# Patient Record
Sex: Male | Born: 1960 | Race: White | Hispanic: No | Smoking: Current every day smoker
Health system: Southern US, Community
[De-identification: ages and names within clinical notes are randomized; demographics above are authoritative.]

## PROBLEM LIST (undated history)

## (undated) DIAGNOSIS — E119 Type 2 diabetes mellitus without complications: Secondary | ICD-10-CM

## (undated) DIAGNOSIS — Z8709 Personal history of other diseases of the respiratory system: Secondary | ICD-10-CM

## (undated) DIAGNOSIS — I10 Essential (primary) hypertension: Secondary | ICD-10-CM

## (undated) DIAGNOSIS — F1910 Other psychoactive substance abuse, uncomplicated: Secondary | ICD-10-CM

## (undated) DIAGNOSIS — F1010 Alcohol abuse, uncomplicated: Secondary | ICD-10-CM

## (undated) DIAGNOSIS — L409 Psoriasis, unspecified: Secondary | ICD-10-CM

## (undated) DIAGNOSIS — F32A Depression, unspecified: Secondary | ICD-10-CM

## (undated) HISTORY — PX: OTHER SURGICAL HISTORY: SHX169

## (undated) HISTORY — DX: Other psychoactive substance abuse, uncomplicated: F19.10

## (undated) HISTORY — DX: Depression, unspecified: F32.A

## (undated) HISTORY — DX: Type 2 diabetes mellitus without complications: E11.9

## (undated) HISTORY — DX: Personal history of other diseases of the respiratory system: Z87.09

---

## 2005-04-17 ENCOUNTER — Emergency Department: Payer: Self-pay | Admitting: Emergency Medicine

## 2006-04-13 ENCOUNTER — Other Ambulatory Visit: Payer: Self-pay

## 2006-04-13 ENCOUNTER — Emergency Department: Payer: Self-pay | Admitting: Emergency Medicine

## 2007-02-18 ENCOUNTER — Inpatient Hospital Stay (HOSPITAL_COMMUNITY): Admission: EM | Admit: 2007-02-18 | Discharge: 2007-02-20 | Payer: Self-pay | Admitting: Emergency Medicine

## 2008-03-31 IMAGING — CR DG CHEST 1V PORT
1 series · 1 of 1 positions shown · non-contrast
Comparison: none

REASON FOR EXAM: Syncope  rm # [id number]
COMMENTS:

PROCEDURE:     DXR - DXR PORTABLE CHEST SINGLE VIEW  - April 13, 2006  [DATE]
RESULT:      AP view of the chest shows the lung fields to be clear.   The
heart, mediastinal and osseous structures are normal in appearance.

[view not recorded]
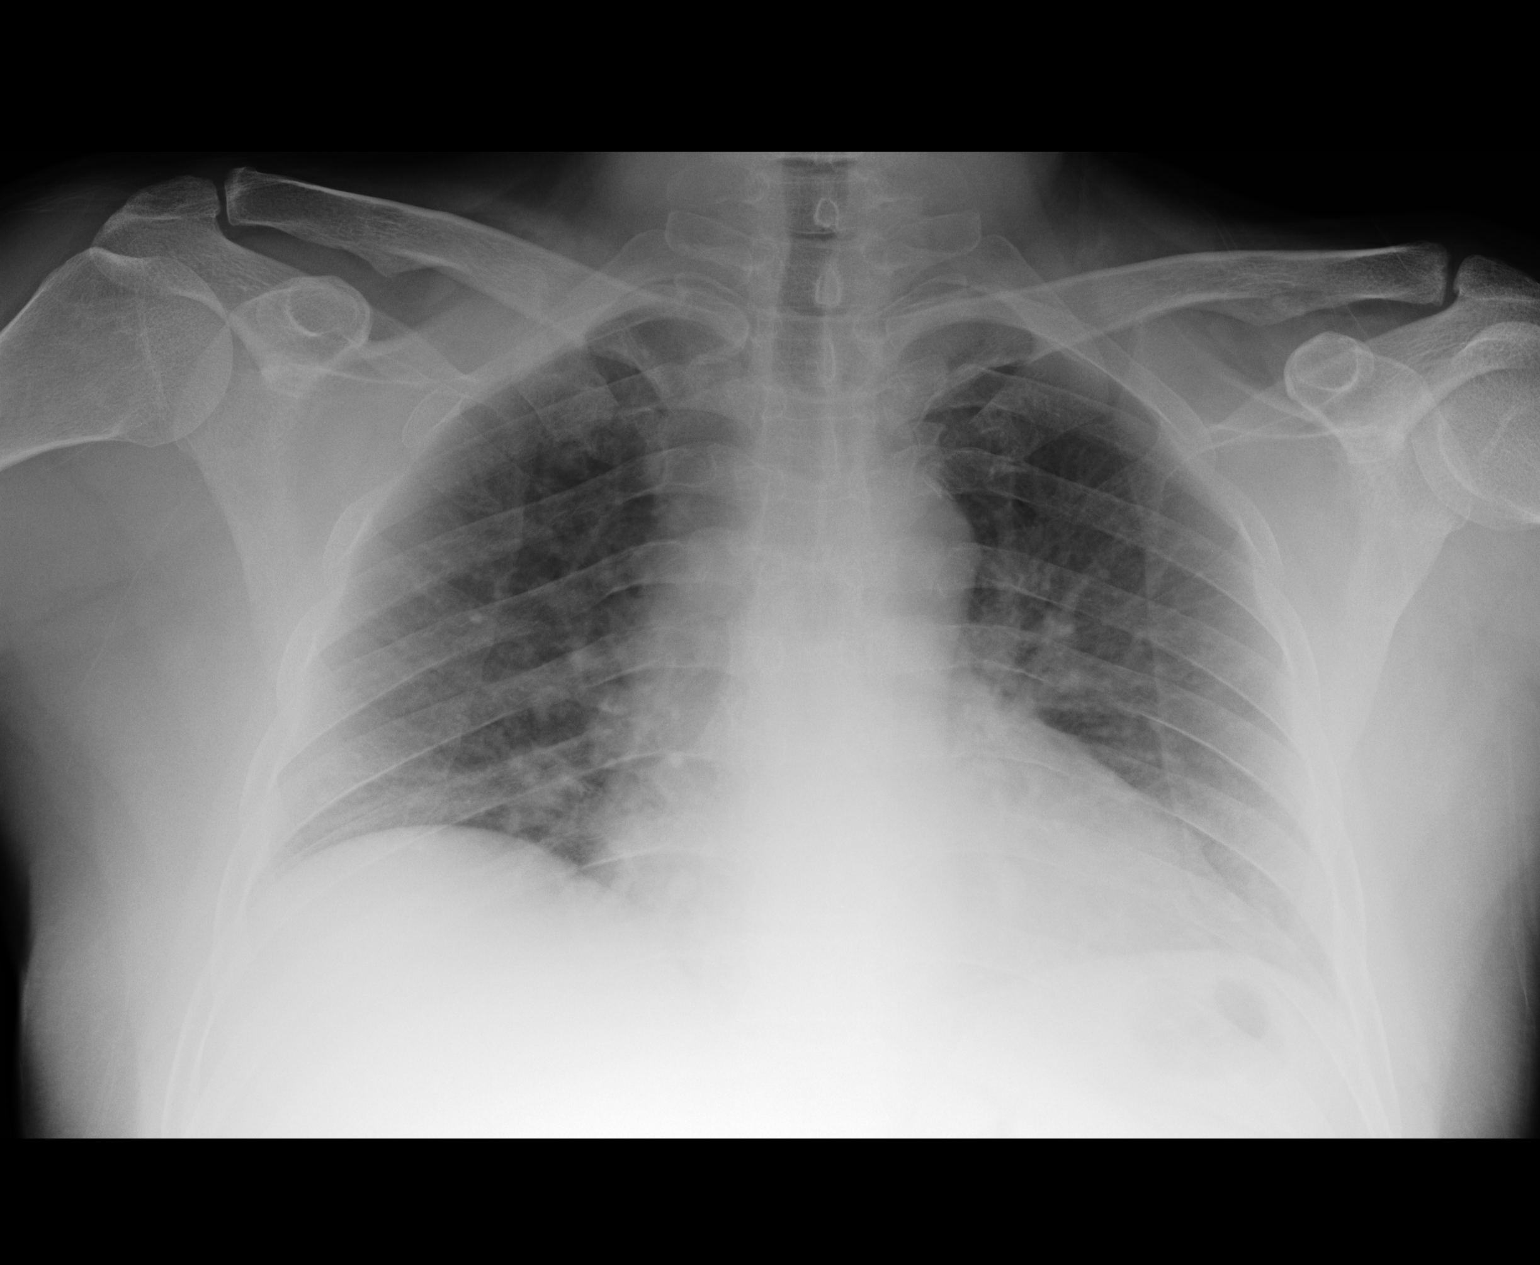

[1 of 1 positions shown; findings below may reference images not displayed]

IMPRESSION: No acute changes are identified.

## 2008-03-31 IMAGING — CT CT HEAD WITHOUT CONTRAST
2 series · 16 of 30 positions shown, 20 images · non-contrast
Comparison: none

REASON FOR EXAM: Syncope
COMMENTS:

[Series 2: without · axial · non-contrast · 0.39mm/px · z∈[-106,+20]mm · 13 of 31 slices shown, 17 images]
[im 3/31  brain]
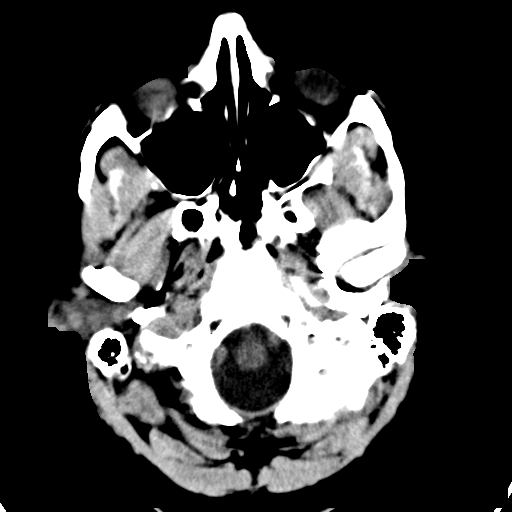
[im 3/31  bone]
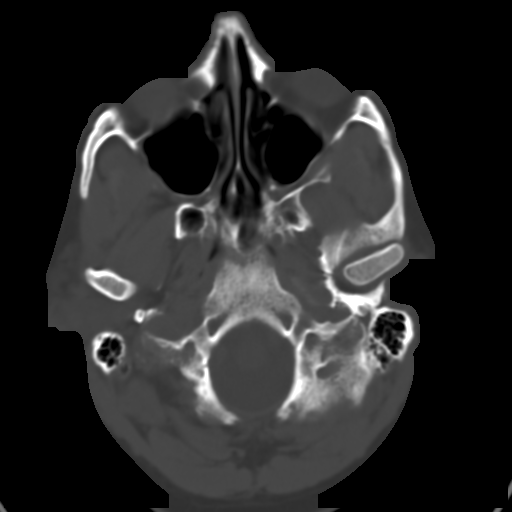
[im 5/31  brain]
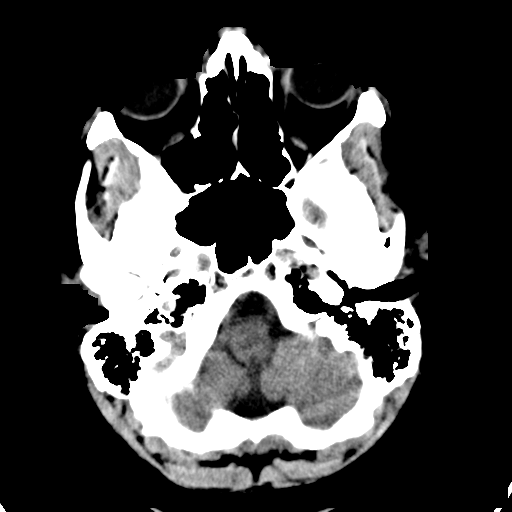
[im 7/31  brain]
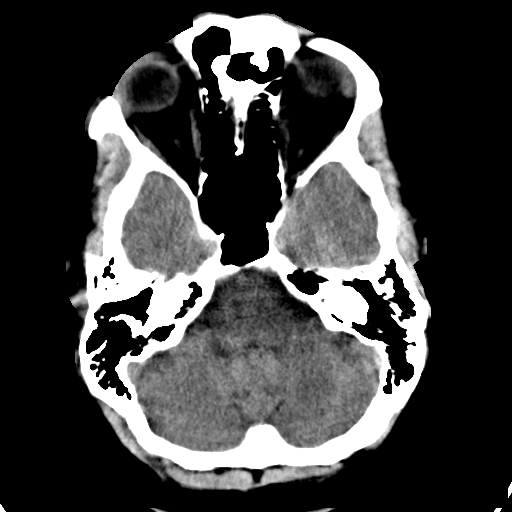
[im 9/31  brain]
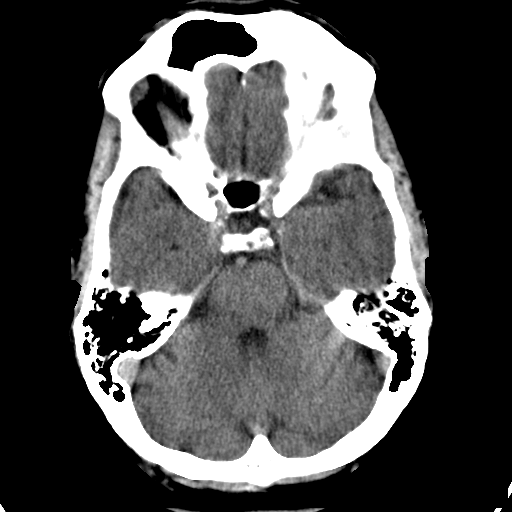
[im 11/31  brain]
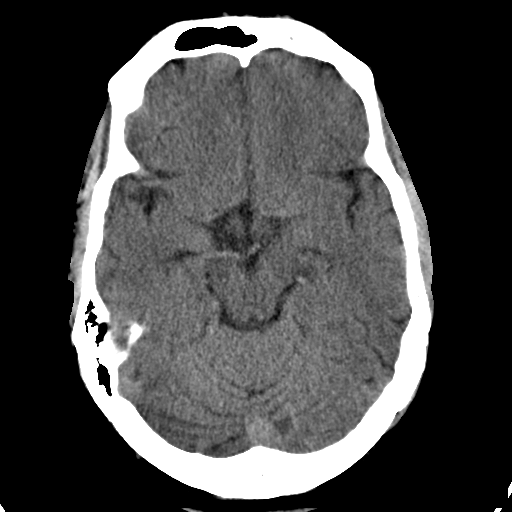
[im 11/31  bone]
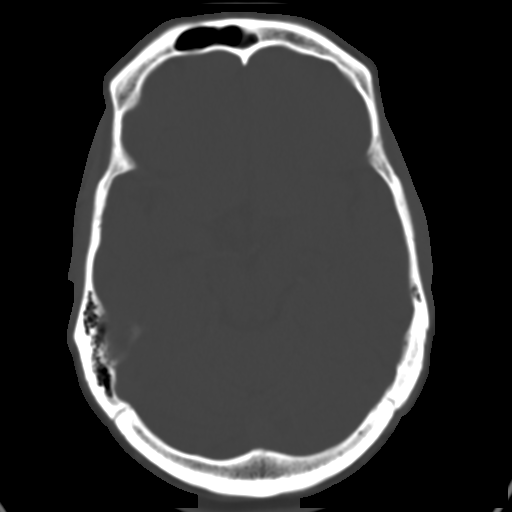
[im 13/31  brain]
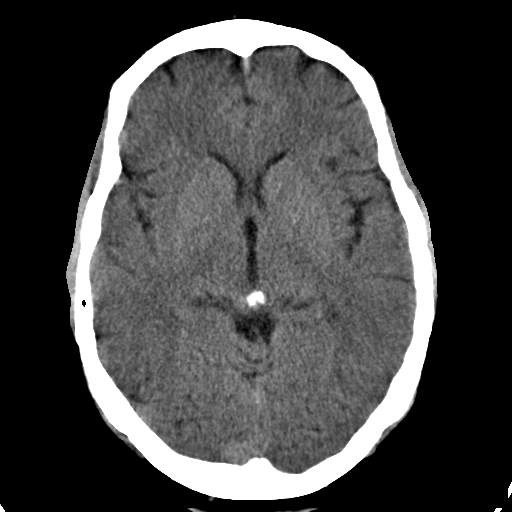
[im 16/31  brain]
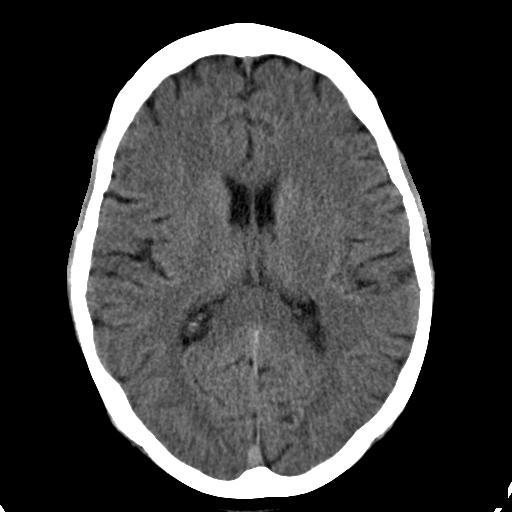
[im 18/31  brain]
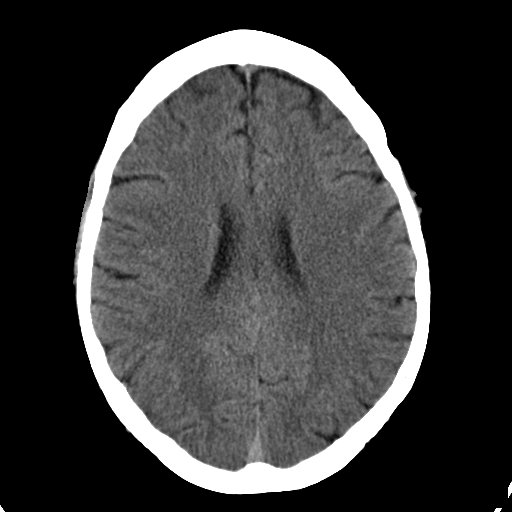
[im 20/31  brain]
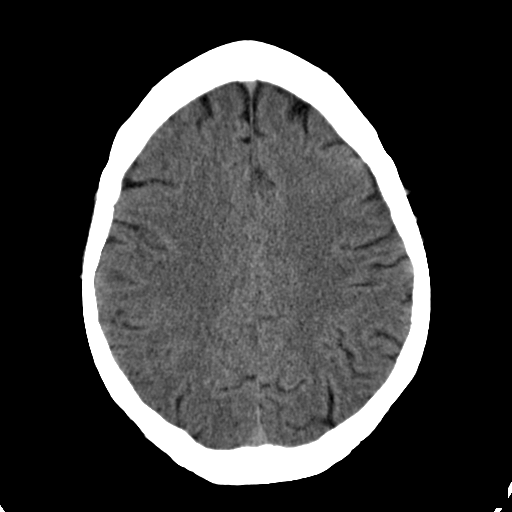
[im 20/31  bone]
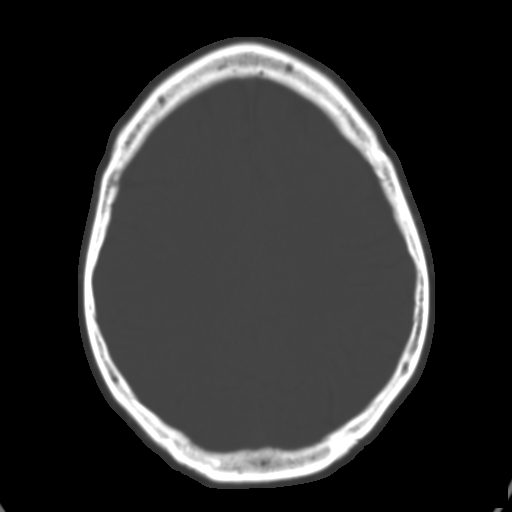
[im 22/31  brain]
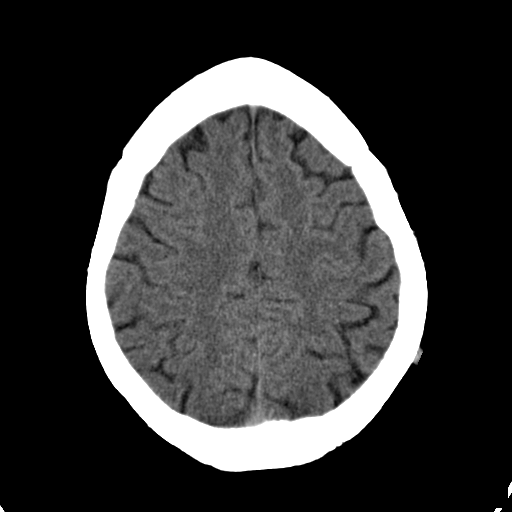
[im 24/31  brain]
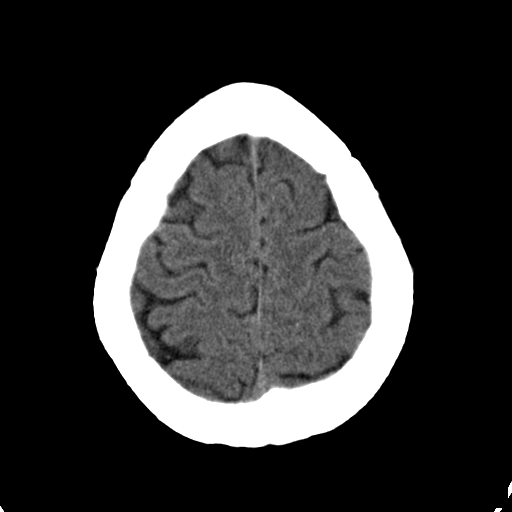
[im 26/31  brain]
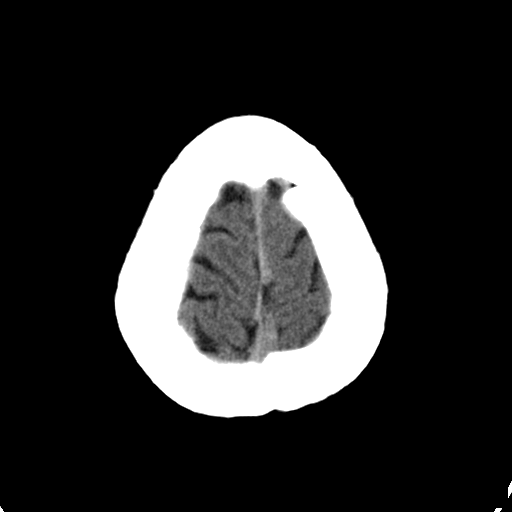
[im 28/31  brain]
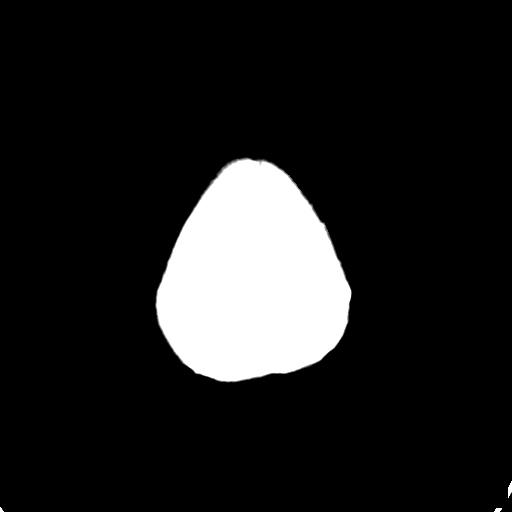
[im 28/31  bone]
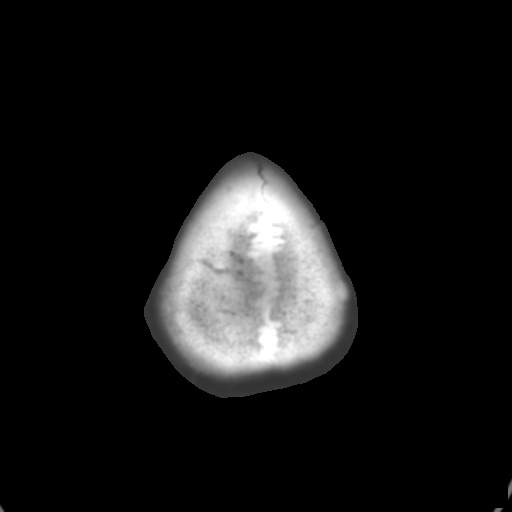

[Series 3: bone · axial · 0.39mm/px · z∈[-106,-66]mm · 3 of 31 slices shown]
[im 3/31  bone]
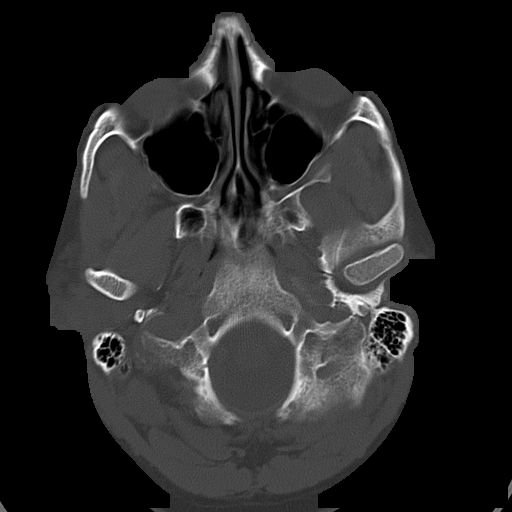
[im 7/31  bone]
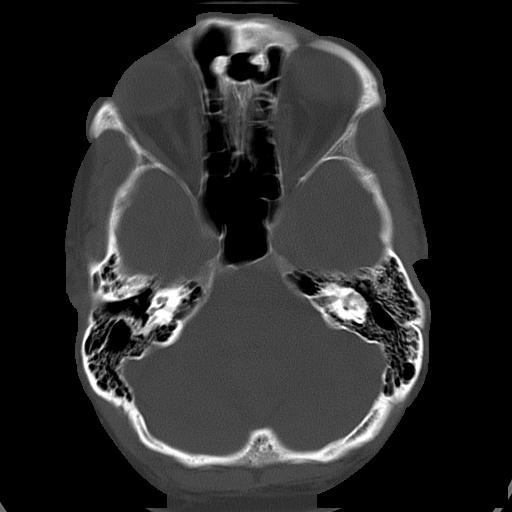
[im 11/31  bone]
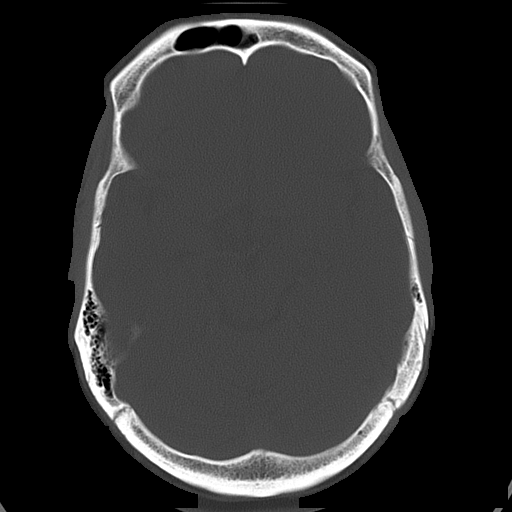

[16 of 30 positions shown; findings below may reference images not displayed]

PROCEDURE:     CT  - CT HEAD WITHOUT CONTRAST  - April 13, 2006  [DATE]

RESULT:     The patient is experiencing mental status change.

The ventricles are normal in size and position. There is no intracranial
hemorrhage, mass or mass effect. At bone window settings, I do not see
evidence of an acute skull fracture.
IMPRESSION: I see no acute intracranial abnormality.

A preliminary report was sent to the Emergency Room at the conclusion of the
study.

## 2008-12-02 ENCOUNTER — Emergency Department (HOSPITAL_COMMUNITY): Admission: EM | Admit: 2008-12-02 | Discharge: 2008-12-03 | Payer: Self-pay | Admitting: Emergency Medicine

## 2008-12-12 ENCOUNTER — Inpatient Hospital Stay (HOSPITAL_COMMUNITY): Admission: EM | Admit: 2008-12-12 | Discharge: 2008-12-14 | Payer: Self-pay | Admitting: Emergency Medicine

## 2008-12-14 ENCOUNTER — Inpatient Hospital Stay (HOSPITAL_COMMUNITY): Admission: RE | Admit: 2008-12-14 | Discharge: 2008-12-18 | Payer: Self-pay | Admitting: *Deleted

## 2008-12-14 ENCOUNTER — Ambulatory Visit: Payer: Self-pay | Admitting: *Deleted

## 2011-03-03 LAB — BASIC METABOLIC PANEL
BUN: 3 mg/dL — ABNORMAL LOW (ref 6–23)
BUN: 5 mg/dL — ABNORMAL LOW (ref 6–23)
CO2: 26 mEq/L (ref 19–32)
CO2: 26 mEq/L (ref 19–32)
CO2: 26 mEq/L (ref 19–32)
Calcium: 8 mg/dL — ABNORMAL LOW (ref 8.4–10.5)
Calcium: 8.3 mg/dL — ABNORMAL LOW (ref 8.4–10.5)
Calcium: 8.8 mg/dL (ref 8.4–10.5)
Chloride: 103 mEq/L (ref 96–112)
Chloride: 105 mEq/L (ref 96–112)
Chloride: 108 mEq/L (ref 96–112)
Chloride: 108 mEq/L (ref 96–112)
Creatinine, Ser: 0.65 mg/dL (ref 0.4–1.5)
Creatinine, Ser: 0.75 mg/dL (ref 0.4–1.5)
Creatinine, Ser: 0.76 mg/dL (ref 0.4–1.5)
Creatinine, Ser: 0.77 mg/dL (ref 0.4–1.5)
GFR calc Af Amer: >60 mL/min (ref >60)
GFR calc Af Amer: >60 mL/min (ref >60)
GFR calc Af Amer: >60 mL/min (ref >60)
GFR calc non Af Amer: >60 mL/min (ref >60)
Glucose, Bld: 75 mg/dL (ref 70–99)
Potassium: 3.3 mEq/L — ABNORMAL LOW (ref 3.5–5.1)
Potassium: 3.7 mEq/L (ref 3.5–5.1)
Sodium: 138 mEq/L (ref 135–145)
Sodium: 139 mEq/L (ref 135–145)
Sodium: 141 mEq/L (ref 135–145)
Sodium: 142 mEq/L (ref 135–145)

## 2011-03-03 LAB — GLUCOSE, CAPILLARY
Glucose-Capillary: 111 mg/dL — ABNORMAL HIGH (ref 70–99)
Glucose-Capillary: 112 mg/dL — ABNORMAL HIGH (ref 70–99)
Glucose-Capillary: 116 mg/dL — ABNORMAL HIGH (ref 70–99)
Glucose-Capillary: 120 mg/dL — ABNORMAL HIGH (ref 70–99)
Glucose-Capillary: 127 mg/dL — ABNORMAL HIGH (ref 70–99)
Glucose-Capillary: 133 mg/dL — ABNORMAL HIGH (ref 70–99)
Glucose-Capillary: 151 mg/dL — ABNORMAL HIGH (ref 70–99)
Glucose-Capillary: 174 mg/dL — ABNORMAL HIGH (ref 70–99)
Glucose-Capillary: 183 mg/dL — ABNORMAL HIGH (ref 70–99)
Glucose-Capillary: 58 mg/dL — ABNORMAL LOW (ref 70–99)
Glucose-Capillary: 62 mg/dL — ABNORMAL LOW (ref 70–99)
Glucose-Capillary: 63 mg/dL — ABNORMAL LOW (ref 70–99)
Glucose-Capillary: 63 mg/dL — ABNORMAL LOW (ref 70–99)
Glucose-Capillary: 64 mg/dL — ABNORMAL LOW (ref 70–99)
Glucose-Capillary: 65 mg/dL — ABNORMAL LOW (ref 70–99)
Glucose-Capillary: 70 mg/dL (ref 70–99)
Glucose-Capillary: 71 mg/dL (ref 70–99)
Glucose-Capillary: 72 mg/dL (ref 70–99)
Glucose-Capillary: 73 mg/dL (ref 70–99)
Glucose-Capillary: 77 mg/dL (ref 70–99)
Glucose-Capillary: 77 mg/dL (ref 70–99)
Glucose-Capillary: 79 mg/dL (ref 70–99)
Glucose-Capillary: 80 mg/dL (ref 70–99)
Glucose-Capillary: 80 mg/dL (ref 70–99)
Glucose-Capillary: 82 mg/dL (ref 70–99)
Glucose-Capillary: 85 mg/dL (ref 70–99)
Glucose-Capillary: 90 mg/dL (ref 70–99)
Glucose-Capillary: 93 mg/dL (ref 70–99)
Glucose-Capillary: 94 mg/dL (ref 70–99)
Glucose-Capillary: 95 mg/dL (ref 70–99)
Glucose-Capillary: 96 mg/dL (ref 70–99)
Glucose-Capillary: 96 mg/dL (ref 70–99)
Glucose-Capillary: 98 mg/dL (ref 70–99)

## 2011-03-03 LAB — SALICYLATE LEVEL: Salicylate Lvl: <4 mg/dL (ref 2.8–20.0)

## 2011-03-03 LAB — DIFFERENTIAL
Basophils Absolute: 0 10*3/uL (ref 0.0–0.1)
Eosinophils Absolute: 0.1 10*3/uL (ref 0.0–0.7)
Eosinophils Relative: 1 % (ref 0–5)
Lymphs Abs: 2.1 10*3/uL (ref 0.7–4.0)

## 2011-03-03 LAB — CBC
Hemoglobin: 11.7 g/dL — ABNORMAL LOW (ref 13.0–17.0)
RBC: 3.68 MIL/uL — ABNORMAL LOW (ref 4.22–5.81)
RBC: 4.23 MIL/uL (ref 4.22–5.81)
WBC: 12.5 10*3/uL — ABNORMAL HIGH (ref 4.0–10.5)
WBC: 9.8 10*3/uL (ref 4.0–10.5)

## 2011-03-03 LAB — COMPREHENSIVE METABOLIC PANEL
ALT: 35 U/L (ref 0–53)
Alkaline Phosphatase: 57 U/L (ref 39–117)
CO2: 25 mEq/L (ref 19–32)
Chloride: 110 mEq/L (ref 96–112)
GFR calc non Af Amer: >60 mL/min (ref >60)
Glucose, Bld: 78 mg/dL (ref 70–99)
Potassium: 3.2 mEq/L — ABNORMAL LOW (ref 3.5–5.1)
Sodium: 145 mEq/L (ref 135–145)

## 2011-03-03 LAB — HEPATIC FUNCTION PANEL
ALT: 31 U/L (ref 0–53)
AST: 18 U/L (ref 0–37)
Albumin: 3.7 g/dL (ref 3.5–5.2)
Bilirubin, Direct: 0.1 mg/dL (ref 0.0–0.3)
Total Bilirubin: 0.7 mg/dL (ref 0.3–1.2)

## 2011-03-03 LAB — RAPID URINE DRUG SCREEN, HOSP PERFORMED
Cocaine: NOT DETECTED
Tetrahydrocannabinol: NOT DETECTED

## 2011-03-03 LAB — ACETAMINOPHEN LEVEL

## 2011-04-01 NOTE — H&P (Signed)
NAME:  SALEM, LEMBKE NO.:  [id number]   MEDICAL RECORD NO.:  [id number]          PATIENT TYPE:  IPS   LOCATION:  0302                          [facility]ACILITY[facility]  [facility]   PHYSICIAN:  Jasmine Pang, M.D. DATE OF BIRTH:  1961-02-07   DATE OF ADMISSION:  12/14/2008  DATE OF DISCHARGE:                       PSYCHIATRIC ADMISSION ASSESSMENT   This is a 50 year old divorced male voluntarily admitted on December 14, 2008.   HISTORY OF PRESENT ILLNESS:  The patient is transferred from [facility] after the patient had an intentional overdose, injected himself  with insulin and alcohol use.  The patient was hospitalized for  approximately 2-3 days medically stable and again is here for assessment  of his overdose.  He denies that it was a suicide attempt.  He was  hoping to use his mother's insulin to help with withdrawal symptoms  after stopping alcohol.  He reports that he is a binge drinker, goes  long periods of time without drinking then he will drink up to two to  three fifths at a time, drinking approximately three fifths on Sunday  prior to this admission.  He has significant stressors.  He is facing  possible jail time after his third DUI.  He denies any suicidal  ideation, denies any hallucinations.  Wants to get things in order if he  does have to go to jail for his elderly parents.   PAST PSYCHIATRIC HISTORY:  First admission to [facility]  Has been in treatment centers before.  Was on Paxil back in 1995 but had  problems with night sweating.   SOCIAL HISTORY:  He is a 50 year old divorced male.  He lives in  [place].  He is unemployed for the past 2 years and lives with his  parents.   FAMILY HISTORY:  Is none.   ALCOHOL AND DRUG HISTORY:  The patient smokes cigarettes.  Again, he  binge drinks up to two to three fifths at a time, can drink for hours on  end.  History of withdrawal symptoms.  Denies any seizure activity and  denies  any other recreational drug use.   PRIMARY CARE Jmichael Gille:  None.   MEDICAL PROBLEMS:  History of psoriasis.   MEDICATIONS:  None.   DRUG ALLERGIES:  No known allergies.   PHYSICAL EXAMINATION:  GENERAL:  This appears to be a well-nourished,  middle-aged male.  No noted tremors.  He does have bruising to his  antecubital area which he states is from IVs and blood draws.  Also some  bruising noted to his wrist when the patient was placed in handcuffs.  MENTAL STATUS EXAM:  He is fully alert.  He is cooperative.  He has fair  eye contact.  His speech is clear, normal pace and tone.  The patient's  mood is neutral.  He does appear somewhat sad and depressed.  Thought  processes are coherent.  No evidence of any delusional thinking.  He is  goal directed.  Cognitive function intact.  His memory appears to be  good.  His judgment is fair.  Insight is minimal.  AXIS I:  Mood disorder.  Alcohol dependence.  AXIS II:  Deferred.  AXIS III:  Psoriasis.  AXIS IV:  Problems related to legal system and other psychosocial  problems.  AXIS V:  Current is 35-40.   PLANS:  Contract for safety.  Will have Librium protocol available for  withdrawal symptoms.  Will continue to identify his co-morbidities.  We  will also obtain a liver function.  The patient at this time is  reluctant to begin any other medications and feels that they are not the  answer.  We will also contact his support group for concerns and  support.  Will have trazodone available for sleep.  The patient will be  in the red group.  His tentative length of stay at this time is 3-5  days.      Landry Corporal, N.P.      Jasmine Pang, M.D.  Electronically Signed    JO/MEDQ  D:  12/15/2008  T:  12/15/2008  Job:  [id number]

## 2011-04-01 NOTE — H&P (Signed)
NAME:  Jeffrey Black, Jeffrey Black NO.:  [id number]   MEDICAL RECORD NO.:  [id number]          PATIENT TYPE:  INP   LOCATION:  1233                         FACILITY:  [facility]   PHYSICIAN:  Della Goo, M.D. DATE OF BIRTH:  27-Apr-1961   DATE OF ADMISSION:  12/12/2008  DATE OF DISCHARGE:                              HISTORY & PHYSICAL   CHIEF COMPLAINT:  Overdose.   HISTORY OF PRESENT ILLNESS:  This is a 50 year old male who was brought  emergently to the hospital after being found unresponsive by his mother  with several insulin files which were empty along side of him.[facility] was  called and they administered IV dextrose and brought the patient to the  hospital for admission and treatment.  The patient was alert and  oriented in the emergency department and on his intake interview, he  stated that he was trying to kill himself, he wanted to die, and God is  trying to punish him because God will not let him die.  The patient also  reports drinking heavily as well.  The type of insulin which had been  recovered from the scene was Lantus insulin.  The patient's blood sugars  were found to be 62, then 68 after administration of IV dextrose and  then 189.  The patient was started on D10 IV fluids and blood glucose  levels have been checked serially.   PAST MEDICAL HISTORY:  1. History of alcohol abuse.  2. History of psoriasis.  3. Tobacco abuse.   MEDICATIONS:  At this time, none.   ALLERGIES:  NO KNOWN DRUG ALLERGIES.   SOCIAL HISTORY:  The patient is divorced.  He lives with his parents.  He is also unemployed.  He is a heavy drinker and heavy smoker and he  denies any illicit drug usage.   FAMILY HISTORY:  Unable to obtain.   REVIEW OF SYSTEMS:  Negative except for symptoms of severe depression.   PHYSICAL EXAMINATION FINDINGS:  GENERAL:  This is a severely agitated  and belligerent 50 year old male who is currently in no acute distress.  VITAL SIGNS:   Temperature 97.0, blood pressure 144/96, heart rate 108,  respirations 20.  HEENT:  Normocephalic, atraumatic.  There is no scleral icterus or  conjunctival pallor or injection.  Pupils are equally round reactive to  light.  Extraocular movements are intact.  Funduscopic benign.  Nares  are patent bilaterally.  Oropharynx is clear.  NECK:  Supple full range of motion.  No thyromegaly, adenopathy or  jugular venous distention.  CARDIOVASCULAR:  Tachycardiac rate and rhythm.  No murmurs, gallops or  rubs.  LUNGS:  Clear to auscultation bilaterally.  ABDOMEN:  Positive bowel sounds, soft, nontender, nondistended.  No  hepatosplenomegaly.  EXTREMITIES:  Without cyanosis, clubbing or edema.  NEUROLOGIC:  The patient is alert and oriented x3.  There are no  deficits in his sensorium.  His cranial nerves are intact.  Motor and  sensory function are also intact.  He is able to move all fours  extremities.   LABORATORY STUDIES:  White blood cell count 12.5, hemoglobin  13.5,  hematocrit 40.4, platelets 370, neutrophils 74% lymphocytes 17%.  Sodium  145, potassium 2.2, chloride 110, bicarb 25, BUN 8, creatinine 0.76 and  glucose 78, albumin 4.1, alcohol level 379, glucose level as mentioned  above.   ASSESSMENT:  A 50 year old male being admitted with:  1. Insulin overdose.  2. Suicide attempt.  3. Mild hypokalemia.  4. Alcoholism.  5. Severe depression.   PLAN:  The patient will be admitted to the intensive care unit.  He will  continue on D10 IV and potassium repletion has been ordered as well.  The patient will be placed on suicide precautions and a psychiatric  evaluation will be requested.  The patient's alcohol level will be  rechecked and when the alcohol level is less than 5 or if he has  symptoms of agitation, the IV Ativan alcohol withdrawal protocol will be  started.  The patient will continue with glucose checks every hour for  the next 24 hours secondary to the duration of  Lantus therapy, and the  patient will also have serial chemistries performed to evaluate his  potassium levels due to his low potassium and also due to the mechanism  of insulin therapy.  Further workup will ensue pending results of the  patient's clinical course.      Della Goo, M.D.  Electronically Signed     HJ/MEDQ  D:  12/14/2008  T:  12/14/2008  Job:  [id number]

## 2011-04-01 NOTE — Consult Note (Signed)
NAME:  Jeffrey Black, Jeffrey Black NO.:  [id number]   MEDICAL RECORD NO.:  [id number]          PATIENT TYPE:  INP   LOCATION:  1233                         FACILITY:  [facility]   PHYSICIAN:  Antonietta Breach, M.D.  DATE OF BIRTH:  [birth year]   DATE OF CONSULTATION:  12/13/2008  DATE OF DISCHARGE:                                 CONSULTATION   REQUESTING PHYSICIAN:  [facility].   REASON FOR CONSULTATION:  Suicide attempt.   HISTORY OF PRESENT ILLNESS:  Mr. Jeffrey Black is a 50 year old male who has  developed over 4 weeks of progressive depressed mood, anhedonia, poor  concentration, insomnia, poor energy, and thoughts of suicide.  He has  lost his job.  He has had a breakup in a relationship.  He has been  staying with his mother and took her insulin and injected himself in  three places in order to kill himself.  He does continue with the same  depression symptoms.  He does have a history of severe binging with  alcohol.   PAST PSYCHIATRIC HISTORY:  He does have a history of prior depressive  episodes and has been treated with Paxil successfully in the past.  However, he had intolerable sweating with Paxil which exacerbated his  psoriasis.   FAMILY PSYCHIATRIC HISTORY:  None known.   SOCIAL HISTORY:  Please see the above.  [name] does have a history  of periods of alcohol withdrawal and did require treatment for  physiologic alcohol dependence in April 2008 at the [facility].  He has one biologic daughter.  He is divorced.  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  However, with the  economy, his company had to cut back and he was let go.   He regained his license but recently lost it again with a DWI.   PAST MEDICAL HISTORY:  Status post insulin intentional overdose.   MEDICATIONS:  He is not on any current psychotropics other than an  Ativan withdrawal protocol and he did present with an alcohol blood  level of 379 after a  liquor binge.  Drug screen negative.  Aspirin  negative.  Tylenol negative.   LABORATORY DATA:  SGOT 27, SGPT 35.  Head CT on January 16 showed no  acute intracranial abnormality.   REVIEW OF SYSTEMS:  Noncontributory.   MENTAL STATUS EXAM:  Jeffrey Black is alert.  His eye contact is good.  His affect is very constricted.  Mood is depressed.  He is oriented to  all spheres.  His memory is intact except for the blackout.  His speech  is normal.  Thought process is logical, coherent, goal-directed.  No  looseness of associations.  Thought content:  He acknowledges suicidal  intent.  He has thoughts of hopelessness and helplessness.  His insight  is intact.  His judgment is poor.   PHYSICAL EXAMINATION:  There are no sweats or tremors.   ASSESSMENT:  AXIS I:  293.83 mood disorder not otherwise specified, rule  out major depressive disorder, recurrent, severe.  Alcohol dependence  AXIS II:  Deferred.  AXIS III:  See past medical history.  AXIS IV:  Economic, occupational, primary support group.  AXIS V:  30.   Jeffrey Black is still at risk for suicide.   RECOMMENDATIONS:  Would proceed with the alcohol withdrawal protocol  using an Ativan taper.  Would continue thiamine 100 mg daily  indefinitely.  The undersigned will defer other psychotropic medication.  Would admit to an inpatient psychiatric hospital as soon as possible for  a dual diagnosis track.      Antonietta Breach, M.D.  Electronically Signed     JW/MEDQ  D:  12/13/2008  T:  12/14/2008  Job:  [id number]

## 2011-04-01 NOTE — Discharge Summary (Signed)
NAME:  Jeffrey Black, Jeffrey Black NO.:  [id number]   MEDICAL RECORD NO.:  [id number]          PATIENT TYPE:  IPS   LOCATION:  0302                          [facility]ACILITY:  [facility]   PHYSICIAN:  Jasmine Pang, M.D. DATE OF BIRTH:  1961/08/06   DATE OF ADMISSION:  12/14/2008  DATE OF DISCHARGE:  12/18/2008                               DISCHARGE SUMMARY   IDENTIFICATION:  This is a 50 year old divorced white male who was  admitted on a voluntary basis on December 14, 2008.   HISTORY OF PRESENT ILLNESS:  The patient was transferred from [facility] after he had an intentional overdose.  He injected himself  with insulin and alcohol use.  The patient was hospitalized for  approximately 2-3 days to medically stabilize.  He was then referred to  our unit for assessment of his mood and overdose.  He denies that it was  a suicide attempt.  He is a binge drinker.  He goes long periods of time  without drinking and will drink up to 2 to 3 fifth at a time.  He was  drinking approximately three-fifths on Sunday prior to his admission.  He has significant stressors.  He is facing possible jail time after his  third DUI.  He denies any suicidal ideation.  He denies any  hallucinations.  He wants to get things in order if he does have to go  to jail for his elderly parents.   PAST PSYCHIATRIC HISTORY:  This is the first admission to [facility].  The  patient has been in treatment centers before he was on Paxil back in  1995, but had problems with the life sweating.   FAMILY HISTORY:  None.   ALCOHOL AND DRUG HISTORY:  The patient smokes cigarettes.  Again, he  binge drinks up to 2-3 fifths at a time.  He states he can drink for  hours on end.  He has a history of withdrawal symptoms.  He denies any  seizure activity.  He denies any other recreational drug use.   MEDICAL PROBLEMS:  History of psoriasis.   MEDICATIONS:  None.   DRUG ALLERGIES:  No known drug allergies.   PHYSICAL  FINDINGS:  There were no acute physical or medical problems  noted.   HOSPITAL COURSE:  Upon admission, the patient was started on trazodone  50 mg p.o. q.h.s. p.r.n. insomnia and the Librium detox protocol.  In  individual sessions with me, the patient was friendly and cooperative.  He also participated appropriately in unit therapeutic groups and  activities.  He stated he took his mother's insulin to be knocked out  so he could get off alcohol.  He did not think it would kill him.  He  had been having thoughts crossing his mind that he might be better off  dead.  He has been on antidepressants in the past, Paxil and Effexor,  but did not like side effects.  He has been worried about his parents  who were getting frail.  He also was struggling with psoriasis.  He was  started on Neurontin 300  mg p.o. t.i.d. for anxiety and Seroquel 50 mg  p.o. q.4 h. p.r.n. anxiety.  He did not want to start any antidepressant  as he did not like the side effects even though I explained to him and  antidepressant would be helpful for his anxiety.  On December 16, 2008,  the patient's mood was good.  He denied any suicidal ideation.  He  continued to talk about his binge drinking.  He states he knows he is in  a rational when he has had too much to drink.  He says anxiety triggers  the binging.  On December 17, 2008, his suicidal ideation continued to be  resolved.  His sleep was good.  He was looking forward to possibly going  home the next day.  He met with the social worker on December 18, 2008 to  finalize his discharge plan.  The patient gave social worker a  permission to call his mother.  She had concerns about relapsing on  alcohol, but agreed to allow the patient to return home with her and she  was going to pick him up.  It was recommended that the patient  participate in 90/90 AA recovery plan and the patient was given a  current meeting schedule.  He was also advised to go to family services   for an appointment with a substance abuse counselor.  He was going for  follow up to the [facility] for his medication management.  On  December 18, 2008, mental status had improved markedly from admission  status.  Sleep was good.  Affect was good.  Mood was less depressed,  less anxious.  Affect was consistent with mood.  There is no suicidal or  homicidal ideation.  No thoughts of self-injurious behavior.  No  auditory or visual hallucinations.  No paranoia or delusions.  Thoughts  were logical and goal-directed.  Thought content, no predominant theme.  Cognitive was grossly intact.  Insight fair.  Judgment fair.  Impulse  control good.  He wants to go home today.  He is safe for discharge.   DISCHARGE DIAGNOSES:  Axis I:  Anxiety disorder, not otherwise  specified; also alcohol dependence.  Axis II:  None.  Axis III:  Psoriasis.  Axis IV:  Severe (problems related to legal system and other  psychosocial problems.  The patient has third DWI and may have to go to  prison.  Because of this, he also will lose his license).  Axis V:  Global assessment of functioning was 50 upon discharge.  GAF  was 35-40 upon admission.  GAF highest past year was 60.   DISCHARGE MEDICATIONS:  There was no specific activity level or dietary  restrictions.   POSTHOSPITAL CARE PLANS:  The patient will go to the [facility] on  February 9th at 9:30 a.m. for follow-up medication management.  He will  also go to [facility] for substance abuse counseling.  As indicated  above, he will go to [employer]  It was recommended he go to 90  meetings in 90 days and he was given a list of meetings in his area.   DISCHARGE MEDICATIONS:  1. Neurontin 300 mg three times a day.  2. Seroquel 50 mg every 40 hours as needed for anxiety.      Jasmine Pang, M.D.  Electronically Signed     BHS/MEDQ  D:  12/18/2008  T:  12/19/2008  Job:  [id number]

## 2011-04-01 NOTE — Discharge Summary (Signed)
NAME:  JULIO, ZAPPIA NO.:  [id number]   MEDICAL RECORD NO.:  [id number]          PATIENT TYPE:  INP   LOCATION:  1503                         FACILITY[facility]  [facility]   PHYSICIAN:  Charlestine Massed, MDDATE OF BIRTH:  [birth year]   DATE OF ADMISSION:  12/12/2008  DATE OF DISCHARGE:                               DISCHARGE SUMMARY   PRIMARY CARE PHYSICIAN:  Unassigned.   REASON FOR ADMISSION:  Overdose with insulin.   HOSPITAL COURSE:  A 50 year old gentleman, Mr. Harvey Matlack, who has  prior history of alcohol abuse, psoriasis and tobacco was found by his  mother unresponsive with several insulin vials empty alongside him.  EMS  was called and they gave IV Dextrose.  The patient was seen in the  emergency department.  He was stating that he was trying to kill  himself.  He wanted to die and God was trying to punish him.  He has a  history of drinking heavily.  The insulin that was recovered from the  site was Lantus insulin which has a half life of 24 to 36 hours.  Blood  sugar was found to be 62 and 68 after IV Dextrose, so the patient was  started on D10 and placed in the ICU with D10 IV Dextrose and several  blood sugar checks and was also put on suicidal precautions.  The  patient was observed in the ICU originally.  His blood sugar values have  been consistently in the normal range with IV Dextrose.  He is not a  known diabetic.  His last set of blood sugars were 139, 129, 151 and 186  and before that, the CBG testing was 139, 127, 151, 183, 193, 186, 197,  174, 112 and 96.  The above mentioned readings were from 11 o'clock  yesterday morning.  The patient was admitted in the early morning of  December 12, 2008 and since then his blood sugars were initially on the  lower side in the 60s to 80s and then started stabilizing in the lower  100s at 9 o'clock on December 13, 2008 and have stayed high until now.  The patient has not had any episodes of any hypoglycemic  attacks after  the blood sugar stabilized and his frequent blood sugar checks have been  continued so far.  The patient was seen by Psychiatry, Dr. Jeanie Sewer,  who agrees with the suicidal attempt and AXIS I diagnosis of mood  disorder, not other specified and to rule out major depressive disorder,  recurrent and severe, with alcohol dependence and he has been deemed  still at risk for suicide and needs inpatient psychiatric hospital  transfer as soon as possible.   The patient was evaluated today and he was calmly answering questions.  He did not have any symptoms of low blood sugar over the last 24 hours.  His blood sugars, as mentioned earlier, have been staying steadily high  so the patient is cleared medically to be discharged to an inpatient  psych facility to continue on Ativan protocol for alcohol detox.   DISCHARGE DIAGNOSES:  1. Suicidal attempt with  acute insulin overdose.  2. Mood disorder, not otherwise specified.  Rule out major depressive      disorder with suicidal attempt.  3. Alcohol dependence.  4. History of tobacco dependence.  5. History of psoriasis.   DISCHARGE MEDICATIONS:  1. Lorazepam/Ativan protocol for alcohol withdrawal.  2. Thiamine 100 mg p.o. daily.  3. Folic acid 1 mg p.o. daily.  4. Nicotine patch 21 mg q. 24 hourly transdermal.  5. Multivitamins 1 tablet p.o. daily.  6. Senna 2 tablets p.o. q.h.s.   DISCHARGE DISPOSITION:  The patient is discharged to an acute inpatient  psychiatric facility for further inpatient psychiatric management.   FOLLOWUP:  Patient to followup with Psychiatry as will be required at  the end of Psychiatric admission.  At the time of discharge, the patient  needs to be set up with a regular primary care physician for general  medical follow up.   Total time required is 45 minutes.      Charlestine Massed, MD  Electronically Signed     UT/MEDQ  D:  12/14/2008  T:  12/14/2008  Job:  [id number]

## 2011-04-04 NOTE — H&P (Signed)
NAME:  Jeffrey Black, Jeffrey Black NO.:  [id number]   MEDICAL RECORD NO.:  [id number]          PATIENT TYPE:  EMS   LOCATION:  ED                           FACILITY:  [facility]   PHYSICIAN:  Elliot Cousin, M.D.    DATE OF BIRTH:  1961-05-18   DATE OF ADMISSION:  02/18/2007  DATE OF DISCHARGE:                              HISTORY & PHYSICAL   PRIMARY CARE PHYSICIAN:  The patient is unassigned.   CHIEF COMPLAINT:  I need detox.   HISTORY OF PRESENT ILLNESS:  The patient is a 50 year old man with a  past medical history significant for alcohol addiction, psoriasis, and  bronchitis, who presents to the emergency department with a desire to  stop drinking.  The patient desires alcohol detoxification.  He states  that he has been binge-drinking for approximately 1-2 weeks nonstop.  He  drinks approximately 1-2 fifths of vodka per day.  He says that the  increase in drinking is a result of a relationship with his girlfriend  that has turned [place].  He has a history of alcohol detoxification many  times he says.  He has been a resident of [facility] in the past for inpatient substance abuse assistance and  detoxification.  The patient denies any illicit drug use; however, he  does smoke 1 pack of cigarettes per day.  He also has a history of  alcohol withdrawal symptoms.  He denies any known history of alcoholic  withdrawal seizures.  He says that he has a high tolerance for  medications usually used for detoxification.  He is aware of all of the  names of the alcohol withdrawal protocols including Ativan, Librium and  Valium.  He has been on Librium, Valium and Ativan at different times in  the past.   During the evaluation in the emergency department, the patient is noted  to be hypertensive with a blood pressure initially of 160/116.  He is  tachycardic with a heart rate of 129 beats per minute.  His alcohol  level is 377.  His urine drug screen is  negative.  The patient will be  admitted for further evaluation and management.   PAST MEDICAL HISTORY:  1. Alcohol abuse/alcohol addiction.  2. History of alcohol detoxification as an inpatient multiple times in      the past.  History of alcohol withdrawal symptoms.  In review of      the E-chart system, the patient has not had any admissions to the      [facility] system.  3. Psoriasis.  4. Bronchitis.  5. Tobacco abuse.   MEDICATIONS:  None.   ALLERGIES:  No known drug allergies.   SOCIAL HISTORY:  The patient is divorced.  He has 1 stepson and 1  biological daughter.  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  He plans on moving in  with his parents.  He has recently broken up with his girlfriend.  He  drinks 1-2 fifths of vodka daily.  He smokes 1 pack of cigarettes per  day.  He denies any illicit drug use.   FAMILY HISTORY:  His mother is 53 years of age and has normal-pressure  hydrocephalus.  His father is 75 years of age and had a stroke recently.   PHYSICAL EXAMINATION:  VITAL SIGNS:  Temperature 98.5, blood pressure  148/104, pulse 129, respiratory rate 20, oxygen saturation 98% on room  air.  GENERAL:  The patient is an alert 50 year old intoxicated Caucasian man  who is currently sitting up in bed in no acute distress.  HEENT:  Head is normocephalic and nontraumatic.  Pupils are equal, round  and reactive to light.  Extraocular movements are intact.  Conjunctivae  are mildly injected.  Sclerae are white.  Nasal mucosa is dry.  Oropharynx reveals mildly dry mucous membranes.  No posterior exudates  or erythema.  His breath smells strongly of alcohol.  NECK:  Supple.  No adenopathy, no thyromegaly, no bruit, no JVD.  LUNGS:  Clear to auscultation bilaterally.  HEART:  S1 and S2 with tachycardia.  ABDOMEN:  Positive bowel sounds.  Soft, nontender and non-distended.  No  hepatosplenomegaly.  No masses palpated.   EXTREMITIES:  Pedal pulses are 2+ bilaterally.  He has multiple scaly  plaques, mildly erythematous, on the pretibial areas of his legs.  No  pedal edema.  NEUROLOGIC:  The patient is alert and oriented x3, although he is  dysarthric and obviously intoxicated.  His gait is mildly unstable.  Cerebellar with finger-to-nose reveals an overshoot bilaterally.  Cranial nerves II-XII otherwise intact.  The patient follows directions  and his strength appears to be 5/5 and his sensation is grossly intact.   ADMISSION LABORATORY DATA:  EKG pending.   WBC 13.1, hemoglobin 14.7, MCV 92.2, platelets 385,000.  Sodium 140,  potassium 3.5, chloride 101, CO2 28, glucose 109, BUN 8, creatinine  0.76, calcium 8.8.  Alcohol level 377.  Urinalysis negative for  leukocyte esterase.  Urine drug screen negative.   ASSESSMENT:  1. Alcohol addiction with active intoxication:  The patient desires      detoxification.  The ACT team attempted to assess the patient;      however, the patient was unreliable and not cooperative.  His      alcohol level is 377.  2. Elevated blood pressure:  The patient's blood pressure may be      elevated secondary to alcohol abuse and/or impending withdrawal.      He has no known history of hypertension.  3. Leukocytosis:  The patient's WBC is 13.1.  He has no signs or      symptoms consistent with infection.  4. Tobacco abuse:  The patient smokes 1 pack of cigarettes per day.   PLAN:  1. The patient will be admitted for further evaluation and management.  2. We will start the IV withdrawal protocol with Ativan.  We will add      p.r.n. Valium as well.  3. Volume repletion with normal saline and potassium chloride added.  4. We will add oral potassium to avoid further decrease.  5. We will start vitamin therapy.  6. We will start Lopressor and/or Clonidine to treat his elevated      blood pressures. 7. We will assess his liver transaminases as well as a TSH and       magnesium level.  8. Psychiatry consultation.  9. Tobacco and substance abuse counseling.      Elliot Cousin, M.D.  Electronically Signed     DF/MEDQ  D:  02/18/2007  T:  02/18/2007  Job:  [id number]

## 2011-04-04 NOTE — Discharge Summary (Signed)
NAME:  Jeffrey Black, Jeffrey Black              ACCOUNT NO.:  [id number]   MEDICAL RECORD NO.:  [id number]          PATIENT TYPE:  INP   LOCATION:  1434                         FACILITY:  [facility]   PHYSICIAN:  Beckey Rutter, MD  DATE OF BIRTH:  08/03/1961   DATE OF ADMISSION:  02/17/2007  DATE OF DISCHARGE:  02/20/2007                               DISCHARGE SUMMARY   CHIEF COMPLAINT UPON ADMISSION:  He came to the ER requesting detox.   HISTORY OF PRESENT ILLNESS:  This is a 50 year old with a past medical  history significant for alcohol addiction, psoriasis and bronchitis.  He  came in with high alcohol intoxification, requesting detox.   HOSPITAL COURSE:  During the hospital course the patient was admitted to  the telemetry floor, but in less than 24 hours the patient started to  become a little shaky with abnormal vital signs -- suggestive of  autonomic hyperactivity.  The patient was then transferred to the  stepdown unit; he stayed there for only 1 day, when he became stable in  terms of autonomic activity and then transferred back to the telemetry  floor.   For the last 24 hours his vital signs remain stable.  He remained calm.  During the hospitalization the patient was seen by psychiatry, Antonietta Breach, M.D.  The patient wanted to resume alcohol detoxification  with a program as outside.  He had a history of admission to [facility] for detox purposes, but now he does not want to do that because of  financial purposes.   DISCHARGE DIAGNOSES:  1. Alcohol intoxification.  2. Alcohol withdrawal symptoms.  3. Alcohol abuse/addiction.  4. Psoriasis.  5. Bronchitis.  6. Tobacco abuse.   DISCHARGE MEDICATIONS:  1. Ativan 2 mg p.o. q.8 h p.r.n. for agitation and shakiness.  2. Atenolol 5 mg p.o. daily.   DISCHARGE CONSULTATION:  The patient was seen by psychiatry, Dr. Antonietta Breach, as discussed above.   DISCHARGE PLANNING:  The patient was encouraged to follow up with  the  rehab as an outpatient, and he has agreeable to that.  He was also  encouraged to stop smoking, and smoking cessation was discussed and  brochure was  provided to him before discharge.   The patient is stable for discharge today, and the patient is aware and  agreeable with the discharge plan.      Beckey Rutter, MD  Electronically Signed     EME/MEDQ  D:  02/20/2007  T:  02/20/2007  Job:  [id number]

## 2011-04-04 NOTE — Consult Note (Signed)
NAME:  [name], [name] NO.:  [id number]   MEDICAL RECORD NO.:  [id number]          PATIENT TYPE:  INP   LOCATION:  1434                         FACILITY:  [facility]   PHYSICIAN:  Antonietta Breach, M.D.  DATE OF BIRTH:  Nov 03, 1961   DATE OF CONSULTATION:  02/19/2007  DATE OF DISCHARGE:  02/20/2007                                 CONSULTATION   REASON FOR CONSULTATION:  Alcoholism, with alcohol physiologic  dependence and withdrawal.   HISTORY OF PRESENT ILLNESS:  Mr. Jeffrey Black is a 50 year old male  admitted to the [facility] on February 17, 2007 with alcohol  withdrawal and severe psoriasis as well as bronchitis.   He did begin to have shakes, tremors, and sweats after stopping his  alcohol.   He had been consuming for a number of weeks approximately 1-2 fifths of  vodka per day. He had been under the stress of having a breakup with a  girlfriend.   He states that this woman had a pattern of being unreliable and that the  breakup was necessary. He came to the hospital specifically for alcohol  detoxification in anticipation of getting back into his recovery  program.  He was also very disturbed by his increase in psoriasis on his  shins, a condition that exacerbate when he drinks alcohol.   Mr. Jeffrey Black does have a normal level of interest as well as hope for the  future.  He has no suicidal or homicidal ideation.  He has no delusions  or hallucinations.  His mood is within normal limits.   PAST PSYCHIATRIC HISTORY:  Mr. Jeffrey Black has no history of major  depression, mania, hallucinations, or delusions.  He has no history of  suicidal ideation or suicide attempts.  He has not been on psychotropic  medication other than that required for alcohol detox.   He does have a history of multiple alcohol detoxifications and alcohol  residential rehabilitation.  He has participated in the 12-step  community for a number of years at a time.   FAMILY PSYCHIATRIC  HISTORY:  None known.   SOCIAL HISTORY:  Mr. Jeffrey Black is divorced.  He has a stepson and a  biological daughter.   He is educated as an Art gallery manager, with a 4-year degree from [employer].  He has worked in Designer, fashion/clothing.   The patient did lose his driver's license with a DWI recently.  He plans  on moving in with his parents until he can get his license back and then  find a new job.  He does not use illegal drugs.   GENERAL MEDICAL PROBLEMS:  As above, the patient has psoriasis and acute  bronchitis.   MEDICATIONS:  The MAR is reviewed.  The patient is on the Ativan detox  protocol, as well as vitamins, including thiamine.   HE HAS NO KNOWN DRUG ALLERGIES.   WBC is 13.1, hemoglobin 14.7, platelets 385.  BUN 7, creatinine 0.78,  SGOT 29, SGPT 39, calcium 8.8, magnesium 2.4.  TSH normal at 0.806.  Urine drug screen is negative.  The alcohol level was 377  while in the  emergency room just prior to admission.   REVIEW OF SYSTEMS:  Noncontributory. The patient currently is afebrile,  with vital signs stable.  He has no and tremor, eyelid tremor, or  sweats.   MENTAL STATUS EXAM:  Mr. Jeffrey Black is an alert middle-aged male appearing  his chronologic age, in no apparent distress.  He is oriented to all  spheres.  His memory is intact to immediate, recent, and remote.  His  fund of knowledge and intelligence are above average.  His speech  involves normal rate and prosody.  His thought process is logical,  coherent, and goal-directed.  No looseness of associations.  Thought  content:  No thoughts of harming himself.  No thoughts of harming  others. No delusions, no hallucinations.  Insight is good.  Judgment is  intact   ASSESSMENT:   AXIS I:  Alcohol dependence, with physiologic dependence.   AXIS II:  Deferred.   AXIS III:  See general medical problems.   AXIS IV:  Primary support group.  Occupational.  Legal (DWI and loss of license).   AXIS V:  55.   Mr.  Jeffrey Black is not at risk to harm himself or others.  He agrees to call  emergency services for any thoughts of harming himself, thoughts of  harming others, or distress.   RECOMMENDATIONS:  1. The undersigned did recommend a residential chemical dependency      rehabilitation program.  However, the patient declined, and he is      not committable. He wants to finish his inpatient have an taper      voluntarily. He wants to follow up with Alcoholics Anonymous      meetings. He plans on living with his parents until he can get a      renewal of his driver's license and get back to work.  He states      that his parents could benefit from his presence in the house for      support and performing errands around the house.  2. Would continue his vitamins indefinitely as an outpatient.   The undersigned provided ego-supportive psychotherapy and 12-step  reinforcement.      Antonietta Breach, M.D.  Electronically Signed     JW/MEDQ  D:  02/25/2007  T:  02/26/2007  Job:  [id number]

## 2012-11-08 ENCOUNTER — Emergency Department (HOSPITAL_COMMUNITY)
Admission: EM | Admit: 2012-11-08 | Discharge: 2012-11-08 | Disposition: A | Discharge status: Home / Self Care | Payer: PRIVATE HEALTH INSURANCE | Attending: Emergency Medicine | Admitting: Emergency Medicine

## 2012-11-08 ENCOUNTER — Emergency Department (HOSPITAL_COMMUNITY): Payer: PRIVATE HEALTH INSURANCE

## 2012-11-08 ENCOUNTER — Encounter (HOSPITAL_COMMUNITY): Payer: Self-pay | Admitting: Emergency Medicine

## 2012-11-08 DIAGNOSIS — R079 Chest pain, unspecified: Secondary | ICD-10-CM

## 2012-11-08 DIAGNOSIS — Z23 Encounter for immunization: Secondary | ICD-10-CM | POA: Insufficient documentation

## 2012-11-08 DIAGNOSIS — F419 Anxiety disorder, unspecified: Secondary | ICD-10-CM

## 2012-11-08 DIAGNOSIS — R0789 Other chest pain: Secondary | ICD-10-CM | POA: Insufficient documentation

## 2012-11-08 DIAGNOSIS — R110 Nausea: Secondary | ICD-10-CM | POA: Insufficient documentation

## 2012-11-08 DIAGNOSIS — R0602 Shortness of breath: Secondary | ICD-10-CM | POA: Insufficient documentation

## 2012-11-08 DIAGNOSIS — R748 Abnormal levels of other serum enzymes: Secondary | ICD-10-CM | POA: Insufficient documentation

## 2012-11-08 DIAGNOSIS — Z872 Personal history of diseases of the skin and subcutaneous tissue: Secondary | ICD-10-CM | POA: Insufficient documentation

## 2012-11-08 DIAGNOSIS — F411 Generalized anxiety disorder: Secondary | ICD-10-CM | POA: Insufficient documentation

## 2012-11-08 DIAGNOSIS — F1010 Alcohol abuse, uncomplicated: Secondary | ICD-10-CM | POA: Insufficient documentation

## 2012-11-08 DIAGNOSIS — F17200 Nicotine dependence, unspecified, uncomplicated: Secondary | ICD-10-CM | POA: Insufficient documentation

## 2012-11-08 DIAGNOSIS — F10929 Alcohol use, unspecified with intoxication, unspecified: Secondary | ICD-10-CM

## 2012-11-08 DIAGNOSIS — K921 Melena: Secondary | ICD-10-CM | POA: Insufficient documentation

## 2012-11-08 HISTORY — DX: Alcohol abuse, uncomplicated: F10.10

## 2012-11-08 HISTORY — DX: Psoriasis, unspecified: L40.9

## 2012-11-08 LAB — COMPREHENSIVE METABOLIC PANEL
ALT: 132 U/L — ABNORMAL HIGH (ref 0–53)
AST: 94 U/L — ABNORMAL HIGH (ref 0–37)
Albumin: 3.8 g/dL (ref 3.5–5.2)
Calcium: 8.8 mg/dL (ref 8.4–10.5)
Sodium: 145 mEq/L (ref 135–145)
Total Protein: 6.9 g/dL (ref 6.0–8.3)

## 2012-11-08 LAB — CBC WITH DIFFERENTIAL/PLATELET
Basophils Absolute: 0 10*3/uL (ref 0.0–0.1)
Eosinophils Absolute: 0.1 10*3/uL (ref 0.0–0.7)
Eosinophils Relative: 2 % (ref 0–5)
Lymphocytes Relative: 13 % (ref 12–46)
MCH: 33.9 pg (ref 26.0–34.0)
MCV: 98.9 fL (ref 78.0–100.0)
Platelets: 127 10*3/uL — ABNORMAL LOW (ref 150–400)
RDW: 12.7 % (ref 11.5–15.5)
WBC: 6.8 10*3/uL (ref 4.0–10.5)

## 2012-11-08 LAB — PRO B NATRIURETIC PEPTIDE: Pro B Natriuretic peptide (BNP): 34.6 pg/mL (ref 0–125)

## 2012-11-08 LAB — OCCULT BLOOD, POC DEVICE: Fecal Occult Bld: NEGATIVE

## 2012-11-08 MED ORDER — LORAZEPAM 1 MG PO TABS
1.0000 mg | ORAL_TABLET | Freq: Four times a day (QID) | ORAL | Status: DC | PRN
  Filled 2012-11-08: qty 1

## 2012-11-08 MED ORDER — LORAZEPAM 1 MG PO TABS
1.0000 mg | ORAL_TABLET | Freq: Once | ORAL | Status: AC
  Administered 2012-11-08: 1 mg via ORAL

## 2012-11-08 MED ORDER — THIAMINE HCL 100 MG/ML IJ SOLN: 100.0000 mg | Freq: Every day | INTRAMUSCULAR | Status: DC

## 2012-11-08 MED ORDER — LORAZEPAM 1 MG PO TABS: 0.0000 mg | ORAL_TABLET | Freq: Two times a day (BID) | ORAL | Status: DC

## 2012-11-08 MED ORDER — TETANUS-DIPHTH-ACELL PERTUSSIS 5-2.5-18.5 LF-MCG/0.5 IM SUSP
0.5000 mL | Freq: Once | INTRAMUSCULAR | Status: AC
  Administered 2012-11-08: 0.5 mL via INTRAMUSCULAR
  Filled 2012-11-08: qty 0.5

## 2012-11-08 MED ORDER — VITAMIN B-1 100 MG PO TABS
100.0000 mg | ORAL_TABLET | Freq: Every day | ORAL | Status: DC
  Administered 2012-11-08: 100 mg via ORAL
  Filled 2012-11-08: qty 1

## 2012-11-08 MED ORDER — GI COCKTAIL ~~LOC~~
30.0000 mL | Freq: Once | ORAL | Status: AC
  Administered 2012-11-08: 30 mL via ORAL
  Filled 2012-11-08: qty 30

## 2012-11-08 MED ORDER — ADULT MULTIVITAMIN W/MINERALS CH
1.0000 | ORAL_TABLET | Freq: Every day | ORAL | Status: DC
  Administered 2012-11-08: 1 via ORAL
  Filled 2012-11-08: qty 1

## 2012-11-08 MED ORDER — LORAZEPAM 2 MG/ML IJ SOLN
1.0000 mg | Freq: Once | INTRAMUSCULAR | Status: AC
  Administered 2012-11-08: 1 mg via INTRAVENOUS
  Filled 2012-11-08: qty 1

## 2012-11-08 MED ORDER — LORAZEPAM 1 MG PO TABS
0.0000 mg | ORAL_TABLET | Freq: Four times a day (QID) | ORAL | Status: DC
  Administered 2012-11-08: 2 mg via ORAL
  Filled 2012-11-08: qty 2

## 2012-11-08 MED ORDER — LORAZEPAM 1 MG PO TABS: 1.0000 mg | ORAL_TABLET | Freq: Three times a day (TID) | ORAL | Status: DC | PRN

## 2012-11-08 MED ORDER — LORAZEPAM 2 MG/ML IJ SOLN: 1.0000 mg | Freq: Four times a day (QID) | INTRAMUSCULAR | Status: DC | PRN

## 2012-11-08 MED ORDER — FOLIC ACID 1 MG PO TABS
1.0000 mg | ORAL_TABLET | Freq: Every day | ORAL | Status: DC
  Administered 2012-11-08: 1 mg via ORAL
  Filled 2012-11-08: qty 1

## 2012-11-08 NOTE — ED Notes (Signed)
[id number]<JY> Expected date:<BR> Expected time:<BR> Means of arrival:<BR> Comments:<BR> Rm 17, Medic 261, 52 M, Chest Pain

## 2012-11-08 NOTE — ED Notes (Signed)
Pt was sitting at home and began having midline dull chest pain. No other associated cardiac s/s. No cardiac hx. Hx of ETOH. Rehab multiple times. Last drink 5h ago and w/d s/s similar to chest pain. NSR, no ectopy.

## 2012-11-08 NOTE — ED Notes (Signed)
Pt states he was sitting at home when he began having a dull constant chest pain. Pain started 6h ago. Pt states he has never felt this kind of pain before. Pt smells of alcohol. Denies n/v, diaphoresis.

## 2012-11-08 NOTE — ED Notes (Signed)
Nitro given enroute. No aspirin d/t hx of GI bleed. Last bleed 2d ago.

## 2012-11-08 NOTE — ED Provider Notes (Signed)
Pt hand off from [employer], PA-C.  Pt intoxicated on arrival and complaining of chest discomfort. Pt seen by Dr. Norlene Campbell as well.  Pt pain sounds atypical and may be due to anxiety, ulcer or musculoskeletal.  Results for orders placed during the hospital encounter of 11/08/12  CBC WITH DIFFERENTIAL      Component Value Range   WBC 6.8  4.0 - 10.5 K/uL   RBC 3.72 (*) 4.22 - 5.81 MIL/uL   Hemoglobin 12.6 (*) 13.0 - 17.0 g/dL   HCT 91.4 (*) 78.2 - 95.6 %   MCV 98.9  78.0 - 100.0 fL   MCH 33.9  26.0 - 34.0 pg   MCHC 34.2  30.0 - 36.0 g/dL   RDW 21.3  08.6 - 57.8 %   Platelets 127 (*) 150 - 400 K/uL   Neutrophils Relative 77  43 - 77 %   Neutro Abs 5.2  1.7 - 7.7 K/uL   Lymphocytes Relative 13  12 - 46 %   Lymphs Abs 0.9  0.7 - 4.0 K/uL   Monocytes Relative 8  3 - 12 %   Monocytes Absolute 0.6  0.1 - 1.0 K/uL   Eosinophils Relative 2  0 - 5 %   Eosinophils Absolute 0.1  0.0 - 0.7 K/uL   Basophils Relative 1  0 - 1 %   Basophils Absolute 0.0  0.0 - 0.1 K/uL  COMPREHENSIVE METABOLIC PANEL      Component Value Range   Sodium 145  135 - 145 mEq/L   Potassium 3.8  3.5 - 5.1 mEq/L   Chloride 107  96 - 112 mEq/L   CO2 25  19 - 32 mEq/L   Glucose, Bld 125 (*) 70 - 99 mg/dL   BUN 11  6 - 23 mg/dL   Creatinine, Ser 4.69  0.50 - 1.35 mg/dL   Calcium 8.8  8.4 - 62.9 mg/dL   Total Protein 6.9  6.0 - 8.3 g/dL   Albumin 3.8  3.5 - 5.2 g/dL   AST 94 (*) 0 - 37 U/L   ALT 132 (*) 0 - 53 U/L   Alkaline Phosphatase 61  39 - 117 U/L   Total Bilirubin 0.2 (*) 0.3 - 1.2 mg/dL   GFR calc non Af Amer >90  >90 mL/min   GFR calc Af Amer >90  >90 mL/min  LIPASE, BLOOD      Component Value Range   Lipase 49  11 - 59 U/L  TROPONIN I      Component Value Range   Troponin I <0.30  <0.30 ng/mL  PRO B NATRIURETIC PEPTIDE      Component Value Range   Pro B Natriuretic peptide (BNP) 34.6  0 - 125 pg/mL  OCCULT BLOOD, POC DEVICE      Component Value Range   Fecal Occult Bld NEGATIVE  NEGATIVE   TROPONIN I      Component Value Range   Troponin I <0.30  <0.30 ng/mL   Dg Chest 2 View  11/08/2012  *RADIOLOGY REPORT*  Clinical Data: Heart palpitations and chest pain for 24 hours.  CHEST - 2 VIEW  Comparison: Chest radiograph performed 02/18/2007  Findings: The lungs are well-aerated and clear.  There is no evidence of focal opacification, pleural effusion or pneumothorax.  The heart is normal in size; the mediastinal contour is within normal limits.  No acute osseous abnormalities are seen.  There is mild apparently chronic wedging involving a lower thoracic vertebral body.  IMPRESSION: No acute cardiopulmonary process seen.   Original Report Authenticated By: Tonia Ghent, M.D.     He has had two negative Troponins and bnp which are WNL. I spoke to him and his father who is present about quitting alcohol and how its dangerous to do cold [place]. He tells me that he is out of all of his alcohol and that his dad wont buy him any. I will give a Rx for Ativan to prevent withdrawal complications.  He was informed that his liver enzymes are elevated. He denies wanting referral information saying "ive got it all and know all the places, I just got to do it". His dad is here to take him home.   Pt has been advised of the symptoms that warrant their return to the ED. Patient has voiced understanding and has agreed to follow-up with the PCP or specialist.   Dorthula Matas, PA 11/08/12 [phone]

## 2012-11-08 NOTE — ED Provider Notes (Signed)
History     CSN: [id number]  Arrival date & time 11/08/12  0403   First MD Initiated Contact with Patient 11/08/12 [phone]      Chief Complaint  Patient presents with  . Chest Pain   HPI  History provided by the patient. Patient is a 51 year old male with history of alcohol dependence and psoriasis who presents with complaints of chest pains. Patient reports developing chest pains this evening and early this morning. He states that he feels his heart is tight with pressure and pain. Symptoms are constant with no specific aggravating or alleviating factors. Patient does also report having increased alcohol use over the past 5 days. He is a daily drinker. Denies any other drug use. Last drink was 10-12 hours ago. Patient also reports feeling some anxiety and tremors. Symptoms have been associated with mild shortness of breath and nausea. He denies any episodes of vomiting or diaphoresis.    Past Medical History  Diagnosis Date  . ETOH abuse   . Psoriasis     History reviewed. No pertinent past surgical history.  Family History  Problem Relation Age of Onset  . Stroke Father     History  Substance Use Topics  . Smoking status: Current Every Day Smoker -- 1.0 packs/day for 20 years    Types: Cigarettes  . Smokeless tobacco: Never Used  . Alcohol Use: Yes     Comment: drinks 1 pt per day      Review of Systems  Constitutional: Negative for fever, chills and diaphoresis.  Respiratory: Positive for shortness of breath.   Cardiovascular: Positive for chest pain. Negative for palpitations and leg swelling.  Gastrointestinal: Positive for nausea and blood in stool. Negative for vomiting, abdominal pain, diarrhea and constipation.  All other systems reviewed and are negative.    Allergies  Review of patient's allergies indicates no known allergies.  Home Medications  No current outpatient prescriptions on file.  BP 145/96  Pulse 121  Temp 99 F (37.2 C) (Oral)  Resp 21   SpO2 100%  Physical Exam  Nursing note and vitals reviewed. Constitutional: He appears well-developed and well-nourished. No distress.  HENT:  Head: Normocephalic.       Small superficial abrasion to the chin without active bleeding.  Cardiovascular: Regular rhythm.  Tachycardia present.   No murmur heard. Pulmonary/Chest: Effort normal and breath sounds normal. No respiratory distress. He has no wheezes. He has no rales.  Abdominal: Soft. There is no tenderness. There is no rebound and no guarding.  Genitourinary: Rectum normal. Guaiac negative stool.  Neurological: He is alert.       Mild tremors in bilateral hands  Skin: Skin is warm.       Psoriatic patches diffusely over the skin of extremities  Psychiatric: He has a normal mood and affect. His behavior is normal.    ED Course  Procedures   Results for orders placed during the hospital encounter of 11/08/12  CBC WITH DIFFERENTIAL      Component Value Range   WBC 6.8  4.0 - 10.5 K/uL   RBC 3.72 (*) 4.22 - 5.81 MIL/uL   Hemoglobin 12.6 (*) 13.0 - 17.0 g/dL   HCT 11.9 (*) 14.7 - 82.9 %   MCV 98.9  78.0 - 100.0 fL   MCH 33.9  26.0 - 34.0 pg   MCHC 34.2  30.0 - 36.0 g/dL   RDW 56.2  13.0 - 86.5 %   Platelets 127 (*) 150 - 400 K/uL  Neutrophils Relative 77  43 - 77 %   Neutro Abs 5.2  1.7 - 7.7 K/uL   Lymphocytes Relative 13  12 - 46 %   Lymphs Abs 0.9  0.7 - 4.0 K/uL   Monocytes Relative 8  3 - 12 %   Monocytes Absolute 0.6  0.1 - 1.0 K/uL   Eosinophils Relative 2  0 - 5 %   Eosinophils Absolute 0.1  0.0 - 0.7 K/uL   Basophils Relative 1  0 - 1 %   Basophils Absolute 0.0  0.0 - 0.1 K/uL  OCCULT BLOOD, POC DEVICE      Component Value Range   Fecal Occult Bld NEGATIVE  NEGATIVE       Dg Chest 2 View  11/08/2012  *RADIOLOGY REPORT*  Clinical Data: Heart palpitations and chest pain for 24 hours.  CHEST - 2 VIEW  Comparison: Chest radiograph performed 02/18/2007  Findings: The lungs are well-aerated and clear.   There is no evidence of focal opacification, pleural effusion or pneumothorax.  The heart is normal in size; the mediastinal contour is within normal limits.  No acute osseous abnormalities are seen.  There is mild apparently chronic wedging involving a lower thoracic vertebral body.  IMPRESSION: No acute cardiopulmonary process seen.   Original Report Authenticated By: Tonia Ghent, M.D.      No diagnosis found.    MDM  4:30AM patient seen and evaluated. Patient does not appear in any acute distress but does appear uncomfortable. He has mild tremors in hands bilaterally. Appears anxious.   Patient also reports having some occasional episodes of bright red blood per rectum. He states he has not had any recently but this tends to come towards end of the week of drinking.  Patient discussed with attending physician. Current workup in place. She agrees with current plans and treatment.  Patient discussed in sign out with Ellin Saba PAC. She will continue to follow patient's lab tests and reevaluate symptoms.     Date: 11/08/2012  Rate: 119  Rhythm: sinus tachycardia  QRS Axis: normal  Intervals: normal  ST/T Wave abnormalities: normal  Conduction Disutrbances:none  Narrative Interpretation:   Old EKG Reviewed: unchanged from 02/18/2007    Angus Seller, PA 11/08/12 [phone]

## 2012-11-08 NOTE — ED Provider Notes (Signed)
Medical screening examination/treatment/procedure(s) were performed by non-physician practitioner and as supervising physician I was immediately available for consultation/collaboration.  Olivia Mackie, MD 11/08/12 [phone]

## 2012-11-15 NOTE — ED Provider Notes (Signed)
Medical screening examination/treatment/procedure(s) were conducted as a shared visit with non-physician practitioner(s) and myself.  I personally evaluated the patient during the encounter  Olivia Mackie, MD 11/15/12 [id number]

## 2013-02-21 ENCOUNTER — Inpatient Hospital Stay (HOSPITAL_COMMUNITY)
Admission: EM | Admit: 2013-02-21 | Discharge: 2013-02-28 | DRG: 897 | Disposition: A | Discharge status: Other Acute Inpatient Hospital | Payer: PRIVATE HEALTH INSURANCE | Attending: Internal Medicine | Admitting: Internal Medicine

## 2013-02-21 ENCOUNTER — Encounter (HOSPITAL_COMMUNITY): Payer: Self-pay | Admitting: *Deleted

## 2013-02-21 DIAGNOSIS — E876 Hypokalemia: Secondary | ICD-10-CM | POA: Diagnosis present

## 2013-02-21 DIAGNOSIS — R748 Abnormal levels of other serum enzymes: Secondary | ICD-10-CM | POA: Diagnosis present

## 2013-02-21 DIAGNOSIS — R7402 Elevation of levels of lactic acid dehydrogenase (LDH): Secondary | ICD-10-CM | POA: Diagnosis present

## 2013-02-21 DIAGNOSIS — IMO0001 Reserved for inherently not codable concepts without codable children: Secondary | ICD-10-CM

## 2013-02-21 DIAGNOSIS — F10239 Alcohol dependence with withdrawal, unspecified: Secondary | ICD-10-CM | POA: Diagnosis present

## 2013-02-21 DIAGNOSIS — I498 Other specified cardiac arrhythmias: Secondary | ICD-10-CM | POA: Diagnosis present

## 2013-02-21 DIAGNOSIS — F3289 Other specified depressive episodes: Secondary | ICD-10-CM | POA: Diagnosis present

## 2013-02-21 DIAGNOSIS — F329 Major depressive disorder, single episode, unspecified: Secondary | ICD-10-CM

## 2013-02-21 DIAGNOSIS — L408 Other psoriasis: Secondary | ICD-10-CM | POA: Diagnosis present

## 2013-02-21 DIAGNOSIS — D649 Anemia, unspecified: Secondary | ICD-10-CM | POA: Diagnosis present

## 2013-02-21 DIAGNOSIS — R7401 Elevation of levels of liver transaminase levels: Secondary | ICD-10-CM | POA: Diagnosis present

## 2013-02-21 DIAGNOSIS — F10931 Alcohol use, unspecified with withdrawal delirium: Principal | ICD-10-CM | POA: Diagnosis present

## 2013-02-21 DIAGNOSIS — F1020 Alcohol dependence, uncomplicated: Secondary | ICD-10-CM | POA: Diagnosis present

## 2013-02-21 DIAGNOSIS — F10231 Alcohol dependence with withdrawal delirium: Principal | ICD-10-CM | POA: Diagnosis present

## 2013-02-21 DIAGNOSIS — Z79899 Other long term (current) drug therapy: Secondary | ICD-10-CM

## 2013-02-21 DIAGNOSIS — F10939 Alcohol use, unspecified with withdrawal, unspecified: Secondary | ICD-10-CM

## 2013-02-21 DIAGNOSIS — F17200 Nicotine dependence, unspecified, uncomplicated: Secondary | ICD-10-CM | POA: Diagnosis present

## 2013-02-21 DIAGNOSIS — D696 Thrombocytopenia, unspecified: Secondary | ICD-10-CM | POA: Diagnosis present

## 2013-02-21 DIAGNOSIS — F411 Generalized anxiety disorder: Secondary | ICD-10-CM | POA: Diagnosis present

## 2013-02-21 DIAGNOSIS — E871 Hypo-osmolality and hyponatremia: Secondary | ICD-10-CM | POA: Diagnosis present

## 2013-02-21 DIAGNOSIS — R030 Elevated blood-pressure reading, without diagnosis of hypertension: Secondary | ICD-10-CM | POA: Diagnosis present

## 2013-02-21 LAB — COMPREHENSIVE METABOLIC PANEL
AST: 86 U/L — ABNORMAL HIGH (ref 0–37)
Albumin: 3.8 g/dL (ref 3.5–5.2)
Chloride: 99 mEq/L (ref 96–112)
Creatinine, Ser: 0.63 mg/dL (ref 0.50–1.35)
Potassium: 3.1 mEq/L — ABNORMAL LOW (ref 3.5–5.1)
Total Bilirubin: 0.5 mg/dL (ref 0.3–1.2)
Total Protein: 7.2 g/dL (ref 6.0–8.3)

## 2013-02-21 LAB — SALICYLATE LEVEL: Salicylate Lvl: <2 mg/dL — ABNORMAL LOW (ref 2.8–20.0)

## 2013-02-21 LAB — RAPID URINE DRUG SCREEN, HOSP PERFORMED
Amphetamines: NOT DETECTED
Benzodiazepines: NOT DETECTED
Cocaine: NOT DETECTED

## 2013-02-21 LAB — CBC
Platelets: 79 10*3/uL — ABNORMAL LOW (ref 150–400)
RDW: 13 % (ref 11.5–15.5)
WBC: 10.4 10*3/uL (ref 4.0–10.5)

## 2013-02-21 LAB — ACETAMINOPHEN LEVEL: Acetaminophen (Tylenol), Serum: <15 ug/mL (ref 10–30)

## 2013-02-21 LAB — TROPONIN I: Troponin I: <0.3 ng/mL (ref <0.30)

## 2013-02-21 LAB — ETHANOL: Alcohol, Ethyl (B): 356 mg/dL — ABNORMAL HIGH (ref 0–11)

## 2013-02-21 MED ORDER — LORAZEPAM 2 MG/ML IJ SOLN
2.0000 mg | Freq: Once | INTRAMUSCULAR | Status: AC
  Administered 2013-02-21: 2 mg via INTRAVENOUS

## 2013-02-21 MED ORDER — KETOROLAC TROMETHAMINE 30 MG/ML IJ SOLN
30.0000 mg | Freq: Once | INTRAMUSCULAR | Status: AC
  Administered 2013-02-21: 30 mg via INTRAVENOUS
  Filled 2013-02-21: qty 1

## 2013-02-21 MED ORDER — LORAZEPAM 2 MG/ML IJ SOLN
0.0000 mg | Freq: Four times a day (QID) | INTRAMUSCULAR | Status: DC
  Administered 2013-02-21 – 2013-02-22 (×3): 2 mg via INTRAVENOUS
  Filled 2013-02-21: qty 1

## 2013-02-21 MED ORDER — LORAZEPAM 2 MG/ML IJ SOLN
2.0000 mg | Freq: Once | INTRAMUSCULAR | Status: AC
  Administered 2013-02-21: 2 mg via INTRAVENOUS
  Filled 2013-02-21: qty 1

## 2013-02-21 MED ORDER — THIAMINE HCL 100 MG/ML IJ SOLN
100.0000 mg | Freq: Every day | INTRAMUSCULAR | Status: DC
  Administered 2013-02-21: 100 mg via INTRAVENOUS
  Filled 2013-02-21: qty 2

## 2013-02-21 MED ORDER — ADULT MULTIVITAMIN W/MINERALS CH
1.0000 | ORAL_TABLET | Freq: Every day | ORAL | Status: DC
  Administered 2013-02-21: 1 via ORAL
  Filled 2013-02-21 (×2): qty 1

## 2013-02-21 MED ORDER — LORAZEPAM 2 MG/ML IJ SOLN: 0.0000 mg | Freq: Two times a day (BID) | INTRAMUSCULAR | Status: DC

## 2013-02-21 MED ORDER — SODIUM CHLORIDE 0.9 % IV BOLUS (SEPSIS)
500.0000 mL | Freq: Once | INTRAVENOUS | Status: AC
  Administered 2013-02-21: 500 mL via INTRAVENOUS

## 2013-02-21 MED ORDER — FOLIC ACID 1 MG PO TABS
1.0000 mg | ORAL_TABLET | Freq: Every day | ORAL | Status: DC
  Administered 2013-02-21: 1 mg via ORAL
  Filled 2013-02-21 (×2): qty 1

## 2013-02-21 MED ORDER — HALOPERIDOL LACTATE 5 MG/ML IJ SOLN
1.0000 mg | Freq: Once | INTRAMUSCULAR | Status: AC
  Administered 2013-02-21: 1 mg via INTRAVENOUS
  Filled 2013-02-21: qty 1

## 2013-02-21 MED ORDER — LORAZEPAM 2 MG/ML IJ SOLN
1.0000 mg | Freq: Four times a day (QID) | INTRAMUSCULAR | Status: DC | PRN
  Filled 2013-02-21 (×5): qty 1

## 2013-02-21 MED ORDER — VITAMIN B-1 100 MG PO TABS
100.0000 mg | ORAL_TABLET | Freq: Every day | ORAL | Status: DC
  Filled 2013-02-21: qty 1

## 2013-02-21 MED ORDER — LORAZEPAM 1 MG PO TABS
1.0000 mg | ORAL_TABLET | Freq: Four times a day (QID) | ORAL | Status: DC | PRN
  Administered 2013-02-21: 1 mg via ORAL
  Filled 2013-02-21 (×2): qty 1

## 2013-02-21 NOTE — ED Notes (Signed)
Pt here with family requesting detox; pt found on floor by family; pt with approx 1 gallon liquor a day consumption; pt noted to have matted eyes and appears impaired at present

## 2013-02-21 NOTE — ED Notes (Signed)
Pt soiled clothes given to his sister. Pt gave verbal consent for this. Sister will bring clean clothes tomorrow to keep here for discharge.

## 2013-02-21 NOTE — ED Notes (Signed)
Lawyer, PA notified re: ETOH level

## 2013-02-21 NOTE — ED Notes (Signed)
Pt asleep, will continue to monitor, pts O2 sats are in the low to mid 80s, pt placed on 2L [place] to maintain O 2 sat < 90%

## 2013-02-21 NOTE — ED Notes (Signed)
Pt sitting in bed eating supper. Family at bedside.pt appears to be less anxious.

## 2013-02-21 NOTE — ED Provider Notes (Signed)
Pt with severe shaking, not able to walk or ambulate in the ED.  Pt is complaining of pain in side due to falling several times, requesting IV morphine.  Pt reports has had hallucinations in the past before. Last ETOH still at 356 at 4:55 and severe symptoms now of withdrawal . Will consult medicine for admission for severe withdrawal and risk of DT development.  AST and ALT are mildly elevated.  Pt's mentation is tangential, has difficulty focusing, mildly disoriented in the ED.  Has no PCP.  Impression: Alcoholism Alcohol withdrawal Depression   Gavin Pound. Davidjames Blansett, MD 02/21/13 2245

## 2013-02-21 NOTE — ED Provider Notes (Signed)
History     CSN: [id number]  Arrival date & time 02/21/13  1008   First MD Initiated Contact with Patient 02/21/13 1031      Chief Complaint  Patient presents with  . Medical Clearance  . Alcohol Intoxication    (Consider location/radiation/quality/duration/timing/severity/associated sxs/prior treatment) HPI Patient presents emergency department for alcohol detox.  Family is accompanying the patient.  They are providing history as well.  Patient drinks a gallon of liquor per day.  This has increased over the last couple of weeks from a half-gallon.  Patient, states he's had constant chest pain for the last several days. Patient denies vomiting, nausea, hallucinations, shortness of breath, back pain, dysuria, abdominal pain, or syncope. Past Medical History  Diagnosis Date  . ETOH abuse   . Psoriasis     History reviewed. No pertinent past surgical history.  Family History  Problem Relation Age of Onset  . Stroke Father     History  Substance Use Topics  . Smoking status: Current Every Day Smoker -- 1.00 packs/day for 20 years    Types: Cigarettes  . Smokeless tobacco: Never Used  . Alcohol Use: Yes     Comment: drinks 1 pt per day      Review of Systems All other systems negative except as documented in the HPI. All pertinent positives and negatives as reviewed in the HPI. Allergies  Review of patient's allergies indicates no known allergies.  Home Medications   Current Outpatient Rx  Name  Route  Sig  Dispense  Refill  . omeprazole (PRILOSEC) 20 MG capsule   Oral   Take 20 mg by mouth daily as needed (for acid relux).           BP 136/87  Pulse 105  Temp(Src) 97.4 F (36.3 C) (Oral)  Resp 23  SpO2 86%  Physical Exam  Nursing note and vitals reviewed. Constitutional: He appears well-developed and well-nourished. No distress.  HENT:  Head: Normocephalic and atraumatic.  Eyes: Pupils are equal, round, and reactive to light.  Neck: Normal range of  motion. Neck supple.  Cardiovascular: Normal rate and regular rhythm.   Pulmonary/Chest: Effort normal and breath sounds normal. No respiratory distress.  Abdominal: Soft. Bowel sounds are normal. He exhibits no distension. There is no tenderness.  Neurological: He is alert.  Skin: Skin is warm and dry.    ED Course  Procedures (including critical care time)  Labs Reviewed  CBC - Abnormal; Notable for the following:    RBC 4.15 (*)    HCT 38.7 (*)    Platelets 79 (*)    All other components within normal limits  COMPREHENSIVE METABOLIC PANEL - Abnormal; Notable for the following:    Potassium 3.1 (*)    Calcium 8.3 (*)    AST 86 (*)    ALT 81 (*)    All other components within normal limits  ETHANOL - Abnormal; Notable for the following:    Alcohol, Ethyl (B) 520 (*)    All other components within normal limits  SALICYLATE LEVEL - Abnormal; Notable for the following:    Salicylate Lvl <2.0 (*)    All other components within normal limits  ACETAMINOPHEN LEVEL  TROPONIN I  URINE RAPID DRUG SCREEN (HOSP PERFORMED)   Patient will be evaluated by the ACT Team.  Patient has been otherwise stable other than his high alcohol level   MDM  Coding         Carlyle Dolly, PA-C 02/23/13  0910 

## 2013-02-22 ENCOUNTER — Inpatient Hospital Stay (HOSPITAL_COMMUNITY): Payer: PRIVATE HEALTH INSURANCE

## 2013-02-22 ENCOUNTER — Encounter (HOSPITAL_COMMUNITY): Payer: Self-pay | Admitting: *Deleted

## 2013-02-22 DIAGNOSIS — F329 Major depressive disorder, single episode, unspecified: Secondary | ICD-10-CM

## 2013-02-22 DIAGNOSIS — F10239 Alcohol dependence with withdrawal, unspecified: Secondary | ICD-10-CM | POA: Diagnosis present

## 2013-02-22 DIAGNOSIS — D696 Thrombocytopenia, unspecified: Secondary | ICD-10-CM | POA: Diagnosis present

## 2013-02-22 DIAGNOSIS — E876 Hypokalemia: Secondary | ICD-10-CM | POA: Diagnosis present

## 2013-02-22 DIAGNOSIS — R748 Abnormal levels of other serum enzymes: Secondary | ICD-10-CM | POA: Diagnosis present

## 2013-02-22 DIAGNOSIS — F10939 Alcohol use, unspecified with withdrawal, unspecified: Secondary | ICD-10-CM | POA: Diagnosis present

## 2013-02-22 DIAGNOSIS — F1020 Alcohol dependence, uncomplicated: Secondary | ICD-10-CM

## 2013-02-22 DIAGNOSIS — IMO0001 Reserved for inherently not codable concepts without codable children: Secondary | ICD-10-CM | POA: Diagnosis present

## 2013-02-22 LAB — CBC
HCT: 28.7 % — ABNORMAL LOW (ref 39.0–52.0)
Hemoglobin: 10 g/dL — ABNORMAL LOW (ref 13.0–17.0)
MCV: 93.2 fL (ref 78.0–100.0)
RBC: 3.08 MIL/uL — ABNORMAL LOW (ref 4.22–5.81)
WBC: 6.9 10*3/uL (ref 4.0–10.5)

## 2013-02-22 LAB — COMPREHENSIVE METABOLIC PANEL
Albumin: 2.9 g/dL — ABNORMAL LOW (ref 3.5–5.2)
BUN: 7 mg/dL (ref 6–23)
Calcium: 8 mg/dL — ABNORMAL LOW (ref 8.4–10.5)
Creatinine, Ser: 0.61 mg/dL (ref 0.50–1.35)
Potassium: 3.1 mEq/L — ABNORMAL LOW (ref 3.5–5.1)
Total Protein: 5.5 g/dL — ABNORMAL LOW (ref 6.0–8.3)

## 2013-02-22 LAB — TSH: TSH: 1.056 u[IU]/mL (ref 0.350–4.500)

## 2013-02-22 LAB — URINALYSIS, ROUTINE W REFLEX MICROSCOPIC
Leukocytes, UA: NEGATIVE
Nitrite: NEGATIVE
Specific Gravity, Urine: 1.009 (ref 1.005–1.030)
Urobilinogen, UA: 0.2 mg/dL (ref 0.0–1.0)
pH: 6.5 (ref 5.0–8.0)

## 2013-02-22 LAB — GLUCOSE, CAPILLARY: Glucose-Capillary: 121 mg/dL — ABNORMAL HIGH (ref 70–99)

## 2013-02-22 MED ORDER — MUPIROCIN 2 % EX OINT
1.0000 "application " | TOPICAL_OINTMENT | Freq: Two times a day (BID) | CUTANEOUS | Status: AC
  Administered 2013-02-22 – 2013-02-26 (×10): 1 via NASAL
  Filled 2013-02-22 (×2): qty 22

## 2013-02-22 MED ORDER — FAMOTIDINE 20 MG PO TABS
20.0000 mg | ORAL_TABLET | Freq: Two times a day (BID) | ORAL | Status: DC
  Administered 2013-02-22 – 2013-02-26 (×8): 20 mg via ORAL
  Filled 2013-02-22 (×12): qty 1

## 2013-02-22 MED ORDER — LORAZEPAM 2 MG/ML IJ SOLN
0.0000 mg | Freq: Two times a day (BID) | INTRAMUSCULAR | Status: DC
  Administered 2013-02-22: 2 mg via INTRAVENOUS

## 2013-02-22 MED ORDER — VITAMIN B-1 100 MG PO TABS
100.0000 mg | ORAL_TABLET | Freq: Every day | ORAL | Status: DC
  Administered 2013-02-22: 100 mg via ORAL
  Filled 2013-02-22 (×2): qty 1

## 2013-02-22 MED ORDER — SODIUM CHLORIDE 0.9 % IV SOLN
INTRAVENOUS | Status: AC
  Administered 2013-02-22: 03:00:00 via INTRAVENOUS

## 2013-02-22 MED ORDER — THIAMINE HCL 100 MG/ML IJ SOLN
Freq: Once | INTRAVENOUS | Status: AC
  Administered 2013-02-22: 04:00:00 via INTRAVENOUS
  Filled 2013-02-22: qty 1000

## 2013-02-22 MED ORDER — LORAZEPAM 2 MG/ML IJ SOLN
0.0000 mg | Freq: Four times a day (QID) | INTRAMUSCULAR | Status: DC
  Administered 2013-02-22 (×3): 2 mg via INTRAVENOUS
  Administered 2013-02-23: 4 mg via INTRAVENOUS
  Filled 2013-02-22: qty 2
  Filled 2013-02-22 (×2): qty 1

## 2013-02-22 MED ORDER — THIAMINE HCL 100 MG/ML IJ SOLN
100.0000 mg | Freq: Every day | INTRAMUSCULAR | Status: DC
  Filled 2013-02-22: qty 1

## 2013-02-22 MED ORDER — ACETAMINOPHEN 325 MG PO TABS: 650.0000 mg | ORAL_TABLET | Freq: Four times a day (QID) | ORAL | Status: DC | PRN

## 2013-02-22 MED ORDER — PANTOPRAZOLE SODIUM 40 MG PO TBEC: 40.0000 mg | DELAYED_RELEASE_TABLET | Freq: Every day | ORAL | Status: DC

## 2013-02-22 MED ORDER — ONDANSETRON HCL 4 MG/2ML IJ SOLN: 4.0000 mg | Freq: Four times a day (QID) | INTRAMUSCULAR | Status: DC | PRN

## 2013-02-22 MED ORDER — MAGNESIUM SULFATE 40 MG/ML IJ SOLN: 2.0000 g | Freq: Once | INTRAMUSCULAR | Status: AC

## 2013-02-22 MED ORDER — ACETAMINOPHEN 650 MG RE SUPP
650.0000 mg | Freq: Four times a day (QID) | RECTAL | Status: DC | PRN
  Filled 2013-02-22: qty 1

## 2013-02-22 MED ORDER — PANTOPRAZOLE SODIUM 40 MG IV SOLR
40.0000 mg | INTRAVENOUS | Status: DC
  Administered 2013-02-22: 40 mg via INTRAVENOUS
  Filled 2013-02-22 (×3): qty 40

## 2013-02-22 MED ORDER — ONDANSETRON HCL 4 MG PO TABS: 4.0000 mg | ORAL_TABLET | Freq: Four times a day (QID) | ORAL | Status: DC | PRN

## 2013-02-22 MED ORDER — ADULT MULTIVITAMIN W/MINERALS CH
1.0000 | ORAL_TABLET | Freq: Every day | ORAL | Status: DC
  Administered 2013-02-22: 1 via ORAL
  Filled 2013-02-22 (×2): qty 1

## 2013-02-22 MED ORDER — LORAZEPAM 1 MG PO TABS
1.0000 mg | ORAL_TABLET | Freq: Four times a day (QID) | ORAL | Status: DC | PRN
  Administered 2013-02-22: 1 mg via ORAL
  Filled 2013-02-22: qty 1

## 2013-02-22 MED ORDER — SODIUM CHLORIDE 0.9 % IV SOLN
INTRAVENOUS | Status: DC
  Administered 2013-02-22: 10 mL/h via INTRAVENOUS

## 2013-02-22 MED ORDER — CHLORHEXIDINE GLUCONATE CLOTH 2 % EX PADS
6.0000 | MEDICATED_PAD | Freq: Every day | CUTANEOUS | Status: AC
  Administered 2013-02-23 – 2013-02-27 (×5): 6 via TOPICAL

## 2013-02-22 MED ORDER — LORAZEPAM 2 MG/ML IJ SOLN
1.0000 mg | Freq: Four times a day (QID) | INTRAMUSCULAR | Status: DC | PRN
  Administered 2013-02-22 (×2): 1 mg via INTRAVENOUS
  Filled 2013-02-22 (×4): qty 1

## 2013-02-22 MED ORDER — MAGNESIUM SULFATE 40 MG/ML IJ SOLN
2.0000 g | Freq: Once | INTRAMUSCULAR | Status: AC
  Administered 2013-02-22: 2 g via INTRAVENOUS
  Filled 2013-02-22: qty 50

## 2013-02-22 MED ORDER — POTASSIUM CHLORIDE 10 MEQ/100ML IV SOLN
10.0000 meq | INTRAVENOUS | Status: AC
  Administered 2013-02-22 (×4): 10 meq via INTRAVENOUS
  Filled 2013-02-22: qty 400

## 2013-02-22 MED ORDER — FOLIC ACID 1 MG PO TABS
1.0000 mg | ORAL_TABLET | Freq: Every day | ORAL | Status: DC
  Administered 2013-02-22: 1 mg via ORAL
  Filled 2013-02-22 (×2): qty 1

## 2013-02-22 MED ORDER — MORPHINE SULFATE 2 MG/ML IJ SOLN
1.0000 mg | INTRAMUSCULAR | Status: DC | PRN
  Administered 2013-02-22 – 2013-02-25 (×11): 1 mg via INTRAVENOUS
  Filled 2013-02-22 (×11): qty 1

## 2013-02-22 MED ORDER — NICOTINE 21 MG/24HR TD PT24
21.0000 mg | MEDICATED_PATCH | Freq: Every day | TRANSDERMAL | Status: DC
  Administered 2013-02-22 – 2013-02-28 (×7): 21 mg via TRANSDERMAL
  Filled 2013-02-22 (×7): qty 1

## 2013-02-22 NOTE — Progress Notes (Signed)
[facility] Progress Note [place] TEAM 1 - Stepdown/ICU TEAM   MAKSIM PEREGOY NWG:[id number] DOB: 01-Jul-1961 DOA: 02/21/2013 PCP: No primary provider on file.  Brief narrative/obtained from HPI: Jeffrey Black is a 52 y.o. male with history of alcoholism, who is brought to the ED by his sister for alcohol intoxication. He was evaluated in the ED and was found to have an ETOH level of 520. Patient reports the was in his usual state of health when he began developing tremors and vomiting last night. His symptoms have progressively worsened since then. He also admits to drinking heavily, drinking approximately 3 gallons of vodka over the weekend. Records indicate that he lost his job a few months ago and has become increasingly depressed. He denies any suicidal ideations. He does wish to quit drinking and get help regarding his addiction. He was noted to be significantly tremulous in ED and confused, requiring admission for further management.   Assessment/Plan: Principal Problem:   Alcohol withdrawal syndrome / alcohol abuse -Continues CIWA protocol  Active Problems:   Hypokalemia/hypomagnesemia Continue to replace and recheck in the a.m.    Thrombocytopenia, unspecified -Likely secondary to alcohol abuse    Elevated liver enzymes -Possibly alcoholic hepatitis -Noted to be improved    Depression -Will request a psychiatry eval    Code Status: Full code Family Communication: None Disposition Plan: Continue to follow in step down  Consultants: None  Procedures: None  Antibiotics: None  DVT prophylaxis: SCDs  HPI/Subjective: Patient complains of severe tremors and feeling a little anxious. No hallucinations. Last time when he went through alcohol withdrawal was in his 32s and was quite severe but he did not come to the hospital to be admitted. Right elbow is noted to be swollen but patient does not recall injuring it. He has mild pain in  it.   Objective: Blood pressure 136/88, pulse 107, temperature 99.1 F (37.3 C), temperature source Oral, resp. rate 25, height 5\' 5"  (1.651 m), weight 95.8 kg (211 lb 3.2 oz), SpO2 96.00%.  Intake/Output Summary (Last 24 hours) at 02/22/13 1428 Last data filed at 02/22/13 1400  Gross per 24 hour  Intake   2542 ml  Output   3600 ml  Net  -1058 ml     Exam: General: No acute respiratory distress Lungs: Clear to auscultation bilaterally without wheezes or crackles Cardiovascular: Regular rate and rhythm without murmur gallop or rub normal S1 and S2 Abdomen: Nontender, nondistended, soft, bowel sounds positive, no rebound, no ascites, no appreciable mass Extremities: No significant cyanosis, clubbing, or edema bilateral lower extremities-coarse tremors of hands noted -Swelling with pitting of the right elbow noted-no significant bruising-tenderness is present below the head of the olecranon   Data Reviewed: Basic Metabolic Panel:  Recent Labs Lab 02/21/13 1030 02/22/13 0255  NA 141 136  K 3.1* 3.1*  CL 99 99  CO2 22 23  GLUCOSE 88 84  BUN 12 7  CREATININE 0.63 0.61  CALCIUM 8.3* 8.0*  MG  --  1.2*   Liver Function Tests:  Recent Labs Lab 02/21/13 1030 02/22/13 0255  AST 86* 54*  ALT 81* 55*  ALKPHOS 105 76  BILITOT 0.5 0.8  PROT 7.2 5.5*  ALBUMIN 3.8 2.9*   No results found for this basename: LIPASE, AMYLASE,  in the last 168 hours No results found for this basename: AMMONIA,  in the last 168 hours CBC:  Recent Labs Lab 02/21/13 1030 02/22/13 0255  WBC 10.4 6.9  HGB  13.7 10.0*  HCT 38.7* 28.7*  MCV 93.3 93.2  PLT 79* 52*   Cardiac Enzymes:  Recent Labs Lab 02/21/13 1249  TROPONINI <0.30   BNP (last 3 results)  Recent Labs  11/08/12 0540  PROBNP 34.6   CBG:  Recent Labs Lab 02/22/13 0120 02/22/13 1241  GLUCAP 85 121*    Recent Results (from the past 240 hour(s))  MRSA PCR SCREENING     Status: Abnormal   Collection Time     02/22/13  1:22 AM      Result Value Range Status   MRSA by PCR POSITIVE (*) NEGATIVE Final   Comment:            The GeneXpert MRSA Assay (FDA     approved for NASAL specimens     only), is one component of a     comprehensive MRSA colonization     surveillance program. It is not     intended to diagnose MRSA     infection nor to guide or     monitor treatment for     MRSA infections.     RESULT CALLED TO, READ BACK BY AND VERIFIED WITH:     STOPHEL,A RN [phone] 02/22/13 MITCHELL,L     Studies:  Recent x-ray studies have been reviewed in detail by the Attending Physician  Scheduled Meds:  Scheduled Meds: . famotidine  20 mg Oral BID  . folic acid  1 mg Oral Daily  . LORazepam  0-4 mg Intravenous Q6H   Followed by  . [START ON 02/24/2013] LORazepam  0-4 mg Intravenous Q12H  . multivitamin with minerals  1 tablet Oral Daily  . nicotine  21 mg Transdermal Daily  . thiamine  100 mg Oral Daily   Or  . thiamine  100 mg Intravenous Daily   Continuous Infusions: . sodium chloride 10 mL/hr (02/22/13 1200)    Time spent on care of this patient: 35 min   [facility]  [facility] Office  [phone] Pager - Text Page per Loretha Stapler as per below:  On-Call/Text Page:      [email or web address]      password [email or web address]  If 7PM-7AM, please contact night-coverage [email or web address] Password [email or web address] 02/22/2013, 2:28 PM   LOS: 1 day

## 2013-02-22 NOTE — H&P (Signed)
[facility] History and Physical  DONDRE CATALFAMO ZOX:[id number] DOB: [birth year] DOA: 02/21/2013  Referring physician: Dr. Oletta Lamas, EDP PCP: No primary provider on file.  Specialists:   Chief Complaint: alcohol intoxication  HPI: Jeffrey Black is a 52 y.o. male with history of alcoholism, who is brought to the ED by his sister for alcohol intoxication.  He was evaluated in the ED and was found to have an ETOH level of 520.  Patient reports the was in his usual state of health when he began developing tremors and vomiting last night.  His symptoms have progressively worsened since then.  He also admits to drinking heavily, drinking approximately 3 gallons of vodka over the weekend.  Records indicate that he lost his job a few months ago and has become increasingly depressed.  He denies any suicidal ideations.  He does wish to quit drinking and get help regarding his addiction.  He was noted to be significantly tremulous in ED and confused, requiring admission for further management.  Review of Systems: limited due to patient's mental status.  Pertinent positives as per HPI  Past Medical History  Diagnosis Date  . ETOH abuse   . Psoriasis    History reviewed. No pertinent past surgical history. Social History:  reports that he has been smoking Cigarettes.  He has a 20 pack-year smoking history. He has never used smokeless tobacco. He reports that  drinks alcohol. He reports that he does not use illicit drugs.   No Known Allergies  Family History  Problem Relation Age of Onset  . Stroke Father     Prior to Admission medications   Medication Sig Start Date End Date Taking? Authorizing Provider  omeprazole (PRILOSEC) 20 MG capsule Take 20 mg by mouth daily as needed (for acid relux).   Yes Historical Provider, MD   Physical Exam: Filed Vitals:   02/21/13 2030 02/22/13 0014 02/22/13 0107 02/22/13 0126  BP: 143/87 118/86  132/77  Pulse: 104 95 113 122  Temp:  97.9 F (36.6 C)   99.2 F (37.3 C)  TempSrc:  Oral  Oral  Resp:  18    SpO2:  100%       General:  NAD  Eyes: PERRLA  ENT: mucous membranes are dry  Neck: supple  Cardiovascular: s1, s2, tachycardic  Respiratory:  CTA B  Abdomen: soft, NT, BS+  Skin: deferred  Musculoskeletal: deferred  Psychiatric: alert to place, time, cooperative  Neurologic: grossly intact, non focal, very tremulous  Labs on Admission:  Basic Metabolic Panel:  Recent Labs Lab 02/21/13 1030  NA 141  K 3.1*  CL 99  CO2 22  GLUCOSE 88  BUN 12  CREATININE 0.63  CALCIUM 8.3*   Liver Function Tests:  Recent Labs Lab 02/21/13 1030  AST 86*  ALT 81*  ALKPHOS 105  BILITOT 0.5  PROT 7.2  ALBUMIN 3.8   No results found for this basename: LIPASE, AMYLASE,  in the last 168 hours No results found for this basename: AMMONIA,  in the last 168 hours CBC:  Recent Labs Lab 02/21/13 1030  WBC 10.4  HGB 13.7  HCT 38.7*  MCV 93.3  PLT 79*   Cardiac Enzymes:  Recent Labs Lab 02/21/13 1249  TROPONINI <0.30    BNP (last 3 results)  Recent Labs  11/08/12 0540  PROBNP 34.6   CBG:  Recent Labs Lab 02/22/13 0120  GLUCAP 85    Radiological Exams on Admission: No results found.   Assessment/Plan  Principal Problem:   Alcohol withdrawal syndrome Active Problems:   Alcoholism /alcohol abuse   Hypokalemia   Thrombocytopenia, unspecified   Elevated liver enzymes   Depression   1. Alcohol withdrawal syndrome.  Although patient presented to ED with alcohol intoxication and elevated ETOH level, he is exhibiting signs of alcohol withdrawal.  He is very tremulous, tachycardic, anxious.  CIWA scores have been > 20.  He has been started on alcohol withdrawal protocol with ativan.  He will be monitored in SDU for further deterioration, incase he requires ativan infusion.  Continue IV fluids and banana bag for now 2. Thrombocytopenia.  Likely related to alcohol.  Check chest xray and urinalysis to  rule out infection.  Check blood cultures. 3. Elevated liver enzymes.  Likely due to alcohol.  Continue to follow 4. Hypokalemia, replace 5. Depression.  Would likely benefit from psychiatry/ACT team consultation to assist with further disposition regarding his depression and alcohol abuse.  This can be done once he is medically stabilized.   Code Status: full code Family Communication: no family available at bedside Disposition Plan: to be determined  Time spent:  Leonardo Makris [facility] Pager [phone]  If 7PM-7AM, please contact night-coverage [email or web address] Password [facility] 02/22/2013, 2:39 AM

## 2013-02-23 DIAGNOSIS — F1020 Alcohol dependence, uncomplicated: Secondary | ICD-10-CM

## 2013-02-23 DIAGNOSIS — D696 Thrombocytopenia, unspecified: Secondary | ICD-10-CM

## 2013-02-23 DIAGNOSIS — F10239 Alcohol dependence with withdrawal, unspecified: Secondary | ICD-10-CM

## 2013-02-23 LAB — CBC
MCHC: 35.5 g/dL (ref 30.0–36.0)
Platelets: 40 10*3/uL — ABNORMAL LOW (ref 150–400)
RDW: 12.9 % (ref 11.5–15.5)
WBC: 8.9 10*3/uL (ref 4.0–10.5)

## 2013-02-23 LAB — COMPREHENSIVE METABOLIC PANEL
ALT: 47 U/L (ref 0–53)
Alkaline Phosphatase: 89 U/L (ref 39–117)
BUN: 6 mg/dL (ref 6–23)
CO2: 24 mEq/L (ref 19–32)
GFR calc Af Amer: >90 mL/min (ref >90)
GFR calc non Af Amer: >90 mL/min (ref >90)
Glucose, Bld: 135 mg/dL — ABNORMAL HIGH (ref 70–99)
Potassium: 3.1 mEq/L — ABNORMAL LOW (ref 3.5–5.1)
Sodium: 135 mEq/L (ref 135–145)

## 2013-02-23 MED ORDER — VITAMIN B-1 100 MG PO TABS
100.0000 mg | ORAL_TABLET | Freq: Every day | ORAL | Status: DC
  Filled 2013-02-23: qty 1

## 2013-02-23 MED ORDER — LORAZEPAM 2 MG/ML IJ SOLN
2.0000 mg | Freq: Four times a day (QID) | INTRAMUSCULAR | Status: DC
  Administered 2013-02-23: 2 mg via INTRAVENOUS
  Filled 2013-02-23: qty 1

## 2013-02-23 MED ORDER — POTASSIUM CHLORIDE CRYS ER 20 MEQ PO TBCR
40.0000 meq | EXTENDED_RELEASE_TABLET | Freq: Two times a day (BID) | ORAL | Status: DC
  Administered 2013-02-23: 40 meq via ORAL
  Filled 2013-02-23 (×3): qty 2

## 2013-02-23 MED ORDER — MAGNESIUM SULFATE 40 MG/ML IJ SOLN: 2.0000 g | Freq: Once | INTRAMUSCULAR | Status: DC

## 2013-02-23 MED ORDER — FOLIC ACID 1 MG PO TABS
1.0000 mg | ORAL_TABLET | Freq: Every day | ORAL | Status: DC
  Administered 2013-02-23: 1 mg via ORAL
  Filled 2013-02-23 (×2): qty 1

## 2013-02-23 MED ORDER — FOLIC ACID 5 MG/ML IJ SOLN
1.0000 mg | Freq: Every day | INTRAMUSCULAR | Status: DC
  Filled 2013-02-23: qty 0.2

## 2013-02-23 MED ORDER — HYDRALAZINE HCL 20 MG/ML IJ SOLN
5.0000 mg | Freq: Four times a day (QID) | INTRAMUSCULAR | Status: DC | PRN
  Administered 2013-02-24: via INTRAVENOUS
  Administered 2013-02-25 (×2): 5 mg via INTRAVENOUS
  Filled 2013-02-23 (×3): qty 1

## 2013-02-23 MED ORDER — THIAMINE HCL 100 MG/ML IJ SOLN
100.0000 mg | Freq: Every day | INTRAMUSCULAR | Status: DC
  Administered 2013-02-23: 100 mg via INTRAVENOUS
  Filled 2013-02-23: qty 1

## 2013-02-23 MED ORDER — LORAZEPAM 2 MG/ML IJ SOLN
1.0000 mg | INTRAMUSCULAR | Status: DC | PRN
  Administered 2013-02-23 (×2): 3 mg via INTRAVENOUS
  Filled 2013-02-23 (×2): qty 2

## 2013-02-23 MED ORDER — LORAZEPAM 2 MG/ML IJ SOLN: 1.0000 mg | INTRAMUSCULAR | Status: DC | PRN

## 2013-02-23 MED ORDER — DEXMEDETOMIDINE HCL IN NACL 200 MCG/50ML IV SOLN
0.4000 ug/kg/h | INTRAVENOUS | Status: DC
  Administered 2013-02-23 (×2): 1 ug/kg/h via INTRAVENOUS
  Administered 2013-02-23: 0.4 ug/kg/h via INTRAVENOUS
  Administered 2013-02-23: 1 ug/kg/h via INTRAVENOUS
  Administered 2013-02-23: 0.4 ug/kg/h via INTRAVENOUS
  Administered 2013-02-23: 1 ug/kg/h via INTRAVENOUS
  Administered 2013-02-24 (×3): 1.2 ug/kg/h via INTRAVENOUS
  Filled 2013-02-23 (×8): qty 50

## 2013-02-23 MED ORDER — POTASSIUM CHLORIDE 20 MEQ/15ML (10%) PO LIQD
40.0000 meq | Freq: Two times a day (BID) | ORAL | Status: DC
  Filled 2013-02-23: qty 30

## 2013-02-23 MED ORDER — HYDRALAZINE HCL 20 MG/ML IJ SOLN: 2.0000 mg | Freq: Four times a day (QID) | INTRAMUSCULAR | Status: DC | PRN

## 2013-02-23 NOTE — Progress Notes (Signed)
[facility] Progress Note [name] TEAM 1 - Stepdown/ICU TEAM   Jeffrey Black:[id number] DOB: July 07, 1961 DOA: 02/21/2013 PCP: No primary provider on file.  Brief narrative/obtained from HPI: 52 y.o. male with history of alcoholism, who was brought to the ED by his sister for alcohol intoxication. He was evaluated in the ED and was found to have an ETOH level of 520. Patient reported the was in his usual state of health when he began developing tremors and vomiting 24 hours prior to admission. His symptoms had progressively worsened. He also admitted to drinking approximately 3 gallons of vodka over the weekend. Records indicate that he lost his job and had become increasingly depressed. He denied any suicidal ideation. He endorsed a desire to quit drinking and get help regarding his addiction. He was noted to be significantly tremulous in ED and confused, requiring admission for further management.   Assessment/Plan:  Alcohol withdrawal syndrome / alcohol abuse -CIWA protocol adjusted due to persistent agitation,confusion and tachycardia -despite increases in dosage amount and frequenct W/D remained severe -asked PCCM to initiate Precedex  -family at bedside (including niece who is Charity fundraiser at [employer]) all agree to above plan   Hypokalemia/hypomagnesemia -Continue to replace and recheck prn  Thrombocytopenia, unspecified -secondary to alcohol abuse -platelets have drifted down somewhat since admit  Tachycardia -r/t ETOH w/d--TSH normal  Elevated liver enzymes -Possibly alcoholic hepatitis -Noted to be improved -Possibly related to psoriasis medication he was on prior to admission  Psoriasis -Unable to afford topical agent prior to admission due to loss of job and on [employer] -Question possibility of underlying psoriatic arthritis  Depression  Code Status: Full code Family Communication: None Disposition Plan: SDU Isolation: Contact for MRSA PCR positive  status  Consultants: PCCM  Procedures: None  Antibiotics: None  DVT prophylaxis: SCDs  HPI/Subjective: Patient very hypervigilant and asking multiple questions regarding healthcare maintenance especially in regards to his psoriasis. At times his thought processes are jumbled and confused.  Objective: Blood pressure 133/108, pulse 146, temperature 99 F (37.2 C), temperature source Oral, resp. rate 24, height 5\' 5"  (1.651 m), weight 95.8 kg (211 lb 3.2 oz), SpO2 99.00%.  Intake/Output Summary (Last 24 hours) at 02/23/13 1049 Last data filed at 02/23/13 1000  Gross per 24 hour  Intake   1810 ml  Output   5800 ml  Net  -3990 ml   Exam: General: No acute respiratory distress Lungs: Clear to auscultation bilaterally without wheezes or crackles, RA Cardiovascular: Regular rate and rhythm without murmur gallop or rub normal S1 and S2, very tachycardic with resting heart rates in the 140s and significant diastolic hypertension Abdomen: Nontender, nondistended, soft, bowel sounds positive, no rebound, no ascites, no appreciable mass Extremities: No significant cyanosis, clubbing, or edema bilateral lower extremities-coarse tremors of hands noted Skin: Multiple small psoriatic lesions noted on upper and lower extremities   Data Reviewed: Basic Metabolic Panel:  Recent Labs Lab 02/21/13 1030 02/22/13 0255  NA 141 136  K 3.1* 3.1*  CL 99 99  CO2 22 23  GLUCOSE 88 84  BUN 12 7  CREATININE 0.63 0.61  CALCIUM 8.3* 8.0*  MG  --  1.2*   Liver Function Tests:  Recent Labs Lab 02/21/13 1030 02/22/13 0255  AST 86* 54*  ALT 81* 55*  ALKPHOS 105 76  BILITOT 0.5 0.8  PROT 7.2 5.5*  ALBUMIN 3.8 2.9*   CBC:  Recent Labs Lab 02/21/13 1030 02/22/13 0255  WBC 10.4 6.9  HGB  13.7 10.0*  HCT 38.7* 28.7*  MCV 93.3 93.2  PLT 79* 52*   Cardiac Enzymes:  Recent Labs Lab 02/21/13 1249  TROPONINI <0.30   BNP (last 3 results)  Recent Labs  11/08/12 0540  PROBNP  34.6   CBG:  Recent Labs Lab 02/22/13 0120 02/22/13 1241  GLUCAP 85 121*    Recent Results (from the past 240 hour(s))  MRSA PCR SCREENING     Status: Abnormal   Collection Time    02/22/13  1:22 AM      Result Value Range Status   MRSA by PCR POSITIVE (*) NEGATIVE Final   Comment:            The GeneXpert MRSA Assay (FDA     approved for NASAL specimens     only), is one component of a     comprehensive MRSA colonization     surveillance program. It is not     intended to diagnose MRSA     infection nor to guide or     monitor treatment for     MRSA infections.     RESULT CALLED TO, READ BACK BY AND VERIFIED WITH:     STOPHEL,A RN [phone] 02/22/13 MITCHELL,L     Studies:  Recent x-ray studies have been reviewed in detail by the Attending Physician  Scheduled Meds:  Scheduled Meds: . Chlorhexidine Gluconate Cloth  6 each Topical Q0600  . famotidine  20 mg Oral BID  . folic acid  1 mg Oral Daily  . LORazepam  2 mg Intravenous Q6H  . mupirocin ointment  1 application Nasal BID  . nicotine  21 mg Transdermal Daily  . [START ON 02/24/2013] thiamine  100 mg Oral Daily   Continuous Infusions: . sodium chloride 10 mL/hr (02/22/13 1200)  . dexmedetomidine      Time spent on care of this patient: 35 min   [facility]  [facility] Office  [phone] Pager - Text Page per Loretha Stapler as per below:  On-Call/Text Page:      [email or web address]      password [email or web address]  If 7PM-7AM, please contact night-coverage [email or web address] Password [email or web address] 02/23/2013, 10:49 AM   LOS: 2 days   I have personally examined this patient and reviewed the entire database. I have reviewed the above note, made any necessary editorial changes, and agree with its content.  Lonia Blood, MD [facility]

## 2013-02-23 NOTE — Consult Note (Signed)
PULMONARY  / CRITICAL CARE MEDICINE  Name: KRUZ CHIU MRN: [id number] DOB: Aug 30, 1961    ADMISSION DATE:  02/21/2013 CONSULTATION DATE:  02/24/12  REFERRING MD :  Dr. Sharon Seller PRIMARY SERVICE: [facility]  CHIEF COMPLAINT:  AMS, alcohol detox  BRIEF PATIENT DESCRIPTION:  52 year old man with PMH of anxiety, depression, alcoholism (drinks 1 gallon of alcohol per day) presenting with alcohol intoxication, EthOH level of 520 on admission. Pt requiring Prececex, PCCM consulted.    SIGNIFICANT EVENTS / STUDIES:  4/7 Admitted to [facility] for alcohol detox 4/8 Increasingly agitated despite increases in dosage of Ativan, CIWA of 16 4/9 Started on Precedex drip, CIWA of 1  LINES / TUBES: 4/7 2 PIVs>>  CULTURES: PCR positive MRSA  ANTIBIOTICS: None  HISTORY OF PRESENT ILLNESS:   Mr. Becraft is a 52 year old man with PMH of anxiety disorder, depression, and alcohol abuse with intoxication requiring hospitalizations in the past who was brought to the ED on 4/7 after being found down by his family. The patient drinks one gallon of alcohol per day but three gallons of Vodka over the weekend. He had several episodes of emesis prior to his presentation. On admission, EtOH level of 520. He reported hallucinations and tremors in the past when not drinking. He denies suicidal ideation and expresses the desire to quit drinking alcohol. Since his admission he became increasingly agitated and confused requiring Precedex for his alcohol withdrawal and PCCM was consulted.   PAST MEDICAL HISTORY :  Past Medical History  Diagnosis Date  . ETOH abuse   . Psoriasis    History reviewed. No pertinent past surgical history. Prior to Admission medications   Medication Sig Start Date End Date Taking? Authorizing Provider  omeprazole (PRILOSEC) 20 MG capsule Take 20 mg by mouth daily as needed (for acid relux).   Yes Historical Provider, MD   No Known Allergies  FAMILY HISTORY:  Family History   Problem Relation Age of Onset  . Stroke Father    SOCIAL HISTORY:  reports that he has been smoking Cigarettes.  He has a 20 pack-year smoking history. He has never used smokeless tobacco. He reports that  drinks alcohol. He reports that he does not use illicit drugs.  REVIEW OF SYSTEMS:  Unable to obtain at this time as the patient remains confused but was positive for emesis and chest pain prior to his admission per ED notes.   SUBJECTIVE:   VITAL SIGNS: Temp:  [98.3 F (36.8 C)-99.5 F (37.5 C)] 98.3 F (36.8 C) (04/09 1156) Pulse Rate:  [102-146] 102 (04/09 1200) Resp:  [0-25] 17 (04/09 1200) BP: (131-167)/(88-132) 141/88 mmHg (04/09 1200) SpO2:  [93 %-99 %] 93 % (04/09 1200) HEMODYNAMICS:   VENTILATOR SETTINGS:  NA INTAKE / OUTPUT: Intake/Output     04/08 0701 - 04/09 0700 04/09 0701 - 04/10 0700   P.O. 1582 450   I.V. (mL/kg) 805 (8.4) 60.6 (0.6)   IV Piggyback 150    Total Intake(mL/kg) 2537 (26.5) 510.6 (5.3)   Urine (mL/kg/hr) 6650 (2.9) 650 (0.8)   Total Output 6650 650   Net -4113 -139.4        Urine Occurrence 1 x      PHYSICAL EXAMINATION: General:  Sedated, confused Neuro:  RASS -2, CIWA 1, sedated,  HEENT:  PERLL, right eye with eyelid shut with crusty discharge, right sclera with lateral erythema no active bleeding or discharge  Cardiovascular:  s1 and s2, no M/R/G Lungs:  CTAB, no  increased WOB Abdomen:  Soft, non distended, +BS Extremities: no pitting edema Skin:  No skin breakdown, bruising on left forearm and shoulder, dry bloody scab at knee caps and big toes bilaterally   LABS:  Recent Labs Lab 02/21/13 1030 02/21/13 1249 02/22/13 0255  HGB 13.7  --  10.0*  WBC 10.4  --  6.9  PLT 79*  --  52*  NA 141  --  136  K 3.1*  --  3.1*  CL 99  --  99  CO2 22  --  23  GLUCOSE 88  --  84  BUN 12  --  7  CREATININE 0.63  --  0.61  CALCIUM 8.3*  --  8.0*  MG  --   --  1.2*  AST 86*  --  54*  ALT 81*  --  55*  ALKPHOS 105  --  76   BILITOT 0.5  --  0.8  PROT 7.2  --  5.5*  ALBUMIN 3.8  --  2.9*  INR  --   --  0.97  TROPONINI  --  <0.30  --     Recent Labs Lab 02/22/13 0120 02/22/13 1241  GLUCAP 85 121*    CXR: 4/8 Low lung volumes but no infiltrates  ASSESSMENT / PLAN:  PULMONARY A: No respiratory distress but requiring Precedex, risk for respiratory depression P:   -Continue monitoring O2 status with PRN O2 supplementation for goal sats >92%  CARDIOVASCULAR A: Hypertension, mild - Likely secondary to alcohol withdrawal, no home antihypertensives. -HTN improving with Precedex for agitation     Prolonged QTc - QTc 540 on tele monitor, likely secondary to [facility], Mg and K low, being repleted; consider due to precedex.  P:  -Continue EtOH protocol, will consider hydralazine IV PRN if BP worsens -D/cd Zofran with Qtc -Replete K and Mg per below  RENAL A:  Decreased UOP but still at >0.47ml/kg/hr      Hyperkalemia - on 4/8 K=3.1, likely 2/2 alcohol WD, emesis, and decrease intake per mouth -repleted      Hypomagnesemia - on 4/8 Mg 1.2, repleted but no repeat chemistry  P:   - CMP  - Strict I&Os  GASTROINTESTINAL A:  Emesis -resolved      Elevated LFTs- probably secondary to alcohol abuse. Trending down P:   -D/cd Zofran with prolonged QTc -Regular diet as sedation/agitation allows -CMP  -consider abd [place] to eval hepatic echotexture.   HEMATOLOGIC A:  DVT prophylaxis      Anemia - normocytic, with Hg drop from 13.7 to 10.0. Likely dilutional, NO signs of bleeding      Thrombocytopenia - due to EtOH P:  -CBC trend -SCDs  INFECTIOUS A:  No active issues P:   -CBC in AM -Aspiration precautions while on Precedex  ENDOCRINE A:  No active issues P:   - Chem in AM with gluc  NEUROLOGIC A:  Agitation/Confusion - from alcohol WD. CIWA 16 prior to Precedex. Pt's agitation fluctuates, currently sleeping comforatbly P:   -Continue Precedex  -Continue CIWA with Ativan IV PRN -Continue  thiamine and folic acid -Continue soft restraints   TODAY'S SUMMARY: Continue Precedex, wean as tolerated.   I have personally obtained a history, examined the patient, evaluated laboratory and imaging results, formulated the assessment and plan and placed orders.  CRITICAL CARE: The patient is critically ill with multiple organ systems failure and requires high complexity decision making for assessment and support, frequent evaluation and titration of therapies, application  of advanced monitoring technologies and extensive interpretation of multiple databases. Critical Care Time devoted to patient care services described in this note is 50 minutes.   Levy Pupa, MD, PhD 02/23/2013, 4:54 PM [place] [facility] [phone] or if no answer [phone]

## 2013-02-23 NOTE — ED Provider Notes (Signed)
Medical screening examination/treatment/procedure(s) were performed by non-physician practitioner and as supervising physician I was immediately available for consultation/collaboration.  Lael Wetherbee T Jalon Blackwelder, MD 02/23/13 2303 

## 2013-02-24 ENCOUNTER — Inpatient Hospital Stay (HOSPITAL_COMMUNITY): Payer: PRIVATE HEALTH INSURANCE

## 2013-02-24 DIAGNOSIS — E876 Hypokalemia: Secondary | ICD-10-CM

## 2013-02-24 LAB — CBC
HCT: 32.9 % — ABNORMAL LOW (ref 39.0–52.0)
MCH: 33.1 pg (ref 26.0–34.0)
MCHC: 35.6 g/dL (ref 30.0–36.0)
MCV: 92.9 fL (ref 78.0–100.0)
RDW: 12.8 % (ref 11.5–15.5)
WBC: 9.6 10*3/uL (ref 4.0–10.5)

## 2013-02-24 LAB — COMPREHENSIVE METABOLIC PANEL
ALT: 47 U/L (ref 0–53)
AST: 43 U/L — ABNORMAL HIGH (ref 0–37)
Albumin: 3.1 g/dL — ABNORMAL LOW (ref 3.5–5.2)
Alkaline Phosphatase: 85 U/L (ref 39–117)
Chloride: 100 mEq/L (ref 96–112)
Potassium: 3.1 mEq/L — ABNORMAL LOW (ref 3.5–5.1)
Sodium: 135 mEq/L (ref 135–145)
Total Bilirubin: 1 mg/dL (ref 0.3–1.2)
Total Protein: 6.7 g/dL (ref 6.0–8.3)

## 2013-02-24 LAB — HEMOGLOBIN A1C: Hgb A1c MFr Bld: 6.1 % — ABNORMAL HIGH (ref <5.7)

## 2013-02-24 MED ORDER — LORAZEPAM 2 MG/ML IJ SOLN: 1.0000 mg | INTRAMUSCULAR | Status: DC | PRN

## 2013-02-24 MED ORDER — LORAZEPAM 2 MG/ML IJ SOLN
1.0000 mg | INTRAMUSCULAR | Status: DC | PRN
  Administered 2013-02-24 – 2013-02-25 (×3): 2 mg via INTRAVENOUS
  Filled 2013-02-24 (×3): qty 1

## 2013-02-24 MED ORDER — MAGNESIUM SULFATE 40 MG/ML IJ SOLN
2.0000 g | Freq: Once | INTRAMUSCULAR | Status: AC
  Administered 2013-02-24: 2 g via INTRAVENOUS
  Filled 2013-02-24 (×2): qty 50

## 2013-02-24 MED ORDER — DEXMEDETOMIDINE HCL IN NACL 400 MCG/100ML IV SOLN
0.4000 ug/kg/h | INTRAVENOUS | Status: DC
  Administered 2013-02-24: 0.6 ug/kg/h via INTRAVENOUS
  Administered 2013-02-24: 1.2 ug/kg/h via INTRAVENOUS
  Administered 2013-02-24: 1 ug/kg/h via INTRAVENOUS
  Administered 2013-02-24: 1.2 ug/kg/h via INTRAVENOUS
  Filled 2013-02-24 (×5): qty 100

## 2013-02-24 MED ORDER — POTASSIUM CHLORIDE CRYS ER 20 MEQ PO TBCR
40.0000 meq | EXTENDED_RELEASE_TABLET | Freq: Three times a day (TID) | ORAL | Status: DC
  Administered 2013-02-24 – 2013-02-26 (×6): 40 meq via ORAL
  Filled 2013-02-24 (×9): qty 2

## 2013-02-24 NOTE — Progress Notes (Signed)
[facility] ADULT ICU REPLACEMENT PROTOCOL FOR AM LAB REPLACEMENT ONLY  The patient does not apply for the [facility] Adult ICU Electrolyte Replacment Protocol based on the criteria listed below:   1. Is GFR >/= 40 ml/min? yes  Patient's GFR today is 90 2. Is urine output >/= 0.5 ml/kg/hr for the last 6 hours? no Patient's UOP is 0.4 ml/kg/hr 3. Is BUN < 60 mg/dL? yes  Patient's BUN today is 8 4. Abnormal electrolyte(s):K 3.1  6. If a panic level lab has been reported, has the CCM MD in charge been notified? yes.   Physician:  Dr. Vincente Poli, [place] A 02/24/2013 6:14 AM

## 2013-02-24 NOTE — Progress Notes (Signed)
PULMONARY  / CRITICAL CARE MEDICINE  Name: SEITH AIKEY MRN: [id number] DOB: Mar 17, 1961    ADMISSION DATE:  02/21/2013 CONSULTATION DATE:  02/24/12  REFERRING MD :  Dr. Sharon Seller PRIMARY SERVICE: [facility]  CHIEF COMPLAINT:  AMS, alcohol detox  BRIEF PATIENT DESCRIPTION:  52 year old man with PMH of anxiety, depression, alcoholism (drinks 1 gallon of alcohol per day) presenting with alcohol intoxication, EthOH level of 520 on admission. Pt requiring Prececex, PCCM consulted.    SIGNIFICANT EVENTS / STUDIES:  4/7 Admitted to [facility] for alcohol detox 4/8 Increasingly agitated despite increases in dosage of Ativan, CIWA of 16 4/9 Started on Precedex drip, CIWA of 1 4/9 Precedex at maximum dose, CIWA of 9  LINES / TUBES: 4/7 2 PIVs>>  CULTURES: PCR positive MRSA  ANTIBIOTICS: None  SUBJECTIVE:  Still agitated last night, on max dose of Precedex,  denies SI/HI,  agitation waxes and wanes   VITAL SIGNS: Temp:  [98 F (36.7 C)-99 F (37.2 C)] 98 F (36.7 C) (04/10 0728) Pulse Rate:  [73-146] 73 (04/10 0600) Resp:  [13-26] 15 (04/10 0600) BP: (127-159)/(81-129) 149/103 mmHg (04/10 0600) SpO2:  [93 %-99 %] 96 % (04/10 0600) HEMODYNAMICS:   VENTILATOR SETTINGS:  NA INTAKE / OUTPUT: Intake/Output     04/09 0701 - 04/10 0700 04/10 0701 - 04/11 0700   P.O. 690    I.V. (mL/kg) 619.2 (6.5)    IV Piggyback     Total Intake(mL/kg) 1309.2 (13.7)    Urine (mL/kg/hr) 1575 (0.7)    Total Output 1575     Net -265.8            PHYSICAL EXAMINATION: General:  Resting in bed in NAD Neuro:  RASS 0, CIWA 1, alert and oriented x3 HEENT:  PERLL, right eye with eyelid shut with crusty discharge, right sclera with lateral erythema no active bleeding or discharge  Cardiovascular:  s1 and s2, no M/R/G Lungs:  CTAB, no increased WOB Abdomen:  Soft, non distended, +BS Extremities: no pitting edema Skin:  bruising on left forearm and shoulder, dry bloody scab at knee caps and big  toes bilaterally   LABS:  Recent Labs Lab 02/21/13 1030 02/21/13 1249 02/22/13 0255 02/23/13 1701 02/24/13 0340  HGB 13.7  --  10.0* 11.3* 11.7*  WBC 10.4  --  6.9 8.9 9.6  PLT 79*  --  52* 40* 44*  NA 141  --  136 135 135  K 3.1*  --  3.1* 3.1* 3.1*  CL 99  --  99 100 100  CO2 22  --  23 24 24   GLUCOSE 88  --  84 135* 132*  BUN 12  --  7 6 8   CREATININE 0.63  --  0.61 0.56 0.53  CALCIUM 8.3*  --  8.0* 9.0 9.2  MG  --   --  1.2* 1.9  --   AST 86*  --  54* 42* 43*  ALT 81*  --  55* 47 47  ALKPHOS 105  --  76 89 85  BILITOT 0.5  --  0.8 0.9 1.0  PROT 7.2  --  5.5* 6.7 6.7  ALBUMIN 3.8  --  2.9* 3.2* 3.1*  INR  --   --  0.97  --   --   TROPONINI  --  <0.30  --   --   --     Recent Labs Lab 02/22/13 0120 02/22/13 1241  GLUCAP 85 121*    CXR: 4/8  Low lung volumes but no infiltrates  ASSESSMENT / PLAN:  PULMONARY A: No respiratory distress but requiring Precedex, risk for respiratory depression P:   -Continue monitoring O2 status with PRN O2 supplementation for goal sats >92%  CARDIOVASCULAR A: Hypertension, mild - Likely secondary to alcohol withdrawal, no home antihypertensives. -HTN improving with Precedex for agitation     Prolonged QTc - QTc 540 on tele monitor, likely secondary to [facility] and K (being repleted); consider due to precedex.  P:  -Continue EtOH protocol, will consider hydralazine IV PRN if BP worsens -D/cd Zofran with Qtc -Replete K per below -12 lead EKG  RENAL A:  Decreased UOP but still at >0.23ml/kg/hr      Hyperkalemia - on K=3.1, likely 2/2 alcohol WD, emesis, and decrease intake per mouth -repleted      Hypomagnesemia - Resolved.  P:   - CMP daily, check phos given risk refeeding - Strict I&Os  GASTROINTESTINAL A:  Emesis -resolved      Elevated LFTs- probably secondary to alcohol abuse. Trending down P:   -D/cd Zofran with prolonged QTc -Regular diet as sedation/agitation allows -CMP  -abd [place] to eval hepatic echotexture.    HEMATOLOGIC A:  DVT prophylaxis      Anemia - normocytic, Hg trending up. NO signs of bleeding      Thrombocytopenia - due to EtOH -platelets trending up P:  -CBC trend -SCDs  INFECTIOUS A:  No active issues P:   -CBC in AM -Aspiration precautions while on Precedex  ENDOCRINE A:  No active issues P:   - Chem in AM with gluc  NEUROLOGIC A:  Agitation/Confusion - from alcohol WD. CIWA 16 prior to Precedex. Pt's agitation fluctuates, currently calm P:   -Continue Precedex  -Continue CIWA with Ativan IV PRN -Continue thiamine and folic acid x 3 days -Continue soft restraints if needed -attempt to ambulate -he may be interested in inpatient rehab in [place] - will try to facilitate this w Social Work   TODAY'S SUMMARY: Continue Precedex, wean as tolerated.   I have personally obtained a history, examined the patient, evaluated laboratory and imaging results, formulated the assessment and plan and placed orders.  CRITICAL CARE: The patient is critically ill with multiple organ systems failure and requires high complexity decision making for assessment and support, frequent evaluation and titration of therapies, application of advanced monitoring technologies and extensive interpretation of multiple databases. Critical Care Time devoted to patient care services described in this note is 30 minutes.   Levy Pupa, MD, PhD 02/24/2013, 7:33 AM [place] [facility] [phone] or if no answer [phone]

## 2013-02-24 NOTE — Clinical Social Work Psychosocial (Signed)
Clinical Social Work Department CLINICAL SOCIAL WORK PSYCHIATRY SERVICE LINE ASSESSMENT 02/24/2013  Patient:  Jeffrey Black  Account:  [id number]  Admit Date:  02/21/2013  Clinical Social Worker:  Read Drivers  Date/Time:  02/24/2013 11:51 AM Referred by:  RN  Date referred:  02/24/2013 Reason for Referral  Substance Abuse   Presenting Symptoms/Problems (In the person's/family's own words):   Alcohol intoxication   Abuse/Neglect/Trauma History (check all that apply)  Denies history   Abuse/Neglect/Trauma Comments:   none reported or  noted   Psychiatric History (check all that apply)  Outpatient treatment  Residential treatment   Psychiatric medications:  none reported or noted   Current Mental Health Hospitalizations/Previous Mental Health History:   Pt states that he has been in a residential tx facility - [facility] in 2010.  Pt states that he has also received outpatient tx with [facility] in the same year.  Pt states that he has been to AA meetings in the past as well.  Pt states that he has had an 8 year period of sobriety (1995-2003).   Current provider:   pt denies current provider   Place and Date:   n/a   Current Medications:   Current facility-administered medications:0.9 %  sodium chloride infusion, , Intravenous, Continuous, Calvert Cantor, MD, Last Rate: 10 mL/hr at 02/22/13 1200, 10 mL/hr at 02/22/13 1200;  Chlorhexidine Gluconate Cloth 2 % PADS 6 each, 6 each, Topical, Q0600, Calvert Cantor, MD, 6 each at 02/24/13 0600  dexmedetomidine (PRECEDEX) 400 MCG/100ML infusion, 0.4-1.2 mcg/kg/hr, Intravenous, Titrated, Leslye Peer, MD, Last Rate: 24 mL/hr at 02/24/13 1140, 1 mcg/kg/hr at 02/24/13 1140;  famotidine (PEPCID) tablet 20 mg, 20 mg, Oral, BID, Calvert Cantor, MD, 20 mg at 02/24/13 1029;  hydrALAZINE (APRESOLINE) injection 5 mg, 5 mg, Intravenous, Q6H PRN, Ky Barban, MD;  magnesium sulfate IVPB 2 g 50 mL, 2 g, Intravenous, Once, Christen Bame, MD  morphine 2 MG/ML injection 1 mg, 1 mg, Intravenous, Q4H PRN, Erick Blinks, MD, 1 mg at 02/24/13 1029;  mupirocin ointment (BACTROBAN) 2 % 1 application, 1 application, Nasal, BID, Calvert Cantor, MD, 1 application at 02/24/13 1031;  nicotine (NICODERM CQ - dosed in mg/24 hours) patch 21 mg, 21 mg, Transdermal, Daily, Calvert Cantor, MD, 21 mg at 02/24/13 1030  potassium chloride SA (K-DUR,KLOR-CON) CR tablet 40 mEq, 40 mEq, Oral, TID, Ky Barban, MD, 40 mEq at 02/24/13 1884   Previous Impatient Admission/Date/Reason:   Emotional Health / Current Symptoms    Suicide/Self Harm  None reported   Suicide attempt in the past:   none reported or noted   Other harmful behavior:   none reported or noted   Psychotic/Dissociative Symptoms  None reported   Other Psychotic/Dissociative Symptoms:   none reported or noted    Attention/Behavioral Symptoms  Within Normal Limits   Other Attention / Behavioral Symptoms:   none reported or noted    Cognitive Impairment  Orientation - Place  Orientation - Self  Orientation - Situation  Orientation - Time  Within Normal Limits  Poor Judgement  Poor/Impaired Decision-Making   Other Cognitive Impairment:   none reported or noted    Mood and Adjustment  Mood Congruent    Stress, Anxiety, Trauma, Any Recent Loss/Stressor  None reported   Anxiety (frequency):   none reported or noted   Phobia (specify):   none reported or noted   Compulsive behavior (specify):   none reported or noted   Obsessive  behavior (specify):   none reported or noted   Other:   none reported or noted   Substance Abuse/Use  None   SBIRT completed (please refer for detailed history):  N  Self-reported substance use:   none reported or noted  UDS - none detected   Urinary Drug Screen Completed:  Y Alcohol level:   520 upon admision    Environmental/Housing/Living Arrangement  With Family Member  Stable housing   Who is in  the home:   dad   Emergency contact:  Tahir, Blank Sr. [phone]   Financial  Private Insurance   Patient's Strengths and Goals (patient's own words):   Pt has supportive dad at home.  Pt has friends at bedside. Pt has resources within the community.  Pt reached out for assistance when he felt his physical body out of control. Pt acknowledges the seriousness of his disease and what it is doing to his body.   Clinical Social Worker's Interpretive Summary:   CSW assessed pt at bedside.  Pt was alert and oriented x4. Pt denied SI/HI.  Pt denies AVHD.  Pt states he has a drinking problem and has been drinking since he was a teenager.  Pt reports to having a period of 8 years of being sober which ended in 2003.  Pt states that he drinks a half-gallon of vodka per day.  Pt states that his intake has increased to a gallon daily for the past week.  Pt stated he had drank 3 gallons prior to admission.  Pt has sought both inpatient and outpatient tx in the past.  Pt refused inpatient ETOH tx at this time.  Pt states he has a psychiatrist that he wants to go see and has a listing of AA meetings he can attend.  Pt refuses resources stating that he already has all of them.  Pt was appreciative of CSW assistance.  Pt plan is for outpatient tx and if that doesn't work pt stated he would attempt inpatient.  CSW again asked if pt would like inpatient or outpatient resources,  pt again refused.   Disposition:  Psych Clinical Social Worker signing off  Vickii Penna, [place] [phone]  Clinical Social Work

## 2013-02-25 ENCOUNTER — Inpatient Hospital Stay (HOSPITAL_COMMUNITY): Payer: PRIVATE HEALTH INSURANCE

## 2013-02-25 ENCOUNTER — Encounter (HOSPITAL_COMMUNITY): Payer: Self-pay | Admitting: Radiology

## 2013-02-25 LAB — CBC
HCT: 33.2 % — ABNORMAL LOW (ref 39.0–52.0)
Hemoglobin: 11.6 g/dL — ABNORMAL LOW (ref 13.0–17.0)
MCHC: 34.9 g/dL (ref 30.0–36.0)
MCV: 93.5 fL (ref 78.0–100.0)
RDW: 13 % (ref 11.5–15.5)
WBC: 9.2 10*3/uL (ref 4.0–10.5)

## 2013-02-25 LAB — URINALYSIS, ROUTINE W REFLEX MICROSCOPIC
Bilirubin Urine: NEGATIVE
Glucose, UA: NEGATIVE mg/dL
Hgb urine dipstick: NEGATIVE
Specific Gravity, Urine: 1.007 (ref 1.005–1.030)
Urobilinogen, UA: 0.2 mg/dL (ref 0.0–1.0)

## 2013-02-25 LAB — COMPREHENSIVE METABOLIC PANEL
ALT: 55 U/L — ABNORMAL HIGH (ref 0–53)
Albumin: 3.1 g/dL — ABNORMAL LOW (ref 3.5–5.2)
Alkaline Phosphatase: 91 U/L (ref 39–117)
Calcium: 9 mg/dL (ref 8.4–10.5)
GFR calc Af Amer: >90 mL/min (ref >90)
Potassium: 4.2 mEq/L (ref 3.5–5.1)
Sodium: 133 mEq/L — ABNORMAL LOW (ref 135–145)
Total Protein: 6.9 g/dL (ref 6.0–8.3)

## 2013-02-25 LAB — CK: Total CK: 96 U/L (ref 7–232)

## 2013-02-25 MED ORDER — IOHEXOL 300 MG/ML  SOLN
100.0000 mL | Freq: Once | INTRAMUSCULAR | Status: AC | PRN
  Administered 2013-02-25: 100 mL via INTRAVENOUS

## 2013-02-25 MED ORDER — OXYCODONE HCL 5 MG PO TABS
10.0000 mg | ORAL_TABLET | Freq: Four times a day (QID) | ORAL | Status: DC | PRN
  Administered 2013-02-25 – 2013-02-28 (×7): 10 mg via ORAL
  Filled 2013-02-25 (×7): qty 2

## 2013-02-25 MED ORDER — LORAZEPAM 1 MG PO TABS
1.0000 mg | ORAL_TABLET | ORAL | Status: DC | PRN
  Administered 2013-02-25 – 2013-02-26 (×4): 3 mg via ORAL
  Administered 2013-02-26 (×2): 1 mg via ORAL
  Administered 2013-02-27: 2 mg via ORAL
  Filled 2013-02-25 (×2): qty 3
  Filled 2013-02-25: qty 1
  Filled 2013-02-25: qty 3
  Filled 2013-02-25 (×3): qty 1
  Filled 2013-02-25: qty 3

## 2013-02-25 NOTE — Progress Notes (Addendum)
Notified Dr. Garald Braver of pt's hr along with diastolic bp  increasing without pt in any acute distress.  Gave prn hydralazine 5mg  IV per her suggestion at this time.

## 2013-02-25 NOTE — Progress Notes (Signed)
PULMONARY  / CRITICAL CARE MEDICINE  Name: Jeffrey Black MRN: [id number] DOB: 04-29-1961    ADMISSION DATE:  02/21/2013 CONSULTATION DATE:  02/24/12  REFERRING MD :  Dr. Sharon Seller PRIMARY SERVICE: [facility]  CHIEF COMPLAINT:  AMS, alcohol detox  BRIEF PATIENT DESCRIPTION:  52 year old man with PMH of anxiety, depression, alcoholism (drinks 1 gallon of alcohol per day) presenting with alcohol intoxication, EthOH level of 520 on admission. Pt requiring Prececex, PCCM consulted.    SIGNIFICANT EVENTS / STUDIES:  4/7 Admitted to [facility] for alcohol detox 4/8 Increasingly agitated despite increases in dosage of Ativan, CIWA of 16 4/9 Started on Precedex drip, CIWA of 1 4/9 Precedex at maximum dose, CIWA of 9 4/10 Weaning off from Precedex, CIWA of 5, [place] abd: liver echogenic with a coarsened echotexture, prominence of the mid pole left kidney  LINES / TUBES: 4/7 2 PIVs>>  CULTURES: PCR positive MRSA  ANTIBIOTICS: None  SUBJECTIVE:  Less agitated  denies SI/HI,  Refused inpatient EtOh detox offered by LCSW, wants outpt tx once discharged from [facility]   VITAL SIGNS: Temp:  [97.7 F (36.5 C)-98.2 F (36.8 C)] 97.7 F (36.5 C) (04/11 0416) Pulse Rate:  [73-95] 93 (04/11 0600) Resp:  [13-26] 20 (04/11 0600) BP: (90-152)/(46-103) 132/89 mmHg (04/11 0600) SpO2:  [88 %-100 %] 100 % (04/11 0600) HEMODYNAMICS:   VENTILATOR SETTINGS:  NA INTAKE / OUTPUT: Intake/Output     04/10 0701 - 04/11 0700 04/11 0701 - 04/12 0700   P.O. 2720    I.V. (mL/kg) 570.3 (6)    IV Piggyback 50    Total Intake(mL/kg) 3340.3 (34.9)    Urine (mL/kg/hr) 4475 (1.9)    Stool 1 (0)    Total Output 4476     Net -1135.7            PHYSICAL EXAMINATION: General:  Resting in bed in NAD Neuro:  RASS 0, CIWA 1, alert and oriented x3 HEENT:  PERLL, right eye with eyelid shut with crusty discharge (improved), right sclera with lateral erythema (improved) no active bleeding or discharge  Cardiovascular:   s1 and s2, no M/R/G Lungs:  CTAB, no increased WOB Abdomen:  Soft, non distended, +BS Extremities: no pitting edema Skin:  bruising on left forearm and shoulder, dry bloody scab at knee caps and big toes bilaterally   LABS:  Recent Labs Lab 02/21/13 1030 02/21/13 1249 02/22/13 0255 02/23/13 1701 02/24/13 0340 02/24/13 0640 02/25/13 0415  HGB 13.7  --  10.0* 11.3* 11.7*  --  11.6*  WBC 10.4  --  6.9 8.9 9.6  --  9.2  PLT 79*  --  52* 40* 44*  --  64*  NA 141  --  136 135 135  --  133*  K 3.1*  --  3.1* 3.1* 3.1*  --  4.2  CL 99  --  99 100 100  --  103  CO2 22  --  23 24 24   --  23  GLUCOSE 88  --  84 135* 132*  --  100*  BUN 12  --  7 6 8   --  10  CREATININE 0.63  --  0.61 0.56 0.53  --  0.69  CALCIUM 8.3*  --  8.0* 9.0 9.2  --  9.0  MG  --   --  1.2* 1.9  --  2.0  --   PHOS  --   --   --   --  3.4  --   --  AST 86*  --  54* 42* 43*  --  52*  ALT 81*  --  55* 47 47  --  55*  ALKPHOS 105  --  76 89 85  --  91  BILITOT 0.5  --  0.8 0.9 1.0  --  0.7  PROT 7.2  --  5.5* 6.7 6.7  --  6.9  ALBUMIN 3.8  --  2.9* 3.2* 3.1*  --  3.1*  INR  --   --  0.97  --   --   --   --   TROPONINI  --  <0.30  --   --   --   --   --     Recent Labs Lab 02/22/13 0120 02/22/13 1241  GLUCAP 85 121*    CXR: 4/8 Low lung volumes but no infiltrates  ASSESSMENT / PLAN:  PULMONARY A: No respiratory distress off Precedex morning of 4/11 P:   -Continue monitoring O2 status with PRN O2 supplementation for goal sats >92%  CARDIOVASCULAR A: Hypertension, mild - Likely secondary to alcohol withdrawal, no home antihypertensives. -HTN improved with Precedex for agitation and hydralazine.      Tachycardia- Sinus rhythm, no CP HR to 120s- likely 2/2 EtOH WD or pain (sore arms, legs)      Prolonged QTc - Resolved. QTc 540 on tele monitor and EKG on 4/10, likely secondary to [facility]; consider due to precedex.  P:  -Continue EtOH protocol - hydralazine IV PRN for SBP>140, DBP>90 -D/cd Zofran with  Qtc - Ativan PO PRN for agitation   RENAL A:  Decreased UOP- Improved.       Hyponatremia - Na 133, likely dilutional, pt has polydipsia - Hgb A1C 6.1%, pre diabetes      Hypokalemia - likely 2/2 alcohol WD, emesis, and decrease intake per mouth -Resolved after repletion       Hypomagnesemia - Resolved. Phosphorus wnl      [place] abd complete: Prominence of Left kidney      Risk for rhabdomyolysis -pt immobile for hours pta, now with soreness "all over"  P:   - CMP daily - Strict I&Os - Radiology recommends CT abd/pelvis to eval L renal lesion - Restrict free fluids  - UA and CK now  GASTROINTESTINAL A:  Emesis -resolved      Elevated LFTs- probably secondary to alcohol abuse.       [place] abd: liver echogenic with a coarsened echotexture, may be due to fatty infiltration but could be component of cirrhosis P:   -D/cd Zofran with prolonged QTc -Regular diet as sedation/agitation allows -CMP   HEMATOLOGIC A:  DVT prophylaxis      Anemia - normocytic, Hg trended up. NO signs of bleeding      Thrombocytopenia - due to EtOH -platelets trending up P:  -CBC trend -SCDs  INFECTIOUS A:  No active issues P:   -CBC in AM -Aspiration precautions while on Precedex  ENDOCRINE A:  No active issues      Hgb A1C 6.1%, pre-diabetes P:   - Chem in AM   NEUROLOGIC A:  Agitation/Confusion - from alcohol WD. CIWA 16 prior to Precedex. Pt's agitation fluctuates, currently calm- Precedex off AM of 4/11-No PT follow up needed, pt ambulating with walker P:   -Continue CIWA with Ativan PO PRN -Continue thiamine and folic acid x 3 days (last day 4/11) -Continue soft restraints if needed -Social Work consult for assistance with outpatient EtOH abuse tx>>pt has refused inpatient detox  TODAY'S SUMMARY: Monitor agitation off Precedex; transfer to telemetry if agitation improved.   I have personally obtained a history, examined the patient, evaluated laboratory and imaging results, formulated the  assessment and plan and placed orders.  Will transfer to tele bed and back to [facility]. PCCM will sign off as of 4/12  Levy Pupa, MD, PhD 02/25/2013, 7:17 AM [facility] [phone] or if no answer [phone]

## 2013-02-25 NOTE — Progress Notes (Signed)
PT's heartrate is sustaining in the 130s. Pt was taking a bath. Pt is now back in bed and hr is still in 130s. RN to page md on call and await further orders.

## 2013-02-25 NOTE — Evaluation (Signed)
Physical Therapy Evaluation Patient Details Name: Jeffrey Black MRN: [id number] DOB: 05-26-1961 Today's Date: 02/25/2013 Time: 4098-1191 PT Time Calculation (min): 28 min  PT Assessment / Plan / Recommendation Clinical Impression  Pt is a pleasent 52 y.o. male who presents with high level ETOH. Patient demonstrates deficits in functional mobility secondary to immobility, coordination deficits, and pain from fall. Anticipate patient will benefit from skilled PT to improve overall mobility and maximize independence. Encouraged patient to continue ambulation with nsg 3 x day. Will continue to see as indicated.    PT Assessment  Patient needs continued PT services    Follow Up Recommendations  No PT follow up    Does the patient have the potential to tolerate intense rehabilitation      Barriers to Discharge        Equipment Recommendations       Recommendations for Other Services     Frequency Min 3X/week    Precautions / Restrictions Precautions Precautions: Fall Restrictions Weight Bearing Restrictions: No   Pertinent Vitals/Pain 5/10 pain (elbow, back, knees)      Mobility  Bed Mobility Bed Mobility: Not assessed Details for Bed Mobility Assistance: pt up in chair upon entry Transfers Transfers: Sit to Stand;Stand to Sit Sit to Stand: 4: Min guard Stand to Sit: 4: Min guard Details for Transfer Assistance: very impulsive and unsteadiness secondary to tremors Ambulation/Gait Ambulation/Gait Assistance: 4: Min guard;4: Min assist Ambulation Distance (Feet): 400 Feet Assistive device: Rolling walker (100 ft without device) Ambulation/Gait Assistance Details: Max VCs for slow controlled movement. Patient very quick and impulsive, multiple balance checks when not using rw Gait Pattern: Step-through pattern;Ataxic;Narrow base of support Gait velocity: increased (unsafe) General Gait Details: overall instability secondary to withdrawl ataxia Stairs: No    Exercises  Total Joint Exercises Ankle Circles/Pumps: AROM;Both;10 reps   PT Diagnosis: Difficulty walking;Abnormality of gait;Acute pain  PT Problem List: Decreased strength;Decreased activity tolerance;Decreased balance;Decreased mobility;Decreased knowledge of use of DME;Pain PT Treatment Interventions: DME instruction;Gait training;Stair training;Functional mobility training;Therapeutic activities;Therapeutic exercise;Balance training   PT Goals Acute Rehab PT Goals PT Goal Formulation: With patient Time For Goal Achievement: 03/04/13 Potential to Achieve Goals: Good Pt will Ambulate: >150 feet;with modified independence PT Goal: Ambulate - Progress: Goal set today Pt will Go Up / Down Stairs: Flight;with modified independence PT Goal: Up/Down Stairs - Progress: Goal set today  Visit Information  Last PT Received On: 02/25/13 Assistance Needed: +1    Subjective Data  Subjective: I fell down the steps because I was drunk Patient Stated Goal: to go back home to fathers house   Prior Functioning  Home Living Lives With: Family;Other (Comment) (with Dad) Available Help at Discharge: Family Type of Home: House Home Access: Stairs to enter Secretary/administrator of Steps: 1 Entrance Stairs-Rails: None Home Layout: Two level;Bed/bath upstairs Alternate Level Stairs-Number of Steps: 12 Alternate Level Stairs-Rails: Can reach both Bathroom Shower/Tub: Engineer, manufacturing systems: Standard Home Adaptive Equipment: Environmental consultant - rolling (dad has an old walker) Prior Function Level of Independence: Independent Able to Take Stairs?: Yes Driving: Yes Dominant Hand: Right    Cognition  Cognition Overall Cognitive Status: Impaired Area of Impairment: Safety/judgement Arousal/Alertness: Awake/alert Orientation Level: Appears intact for tasks assessed;Oriented X4 / Intact Behavior During Session: [facility] for tasks performed Safety/Judgement: Decreased safety judgement for tasks  assessed;Impulsive Safety/Judgement - Other Comments: Patient very quick to move despite recognition of lines attached. Patient ambulating with rw and running into things because he is moving too fast  and not following VCs for slow and controlled mcvement    Extremity/Trunk Assessment Right Upper Extremity Assessment RUE ROM/Strength/Tone: WFL for tasks assessed RUE Sensation: WFL - Light Touch;WFL - Proprioception RUE Coordination: Deficits RUE Coordination Deficits: detox tremors  Left Upper Extremity Assessment LUE ROM/Strength/Tone: WFL for tasks assessed LUE Sensation: WFL - Light Touch;WFL - Proprioception LUE Coordination: Deficits LUE Coordination Deficits: detox tremors  Right Lower Extremity Assessment RLE ROM/Strength/Tone: WFL for tasks assessed RLE Sensation: WFL - Light Touch;WFL - Proprioception RLE Coordination: Deficits RLE Coordination Deficits: detox tremors  Left Lower Extremity Assessment LLE ROM/Strength/Tone: WFL for tasks assessed LLE Sensation: WFL - Light Touch;WFL - Proprioception LLE Coordination: Deficits LLE Coordination Deficits: detox tremors  Trunk Assessment Trunk Assessment: Normal   Balance Balance Balance Assessed: Yes Static Standing Balance Static Standing - Balance Support: No upper extremity supported Static Standing - Level of Assistance: 5: Stand by assistance;4: Min assist Static Standing - Comment/# of Minutes: 5 minutes  End of Session PT - End of Session Equipment Utilized During Treatment: Gait belt Activity Tolerance: Patient tolerated treatment well;Treatment limited secondary to medical complications (Comment) (increased HR 160s with ambulation) Patient left: in chair;with call bell/phone within reach Nurse Communication: Mobility status  GP     Fabio Asa 02/25/2013, 10:43 AM Charlotte Crumb, PT DPT  [phone]

## 2013-02-25 NOTE — Progress Notes (Signed)
02/25/13 Patient was transferred from 2100 to Rm 5529 with Dx of Alcohol Withdrawal. IV in RT Hand NSL,Regular diet,ambulates with front wheel walker,Alert & Oriented, on contact for MRSA nare swab was positive, SCD's for DVT,bruising noted bilateral arms and skin is dry and flaky,last BM 02/24/13, moderate fall risk, prns- Ativan 3 mg and Oxycodone given at 1315pm, and Nicotine patch on rt shoulder.

## 2013-02-25 NOTE — Progress Notes (Signed)
Report given to Robin, RN on [facility].  Pt's BP running soft after getting hydralazine earlier, but pt is asymptomatic of hypotension.  No s/s of DT at this time.  Pain tolerable.

## 2013-02-26 LAB — COMPREHENSIVE METABOLIC PANEL
ALT: 62 U/L — ABNORMAL HIGH (ref 0–53)
AST: 45 U/L — ABNORMAL HIGH (ref 0–37)
Alkaline Phosphatase: 98 U/L (ref 39–117)
CO2: 23 mEq/L (ref 19–32)
Calcium: 9.6 mg/dL (ref 8.4–10.5)
Chloride: 100 mEq/L (ref 96–112)
GFR calc Af Amer: >90 mL/min (ref >90)
GFR calc non Af Amer: >90 mL/min (ref >90)
Glucose, Bld: 100 mg/dL — ABNORMAL HIGH (ref 70–99)
Sodium: 134 mEq/L — ABNORMAL LOW (ref 135–145)
Total Bilirubin: 0.6 mg/dL (ref 0.3–1.2)

## 2013-02-26 LAB — CBC
MCH: 32.7 pg (ref 26.0–34.0)
MCHC: 34.3 g/dL (ref 30.0–36.0)
Platelets: 118 10*3/uL — ABNORMAL LOW (ref 150–400)
RDW: 13.4 % (ref 11.5–15.5)

## 2013-02-26 MED ORDER — PANTOPRAZOLE SODIUM 40 MG PO TBEC
40.0000 mg | DELAYED_RELEASE_TABLET | Freq: Every day | ORAL | Status: DC
  Administered 2013-02-26 – 2013-02-28 (×3): 40 mg via ORAL
  Filled 2013-02-26 (×3): qty 1

## 2013-02-26 MED ORDER — METOPROLOL TARTRATE 12.5 MG HALF TABLET
12.5000 mg | ORAL_TABLET | Freq: Two times a day (BID) | ORAL | Status: DC
  Administered 2013-02-26 – 2013-02-28 (×4): 12.5 mg via ORAL
  Filled 2013-02-26 (×5): qty 1

## 2013-02-26 MED ORDER — ENOXAPARIN SODIUM 40 MG/0.4ML ~~LOC~~ SOLN
40.0000 mg | SUBCUTANEOUS | Status: DC
  Administered 2013-02-26 – 2013-02-27 (×2): 40 mg via SUBCUTANEOUS
  Filled 2013-02-26 (×3): qty 0.4

## 2013-02-26 NOTE — Progress Notes (Signed)
Physical Therapy Treatment Patient Details Name: Jeffrey Black MRN: [id number] DOB: 1961-05-05 Today's Date: 02/26/2013 Time: 2130-8657 PT Time Calculation (min): 26 min  PT Assessment / Plan / Recommendation Comments on Treatment Session  Pt is improving in mobility and safety.  He still has tachycardia with activity and evidence of fine tremors. Recommend pt continue to ambulate with nursing supervision several times a day    Follow Up Recommendations  No PT follow up     Does the patient have the potential to tolerate intense rehabilitation     Barriers to Discharge        Equipment Recommendations       Recommendations for Other Services    Frequency     Plan Discharge plan remains appropriate;Frequency remains appropriate    Precautions / Restrictions Precautions Precautions: Fall Restrictions Weight Bearing Restrictions: No   Pertinent Vitals/Pain C/o minor pain in the back of left leg that "worked itself out" as he walked    Mobility  Bed Mobility Bed Mobility: Not assessed Details for Bed Mobility Assistance: pt up in chair upon entry Transfers Transfers: Sit to Stand;Stand to Sit Sit to Stand: 6: Modified independent (Device/Increase time) Stand to Sit: 6: Modified independent (Device/Increase time) Details for Transfer Assistance: fine tremors still evident Ambulation/Gait Ambulation/Gait Assistance: 5: Supervision Ambulation Distance (Feet): 600 Feet (300 x2) Assistive device: Rolling walker;None (100 ft without device) Ambulation/Gait Assistance Details: pt encouraged to slow down and maintain balance. only one major loss of balance today Gait Pattern: Within Functional Limits;Step-through pattern Gait velocity: occasional cues to maintain safe speed General Gait Details: increased HR to 130's and 140's with gait. Pt encouraged to slow down to maintain safety and lower HR Stairs: No    Exercises General Exercises - Lower Extremity Hip  ABduction/ADduction: AROM;Both;10 reps;Standing Hip Flexion/Marching: AROM;Both;10 reps;Standing Other Exercises Other Exercises: repeated sit to stand with trunk extension x 5 reps   PT Diagnosis:    PT Problem List:   PT Treatment Interventions:     PT Goals Acute Rehab PT Goals PT Goal Formulation: With patient Time For Goal Achievement: 03/04/13 Potential to Achieve Goals: Good Pt will Ambulate: >150 feet;with modified independence PT Goal: Ambulate - Progress: Progressing toward goal Pt will Go Up / Down Stairs: Flight;with modified independence  Visit Information  Last PT Received On: 02/26/13 Assistance Needed: +1    Subjective Data  Subjective: "I'm a thousand times better than I was 2 days ago' Patient Stated Goal: to get off fall restrictions   Cognition  Cognition Overall Cognitive Status: Appears within functional limits for tasks assessed/performed Area of Impairment: Safety/judgement Arousal/Alertness: Awake/alert Orientation Level: Appears intact for tasks assessed;Oriented X4 / Intact Behavior During Session: [facility] for tasks performed Safety/Judgement - Other Comments: Patient very quick to move despite recognition of lines attached. Patient ambulating with rw and running into things because he is moving too fast and not following VCs for slow and controlled mcvement Cognition - Other Comments: pt shows better ability to slow down,but still demonstrates some impulsivity     Balance  Balance Balance Assessed: Yes Static Standing Balance Static Standing - Balance Support: No upper extremity supported Static Standing - Level of Assistance: 7: Independent  End of Session PT - End of Session Activity Tolerance: Patient tolerated treatment well Patient left: in chair;with call bell/phone within reach Nurse Communication: Mobility status   GP  Rosey Bath K. Manson Passey, [place] [phone] 02/26/2013, 12:39 PM
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
## 2013-02-26 NOTE — Consult Note (Signed)
Reason for Consult: alcohol abuse Referring Physician: unknown  Jeffrey Black is an 52 y.o. male.  HPI:    Jeffrey Black is a 52 y.o. male with history of alcoholism, who is brought to the ED by his sister for alcohol intoxication. He was evaluated in the ED and was found to have an ETOH level of 520. Patient reports the was in his usual state of health when he began developing tremors and vomiting last night. His symptoms have progressively worsened since then. He also admits to drinking heavily, drinking approximately 3 gallons of vodka over the weekend. Per more recently he is drinking 1.5 gallons per week of alcohol now. Wants detox now but for rehab he wants some time to think and plan.   Records indicate that he lost his job a few months ago and has become increasingly depressed. He denies any suicidal ideations. Reports his depression is situational now. Not interested for any meds No si attempts in the past. Had detox and rehab in the past.  Past Medical History  Diagnosis Date  . ETOH abuse   . Psoriasis     History reviewed. No pertinent past surgical history.  Family History  Problem Relation Age of Onset  . Stroke Father     Social History:  reports that he has been smoking Cigarettes.  He has a 20 pack-year smoking history. He has never used smokeless tobacco. He reports that  drinks alcohol. He reports that he does not use illicit drugs.  Allergies: No Known Allergies  Medications: I have reviewed the patient's current medications.  Results for orders placed during the hospital encounter of 02/21/13 (from the past 48 hour(s))  COMPREHENSIVE METABOLIC PANEL     Status: Abnormal   Collection Time    02/25/13  4:15 AM      Result Value Range   Sodium 133 (*) 135 - 145 mEq/L   Potassium 4.2  3.5 - 5.1 mEq/L   Comment: DELTA CHECK NOTED     HEMOLYSIS AT THIS LEVEL MAY AFFECT RESULT   Chloride 103  96 - 112 mEq/L   CO2 23  19 - 32 mEq/L   Glucose, Bld 100 (*)  70 - 99 mg/dL   BUN 10  6 - 23 mg/dL   Creatinine, Ser 1.61  0.50 - 1.35 mg/dL   Calcium 9.0  8.4 - 09.6 mg/dL   Total Protein 6.9  6.0 - 8.3 g/dL   Albumin 3.1 (*) 3.5 - 5.2 g/dL   AST 52 (*) 0 - 37 U/L   ALT 55 (*) 0 - 53 U/L   Alkaline Phosphatase 91  39 - 117 U/L   Total Bilirubin 0.7  0.3 - 1.2 mg/dL   GFR calc non Af Amer >90  >90 mL/min   GFR calc Af Amer >90  >90 mL/min   Comment:            The eGFR has been calculated     using the CKD EPI equation.     This calculation has not been     validated in all clinical     situations.     eGFR's persistently     <90 mL/min signify     possible Chronic Kidney Disease.  CBC     Status: Abnormal   Collection Time    02/25/13  4:15 AM      Result Value Range   WBC 9.2  4.0 - 10.5 K/uL   RBC 3.55 (*)  4.22 - 5.81 MIL/uL   Hemoglobin 11.6 (*) 13.0 - 17.0 g/dL   HCT 29.5 (*) 62.1 - 30.8 %   MCV 93.5  78.0 - 100.0 fL   MCH 32.7  26.0 - 34.0 pg   MCHC 34.9  30.0 - 36.0 g/dL   RDW 65.7  84.6 - 96.2 %   Platelets 64 (*) 150 - 400 K/uL   Comment: DELTA CHECK NOTED     CONSISTENT WITH PREVIOUS RESULT  URINALYSIS, ROUTINE W REFLEX MICROSCOPIC     Status: None   Collection Time    02/25/13 11:58 AM      Result Value Range   Color, Urine YELLOW  YELLOW   APPearance CLEAR  CLEAR   Specific Gravity, Urine 1.007  1.005 - 1.030   pH 7.5  5.0 - 8.0   Glucose, UA NEGATIVE  NEGATIVE mg/dL   Hgb urine dipstick NEGATIVE  NEGATIVE   Bilirubin Urine NEGATIVE  NEGATIVE   Ketones, ur NEGATIVE  NEGATIVE mg/dL   Protein, ur NEGATIVE  NEGATIVE mg/dL   Urobilinogen, UA 0.2  0.0 - 1.0 mg/dL   Nitrite NEGATIVE  NEGATIVE   Leukocytes, UA NEGATIVE  NEGATIVE   Comment: MICROSCOPIC NOT DONE ON URINES WITH NEGATIVE PROTEIN, BLOOD, LEUKOCYTES, NITRITE, OR GLUCOSE <1000 mg/dL.  CK     Status: None   Collection Time    02/25/13 12:03 PM      Result Value Range   Total CK 96  7 - 232 U/L  COMPREHENSIVE METABOLIC PANEL     Status: Abnormal    Collection Time    02/26/13  7:00 AM      Result Value Range   Sodium 134 (*) 135 - 145 mEq/L   Potassium 4.2  3.5 - 5.1 mEq/L   Chloride 100  96 - 112 mEq/L   CO2 23  19 - 32 mEq/L   Glucose, Bld 100 (*) 70 - 99 mg/dL   BUN 9  6 - 23 mg/dL   Creatinine, Ser 9.52  0.50 - 1.35 mg/dL   Calcium 9.6  8.4 - 84.1 mg/dL   Total Protein 7.5  6.0 - 8.3 g/dL   Albumin 3.7  3.5 - 5.2 g/dL   AST 45 (*) 0 - 37 U/L   ALT 62 (*) 0 - 53 U/L   Alkaline Phosphatase 98  39 - 117 U/L   Total Bilirubin 0.6  0.3 - 1.2 mg/dL   GFR calc non Af Amer >90  >90 mL/min   GFR calc Af Amer >90  >90 mL/min   Comment:            The eGFR has been calculated     using the CKD EPI equation.     This calculation has not been     validated in all clinical     situations.     eGFR's persistently     <90 mL/min signify     possible Chronic Kidney Disease.  CBC     Status: Abnormal   Collection Time    02/26/13  7:00 AM      Result Value Range   WBC 8.6  4.0 - 10.5 K/uL   RBC 3.46 (*) 4.22 - 5.81 MIL/uL   Hemoglobin 11.3 (*) 13.0 - 17.0 g/dL   HCT 32.4 (*) 40.1 - 02.7 %   MCV 95.1  78.0 - 100.0 fL   MCH 32.7  26.0 - 34.0 pg   MCHC 34.3  30.0 - 36.0  g/dL   RDW 09.8  11.9 - 14.7 %   Platelets 118 (*) 150 - 400 K/uL   Comment: DELTA CHECK NOTED     CONSISTENT WITH PREVIOUS RESULT    Ct Abd Wo & W Cm  02/25/2013  *RADIOLOGY REPORT*  Clinical Data: Follow up possible left renal mass on recent ultrasound.  CT ABDOMEN WITHOUT AND WITH CONTRAST  Technique:  Multidetector CT imaging of the abdomen was performed following the standard protocol before and during bolus administration of intravenous contrast.  Contrast: OMNIPAQUE IOHEXOL 300 MG/ML IV.  Comparison: Abdominal ultrasound yesterday.  Findings: No evidence of a mass arising from the mid left kidney. A "dromedary hump" is present, accounting for the sonographic findings.  No focal parenchymal abnormality involving either kidney.  No opaque upper urinary  tract calculi on the unenhanced images.  Severe diffuse hepatic steatosis without focal hepatic parenchymal abnormality.  Normal-appearing spleen, pancreas, adrenal glands, and gallbladder.  No biliary ductal dilation.  Mild aorto-iliac atherosclerosis.  Patent visceral arteries.  No significant lymphadenopathy.  Stomach normal in appearance, filled with fluid and food. Thickening of the wall of the distal esophagus.  Fluid in the distal esophagus. Visualized small bowel and colon unremarkable. No ascites.  Bone window images demonstrate old healed fractures involving the right posterior 11th and 12th ribs and mild lower thoracic spondylosis. Schmorl's nodes involving multiple thoracic and lumbar vertebrae.  Visualized lung bases clear.  Heart size normal.  IMPRESSION:  1.  No evidence of left renal mass.  "Dromedary hump" involving the mid kidney accounts for the sonographic findings. 2.  Severe diffuse hepatic steatosis without focal hepatic parenchymal abnormality. 3.  Circumferential wall thickening involving the distal esophagus with fluid in the distal esophagus, query GE reflux disease.  No evidence of hiatal hernia. 4.  Mild aorto-iliac atherosclerosis, somewhat advanced for age.   Original Report Authenticated By: Hulan Saas, M.D.     ROS Blood pressure 125/80, pulse 115, temperature 99.3 F (37.4 C), temperature source Oral, resp. rate 20, height 5\' 5"  (1.651 m), weight 95.8 kg (211 lb 3.2 oz), SpO2 97.00%. Physical Exam    Wonda Cerise Mental Status Examination/Evaluation:  Appearance: on bed   Eye Contact:: Good   Speech: normal   Volume: Normal   Mood: depressed   Affect: ristricted  Thought Process: organized   Orientation: Full   Thought Content: NO AVH  Suicidal Thoughts: No   Homicidal Thoughts: no  Memory: Recent; Poor   Judgement: Impaired   Insight: Lacking   Psychomotor Activity: Normal   Concentration: Fair   Recall: Fair   Akathisia: No     Assessment:  AXIS  I: alcohol dep,  AXIS II: Deferred  AXIS III: see emdical hx ?  ? ?  ? ? ?  ? ? ?  ? ? ?  ? ? ?  AXIS IV: problems related to social environment, problems with access to health care services and problems with primary support group  AXIS V: 45 ?  Treatment Plan/Recommendations:   1.will recommend drug rehab after detox. Psy SW can help with information as pt just wants info at this time as plans to wait before he goes for rehab  2. Not intersted for any meds at this time  3. Will follow as nedded  02/26/2013, 6:52 PM

## 2013-02-26 NOTE — Plan of Care (Signed)
Problem: Phase II Progression Outcomes Goal: Obtain order to discontinue catheter if appropriate Outcome: Completed/Met Date Met:  02/26/13 Condom cath
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
## 2013-02-27 LAB — BASIC METABOLIC PANEL
Calcium: 9.4 mg/dL (ref 8.4–10.5)
GFR calc Af Amer: >90 mL/min (ref >90)
GFR calc non Af Amer: >90 mL/min (ref >90)
Glucose, Bld: 93 mg/dL (ref 70–99)
Potassium: 3.9 mEq/L (ref 3.5–5.1)
Sodium: 137 mEq/L (ref 135–145)

## 2013-02-27 LAB — PHOSPHORUS: Phosphorus: 4.8 mg/dL — ABNORMAL HIGH (ref 2.3–4.6)
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
## 2013-02-27 NOTE — Progress Notes (Signed)
  Echocardiogram 2D Echocardiogram has been performed.  Jeffrey Black 02/27/2013, 10:42 AM
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
## 2013-02-27 NOTE — Consult Note (Signed)
Reason for Consult: alcohol abuse Referring Physician: unknown  Jeffrey Black is an 52 y.o. male.  HPI:    Jeffrey Black is a 52 y.o. male with history of alcoholism, who is brought to the ED by his sister for alcohol intoxication. He was evaluated in the ED and was found to have an ETOH level of 520. Patient reports the was in his usual state of health when he began developing tremors and vomiting last night. His symptoms have progressively worsened since then. He also admits to drinking heavily, drinking approximately 3 gallons of vodka over the weekend. Per more recently he is drinking 1.5 gallons per week of alcohol now. Wants detox now but for rehab he wants some time to think and plan.    Interval Hx:  Reports better mood now.  He denies any suicidal ideations. Not concerned about his depression now. Not interested for any meds No si attempts in the past. Had detox and rehab in the past. Per pt he want to go home 1st before rehab.  Past Medical History  Diagnosis Date  . ETOH abuse   . Psoriasis     History reviewed. No pertinent past surgical history.  Family History  Problem Relation Age of Onset  . Stroke Father     Social History:  reports that he has been smoking Cigarettes.  He has a 20 pack-year smoking history. He has never used smokeless tobacco. He reports that  drinks alcohol. He reports that he does not use illicit drugs.  Allergies: No Known Allergies  Medications: I have reviewed the patient's current medications.  Results for orders placed during the hospital encounter of 02/21/13 (from the past 48 hour(s))  COMPREHENSIVE METABOLIC PANEL     Status: Abnormal   Collection Time    02/26/13  7:00 AM      Result Value Range   Sodium 134 (*) 135 - 145 mEq/L   Potassium 4.2  3.5 - 5.1 mEq/L   Chloride 100  96 - 112 mEq/L   CO2 23  19 - 32 mEq/L   Glucose, Bld 100 (*) 70 - 99 mg/dL   BUN 9  6 - 23 mg/dL   Creatinine, Ser 4.09  0.50 - 1.35 mg/dL   Calcium 9.6  8.4 - 81.1 mg/dL   Total Protein 7.5  6.0 - 8.3 g/dL   Albumin 3.7  3.5 - 5.2 g/dL   AST 45 (*) 0 - 37 U/L   ALT 62 (*) 0 - 53 U/L   Alkaline Phosphatase 98  39 - 117 U/L   Total Bilirubin 0.6  0.3 - 1.2 mg/dL   GFR calc non Af Amer >90  >90 mL/min   GFR calc Af Amer >90  >90 mL/min   Comment:            The eGFR has been calculated     using the CKD EPI equation.     This calculation has not been     validated in all clinical     situations.     eGFR's persistently     <90 mL/min signify     possible Chronic Kidney Disease.  CBC     Status: Abnormal   Collection Time    02/26/13  7:00 AM      Result Value Range   WBC 8.6  4.0 - 10.5 K/uL   RBC 3.46 (*) 4.22 - 5.81 MIL/uL   Hemoglobin 11.3 (*) 13.0 - 17.0 g/dL  HCT 32.9 (*) 39.0 - 52.0 %   MCV 95.1  78.0 - 100.0 fL   MCH 32.7  26.0 - 34.0 pg   MCHC 34.3  30.0 - 36.0 g/dL   RDW 16.1  09.6 - 04.5 %   Platelets 118 (*) 150 - 400 K/uL   Comment: DELTA CHECK NOTED     CONSISTENT WITH PREVIOUS RESULT  BASIC METABOLIC PANEL     Status: None   Collection Time    02/27/13  4:58 AM      Result Value Range   Sodium 137  135 - 145 mEq/L   Potassium 3.9  3.5 - 5.1 mEq/L   Chloride 103  96 - 112 mEq/L   CO2 22  19 - 32 mEq/L   Glucose, Bld 93  70 - 99 mg/dL   BUN 9  6 - 23 mg/dL   Creatinine, Ser 4.09  0.50 - 1.35 mg/dL   Calcium 9.4  8.4 - 81.1 mg/dL   GFR calc non Af Amer >90  >90 mL/min   GFR calc Af Amer >90  >90 mL/min   Comment:            The eGFR has been calculated     using the CKD EPI equation.     This calculation has not been     validated in all clinical     situations.     eGFR's persistently     <90 mL/min signify     possible Chronic Kidney Disease.  MAGNESIUM     Status: None   Collection Time    02/27/13  4:58 AM      Result Value Range   Magnesium 2.2  1.5 - 2.5 mg/dL  PHOSPHORUS     Status: Abnormal   Collection Time    02/27/13  4:58 AM      Result Value Range   Phosphorus 4.8 (*)  2.3 - 4.6 mg/dL    No results found.  ROS  Blood pressure 144/91, pulse 94, temperature 98.4 F (36.9 C), temperature source Oral, resp. rate 20, height 5\' 5"  (1.651 m), weight 95.8 kg (211 lb 3.2 oz), SpO2 98.00%. Physical Exam Reason for Consult: alcohol abuse Referring Physician: unknown  Jeffrey Black is an 52 y.o. male.  HPI:    Jeffrey Black is a 52 y.o. male with history of alcoholism, who is brought to the ED by his sister for alcohol intoxication. He was evaluated in the ED and was found to have an ETOH level of 520. Patient reports the was in his usual state of health when he began developing tremors and vomiting last night. His symptoms have progressively worsened since then. He also admits to drinking heavily, drinking approximately 3 gallons of vodka over the weekend. Per more recently he is drinking 1.5 gallons per week of alcohol now. Wants detox now but for rehab he wants some time to think and plan.   Records indicate that he lost his job a few months ago and has become increasingly depressed. He denies any suicidal ideations. Reports his depression is situational now. Not interested for any meds No si attempts in the past. Had detox and rehab in the past.  Past Medical History  Diagnosis Date  . ETOH abuse   . Psoriasis     History reviewed. No pertinent past surgical history.  Family History  Problem Relation Age of Onset  . Stroke Father     Social History:  reports that  he has been smoking Cigarettes.  He has a 20 pack-year smoking history. He has never used smokeless tobacco. He reports that  drinks alcohol. He reports that he does not use illicit drugs.  Allergies: No Known Allergies  Medications: I have reviewed the patient's current medications.  Results for orders placed during the hospital encounter of 02/21/13 (from the past 48 hour(s))  COMPREHENSIVE METABOLIC PANEL     Status: Abnormal   Collection Time    02/25/13  4:15 AM      Result  Value Range   Sodium 133 (*) 135 - 145 mEq/L   Potassium 4.2  3.5 - 5.1 mEq/L   Comment: DELTA CHECK NOTED     HEMOLYSIS AT THIS LEVEL MAY AFFECT RESULT   Chloride 103  96 - 112 mEq/L   CO2 23  19 - 32 mEq/L   Glucose, Bld 100 (*) 70 - 99 mg/dL   BUN 10  6 - 23 mg/dL   Creatinine, Ser 6.29  0.50 - 1.35 mg/dL   Calcium 9.0  8.4 - 52.8 mg/dL   Total Protein 6.9  6.0 - 8.3 g/dL   Albumin 3.1 (*) 3.5 - 5.2 g/dL   AST 52 (*) 0 - 37 U/L   ALT 55 (*) 0 - 53 U/L   Alkaline Phosphatase 91  39 - 117 U/L   Total Bilirubin 0.7  0.3 - 1.2 mg/dL   GFR calc non Af Amer >90  >90 mL/min   GFR calc Af Amer >90  >90 mL/min   Comment:            The eGFR has been calculated     using the CKD EPI equation.     This calculation has not been     validated in all clinical     situations.     eGFR's persistently     <90 mL/min signify     possible Chronic Kidney Disease.  CBC     Status: Abnormal   Collection Time    02/25/13  4:15 AM      Result Value Range   WBC 9.2  4.0 - 10.5 K/uL   RBC 3.55 (*) 4.22 - 5.81 MIL/uL   Hemoglobin 11.6 (*) 13.0 - 17.0 g/dL   HCT 41.3 (*) 24.4 - 01.0 %   MCV 93.5  78.0 - 100.0 fL   MCH 32.7  26.0 - 34.0 pg   MCHC 34.9  30.0 - 36.0 g/dL   RDW 27.2  53.6 - 64.4 %   Platelets 64 (*) 150 - 400 K/uL   Comment: DELTA CHECK NOTED     CONSISTENT WITH PREVIOUS RESULT  URINALYSIS, ROUTINE W REFLEX MICROSCOPIC     Status: None   Collection Time    02/25/13 11:58 AM      Result Value Range   Color, Urine YELLOW  YELLOW   APPearance CLEAR  CLEAR   Specific Gravity, Urine 1.007  1.005 - 1.030   pH 7.5  5.0 - 8.0   Glucose, UA NEGATIVE  NEGATIVE mg/dL   Hgb urine dipstick NEGATIVE  NEGATIVE   Bilirubin Urine NEGATIVE  NEGATIVE   Ketones, ur NEGATIVE  NEGATIVE mg/dL   Protein, ur NEGATIVE  NEGATIVE mg/dL   Urobilinogen, UA 0.2  0.0 - 1.0 mg/dL   Nitrite NEGATIVE  NEGATIVE   Leukocytes, UA NEGATIVE  NEGATIVE   Comment: MICROSCOPIC NOT DONE ON URINES WITH NEGATIVE  PROTEIN, BLOOD, LEUKOCYTES, NITRITE, OR GLUCOSE <1000 mg/dL.  CK  Status: None   Collection Time    02/25/13 12:03 PM      Result Value Range   Total CK 96  7 - 232 U/L  COMPREHENSIVE METABOLIC PANEL     Status: Abnormal   Collection Time    02/26/13  7:00 AM      Result Value Range   Sodium 134 (*) 135 - 145 mEq/L   Potassium 4.2  3.5 - 5.1 mEq/L   Chloride 100  96 - 112 mEq/L   CO2 23  19 - 32 mEq/L   Glucose, Bld 100 (*) 70 - 99 mg/dL   BUN 9  6 - 23 mg/dL   Creatinine, Ser 1.61  0.50 - 1.35 mg/dL   Calcium 9.6  8.4 - 09.6 mg/dL   Total Protein 7.5  6.0 - 8.3 g/dL   Albumin 3.7  3.5 - 5.2 g/dL   AST 45 (*) 0 - 37 U/L   ALT 62 (*) 0 - 53 U/L   Alkaline Phosphatase 98  39 - 117 U/L   Total Bilirubin 0.6  0.3 - 1.2 mg/dL   GFR calc non Af Amer >90  >90 mL/min   GFR calc Af Amer >90  >90 mL/min   Comment:            The eGFR has been calculated     using the CKD EPI equation.     This calculation has not been     validated in all clinical     situations.     eGFR's persistently     <90 mL/min signify     possible Chronic Kidney Disease.  CBC     Status: Abnormal   Collection Time    02/26/13  7:00 AM      Result Value Range   WBC 8.6  4.0 - 10.5 K/uL   RBC 3.46 (*) 4.22 - 5.81 MIL/uL   Hemoglobin 11.3 (*) 13.0 - 17.0 g/dL   HCT 04.5 (*) 40.9 - 81.1 %   MCV 95.1  78.0 - 100.0 fL   MCH 32.7  26.0 - 34.0 pg   MCHC 34.3  30.0 - 36.0 g/dL   RDW 91.4  78.2 - 95.6 %   Platelets 118 (*) 150 - 400 K/uL   Comment: DELTA CHECK NOTED     CONSISTENT WITH PREVIOUS RESULT    Ct Abd Wo & W Cm  02/25/2013  *RADIOLOGY REPORT*  Clinical Data: Follow up possible left renal mass on recent ultrasound.  CT ABDOMEN WITHOUT AND WITH CONTRAST  Technique:  Multidetector CT imaging of the abdomen was performed following the standard protocol before and during bolus administration of intravenous contrast.  Contrast: OMNIPAQUE IOHEXOL 300 MG/ML IV.  Comparison: Abdominal ultrasound  yesterday.  Findings: No evidence of a mass arising from the mid left kidney. A "dromedary hump" is present, accounting for the sonographic findings.  No focal parenchymal abnormality involving either kidney.  No opaque upper urinary tract calculi on the unenhanced images.  Severe diffuse hepatic steatosis without focal hepatic parenchymal abnormality.  Normal-appearing spleen, pancreas, adrenal glands, and gallbladder.  No biliary ductal dilation.  Mild aorto-iliac atherosclerosis.  Patent visceral arteries.  No significant lymphadenopathy.  Stomach normal in appearance, filled with fluid and food. Thickening of the wall of the distal esophagus.  Fluid in the distal esophagus. Visualized small bowel and colon unremarkable. No ascites.  Bone window images demonstrate old healed fractures involving the right posterior 11th and 12th ribs and  mild lower thoracic spondylosis. Schmorl's nodes involving multiple thoracic and lumbar vertebrae.  Visualized lung bases clear.  Heart size normal.  IMPRESSION:  1.  No evidence of left renal mass.  "Dromedary hump" involving the mid kidney accounts for the sonographic findings. 2.  Severe diffuse hepatic steatosis without focal hepatic parenchymal abnormality. 3.  Circumferential wall thickening involving the distal esophagus with fluid in the distal esophagus, query GE reflux disease.  No evidence of hiatal hernia. 4.  Mild aorto-iliac atherosclerosis, somewhat advanced for age.   Original Report Authenticated By: Hulan Saas, M.D.     ROS Blood pressure 125/80, pulse 115, temperature 99.3 F (37.4 C), temperature source Oral, resp. rate 20, height 5\' 5"  (1.651 m), weight 95.8 kg (211 lb 3.2 oz), SpO2 97.00%. Physical Exam    Wonda Cerise Mental Status Examination/Evaluation:  Appearance: on bed   Eye Contact:: Good   Speech: normal   Volume: Normal   Mood: depressed   Affect: ristricted  Thought Process: organized   Orientation: Full   Thought Content: NO  AVH  Suicidal Thoughts: No   Homicidal Thoughts: no  Memory: Recent; Poor   Judgement: Impaired   Insight: Lacking   Psychomotor Activity: Normal   Concentration: Fair   Recall: Fair   Akathisia: No     Assessment:  AXIS I: alcohol dep,  AXIS II: Deferred  AXIS III: see emdical hx ?  ? ?  ? ? ?  ? ? ?  ? ? ?  ? ? ?  AXIS IV: problems related to social environment, problems with access to health care services and problems with primary support group  AXIS V: 45 ?  Treatment Plan/Recommendations:   1.will recommend drug rehab after detox. Psy SW can help with information as pt just wants info at this time as he plans to wait before he goes for rehab. Per primary team there is a concern about elderly because to his father at home. I will recommend to involve SW and report it if needed  2. Not intersted for any meds at this time  3. Will follow as nedded    Wonda Cerise    02/27/2013, 9:31 PM

## 2013-02-28 MED ORDER — LORAZEPAM 0.5 MG PO TABS: 1.0000 mg | ORAL_TABLET | ORAL | Status: DC | PRN

## 2013-02-28 MED ORDER — NICOTINE 21 MG/24HR TD PT24: 1.0000 | MEDICATED_PATCH | Freq: Every day | TRANSDERMAL | Status: DC

## 2013-02-28 MED ORDER — METOPROLOL TARTRATE 12.5 MG HALF TABLET: 12.5000 mg | ORAL_TABLET | Freq: Two times a day (BID) | ORAL | Status: DC
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
## 2013-02-28 NOTE — Progress Notes (Addendum)
NCM spoke to pt and states he lives at home with his father, Louden Houseworth Sr. His dad is allowing him to stay with him until he able to find employment. 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 Pt gave permission to speak to his father about living arrangements. CSW will follow up with his father. Pt states he goes to [employer] for [employer] His niece has a neighbor that is willing to be his sponsor. States the loss of his job triggered his relapse. Reports he had been drinking since he was 52 years old and had a eight years of sobriety a few years ago. His alcohol abuse is a issue with his family and especially his father. He states he will meet with his family to discuss issues and try to seek additional help if needed to resolve. Explained to pt he needs to follow up with his COBRA plan to assist him with finding a PCP. He is established with [facility] and will make an appt if he feels he cannot deal with his emotional issues. Has taking anti-depressants in the past. No further NCM needs identified.  Isidoro Donning RN CCM Case Mgmt phone [phone]
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
## 2013-02-28 NOTE — Progress Notes (Signed)
Pt. discharged to floor,verbalized understanding of discharged instruction,medication,restriction,diet and follow up appointment.Baseline Vitals sign stable,Pt comfortable,no sign and symptom of distress. 
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
## 2013-04-12 ENCOUNTER — Inpatient Hospital Stay (HOSPITAL_COMMUNITY)
Admission: EM | Admit: 2013-04-12 | Discharge: 2013-04-14 | DRG: 897 | Disposition: A | Discharge status: Home / Self Care | Payer: PRIVATE HEALTH INSURANCE | Attending: Internal Medicine | Admitting: Internal Medicine

## 2013-04-12 ENCOUNTER — Encounter (HOSPITAL_COMMUNITY): Payer: Self-pay | Admitting: Emergency Medicine

## 2013-04-12 DIAGNOSIS — F10939 Alcohol use, unspecified with withdrawal, unspecified: Secondary | ICD-10-CM | POA: Diagnosis present

## 2013-04-12 DIAGNOSIS — F32A Depression, unspecified: Secondary | ICD-10-CM | POA: Diagnosis present

## 2013-04-12 DIAGNOSIS — IMO0001 Reserved for inherently not codable concepts without codable children: Secondary | ICD-10-CM | POA: Diagnosis present

## 2013-04-12 DIAGNOSIS — F1020 Alcohol dependence, uncomplicated: Secondary | ICD-10-CM

## 2013-04-12 DIAGNOSIS — R000 Tachycardia, unspecified: Secondary | ICD-10-CM | POA: Diagnosis present

## 2013-04-12 DIAGNOSIS — F3289 Other specified depressive episodes: Secondary | ICD-10-CM | POA: Diagnosis present

## 2013-04-12 DIAGNOSIS — Z79899 Other long term (current) drug therapy: Secondary | ICD-10-CM

## 2013-04-12 DIAGNOSIS — F10229 Alcohol dependence with intoxication, unspecified: Principal | ICD-10-CM | POA: Diagnosis present

## 2013-04-12 DIAGNOSIS — E870 Hyperosmolality and hypernatremia: Secondary | ICD-10-CM | POA: Diagnosis present

## 2013-04-12 DIAGNOSIS — F329 Major depressive disorder, single episode, unspecified: Secondary | ICD-10-CM

## 2013-04-12 DIAGNOSIS — F10239 Alcohol dependence with withdrawal, unspecified: Secondary | ICD-10-CM | POA: Diagnosis present

## 2013-04-12 DIAGNOSIS — F1994 Other psychoactive substance use, unspecified with psychoactive substance-induced mood disorder: Secondary | ICD-10-CM

## 2013-04-12 DIAGNOSIS — R748 Abnormal levels of other serum enzymes: Secondary | ICD-10-CM | POA: Diagnosis present

## 2013-04-12 DIAGNOSIS — F10129 Alcohol abuse with intoxication, unspecified: Secondary | ICD-10-CM | POA: Diagnosis present

## 2013-04-12 DIAGNOSIS — F10920 Alcohol use, unspecified with intoxication, uncomplicated: Secondary | ICD-10-CM

## 2013-04-12 DIAGNOSIS — E860 Dehydration: Secondary | ICD-10-CM

## 2013-04-12 LAB — CBC WITH DIFFERENTIAL/PLATELET
Basophils Absolute: 0 10*3/uL (ref 0.0–0.1)
Lymphocytes Relative: 17 % (ref 12–46)
Lymphs Abs: 1.9 10*3/uL (ref 0.7–4.0)
Neutro Abs: 8.5 10*3/uL — ABNORMAL HIGH (ref 1.7–7.7)
Neutrophils Relative %: 77 % (ref 43–77)
Platelets: 350 10*3/uL (ref 150–400)
RBC: 4.62 MIL/uL (ref 4.22–5.81)
RDW: 13.6 % (ref 11.5–15.5)
WBC: 11.1 10*3/uL — ABNORMAL HIGH (ref 4.0–10.5)

## 2013-04-12 LAB — COMPREHENSIVE METABOLIC PANEL
ALT: 36 U/L (ref 0–53)
AST: 30 U/L (ref 0–37)
Alkaline Phosphatase: 90 U/L (ref 39–117)
CO2: 24 mEq/L (ref 19–32)
Calcium: 9 mg/dL (ref 8.4–10.5)
GFR calc Af Amer: >90 mL/min (ref >90)
GFR calc non Af Amer: 85 mL/min — ABNORMAL LOW (ref >90)
Glucose, Bld: 146 mg/dL — ABNORMAL HIGH (ref 70–99)
Potassium: 3.8 mEq/L (ref 3.5–5.1)
Sodium: 151 mEq/L — ABNORMAL HIGH (ref 135–145)
Total Protein: 8 g/dL (ref 6.0–8.3)

## 2013-04-12 LAB — ETHANOL: Alcohol, Ethyl (B): 385 mg/dL — ABNORMAL HIGH (ref 0–11)

## 2013-04-12 MED ORDER — LORAZEPAM 1 MG PO TABS: 1.0000 mg | ORAL_TABLET | Freq: Four times a day (QID) | ORAL | Status: DC | PRN

## 2013-04-12 MED ORDER — FOLIC ACID 1 MG PO TABS
1.0000 mg | ORAL_TABLET | Freq: Every day | ORAL | Status: DC
  Administered 2013-04-13 – 2013-04-14 (×2): 1 mg via ORAL
  Filled 2013-04-12 (×2): qty 1

## 2013-04-12 MED ORDER — ADULT MULTIVITAMIN W/MINERALS CH
1.0000 | ORAL_TABLET | Freq: Every day | ORAL | Status: DC
  Administered 2013-04-13 – 2013-04-14 (×2): 1 via ORAL
  Filled 2013-04-12 (×2): qty 1

## 2013-04-12 MED ORDER — THIAMINE HCL 100 MG/ML IJ SOLN
100.0000 mg | Freq: Every day | INTRAMUSCULAR | Status: DC
  Filled 2013-04-12 (×2): qty 1

## 2013-04-12 MED ORDER — VITAMIN B-1 100 MG PO TABS
100.0000 mg | ORAL_TABLET | Freq: Every day | ORAL | Status: DC
  Administered 2013-04-13 – 2013-04-14 (×2): 100 mg via ORAL
  Filled 2013-04-12 (×2): qty 1

## 2013-04-12 MED ORDER — LORAZEPAM 1 MG PO TABS
1.0000 mg | ORAL_TABLET | Freq: Once | ORAL | Status: AC
  Administered 2013-04-12: 1 mg via ORAL
  Filled 2013-04-12: qty 1

## 2013-04-12 MED ORDER — LORAZEPAM 2 MG/ML IJ SOLN
1.0000 mg | Freq: Four times a day (QID) | INTRAMUSCULAR | Status: DC | PRN
  Administered 2013-04-13 – 2013-04-14 (×3): 1 mg via INTRAVENOUS
  Filled 2013-04-12 (×5): qty 1

## 2013-04-12 MED ORDER — SODIUM CHLORIDE 0.9 % IV BOLUS (SEPSIS)
2000.0000 mL | Freq: Once | INTRAVENOUS | Status: AC
  Administered 2013-04-12: 2000 mL via INTRAVENOUS

## 2013-04-12 MED ORDER — LORAZEPAM 2 MG/ML IJ SOLN
2.0000 mg | Freq: Once | INTRAMUSCULAR | Status: AC
  Administered 2013-04-13: 2 mg via INTRAVENOUS

## 2013-04-12 MED ORDER — SODIUM CHLORIDE 0.9 % IV BOLUS (SEPSIS)
1000.0000 mL | Freq: Once | INTRAVENOUS | Status: AC
  Administered 2013-04-12: 1000 mL via INTRAVENOUS

## 2013-04-12 NOTE — ED Notes (Signed)
Niece: [employer] [address] [place]) [phone]

## 2013-04-12 NOTE — ED Notes (Signed)
[id number]<QI> Expected date:<BR> Expected time:<BR> Means of arrival:<BR> Comments:<BR> EMS, 50 M, ETOH

## 2013-04-12 NOTE — ED Notes (Signed)
EMS called to [place] and [address] [employer].  Security of mall Found patient in car and called [employer].   EMS arrived and found patient in underware with ETOH on board.  Patient denies SI/HI. Patient denies any pain.
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
## 2013-04-12 NOTE — ED Notes (Signed)
Patient advised we will need a urine specimen. Patient states he is unable to void at this time. Patient advised if he is not able to void soon, it may be necessary to catheterize him.

## 2013-04-13 DIAGNOSIS — E870 Hyperosmolality and hypernatremia: Secondary | ICD-10-CM

## 2013-04-13 DIAGNOSIS — E860 Dehydration: Secondary | ICD-10-CM

## 2013-04-13 DIAGNOSIS — F1010 Alcohol abuse, uncomplicated: Secondary | ICD-10-CM

## 2013-04-13 DIAGNOSIS — F1020 Alcohol dependence, uncomplicated: Secondary | ICD-10-CM

## 2013-04-13 DIAGNOSIS — F1994 Other psychoactive substance use, unspecified with psychoactive substance-induced mood disorder: Secondary | ICD-10-CM

## 2013-04-13 DIAGNOSIS — F329 Major depressive disorder, single episode, unspecified: Secondary | ICD-10-CM

## 2013-04-13 LAB — RAPID URINE DRUG SCREEN, HOSP PERFORMED
Amphetamines: NOT DETECTED
Benzodiazepines: NOT DETECTED
Opiates: NOT DETECTED
Tetrahydrocannabinol: NOT DETECTED

## 2013-04-13 LAB — URINALYSIS, ROUTINE W REFLEX MICROSCOPIC
Bilirubin Urine: NEGATIVE
Glucose, UA: NEGATIVE mg/dL
Hgb urine dipstick: NEGATIVE
Specific Gravity, Urine: 1.031 — ABNORMAL HIGH (ref 1.005–1.030)
Urobilinogen, UA: 0.2 mg/dL (ref 0.0–1.0)

## 2013-04-13 LAB — CBC
MCHC: 32.4 g/dL (ref 30.0–36.0)
Platelets: 244 10*3/uL (ref 150–400)
RDW: 13.6 % (ref 11.5–15.5)
WBC: 9.1 10*3/uL (ref 4.0–10.5)

## 2013-04-13 LAB — CREATININE, SERUM
GFR calc Af Amer: >90 mL/min (ref >90)
GFR calc non Af Amer: >90 mL/min (ref >90)

## 2013-04-13 MED ORDER — KCL IN DEXTROSE-NACL 20-5-0.45 MEQ/L-%-% IV SOLN
INTRAVENOUS | Status: DC
  Administered 2013-04-13 – 2013-04-14 (×3): via INTRAVENOUS
  Filled 2013-04-13 (×6): qty 1000

## 2013-04-13 MED ORDER — LORAZEPAM 0.5 MG PO TABS: 0.5000 mg | ORAL_TABLET | Freq: Four times a day (QID) | ORAL | Status: DC | PRN

## 2013-04-13 MED ORDER — METOPROLOL TARTRATE 25 MG PO TABS
25.0000 mg | ORAL_TABLET | Freq: Two times a day (BID) | ORAL | Status: DC
  Administered 2013-04-13 – 2013-04-14 (×4): 25 mg via ORAL
  Filled 2013-04-13 (×5): qty 1

## 2013-04-13 MED ORDER — HYDROCODONE-ACETAMINOPHEN 5-325 MG PO TABS
1.0000 | ORAL_TABLET | ORAL | Status: AC | PRN
  Administered 2013-04-13 (×2): 1 via ORAL
  Filled 2013-04-13 (×2): qty 1

## 2013-04-13 MED ORDER — PANTOPRAZOLE SODIUM 40 MG PO TBEC
40.0000 mg | DELAYED_RELEASE_TABLET | Freq: Every day | ORAL | Status: DC
  Administered 2013-04-13 – 2013-04-14 (×2): 40 mg via ORAL
  Filled 2013-04-13 (×2): qty 1

## 2013-04-13 MED ORDER — DOCUSATE SODIUM 100 MG PO CAPS
100.0000 mg | ORAL_CAPSULE | Freq: Two times a day (BID) | ORAL | Status: DC
  Administered 2013-04-13 – 2013-04-14 (×4): 100 mg via ORAL
  Filled 2013-04-13 (×5): qty 1

## 2013-04-13 MED ORDER — NICOTINE 21 MG/24HR TD PT24
21.0000 mg | MEDICATED_PATCH | Freq: Every day | TRANSDERMAL | Status: DC
  Administered 2013-04-13 – 2013-04-14 (×2): 21 mg via TRANSDERMAL
  Filled 2013-04-13 (×2): qty 1

## 2013-04-13 MED ORDER — ENOXAPARIN SODIUM 40 MG/0.4ML ~~LOC~~ SOLN
40.0000 mg | SUBCUTANEOUS | Status: DC
  Filled 2013-04-13 (×2): qty 0.4

## 2013-04-13 MED ORDER — SODIUM CHLORIDE 0.9 % IJ SOLN
3.0000 mL | Freq: Two times a day (BID) | INTRAMUSCULAR | Status: DC
  Administered 2013-04-13: 3 mL via INTRAVENOUS

## 2013-04-13 MED ORDER — ONDANSETRON HCL 4 MG PO TABS: 4.0000 mg | ORAL_TABLET | Freq: Four times a day (QID) | ORAL | Status: DC | PRN

## 2013-04-13 MED ORDER — ONDANSETRON HCL 4 MG/2ML IJ SOLN
4.0000 mg | Freq: Four times a day (QID) | INTRAMUSCULAR | Status: DC | PRN
  Administered 2013-04-13: 4 mg via INTRAVENOUS
  Filled 2013-04-13: qty 2

## 2013-04-13 MED ORDER — FOLIC ACID 5 MG/ML IJ SOLN
1.0000 mg | Freq: Every day | INTRAMUSCULAR | Status: DC
  Filled 2013-04-13 (×3): qty 0.2

## 2013-04-13 MED ORDER — THIAMINE HCL 100 MG/ML IJ SOLN
100.0000 mg | Freq: Every day | INTRAMUSCULAR | Status: DC
  Filled 2013-04-13: qty 1

## 2013-04-13 NOTE — H&P (Signed)
[facility] History and Physical  NASIIR MONTS ZOX:[id number] DOB: [birth year]    PCP:   No primary provider on file.   Chief Complaint: Feeling anxious.  HPI: Jeffrey Black is an 52 y.o. male with hx of significant alcohol use, continuous, for which he said he drank as much as a gallon of 150 proof alcohol, tobacco use, hx of alcohol withdrawal in the past, found sitting in his car all day, in the heat, brought in by [employer] intoxicated.  He doesn't answer any question and didn't say if he is suicidal or not.  Work up in the ER included a BAL of 385, unremarkable LFTs, CBC, and renal fx tests.  His Na is 151.  In the ER, he was tachycardic with HR 130's and having some withdrawal.  Hospitalist was asked to admit him for intoxication, withdrawal, and depression.  Rewiew of Systems:  Constitutional: Negative for malaise, fever and chills. No significant weight loss or weight gain Eyes: Negative for eye pain, redness and discharge, diplopia, visual changes, or flashes of light. ENMT: Negative for ear pain, hoarseness, nasal congestion, sinus pressure and sore throat. No headaches; tinnitus, drooling, or problem swallowing. Cardiovascular: Negative for chest pain, palpitations, diaphoresis, dyspnea and peripheral edema. ; No orthopnea, PND Respiratory: Negative for cough, hemoptysis, wheezing and stridor. No pleuritic chestpain. Gastrointestinal: Negative for nausea, vomiting, diarrhea, constipation, abdominal pain, melena, blood in stool, hematemesis, jaundice and rectal bleeding.    Genitourinary: Negative for frequency, dysuria, incontinence,flank pain and hematuria; Musculoskeletal: Negative for back pain and neck pain. Negative for swelling and trauma.;  Skin: . Negative for pruritus, rash, abrasions, bruising and skin lesion.; ulcerations Neuro: Negative for headache, lightheadedness and neck stiffness. Negative for weakness, altered level of consciousness , altered mental status,  extremity weakness, burning feet, involuntary movement, seizure and syncope.  Psych: negative for tearfulness, panic attacks, hallucinations, paranoia, suicidal or homicidal ideation    Past Medical History  Diagnosis Date  . ETOH abuse   . Psoriasis     History reviewed. No pertinent past surgical history.  Medications:  HOME MEDS: Prior to Admission medications   Medication Sig Start Date End Date Taking? Authorizing Provider  LORazepam (ATIVAN) 0.5 MG tablet Take 0.5 mg by mouth every 6 (six) hours as needed for anxiety.   Yes Historical Provider, MD  metoprolol tartrate (LOPRESSOR) 12.5 mg TABS Take 6.25 mg by mouth 2 (two) times daily.   Yes Historical Provider, MD  nicotine (NICODERM CQ - DOSED IN MG/24 HOURS) 21 mg/24hr patch Place 1 patch onto the skin daily.   Yes Historical Provider, MD  omeprazole (PRILOSEC) 20 MG capsule Take 20 mg by mouth daily as needed (for acid relux).   Yes Historical Provider, MD     Allergies:  No Known Allergies  Social History:   reports that he has been smoking Cigarettes.  He has a 20 pack-year smoking history. He has never used smokeless tobacco. He reports that  drinks alcohol. He reports that he does not use illicit drugs.  Family History: Family History  Problem Relation Age of Onset  . Stroke Father      Physical Exam: Filed Vitals:   04/12/13 2042 04/12/13 2136  BP: 140/97 137/86  Pulse: 130 130  Temp: 97.8 F (36.6 C)   TempSrc: Oral   Resp:  18  SpO2: 93% 94%   Blood pressure 137/86, pulse 130, temperature 97.8 F (36.6 C), temperature source Oral, resp. rate 18, SpO2 94.00%.  GEN:  Pleasant patient lying in the stretcher in no acute distress; cooperative with exam. Nervous. PSYCH:  alert and oriented x4; does not appear anxious or depressed; affect is appropriate. HEENT: Mucous membranes pink and anicteric; PERRLA; EOM intact; no cervical lymphadenopathy nor thyromegaly or carotid bruit; no JVD; There were no  stridor. Neck is very supple. Breasts:: Not examined CHEST WALL: No tenderness CHEST: Normal respiration, clear to auscultation bilaterally.  HEART: regular and tachy.  There are no murmur, rub, or gallops.   BACK: No kyphosis or scoliosis; no CVA tenderness ABDOMEN: soft and non-tender; no masses, no organomegaly, normal abdominal bowel sounds; no pannus; no intertriginous candida. There is no rebound and no distention. Rectal Exam: Not done EXTREMITIES: No bone or joint deformity; age-appropriate arthropathy of the hands and knees; no edema; no ulcerations.  There is no calf tenderness. Tremulous. Genitalia: not examined PULSES: 2+ and symmetric SKIN: Normal hydration no rash or ulceration CNS: Cranial nerves 2-12 grossly intact no focal lateralizing neurologic deficit.  Speech is fluent; uvula elevated with phonation, facial symmetry and tongue midline. DTR are normal bilaterally, cerebella exam is intact, barbinski is negative and strengths are equaled bilaterally.  No sensory loss.   Labs on Admission:  Basic Metabolic Panel:  Recent Labs Lab 04/12/13 2055  NA 151*  K 3.8  CL 111  CO2 24  GLUCOSE 146*  BUN 15  CREATININE 1.00  CALCIUM 9.0   Liver Function Tests:  Recent Labs Lab 04/12/13 2055  AST 30  ALT 36  ALKPHOS 90  BILITOT 0.2*  PROT 8.0  ALBUMIN 4.1   No results found for this basename: LIPASE, AMYLASE,  in the last 168 hours No results found for this basename: AMMONIA,  in the last 168 hours CBC:  Recent Labs Lab 04/12/13 2055  WBC 11.1*  NEUTROABS 8.5*  HGB 14.6  HCT 44.5  MCV 96.3  PLT 350   Cardiac Enzymes:  Recent Labs Lab 04/12/13 2055  CKTOTAL 133    CBG: No results found for this basename: GLUCAP,  in the last 168 hours   Radiological Exams on Admission: No results found.  Assessment/Plan Present on Admission:  . Alcoholism /alcohol abuse . Alcohol withdrawal syndrome . Alcohol intoxication . Elevated liver enzymes .  Depression  PLAN:  Will admit him to SDU for early withdrawal.  I suspect he will go into more withdrawal given how much he drinks.  Will place on Ativan IV CIWA protocol.  He will be given IVF, folate, and Thiamine. Will use D5 1/2 NS for hypernatremia.   Please consult psychiatry for further treatment.  I will place him on 1:1 sitter as I wasn't able to exclude suicidal ideation though he didn't confirm it.  He is a full code.  Other plans as per orders.  Code Status: FULL Unk Lightning, MD. [facility] Pager [phone] 7pm to 7am.  04/13/2013, 12:53 AM

## 2013-04-13 NOTE — Consult Note (Signed)
Reason for Consult: Alcohol intoxication Referring Physician: Dr. Varney Biles is an 51 y.o. male.  HPI: Patient was admitted to [facility] medical floor after [employer] found him in [place] parking lot and drinking. Patient stated that he has been drinking his whole life and recently was admitted to [facility] ICU and than completed 28 day alcohol rehab treatment at [facility], [place] and currently staying with half way house. Patient has mild tremors but no severe withdrawal symptoms. He has history of DT and shakes in the past. He lives with his dad and his mother passed away with health problems. Patient denied suicidal or homicidal ideations. His BAL of 385 on arrival, unremarkable LFTs, CBC, and renal fx tests. His Na is 151. UDS is negative.   MSE: patient is calm, and cooperative. He has fine mood and calm affect. He has denied suicidal or homicidal ideation, intention and plan. He has no homicidal ideations, intension or plans. He has no evidence of psychosis.   Past Medical History  Diagnosis Date  . ETOH abuse   . Psoriasis     History reviewed. No pertinent past surgical history.  Family History  Problem Relation Age of Onset  . Stroke Father     Social History:  reports that he has been smoking Cigarettes.  He has a 20 pack-year smoking history. He has never used smokeless tobacco. He reports that  drinks alcohol. He reports that he does not use illicit drugs.  Allergies: No Known Allergies  Medications: I have reviewed the patient's current medications.  Results for orders placed during the hospital encounter of 04/12/13 (from the past 48 hour(s))  CBC WITH DIFFERENTIAL     Status: Abnormal   Collection Time    04/12/13  8:55 PM      Result Value Range   WBC 11.1 (*) 4.0 - 10.5 K/uL   RBC 4.62  4.22 - 5.81 MIL/uL   Hemoglobin 14.6  13.0 - 17.0 g/dL   HCT 84.6  96.2 - 95.2 %   MCV 96.3  78.0 - 100.0 fL   MCH 31.6  26.0 - 34.0 pg   MCHC 32.8  30.0 - 36.0 g/dL   RDW 84.1  32.4 - 40.1  %   Platelets 350  150 - 400 K/uL   Neutrophils Relative % 77  43 - 77 %   Neutro Abs 8.5 (*) 1.7 - 7.7 K/uL   Lymphocytes Relative 17  12 - 46 %   Lymphs Abs 1.9  0.7 - 4.0 K/uL   Monocytes Relative 6  3 - 12 %   Monocytes Absolute 0.6  0.1 - 1.0 K/uL   Eosinophils Relative 0  0 - 5 %   Eosinophils Absolute 0.0  0.0 - 0.7 K/uL   Basophils Relative 0  0 - 1 %   Basophils Absolute 0.0  0.0 - 0.1 K/uL  COMPREHENSIVE METABOLIC PANEL     Status: Abnormal   Collection Time    04/12/13  8:55 PM      Result Value Range   Sodium 151 (*) 135 - 145 mEq/L   Potassium 3.8  3.5 - 5.1 mEq/L   Chloride 111  96 - 112 mEq/L   CO2 24  19 - 32 mEq/L   Glucose, Bld 146 (*) 70 - 99 mg/dL   BUN 15  6 - 23 mg/dL   Creatinine, Ser 0.27  0.50 - 1.35 mg/dL   Calcium 9.0  8.4 - 25.3 mg/dL  Total Protein 8.0  6.0 - 8.3 g/dL   Albumin 4.1  3.5 - 5.2 g/dL   AST 30  0 - 37 U/L   ALT 36  0 - 53 U/L   Alkaline Phosphatase 90  39 - 117 U/L   Total Bilirubin 0.2 (*) 0.3 - 1.2 mg/dL   GFR calc non Af Amer 85 (*) >90 mL/min   GFR calc Af Amer >90  >90 mL/min   Comment:            The eGFR has been calculated     using the CKD EPI equation.     This calculation has not been     validated in all clinical     situations.     eGFR's persistently     <90 mL/min signify     possible Chronic Kidney Disease.  CK     Status: None   Collection Time    04/12/13  8:55 PM      Result Value Range   Total CK 133  7 - 232 U/L  ETHANOL     Status: Abnormal   Collection Time    04/12/13  8:55 PM      Result Value Range   Alcohol, Ethyl (B) 385 (*) 0 - 11 mg/dL   Comment:            LOWEST DETECTABLE LIMIT FOR     SERUM ALCOHOL IS 11 mg/dL     FOR MEDICAL PURPOSES ONLY  ACETAMINOPHEN LEVEL     Status: None   Collection Time    04/12/13  8:55 PM      Result Value Range   Acetaminophen (Tylenol), Serum <15.0  10 - 30 ug/mL   Comment:            THERAPEUTIC CONCENTRATIONS VARY     SIGNIFICANTLY. A RANGE OF 10-30      ug/mL MAY BE AN EFFECTIVE     CONCENTRATION FOR MANY PATIENTS.     HOWEVER, SOME ARE BEST TREATED     AT CONCENTRATIONS OUTSIDE THIS     RANGE.     ACETAMINOPHEN CONCENTRATIONS     >150 ug/mL AT 4 HOURS AFTER     INGESTION AND >50 ug/mL AT 12     HOURS AFTER INGESTION ARE     OFTEN ASSOCIATED WITH TOXIC     REACTIONS.  SALICYLATE LEVEL     Status: Abnormal   Collection Time    04/12/13  8:55 PM      Result Value Range   Salicylate Lvl <2.0 (*) 2.8 - 20.0 mg/dL  CG4 I-STAT (LACTIC ACID)     Status: Abnormal   Collection Time    04/12/13 10:15 PM      Result Value Range   Lactic Acid, Venous 2.65 (*) 0.5 - 2.2 mmol/L  CBC     Status: Abnormal   Collection Time    04/13/13  5:15 AM      Result Value Range   WBC 9.1  4.0 - 10.5 K/uL   RBC 3.83 (*) 4.22 - 5.81 MIL/uL   Hemoglobin 12.0 (*) 13.0 - 17.0 g/dL   Comment: DELTA CHECK NOTED     REPEATED TO VERIFY   HCT 37.0 (*) 39.0 - 52.0 %   MCV 96.6  78.0 - 100.0 fL   MCH 31.3  26.0 - 34.0 pg   MCHC 32.4  30.0 - 36.0 g/dL   RDW 57.8  46.9 - 62.9 %  Platelets 244  150 - 400 K/uL   Comment: DELTA CHECK NOTED     REPEATED TO VERIFY  CREATININE, SERUM     Status: None   Collection Time    04/13/13  5:15 AM      Result Value Range   Creatinine, Ser 0.67  0.50 - 1.35 mg/dL   GFR calc non Af Amer >90  >90 mL/min   GFR calc Af Amer >90  >90 mL/min   Comment:            The eGFR has been calculated     using the CKD EPI equation.     This calculation has not been     validated in all clinical     situations.     eGFR's persistently     <90 mL/min signify     possible Chronic Kidney Disease.  TSH     Status: None   Collection Time    04/13/13  5:15 AM      Result Value Range   TSH 0.406  0.350 - 4.500 uIU/mL  URINALYSIS, ROUTINE W REFLEX MICROSCOPIC     Status: Abnormal   Collection Time    04/13/13  6:28 AM      Result Value Range   Color, Urine YELLOW  YELLOW   APPearance CLEAR  CLEAR   Specific Gravity, Urine  1.031 (*) 1.005 - 1.030   pH 5.0  5.0 - 8.0   Glucose, UA NEGATIVE  NEGATIVE mg/dL   Hgb urine dipstick NEGATIVE  NEGATIVE   Bilirubin Urine NEGATIVE  NEGATIVE   Ketones, ur NEGATIVE  NEGATIVE mg/dL   Protein, ur NEGATIVE  NEGATIVE mg/dL   Urobilinogen, UA 0.2  0.0 - 1.0 mg/dL   Nitrite NEGATIVE  NEGATIVE   Leukocytes, UA NEGATIVE  NEGATIVE   Comment: MICROSCOPIC NOT DONE ON URINES WITH NEGATIVE PROTEIN, BLOOD, LEUKOCYTES, NITRITE, OR GLUCOSE <1000 mg/dL.  URINE RAPID DRUG SCREEN (HOSP PERFORMED)     Status: None   Collection Time    04/13/13  6:28 AM      Result Value Range   Opiates NONE DETECTED  NONE DETECTED   Cocaine NONE DETECTED  NONE DETECTED   Benzodiazepines NONE DETECTED  NONE DETECTED   Amphetamines NONE DETECTED  NONE DETECTED   Tetrahydrocannabinol NONE DETECTED  NONE DETECTED   Barbiturates NONE DETECTED  NONE DETECTED   Comment:            DRUG SCREEN FOR MEDICAL PURPOSES     ONLY.  IF CONFIRMATION IS NEEDED     FOR ANY PURPOSE, NOTIFY LAB     WITHIN 5 DAYS.                LOWEST DETECTABLE LIMITS     FOR URINE DRUG SCREEN     Drug Class       Cutoff (ng/mL)     Amphetamine      1000     Barbiturate      200     Benzodiazepine   200     Tricyclics       300     Opiates          300     Cocaine          300     THC              50    No results found.  Positive for anxiety, bad mood, excessive alcohol consumption  and sleep disturbance Blood pressure 138/91, pulse 93, temperature 98.2 F (36.8 C), temperature source Oral, resp. rate 16, height 6' (1.829 m), weight 209 lb 10.5 oz (95.1 kg), SpO2 93.00%.   Assessment/Plan: Alcohol intoxication Alcohol dependence  Recommendation: 1. Patient is psychiatrically cleared and recommend out patient substance abuse treatment.  2. Patient has no safety issues and contract for safety.  3. No medication recommended at this visit. 4. Appreciate psych consult 5. May contact psych social service for disposition  plans if needed.  Maven Varelas,JANARDHAHA R. 04/13/2013, 11:54 AM

## 2013-04-13 NOTE — Progress Notes (Signed)
[facility] PROGRESS NOTE  Jeffrey Black UEA:[id number] DOB: 07/19/1961 DOA: 04/12/2013 PCP: No primary provider on file.  Assessment/Plan: 1-Alcoholism /alcohol abuse// Alcohol withdrawal syndrome: continue with CIWA protocol. Continue with thiamine and folate.  2-Depression, alcohol abuse: Psych consulted.  3-Hypernatremia: Continue with IV fluids. Repeat electrolytes in am.      Code Status: Full  Family Communication: Care discussed with patient.  Disposition Plan: home when stable.    Consultants:  Psych  Procedures:  none  Antibiotics:  none  HPI/Subjective: Feeling well. Denies suicidal thought.   Objective: Filed Vitals:   04/13/13 0059 04/13/13 0219 04/13/13 0538 04/13/13 1357  BP: 137/86 153/90 138/91 156/92  Pulse: 130 118 93 88  Temp:  98.1 F (36.7 C) 98.2 F (36.8 C) 98.5 F (36.9 C)  TempSrc:  Oral Oral Oral  Resp:   16 18  Height:  6' (1.829 m)    Weight:  95.1 kg (209 lb 10.5 oz)    SpO2:  95% 93% 94%    Intake/Output Summary (Last 24 hours) at 04/13/13 1415 Last data filed at 04/13/13 0600  Gross per 24 hour  Intake 688.33 ml  Output    700 ml  Net -11.67 ml   Filed Weights   04/13/13 0219  Weight: 95.1 kg (209 lb 10.5 oz)    Exam:   General:  No distress.   Cardiovascular: S 1, S 2 RRR  Respiratory: CTA  Abdomen: Bs present, soft, NT  Musculoskeletal: no edema.   Data Reviewed: Basic Metabolic Panel:  Recent Labs Lab 04/12/13 2055 04/13/13 0515  NA 151*  --   K 3.8  --   CL 111  --   CO2 24  --   GLUCOSE 146*  --   BUN 15  --   CREATININE 1.00 0.67  CALCIUM 9.0  --    Liver Function Tests:  Recent Labs Lab 04/12/13 2055  AST 30  ALT 36  ALKPHOS 90  BILITOT 0.2*  PROT 8.0  ALBUMIN 4.1   No results found for this basename: LIPASE, AMYLASE,  in the last 168 hours No results found for this basename: AMMONIA,  in the last 168 hours CBC:  Recent Labs Lab 04/12/13 2055 04/13/13 0515  WBC  11.1* 9.1  NEUTROABS 8.5*  --   HGB 14.6 12.0*  HCT 44.5 37.0*  MCV 96.3 96.6  PLT 350 244   Cardiac Enzymes:  Recent Labs Lab 04/12/13 2055  CKTOTAL 133   BNP (last 3 results)  Recent Labs  11/08/12 0540  PROBNP 34.6   CBG: No results found for this basename: GLUCAP,  in the last 168 hours  No results found for this or any previous visit (from the past 240 hour(s)).   Studies: No results found.  Scheduled Meds: . docusate sodium  100 mg Oral BID  . enoxaparin (LOVENOX) injection  40 mg Subcutaneous Q24H  . folic acid  1 mg Oral Daily  . folic acid  1 mg Intravenous Daily  . metoprolol tartrate  25 mg Oral BID  . multivitamin with minerals  1 tablet Oral Daily  . nicotine  21 mg Transdermal Daily  . pantoprazole  40 mg Oral Daily  . sodium chloride  3 mL Intravenous Q12H  . thiamine  100 mg Oral Daily   Or  . thiamine  100 mg Intravenous Daily   Continuous Infusions: . dextrose 5 % and 0.45 % NaCl with KCl 20 mEq/L 125 mL/hr at 04/13/13 0320  Principal Problem:   Alcohol intoxication Active Problems:   Alcoholism /alcohol abuse   Alcohol withdrawal syndrome   Elevated liver enzymes   Depression    Time spent: 25 minutes.     Jeffrey Black  [facility] Pager [phone]. If 7PM-7AM, please contact night-coverage at [email or web address], password [facility] 04/13/2013, 2:15 PM  LOS: 1 day

## 2013-04-13 NOTE — Progress Notes (Signed)
Clinical Social Work Department CLINICAL SOCIAL WORK PSYCHIATRY SERVICE LINE ASSESSMENT 04/13/2013  Patient:  Jeffrey Black  Account:  [id number]  Admit Date:  04/12/2013  Clinical Social Worker:  Unk Lightning, LCSW  Date/Time:  04/13/2013 10:45 AM Referred by:  Physician  Date referred:  04/13/2013 Reason for Referral  Substance Abuse   Presenting Symptoms/Problems (In the person's/family's own words):   Psych consulted to assess for SI and due to substance use.   Abuse/Neglect/Trauma History (check all that apply)  Denies history   Abuse/Neglect/Trauma Comments:   Psychiatric History (check all that apply)  Outpatient treatment  Residential treatment   Psychiatric medications:  None currently. Patient reports he took antidepressants for about 1 year in 100s and then again in 2007. Patient reports he is interested in talking with psychiatrist about medication options.   Current Mental Health Hospitalizations/Previous Mental Health History:   Patient has had follow up at [facility] in 2007. Patient aware of [facility] now and voiced understanding of programs offered. Patient attends AA meetings on daily basis. Patient has been to [facility] in the past and was DC from [facility] about 2 weeks ago.   Current provider:   None   Place and Date:   N/A   Current Medications:   HYDROcodone-acetaminophen, LORazepam, LORazepam, LORazepam, ondansetron (ZOFRAN) IV, ondansetron            . docusate sodium  100 mg Oral BID  . enoxaparin (LOVENOX) injection  40 mg Subcutaneous Q24H  . folic acid  1 mg Oral Daily  . folic acid  1 mg Intravenous Daily  . metoprolol tartrate  25 mg Oral BID  . multivitamin with minerals  1 tablet Oral Daily  . nicotine  21 mg Transdermal Daily  . pantoprazole  40 mg Oral Daily  . sodium chloride  3 mL Intravenous Q12H  . thiamine  100 mg Oral Daily   Or     . thiamine  100 mg Intravenous Daily   Previous Impatient  Admission/Date/Reason:   Patient denies any psychiatric hospitalizations. Patient has been to inpatient substance abuse programs.   Emotional Health / Current Symptoms    Suicide/Self Harm  Other harmful behavior (ex: homicidal ideation) (describe)   Suicide attempt in the past:   Patient denies any current SI or HI. Patient reports that 23 years ago he had thoughts to shoot himself in the head. Patient reports he thought about his newborn dtr and would never want to hurt himself because it would cause her pain.   Other harmful behavior:   Patient has a long hx of substance use.   Psychotic/Dissociative Symptoms  None reported   Other Psychotic/Dissociative Symptoms:   Patient denies any psychotic features.    Attention/Behavioral Symptoms  Within Normal Limits   Other Attention / Behavioral Symptoms:   Patient engaged throughout assessment and agreeable to speak with CSW.    Cognitive Impairment  Orientation - Place  Orientation - Self  Orientation - Situation  Orientation - Time   Other Cognitive Impairment:    Mood and Adjustment  Flat    Stress, Anxiety, Trauma, Any Recent Loss/Stressor  Relationship   Anxiety (frequency):   N/A   Phobia (specify):   N/A   Compulsive behavior (specify):   N/A   Obsessive behavior (specify):   N/A   Other:   Patient reports that father placed a restraining order against him.   Substance Abuse/Use  Substance abuse treatment needed  Current  substance use   SBIRT completed (please refer for detailed history):  Y  Self-reported substance use:   Patient reports he has been drinking alcohol since he was a teenager. Patient acknowledges a drinking problem and has been to treatment in the past. Patient reports that he was sober for 8 years at one time and was doing well. Patient reports he was fired from his job in December 2013 due to not showing up to work after drinking too much. Patient is currently living in a halfway  house and recently DC from inpatient substance abuse treatment. Patient was sober for 2 weeks before he relapsed on day of admission.   Urinary Drug Screen Completed:  Y Alcohol level:   385    Environmental/Housing/Living Arrangement  With Other Relatives   Who is in the home:   Patient lives in a halfway house   Emergency contact:  Kay-sister   Financial  IPRS   Patient's Strengths and Goals (patient's own words):   Patient acknowledges drinking habits and has been agreeable to treatment.   Clinical Social Worker's Interpretive Summary:   CSW received referral to complete psychosocial assessment. ED MD was concerned that patient was attempting suicide by the amount of alcohol that was consumed. CSW reviewed chart and met with patient at bedside. No visitors were present during time of assessment. CSW introduced myself and explained role. Patient was agreeable to talk with CSW.    Patient reports that he was admitted after drinking a fifth of Ever[place]. Patient reports that he had been sober for 2 weeks prior to relapse but reports that after an argument with father, patient felt he needed to consume alcohol. Patient reports that he used to live with mother and father but father placed a restraining order against him in order to get him removed from the home. Patient tried to go and visit father yesterday and father contacted the police. Patient left father's home and started drinking in the parking lot.    Patient has been in and out of treatment facilities over the years. Patient reports that he recently DC from [facility] and was sober for 2 weeks. Patient currently living in a halfway house and is living off his savings. Patient lost his job in December and reports he has been looking for another job but is struggling due to substance use.    Patient denies any MH diagnosis currently or in the past. Patient states that he went to [facility] and was placed  on antidepressants to assist with alcohol withdraw. Patient reports no current alcohol withdraws and reports that he usually only has a dry mouth when withdrawing.Patient possibly interested in antidepressant medications again.    Patient denies any SI or HI. Patient questioning if sitter can be DC. CSW explained the MD has to make that decision. Patient reports that he regrets drinking but has not thoughts of hurting himself. Patient reports he does not want to go back to inpatient treatment and prefers to continue to follow up with AA meetings. Patient reports he could consider returning to [facility] for additional support.    CSW will staff case with psych MD and assist with any recommendations provided.   Disposition:  Recommend Psych CSW continuing to support while in hospital

## 2013-04-14 ENCOUNTER — Inpatient Hospital Stay (HOSPITAL_COMMUNITY)
Admission: EM | Admit: 2013-04-14 | Discharge: 2013-04-17 | DRG: 918 | Disposition: A | Discharge status: Other Acute Inpatient Hospital | Payer: PRIVATE HEALTH INSURANCE | Attending: Internal Medicine | Admitting: Internal Medicine

## 2013-04-14 ENCOUNTER — Encounter (HOSPITAL_COMMUNITY): Payer: Self-pay

## 2013-04-14 DIAGNOSIS — E876 Hypokalemia: Secondary | ICD-10-CM

## 2013-04-14 DIAGNOSIS — T50991A Poisoning by other drugs, medicaments and biological substances, accidental (unintentional), initial encounter: Principal | ICD-10-CM | POA: Diagnosis present

## 2013-04-14 DIAGNOSIS — T50992A Poisoning by other drugs, medicaments and biological substances, intentional self-harm, initial encounter: Secondary | ICD-10-CM | POA: Diagnosis present

## 2013-04-14 DIAGNOSIS — F1010 Alcohol abuse, uncomplicated: Secondary | ICD-10-CM

## 2013-04-14 DIAGNOSIS — F17200 Nicotine dependence, unspecified, uncomplicated: Secondary | ICD-10-CM | POA: Diagnosis present

## 2013-04-14 DIAGNOSIS — F1994 Other psychoactive substance use, unspecified with psychoactive substance-induced mood disorder: Secondary | ICD-10-CM | POA: Diagnosis present

## 2013-04-14 DIAGNOSIS — F10929 Alcohol use, unspecified with intoxication, unspecified: Secondary | ICD-10-CM

## 2013-04-14 DIAGNOSIS — Z781 Physical restraint status: Secondary | ICD-10-CM | POA: Diagnosis present

## 2013-04-14 DIAGNOSIS — F10239 Alcohol dependence with withdrawal, unspecified: Secondary | ICD-10-CM

## 2013-04-14 DIAGNOSIS — F329 Major depressive disorder, single episode, unspecified: Secondary | ICD-10-CM

## 2013-04-14 DIAGNOSIS — F1020 Alcohol dependence, uncomplicated: Secondary | ICD-10-CM

## 2013-04-14 DIAGNOSIS — Y92009 Unspecified place in unspecified non-institutional (private) residence as the place of occurrence of the external cause: Secondary | ICD-10-CM

## 2013-04-14 DIAGNOSIS — F3289 Other specified depressive episodes: Secondary | ICD-10-CM

## 2013-04-14 DIAGNOSIS — IMO0001 Reserved for inherently not codable concepts without codable children: Secondary | ICD-10-CM | POA: Diagnosis present

## 2013-04-14 DIAGNOSIS — T447X2A Poisoning by beta-adrenoreceptor antagonists, intentional self-harm, initial encounter: Secondary | ICD-10-CM

## 2013-04-14 DIAGNOSIS — R748 Abnormal levels of other serum enzymes: Secondary | ICD-10-CM

## 2013-04-14 DIAGNOSIS — D696 Thrombocytopenia, unspecified: Secondary | ICD-10-CM

## 2013-04-14 DIAGNOSIS — F32A Depression, unspecified: Secondary | ICD-10-CM

## 2013-04-14 DIAGNOSIS — Z79899 Other long term (current) drug therapy: Secondary | ICD-10-CM

## 2013-04-14 DIAGNOSIS — F10939 Alcohol use, unspecified with withdrawal, unspecified: Secondary | ICD-10-CM

## 2013-04-14 DIAGNOSIS — T50901A Poisoning by unspecified drugs, medicaments and biological substances, accidental (unintentional), initial encounter: Secondary | ICD-10-CM

## 2013-04-14 LAB — BASIC METABOLIC PANEL
BUN: 9 mg/dL (ref 6–23)
Chloride: 103 mEq/L (ref 96–112)
Creatinine, Ser: 0.76 mg/dL (ref 0.50–1.35)
GFR calc Af Amer: >90 mL/min (ref >90)
GFR calc non Af Amer: >90 mL/min (ref >90)
Glucose, Bld: 99 mg/dL (ref 70–99)

## 2013-04-14 LAB — CBC
Hemoglobin: 11.9 g/dL — ABNORMAL LOW (ref 13.0–17.0)
MCH: 31.9 pg (ref 26.0–34.0)
MCHC: 34.4 g/dL (ref 30.0–36.0)
MCV: 93.5 fL (ref 78.0–100.0)
Platelets: 224 10*3/uL (ref 150–400)
Platelets: 309 10*3/uL (ref 150–400)
RDW: 12.9 % (ref 11.5–15.5)

## 2013-04-14 LAB — COMPREHENSIVE METABOLIC PANEL
ALT: 31 U/L (ref 0–53)
AST: 28 U/L (ref 0–37)
Albumin: 3.8 g/dL (ref 3.5–5.2)
Calcium: 9.1 mg/dL (ref 8.4–10.5)
GFR calc Af Amer: 72 mL/min — ABNORMAL LOW (ref >90)
Sodium: 140 mEq/L (ref 135–145)
Total Protein: 7.5 g/dL (ref 6.0–8.3)

## 2013-04-14 LAB — SALICYLATE LEVEL: Salicylate Lvl: <2 mg/dL — ABNORMAL LOW (ref 2.8–20.0)

## 2013-04-14 MED ORDER — SODIUM CHLORIDE 0.9 % IV BOLUS (SEPSIS)
1000.0000 mL | Freq: Once | INTRAVENOUS | Status: AC
  Administered 2013-04-14: 1000 mL via INTRAVENOUS

## 2013-04-14 MED ORDER — LORAZEPAM 1 MG PO TABS
1.0000 mg | ORAL_TABLET | Freq: Four times a day (QID) | ORAL | Status: DC | PRN
  Filled 2013-04-14: qty 1

## 2013-04-14 MED ORDER — FOLIC ACID 1 MG PO TABS
1.0000 mg | ORAL_TABLET | Freq: Every day | ORAL | Status: DC
  Administered 2013-04-14 – 2013-04-17 (×4): 1 mg via ORAL
  Filled 2013-04-14 (×4): qty 1

## 2013-04-14 MED ORDER — CHLORDIAZEPOXIDE HCL 10 MG PO CAPS: ORAL_CAPSULE | ORAL | Status: DC

## 2013-04-14 MED ORDER — THIAMINE HCL 100 MG/ML IJ SOLN
100.0000 mg | Freq: Every day | INTRAMUSCULAR | Status: DC
  Filled 2013-04-14: qty 1

## 2013-04-14 MED ORDER — ADULT MULTIVITAMIN W/MINERALS CH
1.0000 | ORAL_TABLET | Freq: Every day | ORAL | Status: DC
  Administered 2013-04-14: 1 via ORAL
  Filled 2013-04-14 (×2): qty 1

## 2013-04-14 MED ORDER — LORAZEPAM 2 MG/ML IJ SOLN
1.0000 mg | Freq: Four times a day (QID) | INTRAMUSCULAR | Status: DC | PRN
  Administered 2013-04-15: 1 mg via INTRAVENOUS
  Filled 2013-04-14 (×2): qty 1

## 2013-04-14 MED ORDER — FOLIC ACID 1 MG PO TABS: 1.0000 mg | ORAL_TABLET | Freq: Every day | ORAL | Status: DC

## 2013-04-14 MED ORDER — VITAMIN B-1 100 MG PO TABS
100.0000 mg | ORAL_TABLET | Freq: Every day | ORAL | Status: DC
  Administered 2013-04-14 – 2013-04-17 (×4): 100 mg via ORAL
  Filled 2013-04-14 (×4): qty 1

## 2013-04-14 MED ORDER — METOPROLOL TARTRATE 25 MG PO TABS: 25.0000 mg | ORAL_TABLET | Freq: Two times a day (BID) | ORAL | Status: DC

## 2013-04-14 MED ORDER — CHLORDIAZEPOXIDE HCL 25 MG PO CAPS
25.0000 mg | ORAL_CAPSULE | Freq: Four times a day (QID) | ORAL | Status: DC | PRN
  Administered 2013-04-14: 25 mg via ORAL
  Filled 2013-04-14: qty 1

## 2013-04-14 NOTE — Discharge Summary (Signed)
Physician Discharge Summary  DIQUAN [name]:[id number] DOB: 11/23/1960 DOA: 04/12/2013  PCP: No primary provider on file.  Admit date: 04/12/2013 Discharge date: 04/14/2013  Time spent:  minutes  Recommendations for Outpatient Follow-up:  1. Need a b-met to follow renal and electrolytes.   Discharge Diagnoses:    Alcohol intoxication   Alcoholism /alcohol abuse   Alcohol withdrawal syndrome   Elevated liver enzymes   Depression   Discharge Condition: stable.  Diet recommendation: [employer] Healthy  Filed Weights   04/13/13 0219  Weight: 95.1 kg (209 lb 10.5 oz)    History of present illness:  Jeffrey Black is an 52 y.o. male with hx of significant alcohol use, continuous, for which he said he drank as much as a gallon of 150 proof alcohol, tobacco use, hx of alcohol withdrawal in the past, found sitting in his car all day, in the heat, brought in by [employer] intoxicated. He doesn't answer any question and didn't say if he is suicidal or not. Work up in the ER included a BAL of 385, unremarkable LFTs, CBC, and renal fx tests. His Na is 151. In the ER, he was tachycardic with HR 130's and having some withdrawal. Hospitalist was asked to admit him for intoxication, withdrawal, and depression   Hospital Course:  1-Alcoholism /alcohol abuse// Alcohol withdrawal syndrome: Patient was monitor on CIWA protocol. He received multiple ativan doses.  Continue with thiamine and folate. Patient was clear by psychiatrist. He will be discharge on low dose librium. SW will make sure patient can return to detox rehab and will help with taxi. Will ask CM to make sure patient will be able to afford medications.  2-Depression, alcohol abuse: Psych consulted.  3-Hypernatremia: Continue with IV fluids. Repeat electrolytes in am.     Procedures:  none  Consultations:  Psych  Discharge Exam: Filed Vitals:   04/13/13 1357 04/13/13 2118 04/14/13 0521 04/14/13 0648  BP: 156/92 149/88 157/95 152/94   Pulse: 88 84 74   Temp: 98.5 F (36.9 C) 99.2 F (37.3 C) 98.7 F (37.1 C)   TempSrc: Oral Oral Oral   Resp: 18 18 18    Height:      Weight:      SpO2: 94% 94% 95%     General: no distress. Cardiovascular: S 1, S 2 RRR Respiratory: CTA  Discharge Instructions  Discharge Orders   Future Orders Complete By Expires     Diet - low sodium heart healthy  As directed     Increase activity slowly  As directed         Medication List    STOP taking these medications       LORazepam 0.5 MG tablet  Commonly known as:  ATIVAN      TAKE these medications       chlordiazePOXIDE 10 MG capsule  Commonly known as:  LIBRIUM  Take 2 tablet by mouth three times a day for 1 day then 1 tablet three times a day for one day then 1 tablet twice a day for one day, the one table once ad ay then stop.     folic acid 1 MG tablet  Commonly known as:  FOLVITE  Take 1 tablet (1 mg total) by mouth daily.     metoprolol tartrate 25 MG tablet  Commonly known as:  LOPRESSOR  Take 1 tablet (25 mg total) by mouth 2 (two) times daily.     nicotine 21 mg/24hr patch  Commonly known as:  NICODERM CQ - dosed in mg/24 hours  Place 1 patch onto the skin daily.     omeprazole 20 MG capsule  Commonly known as:  PRILOSEC  Take 20 mg by mouth daily as needed (for acid relux).       No Known Allergies    The results of significant diagnostics from this hospitalization (including imaging, microbiology, ancillary and laboratory) are listed below for reference.    Significant Diagnostic Studies: No results found.  Microbiology: No results found for this or any previous visit (from the past 240 hour(s)).   Labs: Basic Metabolic Panel:  Recent Labs Lab 04/12/13 2055 04/13/13 0515 04/14/13 0455  NA 151*  --  139  K 3.8  --  3.5  CL 111  --  103  CO2 24  --  27  GLUCOSE 146*  --  99  BUN 15  --  9  CREATININE 1.00 0.67 0.76  CALCIUM 9.0  --  9.3   Liver Function Tests:  Recent  Labs Lab 04/12/13 2055  AST 30  ALT 36  ALKPHOS 90  BILITOT 0.2*  PROT 8.0  ALBUMIN 4.1   No results found for this basename: LIPASE, AMYLASE,  in the last 168 hours No results found for this basename: AMMONIA,  in the last 168 hours CBC:  Recent Labs Lab 04/12/13 2055 04/13/13 0515 04/14/13 0455  WBC 11.1* 9.1 9.4  NEUTROABS 8.5*  --   --   HGB 14.6 12.0* 11.9*  HCT 44.5 37.0* 36.2*  MCV 96.3 96.6 93.5  PLT 350 244 224   Cardiac Enzymes:  Recent Labs Lab 04/12/13 2055  CKTOTAL 133   BNP: BNP (last 3 results)  Recent Labs  11/08/12 0540  PROBNP 34.6   CBG: No results found for this basename: GLUCAP,  in the last 168 hours     Signed:  Debora Stockdale  [facility] 04/14/2013, 9:46 AM

## 2013-04-14 NOTE — ED Notes (Signed)
EKG given to EDP,Allen,MD. 

## 2013-04-14 NOTE — ED Provider Notes (Addendum)
Medical screening examination/treatment/procedure(s) were conducted as a shared visit with non-physician practitioner(s) and myself.  I personally evaluated the patient during the encounter  Pt seen and examined--etoh on board, denies SI--will encourage pt to seek detox  11:36 PM Patient became combative and would not follow commands. He wanted to leave the department and his hypertension was noted as well to his alcohol. He initially did agree to have an IV fluids but then he wanted to leave. He lacks capacity. He was placed under involuntary commitment. Was also placed in 4 point restraints for her safety. Patient likely took about 19 tablets of metoprolol at unknown time. He required observation in the hospital   CRITICAL CARE Performed by: Toy Baker Total critical care time: 40 Critical care time was exclusive of separately billable procedures and treating other patients. Critical care was necessary to treat or prevent imminent or life-threatening deterioration. Critical care was time spent personally by me on the following activities: development of treatment plan with patient and/or surrogate as well as nursing, discussions with consultants, evaluation of patient's response to treatment, examination of patient, obtaining history from patient or surrogate, ordering and performing treatments and interventions, ordering and review of laboratory studies, ordering and review of radiographic studies, pulse oximetry and re-evaluation of patient's condition.   Toy Baker, MD 04/14/13 8469  Toy Baker, MD 04/14/13 [phone]

## 2013-04-14 NOTE — ED Notes (Signed)
Pt. Reminded of urinalysis, uncooperative, threw the urinal on the floor after  using it.

## 2013-04-14 NOTE — ED Notes (Signed)
pts friends state that patient is a chronic alcoholic and today he took several blood pressure pills but thought he was taking something that was going to make him high.

## 2013-04-14 NOTE — Progress Notes (Addendum)
Patient discharge home, alert and oriented, discharge instructions given, patient verbalize understanding of orders given My Chart not access at this time, will access later at home, patient in stable condition at this time

## 2013-04-14 NOTE — ED Provider Notes (Signed)
History     CSN: [id number]  Arrival date & time 04/14/13  2007   First MD Initiated Contact with Patient 04/14/13 2115      Chief Complaint  Patient presents with  . Drug Overdose    (Consider location/radiation/quality/duration/timing/severity/associated sxs/prior treatment) The history is provided by the patient, a friend and medical records. No language interpreter was used.    Jeffrey Black is a 52 y.o. male  with a hx of alcoholism presents to the Emergency Department complaining of gradual, persistent, progressively worsening alcohol intoxication beginning on Sunday and continuing through today. Pt was living at a recovery house.  Pt was with his friend on Saturday and fell off the wagon on Sunday and became drunk.  Pt reports drinking more than one 5th of everclear today.  Today in an effort to get high, he took many of his home medications, but does not know how many of each.  Pt gives a poor history based on his alcohol intoxication and a friend supplies most of the hx.  Pt is a level 5 caveat 2/2 EtOH intoxication.    Past Medical History  Diagnosis Date  . ETOH abuse   . Psoriasis     History reviewed. No pertinent past surgical history.  Family History  Problem Relation Age of Onset  . Stroke Father     History  Substance Use Topics  . Smoking status: Current Every Day Smoker -- 1.00 packs/day for 20 years    Types: Cigarettes  . Smokeless tobacco: Never Used  . Alcohol Use: Yes     Comment: drinks 1 pt per day      Review of Systems  Unable to perform ROS: Other  Constitutional: Negative for fever, diaphoresis, appetite change, fatigue and unexpected weight change.  HENT: Negative for mouth sores and neck stiffness.   Eyes: Negative for visual disturbance.  Respiratory: Negative for cough, chest tightness, shortness of breath and wheezing.   Cardiovascular: Negative for chest pain.  Gastrointestinal: Negative for nausea, vomiting, abdominal pain,  diarrhea and constipation.  Endocrine: Negative for polydipsia, polyphagia and polyuria.  Genitourinary: Negative for dysuria, urgency, frequency and hematuria.  Musculoskeletal: Negative for back pain.  Skin: Negative for rash.  Allergic/Immunologic: Negative for immunocompromised state.  Neurological: Negative for syncope, light-headedness and headaches.  Hematological: Does not bruise/bleed easily.  Psychiatric/Behavioral: Negative for sleep disturbance. The patient is not nervous/anxious.     Allergies  Review of patient's allergies indicates no known allergies.  Home Medications   Current Outpatient Rx  Name  Route  Sig  Dispense  Refill  . chlordiazePOXIDE (LIBRIUM) 10 MG capsule      Take 2 tablet by mouth three times a day for 1 day then 1 tablet three times a day for one day then 1 tablet twice a day for one day, the one table once ad ay then stop.   12 capsule   0   . folic acid (FOLVITE) 1 MG tablet   Oral   Take 1 mg by mouth daily.         . metoprolol tartrate (LOPRESSOR) 25 MG tablet   Oral   Take 12.5 mg by mouth 2 (two) times daily.         Marland Kitchen omeprazole (PRILOSEC) 20 MG capsule   Oral   Take 20 mg by mouth daily as needed (for acid relux).           BP 99/62  Pulse 85  Resp 18  SpO2 95%  Physical Exam  Nursing note and vitals reviewed. Constitutional: He is oriented to person, place, and time. He appears well-developed and well-nourished. No distress.  HENT:  Head: Normocephalic and atraumatic.  Mouth/Throat: Oropharynx is clear and moist. No oropharyngeal exudate.  Eyes: Conjunctivae are normal. Pupils are equal, round, and reactive to light. No scleral icterus.  Neck: Normal range of motion. Neck supple.  Cardiovascular: Normal rate, regular rhythm, normal heart sounds and intact distal pulses.   No murmur heard. Pulmonary/Chest: Effort normal and breath sounds normal. No respiratory distress. He has no wheezes. He has no rales. He  exhibits no tenderness.  Abdominal: Soft. Bowel sounds are normal. He exhibits no mass. There is no tenderness. There is no rebound and no guarding.  Musculoskeletal: Normal range of motion. He exhibits no edema.  Lymphadenopathy:    He has no cervical adenopathy.  Neurological: He is alert and oriented to person, place, and time. He exhibits normal muscle tone. Coordination normal.  Pt able to orient, but unable to carry on a conversation or give a hx due to intoxication Moves extremities without ataxia  Skin: Skin is warm and dry. No rash noted. He is not diaphoretic. No erythema.  No wounds noted  Psychiatric: He has a normal mood and affect.    ED Course  Procedures (including critical care time)  Labs Reviewed  COMPREHENSIVE METABOLIC PANEL - Abnormal; Notable for the following:    Glucose, Bld 116 (*)    GFR calc non Af Amer 62 (*)    GFR calc Af Amer 72 (*)    All other components within normal limits  ETHANOL - Abnormal; Notable for the following:    Alcohol, Ethyl (B) 336 (*)    All other components within normal limits  SALICYLATE LEVEL - Abnormal; Notable for the following:    Salicylate Lvl <2.0 (*)    All other components within normal limits  CBC  ACETAMINOPHEN LEVEL  URINE RAPID DRUG SCREEN (HOSP PERFORMED)  TSH  TROPONIN I   No results found.  ECG:  Date: 04/14/2013  Rate: 79  Rhythm: normal sinus rhythm  QRS Axis: normal  Intervals: QT prolonged  ST/T Wave abnormalities: normal  Conduction Disutrbances:none  Narrative Interpretation:   Old EKG Reviewed: unchanged   1. Overdose, initial encounter   2. Alcoholism   3. Current use of beta blocker   4. Depression     CRITICAL CARE Performed by: Dierdre Forth Total critical care time: Critical care time was exclusive of separately billable procedures and treating other patients. Critical care was necessary to treat or prevent imminent or life-threatening deterioration. Critical care  was time spent personally by me on the following activities: development of treatment plan with patient and/or surrogate as well as nursing, discussions with consultants, evaluation of patient's response to treatment, examination of patient, obtaining history from patient or surrogate, ordering and performing treatments and interventions, ordering and review of laboratory studies, ordering and review of radiographic studies, pulse oximetry and re-evaluation of patient's condition.   MDM  Evelena Peat presents with non suicidal beta blocker overdose.  Pt denies SI/HI, auditory and visual hallucinations.  Pt belligerent and agitated in the department.  He is unable to give a hx due to his intoxication making him a level 5 caveat.    Medication reconciliation shows that pt took an entire bottle 29 tabs of folic acid 1mg  pills as well as 19 tabs of Metoprolol 25mg  pills. Patient's ECG is  no ischemic and he is not bradycardic however his blood pressure is low in the 90s systolic.  Due to patient blood pressure, significant alcohol intoxication and amount of beta blocker ingested, I feel that overnight observation is warranted.  Dr. Freida Busman was consulted, evaluated this patient with me and agrees with the plan.           Dahlia Client Railey Glad, PA-C 04/15/13 [phone]

## 2013-04-15 ENCOUNTER — Encounter (HOSPITAL_COMMUNITY): Payer: Self-pay

## 2013-04-15 DIAGNOSIS — F329 Major depressive disorder, single episode, unspecified: Secondary | ICD-10-CM

## 2013-04-15 DIAGNOSIS — T50901A Poisoning by unspecified drugs, medicaments and biological substances, accidental (unintentional), initial encounter: Secondary | ICD-10-CM

## 2013-04-15 DIAGNOSIS — F10239 Alcohol dependence with withdrawal, unspecified: Secondary | ICD-10-CM

## 2013-04-15 DIAGNOSIS — F1020 Alcohol dependence, uncomplicated: Secondary | ICD-10-CM

## 2013-04-15 DIAGNOSIS — F1010 Alcohol abuse, uncomplicated: Secondary | ICD-10-CM

## 2013-04-15 LAB — COMPREHENSIVE METABOLIC PANEL
ALT: 27 U/L (ref 0–53)
AST: 23 U/L (ref 0–37)
Alkaline Phosphatase: 76 U/L (ref 39–117)
CO2: 20 mEq/L (ref 19–32)
Calcium: 8.2 mg/dL — ABNORMAL LOW (ref 8.4–10.5)
Chloride: 106 mEq/L (ref 96–112)
GFR calc Af Amer: >90 mL/min (ref >90)
GFR calc non Af Amer: >90 mL/min (ref >90)
Glucose, Bld: 82 mg/dL (ref 70–99)
Potassium: 3.7 mEq/L (ref 3.5–5.1)
Sodium: 142 mEq/L (ref 135–145)
Total Bilirubin: 0.3 mg/dL (ref 0.3–1.2)

## 2013-04-15 LAB — RAPID URINE DRUG SCREEN, HOSP PERFORMED
Amphetamines: NOT DETECTED
Benzodiazepines: NOT DETECTED
Opiates: NOT DETECTED

## 2013-04-15 LAB — TROPONIN I: Troponin I: <0.3 ng/mL (ref <0.30)

## 2013-04-15 MED ORDER — CHLORDIAZEPOXIDE HCL 25 MG PO CAPS
25.0000 mg | ORAL_CAPSULE | Freq: Four times a day (QID) | ORAL | Status: AC
  Administered 2013-04-15 (×4): 25 mg via ORAL

## 2013-04-15 MED ORDER — CHLORDIAZEPOXIDE HCL 25 MG PO CAPS
25.0000 mg | ORAL_CAPSULE | Freq: Four times a day (QID) | ORAL | Status: DC | PRN
  Filled 2013-04-15 (×5): qty 1

## 2013-04-15 MED ORDER — ONDANSETRON HCL 4 MG/2ML IJ SOLN: 4.0000 mg | Freq: Four times a day (QID) | INTRAMUSCULAR | Status: DC | PRN

## 2013-04-15 MED ORDER — HYDROXYZINE HCL 25 MG PO TABS
25.0000 mg | ORAL_TABLET | Freq: Four times a day (QID) | ORAL | Status: DC | PRN
  Filled 2013-04-15: qty 1

## 2013-04-15 MED ORDER — ACETAMINOPHEN 650 MG RE SUPP: 650.0000 mg | Freq: Four times a day (QID) | RECTAL | Status: DC | PRN

## 2013-04-15 MED ORDER — ONDANSETRON HCL 4 MG PO TABS: 4.0000 mg | ORAL_TABLET | Freq: Four times a day (QID) | ORAL | Status: DC | PRN

## 2013-04-15 MED ORDER — CHLORDIAZEPOXIDE HCL 25 MG PO CAPS: 25.0000 mg | ORAL_CAPSULE | Freq: Every day | ORAL | Status: DC

## 2013-04-15 MED ORDER — CHLORDIAZEPOXIDE HCL 25 MG PO CAPS
25.0000 mg | ORAL_CAPSULE | Freq: Three times a day (TID) | ORAL | Status: AC
  Administered 2013-04-16 (×3): 25 mg via ORAL
  Filled 2013-04-15 (×2): qty 1

## 2013-04-15 MED ORDER — ACETAMINOPHEN 325 MG PO TABS
650.0000 mg | ORAL_TABLET | Freq: Four times a day (QID) | ORAL | Status: DC | PRN
  Administered 2013-04-15 – 2013-04-17 (×3): 650 mg via ORAL
  Filled 2013-04-15 (×3): qty 2

## 2013-04-15 MED ORDER — ADULT MULTIVITAMIN W/MINERALS CH
1.0000 | ORAL_TABLET | Freq: Every day | ORAL | Status: DC
  Administered 2013-04-15 – 2013-04-17 (×3): 1 via ORAL
  Filled 2013-04-15 (×3): qty 1

## 2013-04-15 MED ORDER — LOPERAMIDE HCL 2 MG PO CAPS: 2.0000 mg | ORAL_CAPSULE | ORAL | Status: DC | PRN

## 2013-04-15 MED ORDER — SODIUM CHLORIDE 0.9 % IV SOLN
INTRAVENOUS | Status: AC
  Administered 2013-04-15: 150 mL/h via INTRAVENOUS
  Administered 2013-04-15: 01:00:00 via INTRAVENOUS

## 2013-04-15 MED ORDER — VITAMIN B-1 100 MG PO TABS: 100.0000 mg | ORAL_TABLET | Freq: Every day | ORAL | Status: DC

## 2013-04-15 MED ORDER — LORAZEPAM 2 MG/ML IJ SOLN
0.5000 mg | Freq: Once | INTRAMUSCULAR | Status: AC
  Administered 2013-04-15: 0.5 mg via INTRAVENOUS

## 2013-04-15 MED ORDER — CHLORDIAZEPOXIDE HCL 25 MG PO CAPS
25.0000 mg | ORAL_CAPSULE | ORAL | Status: DC
  Administered 2013-04-17: 25 mg via ORAL
  Filled 2013-04-15: qty 1

## 2013-04-15 MED ORDER — ONDANSETRON 4 MG PO TBDP
4.0000 mg | ORAL_TABLET | Freq: Four times a day (QID) | ORAL | Status: DC | PRN
  Filled 2013-04-15: qty 1

## 2013-04-15 MED ORDER — FOLIC ACID 1 MG PO TABS: 1.0000 mg | ORAL_TABLET | Freq: Every day | ORAL | Status: DC

## 2013-04-15 MED ORDER — HYDROCODONE-ACETAMINOPHEN 5-325 MG PO TABS
1.0000 | ORAL_TABLET | Freq: Once | ORAL | Status: AC
  Administered 2013-04-15: 1 via ORAL
  Filled 2013-04-15: qty 1

## 2013-04-15 MED ORDER — THIAMINE HCL 100 MG/ML IJ SOLN: 100.0000 mg | Freq: Once | INTRAMUSCULAR | Status: DC

## 2013-04-15 MED ORDER — ENOXAPARIN SODIUM 40 MG/0.4ML ~~LOC~~ SOLN
40.0000 mg | SUBCUTANEOUS | Status: DC
  Filled 2013-04-15 (×3): qty 0.4

## 2013-04-15 MED ORDER — ONDANSETRON HCL 4 MG/2ML IJ SOLN: 4.0000 mg | Freq: Three times a day (TID) | INTRAMUSCULAR | Status: DC | PRN

## 2013-04-15 NOTE — ED Notes (Addendum)
Pt. Found walking out of his room, able to untie 4 pts., restraints. Uncooperative, restless, and attempted to get out of the unit / out of department. Security called for assistance, pt. Put back to bed and restraints reapplied, reassessed and safety measures applied. Charge Nurse, MD aware, and GDP at bedside.

## 2013-04-15 NOTE — Progress Notes (Signed)
[id number]/Rhonda Davis, RN, BSN, CCM:  CHART REVIEWED AND UPDATED.  Next chart review due on 06022014. NO DISCHARGE NEEDS PRESENT AT THIS TIME. CASE MANAGEMENT [phone] 

## 2013-04-15 NOTE — Progress Notes (Signed)
Clinical Social Work Department CLINICAL SOCIAL WORK PSYCHIATRY SERVICE LINE ASSESSMENT 04/15/2013  Patient:  Jeffrey Black  Account:  [id number]  Admit Date:  04/14/2013  Clinical Social Worker:  Unk Lightning, LCSW  Date/Time:  04/15/2013 11:30 AM Referred by:  Physician  Date referred:  04/15/2013 Reason for Referral  Substance Abuse   Presenting Symptoms/Problems (In the person's/family's own words):   Psych consulted due to possible overdose on medication and substance abuse.   Abuse/Neglect/Trauma History (check all that apply)  Denies history   Abuse/Neglect/Trauma Comments:   None reported   Psychiatric History (check all that apply)  Outpatient treatment  Residential treatment   Psychiatric medications:  None   Current Mental Health Hospitalizations/Previous Mental Health History:   Patient reports he attends daily AA meetings.   Current provider:   None   Place and Date:   N/A   Current Medications:   acetaminophen, acetaminophen, chlordiazePOXIDE, hydrOXYzine, loperamide, ondansetron (ZOFRAN) IV, ondansetron, ondansetron            . [COMPLETED] sodium chloride   Intravenous STAT  . chlordiazePOXIDE  25 mg Oral QID   Followed by     . [START ON 04/16/2013] chlordiazePOXIDE  25 mg Oral TID   Followed by     . [START ON 04/17/2013] chlordiazePOXIDE  25 mg Oral BH-qamhs   Followed by     . [START ON 04/18/2013] chlordiazePOXIDE  25 mg Oral Daily  . enoxaparin (LOVENOX) injection  40 mg Subcutaneous Q24H  . folic acid  1 mg Oral Daily  . multivitamin with minerals  1 tablet Oral Daily  . thiamine  100 mg Oral Daily   Previous Impatient Admission/Date/Reason:   Patient recently DC from [facility]. Patient has been to [facility] in the past as well.   Emotional Health / Current Symptoms    Suicide/Self Harm  None reported   Suicide attempt in the past:   Patient reports he took 4 of his blood pressure pills. Patient reports he misread the bottle and  took 4 instead of 2. Patient reports he was overwhelmed and drank alcohol but it was not an attempt to hurt himself.   Other harmful behavior:   Patient is a chronic substance abuser.   Psychotic/Dissociative Symptoms  None reported   Other Psychotic/Dissociative Symptoms:    Attention/Behavioral Symptoms  Within Normal Limits   Other Attention / Behavioral Symptoms:   Patient engaged and appropriate throughout assessment.    Cognitive Impairment  Poor Judgement   Other Cognitive Impairment:   Patient continues to drink alcohol despite having options for treatment and knowing the harmful side effects.    Mood and Adjustment  Flat    Stress, Anxiety, Trauma, Any Recent Loss/Stressor  Relationship  Other - See comment   Anxiety (frequency):   N/A   Phobia (specify):   N/A   Compulsive behavior (specify):   N/A   Obsessive behavior (specify):   N/A   Other:   Patient reports strained relationship with family. Patient feels that halfway house is no longer a positive environment and plans to move.   Substance Abuse/Use  Current substance use  Substance abuse treatment needed   SBIRT completed (please refer for detailed history):  Y  Self-reported substance use:   Patient reports he drank less than a pint of vodka on day of admission. Patient denies any further substance use. Patient reports that he accidentally took too many blood pressure pills but denies any suicidal thoughts.  Urinary Drug Screen Completed:  Y Alcohol level:   336    Environmental/Housing/Living Arrangement  With Other Relatives   Who is in the home:   Patient currently living in halfway house but reports he wants to get his own apartment.   Emergency contact:  Chief Strategy Officer   Patient's Strengths and Goals (patient's own words):   Patient reports he wants to remain sober and will attend AA meetings.   Clinical Social Worker's Interpretive Summary:    CSW received referral to complete psychosocial assessment. CSW is familiar with patient due to patient's recent DC from the hospital. CSW met with patient at bedside. Patient familiar with CSW as well.    Patient reports he left the hospital and went to his sister's house to get his car and money from her. Patient reports it was a positive visit and he is grateful that his sister allowed him to visit her. Patient reports that he went back to his halfway house and was upset because he thought someone stole his money. Patient reports he used to think that the halfway house was helpful but now feels he cannot trust others living there. Patient reports he is supposed to take a blood pressure pill two times daily but misread the label and took 2 pills twice that day. Patient denies this was a suicide attempt.    Patient completed SBIRT and CSW discussed inpatient treatment with patient. Patient feels he has adequate coping skills and is able to remain sober. CSW encouraged patient to think about his plan and decide if he felt he could remain sober without treatment. Patient reports that AA meetings are beneficial and he can remain sober.    CSW and patient had direct conversation regarding patient's stressors increasing during a move and patient dc without stable housing. Patient appears to minimize effects of alcohol and continues to refuse treatment. Patient scored high on SBIRT screening and could benefit from treatment.    Patient understands he is under IVC but reports he feels he should be able to DC. Patient does not have concrete plan and from previous experience has proved he was not successful with remaining sober. Patient could greatly benefit from inpatient treatment but refusing treatment.    CSW will staff case with psych MD and follow any recommendations provided.   Disposition:  Recommend Psych CSW continuing to support while in hospital
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
## 2013-04-15 NOTE — Progress Notes (Signed)
Patient states in our discussion that he did not have ideas of harming himself, aware that he is IVC due to his OD/ETOH and behavior.  He has a CIWA score of 1, placed on telemetry box # 59, notified central telemetry and on continuous pulse ox.  He has chronic pain in his shoulders which he rates at a 4.  A sitter is present and reason for sitter discussed with the patient.  Jeanice Lim CSW had discussion with patient regarding psychiatric evaluation pending.  He is resting comfortably and does not to appear to be distressed at this time

## 2013-04-15 NOTE — H&P (Addendum)
[facility] History and Physical  JAYANT KRIZ GMW:[id number] DOB: 10-Mar-1961 DOA: 04/14/2013  Referring physician:    PCP: No primary provider on file.   Chief Complaint: Drug Overdose    HPI:  52 year old history of alcoholism recently admitted and discharged on 5/29 presents to the ED with alcohol intoxication and intentional drug overdose. The patient folic acid and 25 tablets of metoprolol XL. He was found to be altered and noncooperative. He also became combative in the ED and had to be placed in 4. point restraints. He was deemed to have lack of capacity and was placed under involuntary commitment. At this time is not following any commands EtOH level was 336. UDS was negative     Review of Systems: Unable to obtain      Past Medical History  Diagnosis Date  . ETOH abuse   . Psoriasis      History reviewed. No pertinent past surgical history.    Social History:  reports that he has been smoking Cigarettes.  He has a 20 pack-year smoking history. He has never used smokeless tobacco. He reports that  drinks alcohol. He reports that he does not use illicit drugs.    No Known Allergies  Family History  Problem Relation Age of Onset  . Stroke Father      Prior to Admission medications   Medication Sig Start Date End Date Taking? Authorizing Provider  chlordiazePOXIDE (LIBRIUM) 10 MG capsule Take 2 tablet by mouth three times a day for 1 day then 1 tablet three times a day for one day then 1 tablet twice a day for one day, the one table once ad ay then stop. 04/14/13  Yes Belkys A Regalado, MD  folic acid (FOLVITE) 1 MG tablet Take 1 mg by mouth daily.   Yes Historical Provider, MD  metoprolol tartrate (LOPRESSOR) 25 MG tablet Take 12.5 mg by mouth 2 (two) times daily.   Yes Historical Provider, MD  omeprazole (PRILOSEC) 20 MG capsule Take 20 mg by mouth daily as needed (for acid relux).   Yes Historical Provider, MD     Physical Exam: Filed Vitals:    04/14/13 2014 04/14/13 2237 04/14/13 2240 04/14/13 2244  BP: 92/59 95/56 99/56  99/62  Pulse: 80 84 85 85  Resp: 18     SpO2: 95%        Constitutional: Vital signs reviewed. Patient is a well-developed and well-nourished in no acute distress and cooperative with exam. Confused and combative Head: Normocephalic and atraumatic  Ear: TM normal bilaterally  Mouth: no erythema or exudates, MMM  Eyes: PERRL, EOMI, conjunctivae normal, No scleral icterus.  Neck: Supple, Trachea midline normal ROM, No JVD, mass, thyromegaly, or carotid bruit present.  Cardiovascular: RRR, S1 normal, S2 normal, no MRG, pulses symmetric and intact bilaterally  Pulmonary/Chest: CTAB, no wheezes, rales, or rhonchi  Abdominal: Soft. Non-tender, non-distended, bowel sounds are normal, no masses, organomegaly, or guarding present.  GU: no CVA tenderness Musculoskeletal: No joint deformities, erythema, or stiffness, ROM full and no nontender Ext: no edema and no cyanosis, pulses palpable bilaterally (DP and PT)  Hematology: no cervical, inginal, or axillary adenopathy.  Neurological: A&O x3, Strenght is normal and symmetric bilaterally, cranial nerve II-XII are grossly intact, no focal motor deficit, sensory intact to light touch bilaterally.  Skin: Warm, dry and intact. No rash, cyanosis, or clubbing.  Psychiatric: Normal mood and affect. speech and behavior is normal. Judgment and thought content normal. Cognition and memory are normal.  Labs on Admission:    Basic Metabolic Panel:  Recent Labs Lab 04/12/13 2055 04/13/13 0515 04/14/13 0455 04/14/13 2115  NA 151*  --  139 140  K 3.8  --  3.5 3.6  CL 111  --  103 102  CO2 24  --  27 21  GLUCOSE 146*  --  99 116*  BUN 15  --  9 10  CREATININE 1.00 0.67 0.76 1.29  CALCIUM 9.0  --  9.3 9.1   Liver Function Tests:  Recent Labs Lab 04/12/13 2055 04/14/13 2115  AST 30 28  ALT 36 31  ALKPHOS 90 97  BILITOT 0.2* 0.5  PROT 8.0 7.5  ALBUMIN 4.1  3.8   No results found for this basename: LIPASE, AMYLASE,  in the last 168 hours No results found for this basename: AMMONIA,  in the last 168 hours CBC:  Recent Labs Lab 04/12/13 2055 04/13/13 0515 04/14/13 0455 04/14/13 2115  WBC 11.1* 9.1 9.4 10.2  NEUTROABS 8.5*  --   --   --   HGB 14.6 12.0* 11.9* 13.6  HCT 44.5 37.0* 36.2* 39.5  MCV 96.3 96.6 93.5 92.7  PLT 350 244 224 309   Cardiac Enzymes:  Recent Labs Lab 04/12/13 2055  CKTOTAL 133    BNP (last 3 results)  Recent Labs  11/08/12 0540  PROBNP 34.6      CBG: No results found for this basename: GLUCAP,  in the last 168 hours  Radiological Exams on Admission: No results found.  EKG: Independently reviewed. EKG showed normal sinus rhythm with a rate of 79  Assessment/Plan  Intentional drug overdose/alcohol intoxication He has completed 28 days of alcohol rehabilitation recently Seen by psychiatry on 5/28 Had signs of alcohol withdrawal during his most recent admission He has had delerium tremor for the past He was cleared by psychiatry Under involuntary commitment Admit to step down May need to be transferred to inpatient psychiatry CIWA protocol Please call for psychiatric consultation in the morning  Given metoprolol XL overdose will monitor in step down   Code Status:   full Family Communication: bedside Disposition Plan: admit   Time spent: 70 mins   [facility] Pager [phone]  If 7PM-7AM, please contact night-coverage [email or web address] Password [facility] 04/15/2013, 12:13 AM
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
## 2013-04-15 NOTE — ED Notes (Signed)
IVC papers brought in by [facility]

## 2013-04-16 ENCOUNTER — Telehealth (HOSPITAL_COMMUNITY): Payer: Self-pay | Admitting: Licensed Clinical Social Worker

## 2013-04-16 LAB — BASIC METABOLIC PANEL
BUN: 13 mg/dL (ref 6–23)
Calcium: 9.3 mg/dL (ref 8.4–10.5)
Creatinine, Ser: 0.83 mg/dL (ref 0.50–1.35)
GFR calc Af Amer: >90 mL/min (ref >90)
GFR calc non Af Amer: >90 mL/min (ref >90)

## 2013-04-16 MED ORDER — NICOTINE 21 MG/24HR TD PT24
21.0000 mg | MEDICATED_PATCH | Freq: Every day | TRANSDERMAL | Status: DC
  Administered 2013-04-16 – 2013-04-17 (×2): 21 mg via TRANSDERMAL
  Filled 2013-04-16 (×2): qty 1

## 2013-04-16 NOTE — Progress Notes (Signed)
Per Tim at [facility], facility able to accept Pt today to 300, bed 1.  Notified RN.  RN to facilitate admission to [facility].  Pt to be d/c'd.  Providence Crosby, LCSWA Clinical Social Work [phone]

## 2013-04-16 NOTE — Discharge Summary (Addendum)
Physician Discharge Summary  Jeffrey Black ZOX:[id number] DOB: [birth year] DOA: 04/14/2013  PCP: No primary provider on file.  Admit date: 04/14/2013 Discharge date: 04/17/2013  Time spent: 35 minutes  Recommendations for Outpatient Follow-up:  1. Follow up with PCP in 2 week to check blood pressure.  Discharge Diagnoses:  Principal Problem:   Suicide attempt by beta blocker overdose Active Problems:   Alcoholism /alcohol abuse   Drug overdose   Discharge Condition: stable  Diet recommendation: heart healthy  Filed Weights   04/16/13 0930  Weight: 94.348 kg (208 lb)    History of present illness:  Please refer to H&P for further details 52 year old history of alcoholism recently admitted and discharged on 5/29 presents to the ED with alcohol intoxication and intentional drug overdose. The patient folic acid and 25 tablets of metoprolol XL. He was found to be altered and noncooperative. He also became combative in the ED and had to be placed in 4. point restraints. He was deemed to have lack of capacity and was placed under involuntary commitment. At this time is not following any commands  EtOH level was 336. UDS was negative   Hospital Course:  Drug overdose:  - It is unlikely that he took large quantities of metoprolol, as actually his pulse was high initially when he came in. He does admit that he took extra doses of metoprolol but it was unintentional. He did this because he was getting mad and he checked his blood pressure and it was high.   - He doesn't have any suicidal thoughts, psychiatry has been consulted recommended inpatient acute psyq care.   Alcoholism /alcohol abuse:  -  Librium tapared as an outpatient.   Procedures:  None  Consultations:  psyq.  Discharge Exam: Filed Vitals:   04/16/13 1332 04/16/13 2345 04/17/13 0545 04/17/13 0648  BP: 137/89 147/97 107/69 121/77  Pulse: 81 70 69   Temp: 98.5 F (36.9 C) 97.6 F (36.4 C) 97.6 F (36.4 C)    TempSrc: Oral Axillary Oral   Resp: 16 16 14    Height:      Weight:      SpO2: 97% 98% 97%     General: A&O x3 Cardiovascular: RRR Respiratory: good air movement CTA B/L  Discharge Instructions      Discharge Orders   Future Orders Complete By Expires     Diet - low sodium heart healthy  As directed     Increase activity slowly  As directed         Medication List    TAKE these medications       chlordiazePOXIDE 10 MG capsule  Commonly known as:  LIBRIUM  Take 2 tablet by mouth three times a day for 1 day then 1 tablet three times a day for one day then 1 tablet twice a day for one day, the one table once ad ay then stop.     folic acid 1 MG tablet  Commonly known as:  FOLVITE  Take 1 mg by mouth daily.     metoprolol tartrate 25 MG tablet  Commonly known as:  LOPRESSOR  Take 12.5 mg by mouth 2 (two) times daily.     omeprazole 20 MG capsule  Commonly known as:  PRILOSEC  Take 20 mg by mouth daily as needed (for acid relux).       No Known Allergies    The results of significant diagnostics from this hospitalization (including imaging, microbiology, ancillary and laboratory) are listed below  for reference.    Significant Diagnostic Studies: No results found.  Microbiology: Recent Results (from the past 240 hour(s))  MRSA PCR SCREENING     Status: None   Collection Time    04/15/13  3:32 AM      Result Value Range Status   MRSA by PCR NEGATIVE  NEGATIVE Final   Comment:            The GeneXpert MRSA Assay (FDA     approved for NASAL specimens     only), is one component of a     comprehensive MRSA colonization     surveillance program. It is not     intended to diagnose MRSA     infection nor to guide or     monitor treatment for     MRSA infections.     Labs: Basic Metabolic Panel:  Recent Labs Lab 04/12/13 2055 04/13/13 0515 04/14/13 0455 04/14/13 2115 04/15/13 0605 04/16/13 0535  NA 151*  --  139 140 142 135  K 3.8  --  3.5 3.6  3.7 3.5  CL 111  --  103 102 106 99  CO2 24  --  27 21 20 27   GLUCOSE 146*  --  99 116* 82 90  BUN 15  --  9 10 12 13   CREATININE 1.00 0.67 0.76 1.29 0.88 0.83  CALCIUM 9.0  --  9.3 9.1 8.2* 9.3   Liver Function Tests:  Recent Labs Lab 04/12/13 2055 04/14/13 2115 04/15/13 0605  AST 30 28 23   ALT 36 31 27  ALKPHOS 90 97 76  BILITOT 0.2* 0.5 0.3  PROT 8.0 7.5 6.2  ALBUMIN 4.1 3.8 3.1*   No results found for this basename: LIPASE, AMYLASE,  in the last 168 hours No results found for this basename: AMMONIA,  in the last 168 hours CBC:  Recent Labs Lab 04/12/13 2055 04/13/13 0515 04/14/13 0455 04/14/13 2115  WBC 11.1* 9.1 9.4 10.2  NEUTROABS 8.5*  --   --   --   HGB 14.6 12.0* 11.9* 13.6  HCT 44.5 37.0* 36.2* 39.5  MCV 96.3 96.6 93.5 92.7  PLT 350 244 224 309   Cardiac Enzymes:  Recent Labs Lab 04/12/13 2055 04/15/13 0040 04/15/13 0605 04/15/13 1151  CKTOTAL 133  --   --   --   TROPONINI  --  <0.30 <0.30 <0.30   BNP: BNP (last 3 results)  Recent Labs  11/08/12 0540  PROBNP 34.6   CBG: No results found for this basename: GLUCAP,  in the last 168 hours     Signed:  Marinda Elk  [facility] 04/17/2013, 10:05 AM
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
## 2013-04-16 NOTE — Care Management Note (Signed)
CM consult for medication assistance. Pt ineligible for [facility] program related to private payor source. No other needs identified at this time.   Roxy Manns Miosotis Wetsel,RN,BSN [phone]

## 2013-04-17 ENCOUNTER — Inpatient Hospital Stay (HOSPITAL_COMMUNITY)
Admission: AD | Admit: 2013-04-17 | Discharge: 2013-04-20 | DRG: 897 | Disposition: A | Discharge status: Home / Self Care | Payer: PRIVATE HEALTH INSURANCE | Source: Intra-hospital | Attending: Psychiatry | Admitting: Psychiatry

## 2013-04-17 ENCOUNTER — Encounter (HOSPITAL_COMMUNITY): Payer: Self-pay | Admitting: *Deleted

## 2013-04-17 DIAGNOSIS — T447X2A Poisoning by beta-adrenoreceptor antagonists, intentional self-harm, initial encounter: Secondary | ICD-10-CM

## 2013-04-17 DIAGNOSIS — F1010 Alcohol abuse, uncomplicated: Secondary | ICD-10-CM

## 2013-04-17 DIAGNOSIS — Z79899 Other long term (current) drug therapy: Secondary | ICD-10-CM

## 2013-04-17 DIAGNOSIS — F332 Major depressive disorder, recurrent severe without psychotic features: Secondary | ICD-10-CM

## 2013-04-17 DIAGNOSIS — F32A Depression, unspecified: Secondary | ICD-10-CM | POA: Diagnosis present

## 2013-04-17 DIAGNOSIS — F1994 Other psychoactive substance use, unspecified with psychoactive substance-induced mood disorder: Secondary | ICD-10-CM

## 2013-04-17 DIAGNOSIS — F1020 Alcohol dependence, uncomplicated: Principal | ICD-10-CM | POA: Diagnosis present

## 2013-04-17 DIAGNOSIS — F329 Major depressive disorder, single episode, unspecified: Secondary | ICD-10-CM | POA: Diagnosis present

## 2013-04-17 DIAGNOSIS — L408 Other psoriasis: Secondary | ICD-10-CM | POA: Diagnosis present

## 2013-04-17 LAB — HEPATIC FUNCTION PANEL
AST: 22 U/L (ref 0–37)
Albumin: 4 g/dL (ref 3.5–5.2)
Total Bilirubin: 0.2 mg/dL — ABNORMAL LOW (ref 0.3–1.2)
Total Protein: 7.7 g/dL (ref 6.0–8.3)

## 2013-04-17 MED ORDER — PANTOPRAZOLE SODIUM 40 MG PO TBEC
40.0000 mg | DELAYED_RELEASE_TABLET | Freq: Every day | ORAL | Status: DC
  Administered 2013-04-17 – 2013-04-20 (×4): 40 mg via ORAL
  Filled 2013-04-17 (×7): qty 1

## 2013-04-17 MED ORDER — CHLORDIAZEPOXIDE HCL 25 MG PO CAPS
25.0000 mg | ORAL_CAPSULE | ORAL | Status: AC
  Administered 2013-04-19 – 2013-04-20 (×2): 25 mg via ORAL
  Filled 2013-04-17 (×2): qty 1

## 2013-04-17 MED ORDER — LOPERAMIDE HCL 2 MG PO CAPS: 2.0000 mg | ORAL_CAPSULE | ORAL | Status: DC | PRN

## 2013-04-17 MED ORDER — ADULT MULTIVITAMIN W/MINERALS CH
1.0000 | ORAL_TABLET | Freq: Every day | ORAL | Status: DC
  Administered 2013-04-18 – 2013-04-20 (×3): 1 via ORAL
  Filled 2013-04-17 (×6): qty 1

## 2013-04-17 MED ORDER — ACETAMINOPHEN 325 MG PO TABS
650.0000 mg | ORAL_TABLET | Freq: Four times a day (QID) | ORAL | Status: DC | PRN
  Administered 2013-04-17: 650 mg via ORAL

## 2013-04-17 MED ORDER — MAGNESIUM HYDROXIDE 400 MG/5ML PO SUSP: 30.0000 mL | Freq: Every day | ORAL | Status: DC | PRN

## 2013-04-17 MED ORDER — THIAMINE HCL 100 MG/ML IJ SOLN: 100.0000 mg | Freq: Once | INTRAMUSCULAR | Status: DC

## 2013-04-17 MED ORDER — CHLORDIAZEPOXIDE HCL 25 MG PO CAPS
50.0000 mg | ORAL_CAPSULE | Freq: Once | ORAL | Status: AC
  Administered 2013-04-17: 50 mg via ORAL
  Filled 2013-04-17: qty 2

## 2013-04-17 MED ORDER — CHLORDIAZEPOXIDE HCL 25 MG PO CAPS
25.0000 mg | ORAL_CAPSULE | Freq: Three times a day (TID) | ORAL | Status: AC
  Administered 2013-04-18 – 2013-04-19 (×3): 25 mg via ORAL
  Filled 2013-04-17 (×2): qty 1

## 2013-04-17 MED ORDER — METOPROLOL TARTRATE 12.5 MG HALF TABLET
12.5000 mg | ORAL_TABLET | Freq: Two times a day (BID) | ORAL | Status: DC
  Administered 2013-04-17 – 2013-04-20 (×6): 12.5 mg via ORAL
  Filled 2013-04-17 (×11): qty 1

## 2013-04-17 MED ORDER — CHLORDIAZEPOXIDE HCL 25 MG PO CAPS
25.0000 mg | ORAL_CAPSULE | Freq: Four times a day (QID) | ORAL | Status: AC
  Administered 2013-04-17 – 2013-04-18 (×3): 25 mg via ORAL
  Filled 2013-04-17 (×4): qty 1

## 2013-04-17 MED ORDER — QUETIAPINE FUMARATE 50 MG PO TABS
50.0000 mg | ORAL_TABLET | Freq: Every day | ORAL | Status: DC
  Administered 2013-04-17: 50 mg via ORAL
  Filled 2013-04-17 (×3): qty 1

## 2013-04-17 MED ORDER — NICOTINE 21 MG/24HR TD PT24
21.0000 mg | MEDICATED_PATCH | Freq: Every day | TRANSDERMAL | Status: DC
  Administered 2013-04-18: 21 mg via TRANSDERMAL
  Filled 2013-04-17 (×3): qty 1

## 2013-04-17 MED ORDER — VITAMIN B-1 100 MG PO TABS
100.0000 mg | ORAL_TABLET | Freq: Every day | ORAL | Status: DC
  Administered 2013-04-18 – 2013-04-20 (×3): 100 mg via ORAL
  Filled 2013-04-17 (×5): qty 1

## 2013-04-17 MED ORDER — ALUM & MAG HYDROXIDE-SIMETH 200-200-20 MG/5ML PO SUSP: 30.0000 mL | ORAL | Status: DC | PRN

## 2013-04-17 MED ORDER — CHLORDIAZEPOXIDE HCL 25 MG PO CAPS: 25.0000 mg | ORAL_CAPSULE | Freq: Every day | ORAL | Status: DC

## 2013-04-17 MED ORDER — ESCITALOPRAM OXALATE 5 MG PO TABS
5.0000 mg | ORAL_TABLET | Freq: Every day | ORAL | Status: DC
  Administered 2013-04-17 – 2013-04-18 (×2): 5 mg via ORAL
  Filled 2013-04-17 (×4): qty 1

## 2013-04-17 MED ORDER — HYDROXYZINE HCL 25 MG PO TABS
25.0000 mg | ORAL_TABLET | Freq: Four times a day (QID) | ORAL | Status: DC | PRN
  Administered 2013-04-17 – 2013-04-18 (×2): 25 mg via ORAL

## 2013-04-17 MED ORDER — CHLORDIAZEPOXIDE HCL 25 MG PO CAPS
25.0000 mg | ORAL_CAPSULE | Freq: Four times a day (QID) | ORAL | Status: DC | PRN
  Administered 2013-04-17: 25 mg via ORAL
  Filled 2013-04-17: qty 1

## 2013-04-17 MED ORDER — ONDANSETRON 4 MG PO TBDP: 4.0000 mg | ORAL_TABLET | Freq: Four times a day (QID) | ORAL | Status: DC | PRN

## 2013-04-17 NOTE — Progress Notes (Signed)
Prepared to assist with coordination to [facility] but after effort exerted was informed previous requested assistance not needed.  Pt en route to [facility]

## 2013-04-17 NOTE — Progress Notes (Signed)
Psychoeducational Group Note  Date:  04/17/2013 Time:  0945 am  Group Topic/Focus:  Making Healthy Choices:   The focus of this group is to help patients identify negative/unhealthy choices they were using prior to admission and identify positive/healthier coping strategies to replace them upon discharge.  Participation Level:  Active  Participation Quality:  Appropriate  Affect:  Appropriate  Cognitive:  Alert and Appropriate  Insight:  Developing/Improving  Engagement in Group:  Developing/Improving  Additional Comments:    Layth Cerezo J 04/17/2013, 10:29 AM 

## 2013-04-17 NOTE — Progress Notes (Signed)
Adult Psychoeducational Group Note  Date:  04/17/2013 Time:  4:20 PM  Group Topic/Focus:  Goals Group:   The focus of this group is to help patients establish daily goals to achieve during treatment and discuss how the patient can incorporate goal setting into their daily lives to aide in recovery.  Participation Level:  Minimal  Participation Quality:  Appropriate, Attentive and Sharing  Affect:  Appropriate  Cognitive:  Alert and Appropriate  Insight: Appropriate and Good  Engagement in Group:  Engaged  Modes of Intervention:  Discussion and Education  Additional Comments:  Group read "Ending the cycle" from Sunday programming workbook out loud and then discussed. Group was then asked to form a goal, thinking about the "Ending the cycle" reading and what it meant to them. Pt gave good insight to others during discussion.   Jeffrey Black 04/17/2013, 4:20 PM
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
## 2013-04-17 NOTE — [facility] Group Notes (Signed)
[facility] Group Notes: (Clinical Social Work)   04/17/2013      Type of Therapy:  Group Therapy   Participation Level:  Did Not Attend    Ambrose Mantle, LCSW 04/17/2013, 1:19 PM

## 2013-04-17 NOTE — Progress Notes (Signed)
D.  Pt pleasant on approach, guarded.  Denies complaints at this time.  Somewhat apprehensive about whether or not he will be able to sleep with low dose of Seroquel ordered.  Denies SI/HI/hallucinations at this time.  Positive for evening group.  Interacting appropriately within milieu.  A.  Support and encouragement offered, medication given as ordered.  R.  Pt remains safe on unit, will continue to monitor.

## 2013-04-17 NOTE — [facility] Suicide Risk Assessment (Signed)
Suicide Risk Assessment  Admission Assessment     Nursing information obtained from:  Patient Demographic factors:  Male;Caucasian;Living alone Current Mental Status:  NA Loss Factors:  Financial problems / change in socioeconomic status Historical Factors:  Family history of mental illness or substance abuse Risk Reduction Factors:  Sense of responsibility to family  CLINICAL FACTORS:   Depression:   Anhedonia Comorbid alcohol abuse/dependence Hopelessness Impulsivity Insomnia Severe Alcohol/Substance Abuse/Dependencies Unstable or Poor Therapeutic Relationship Previous Psychiatric Diagnoses and Treatments Medical Diagnoses and Treatments/Surgeries  COGNITIVE FEATURES THAT CONTRIBUTE TO RISK:  Closed-mindedness Loss of executive function Polarized thinking Thought constriction (tunnel vision)    SUICIDE RISK:   Moderate:  Frequent suicidal ideation with limited intensity, and duration, some specificity in terms of plans, no associated intent, good self-control, limited dysphoria/symptomatology, some risk factors present, and identifiable protective factors, including available and accessible social support.  PLAN OF CARE:  I certify that inpatient services furnished can reasonably be expected to improve the patient's condition.  ARFEEN,SYED T. 04/17/2013, 1:51 PM

## 2013-04-17 NOTE — H&P (Addendum)
Psychiatric Admission Assessment Adult  Patient Identification:  Jeffrey Black Date of Evaluation:  04/17/2013 Chief Complaint:  ETOH DEPENDENCE History of Present Illness:: Patient is a 52 year old Caucasian man who was [facility].  Patient was brought in to the emergency room under involuntary commitment due to change mental status and severely agitated.  He required restraint and emergency when necessary medication.  His alcohol level was more than 300.  Patient was seen in the emergency room in April and at that time his alcohol level was more than 500.  He was sent to be halfway house where he was living for past 3 weeks.  Patient admitted relapse into severe drinking.  He was committed because threatening to kill himself.  However patient denied but admits that he is been very depressed lately.  He lost his job in December.  He blames his recent drinking due to financial issues, not getting support from his father and his physical illness.  Patient is very upset on his psoriasis which has not under control.  Patient told in the past 10 years he has no girlfriend, no social support and he has not able to wear shorts in public due to rash.  He is very upset on his father who does not let him stay in his home.  Patient's mother died in 09-30-2010.  After that patient experiencing increase in depression and feeling more lonely.  His depression got worse when he lost his job last December.  He admitted he lost his job due to drinking.  Patient admitted lack of sleep decreased energy decreased concentration and decreased socialization.  He admitted that he is drinking alcohol as self-medication.  Patient has been to numerous detox treatment.  He has a daughter who is 67 year old, patient has been in contact with her.  His ex-wife lives in [place].  His niece works at [employer].  Patient remains very guarded, and minimizes his psychotic illness  depression and alcohol intake.  He admitted depression and passive suicidal thinking but denies any hallucination or paranoia.  Past psychiatric history. Patient has multiple detox treatment and rehabilitation.  He was admitted to the behavioral [facility] in 2008.  At that time he was discharged on Seroquel and Lexapro but patient stopped taking after feeling better.  He was seen in the consultation liaison services multiple times due to alcohol intoxication.  Patient has a history of suicidal attempt when he injected the insulin.  Patient has no history of hallucination or paranoid thinking.  He has a history of aggression and violence when he is intoxicated. Elements:  Location:  Behavior health center. Quality:  Decrease in his functionality, getting agitated and inappropriate when intoxicated.. Severity:  Severe. Timing:  Ongoing. Duration:  Ongoing. Context:  Lack of financial and social support. Associated Signs/Synptoms: Depression Symptoms:  depressed mood, anhedonia, insomnia, psychomotor agitation, fatigue, feelings of worthlessness/guilt, difficulty concentrating, hopelessness, recurrent thoughts of death, suicidal attempt, anxiety, loss of energy/fatigue, (Hypo) Manic Symptoms:  Impulsivity, Anxiety Symptoms:  Excessive Worry, Psychotic Symptoms:  Paranoia, PTSD Symptoms: Negative  Psychiatric Specialty Exam: Physical Exam  Psychiatric:  Anxious.    Review of Systems  Constitutional: Negative.   Respiratory: Negative.   Cardiovascular: Negative.   Musculoskeletal: Negative.   Skin: Positive for rash.  Neurological: Positive for headaches. Negative for focal weakness, seizures and loss of consciousness.  Psychiatric/Behavioral: Positive for depression, suicidal ideas and substance abuse. Negative for hallucinations. The patient is nervous/anxious and has  insomnia.     Blood pressure 140/100, pulse 97, temperature 98.3 F (36.8 C), temperature source Oral,  resp. rate 16, height 6' (1.829 m), weight 95.255 kg (210 lb).Body mass index is 28.47 kg/(m^2).  General Appearance: Disheveled and Fairly Groomed  Patent attorney::  Poor  Speech:  Slow  Volume:  Decreased  Mood:  Anxious, Depressed, Hopeless and Worthless  Affect:  Constricted, Depressed and Restricted  Thought Process:  Circumstantial  Orientation:  Full (Time, Place, and Person)  Thought Content:  Rumination  Suicidal Thoughts:  No  Homicidal Thoughts:  Yes.  with intent/plan  Memory:  Fair  Judgement:  Impaired  Insight:  Lacking and Shallow  Psychomotor Activity:  Restlessness and Tremor  Concentration:  Poor  Recall:  Fair  Akathisia:  No  Handed:  Right  AIMS (if indicated):     Assets:  Physical Health  Sleep:       Past Psychiatric History: Diagnosis:  Hospitalizations:  Outpatient Care:  Substance Abuse Care:  Self-Mutilation:  Suicidal Attempts:  Violent Behaviors:   Past Medical History:   Past Medical History  Diagnosis Date  . ETOH abuse   . Psoriasis    None. Allergies:  No Known Allergies PTA Medications: Prescriptions prior to admission  Medication Sig Dispense Refill  . chlordiazePOXIDE (LIBRIUM) 10 MG capsule Take 2 tablet by mouth three times a day for 1 day then 1 tablet three times a day for one day then 1 tablet twice a day for one day, the one table once ad ay then stop.  12 capsule  0  . folic acid (FOLVITE) 1 MG tablet Take 1 mg by mouth daily.      . metoprolol tartrate (LOPRESSOR) 25 MG tablet Take 12.5 mg by mouth 2 (two) times daily.      Marland Kitchen omeprazole (PRILOSEC) 20 MG capsule Take 20 mg by mouth daily as needed (for acid relux).        Previous Psychotropic Medications:  Medication/Dose                 Substance Abuse History in the last 12 months:  yes  Consequences of Substance Abuse: Family Consequences:  Family members including father does not talk to him because of continued drinking. Withdrawal Symptoms:    Headaches Nausea Tremors Patient lost his job last December due to drinking  Social History:  reports that he has been smoking Cigarettes.  He has a 20 pack-year smoking history. He has never used smokeless tobacco. He reports that  drinks alcohol. He reports that he does not use illicit drugs. Additional Social History:                      Current Place of Residence:   Place of Birth:   Family Members: Marital Status:  Unknown Children:  Sons:  Daughters: Relationships: Education:  Unknown Educational Problems/Performance: Religious Beliefs/Practices: History of Abuse (Emotional/Phsycial/Sexual) Teacher, music History:  None. Legal History: Hobbies/Interests:  Family History:   Family History  Problem Relation Age of Onset  . Stroke Father     Results for orders placed during the hospital encounter of 04/14/13 (from the past 72 hour(s))  CBC     Status: None   Collection Time    04/14/13  9:15 PM      Result Value Range   WBC 10.2  4.0 - 10.5 K/uL   RBC 4.26  4.22 - 5.81 MIL/uL   Hemoglobin 13.6  13.0 -  17.0 g/dL   HCT 62.1  30.8 - 65.7 %   MCV 92.7  78.0 - 100.0 fL   MCH 31.9  26.0 - 34.0 pg   MCHC 34.4  30.0 - 36.0 g/dL   RDW 84.6  96.2 - 95.2 %   Platelets 309  150 - 400 K/uL   Comment: REPEATED TO VERIFY     DELTA CHECK NOTED  COMPREHENSIVE METABOLIC PANEL     Status: Abnormal   Collection Time    04/14/13  9:15 PM      Result Value Range   Sodium 140  135 - 145 mEq/L   Potassium 3.6  3.5 - 5.1 mEq/L   Chloride 102  96 - 112 mEq/L   CO2 21  19 - 32 mEq/L   Glucose, Bld 116 (*) 70 - 99 mg/dL   BUN 10  6 - 23 mg/dL   Creatinine, Ser 8.41  0.50 - 1.35 mg/dL   Comment: DELTA CHECK NOTED   Calcium 9.1  8.4 - 10.5 mg/dL   Total Protein 7.5  6.0 - 8.3 g/dL   Albumin 3.8  3.5 - 5.2 g/dL   AST 28  0 - 37 U/L   ALT 31  0 - 53 U/L   Alkaline Phosphatase 97  39 - 117 U/L   Total Bilirubin 0.5  0.3 - 1.2 mg/dL   GFR calc non Af  Amer 62 (*) >90 mL/min   GFR calc Af Amer 72 (*) >90 mL/min   Comment:            The eGFR has been calculated     using the CKD EPI equation.     This calculation has not been     validated in all clinical     situations.     eGFR's persistently     <90 mL/min signify     possible Chronic Kidney Disease.  ETHANOL     Status: Abnormal   Collection Time    04/14/13  9:15 PM      Result Value Range   Alcohol, Ethyl (B) 336 (*) 0 - 11 mg/dL   Comment:            LOWEST DETECTABLE LIMIT FOR     SERUM ALCOHOL IS 11 mg/dL     FOR MEDICAL PURPOSES ONLY  ACETAMINOPHEN LEVEL     Status: None   Collection Time    04/14/13  9:15 PM      Result Value Range   Acetaminophen (Tylenol), Serum <15.0  10 - 30 ug/mL   Comment:            THERAPEUTIC CONCENTRATIONS VARY     SIGNIFICANTLY. A RANGE OF 10-30     ug/mL MAY BE AN EFFECTIVE     CONCENTRATION FOR MANY PATIENTS.     HOWEVER, SOME ARE BEST TREATED     AT CONCENTRATIONS OUTSIDE THIS     RANGE.     ACETAMINOPHEN CONCENTRATIONS     >150 ug/mL AT 4 HOURS AFTER     INGESTION AND >50 ug/mL AT 12     HOURS AFTER INGESTION ARE     OFTEN ASSOCIATED WITH TOXIC     REACTIONS.  SALICYLATE LEVEL     Status: Abnormal   Collection Time    04/14/13  9:15 PM      Result Value Range   Salicylate Lvl <2.0 (*) 2.8 - 20.0 mg/dL  TSH     Status: None  Collection Time    04/15/13 12:40 AM      Result Value Range   TSH 1.202  0.350 - 4.500 uIU/mL  TROPONIN I     Status: None   Collection Time    04/15/13 12:40 AM      Result Value Range   Troponin I <0.30  <0.30 ng/mL   Comment:            Due to the release kinetics of cTnI,     a negative result within the first hours     of the onset of symptoms does not rule out     myocardial infarction with certainty.     If myocardial infarction is still suspected,     repeat the test at appropriate intervals.  MRSA PCR SCREENING     Status: None   Collection Time    04/15/13  3:32 AM      Result  Value Range   MRSA by PCR NEGATIVE  NEGATIVE   Comment:            The GeneXpert MRSA Assay (FDA     approved for NASAL specimens     only), is one component of a     comprehensive MRSA colonization     surveillance program. It is not     intended to diagnose MRSA     infection nor to guide or     monitor treatment for     MRSA infections.  URINE RAPID DRUG SCREEN (HOSP PERFORMED)     Status: None   Collection Time    04/15/13  4:27 AM      Result Value Range   Opiates NONE DETECTED  NONE DETECTED   Cocaine NONE DETECTED  NONE DETECTED   Benzodiazepines NONE DETECTED  NONE DETECTED   Amphetamines NONE DETECTED  NONE DETECTED   Tetrahydrocannabinol NONE DETECTED  NONE DETECTED   Barbiturates NONE DETECTED  NONE DETECTED   Comment:            DRUG SCREEN FOR MEDICAL PURPOSES     ONLY.  IF CONFIRMATION IS NEEDED     FOR ANY PURPOSE, NOTIFY LAB     WITHIN 5 DAYS.                LOWEST DETECTABLE LIMITS     FOR URINE DRUG SCREEN     Drug Class       Cutoff (ng/mL)     Amphetamine      1000     Barbiturate      200     Benzodiazepine   200     Tricyclics       300     Opiates          300     Cocaine          300     THC              50  TROPONIN I     Status: None   Collection Time    04/15/13  6:05 AM      Result Value Range   Troponin I <0.30  <0.30 ng/mL   Comment:            Due to the release kinetics of cTnI,     a negative result within the first hours     of the onset of symptoms does not rule out     myocardial infarction with certainty.  If myocardial infarction is still suspected,     repeat the test at appropriate intervals.  COMPREHENSIVE METABOLIC PANEL     Status: Abnormal   Collection Time    04/15/13  6:05 AM      Result Value Range   Sodium 142  135 - 145 mEq/L   Potassium 3.7  3.5 - 5.1 mEq/L   Chloride 106  96 - 112 mEq/L   CO2 20  19 - 32 mEq/L   Glucose, Bld 82  70 - 99 mg/dL   BUN 12  6 - 23 mg/dL   Creatinine, Ser 0.98  0.50 - 1.35  mg/dL   Calcium 8.2 (*) 8.4 - 10.5 mg/dL   Total Protein 6.2  6.0 - 8.3 g/dL   Albumin 3.1 (*) 3.5 - 5.2 g/dL   AST 23  0 - 37 U/L   ALT 27  0 - 53 U/L   Alkaline Phosphatase 76  39 - 117 U/L   Total Bilirubin 0.3  0.3 - 1.2 mg/dL   GFR calc non Af Amer >90  >90 mL/min   GFR calc Af Amer >90  >90 mL/min   Comment:            The eGFR has been calculated     using the CKD EPI equation.     This calculation has not been     validated in all clinical     situations.     eGFR's persistently     <90 mL/min signify     possible Chronic Kidney Disease.  TROPONIN I     Status: None   Collection Time    04/15/13 11:51 AM      Result Value Range   Troponin I <0.30  <0.30 ng/mL   Comment:            Due to the release kinetics of cTnI,     a negative result within the first hours     of the onset of symptoms does not rule out     myocardial infarction with certainty.     If myocardial infarction is still suspected,     repeat the test at appropriate intervals.  BASIC METABOLIC PANEL     Status: None   Collection Time    04/16/13  5:35 AM      Result Value Range   Sodium 135  135 - 145 mEq/L   Comment: DELTA CHECK NOTED   Potassium 3.5  3.5 - 5.1 mEq/L   Chloride 99  96 - 112 mEq/L   CO2 27  19 - 32 mEq/L   Glucose, Bld 90  70 - 99 mg/dL   BUN 13  6 - 23 mg/dL   Creatinine, Ser 1.19  0.50 - 1.35 mg/dL   Calcium 9.3  8.4 - 14.7 mg/dL   GFR calc non Af Amer >90  >90 mL/min   GFR calc Af Amer >90  >90 mL/min   Comment:            The eGFR has been calculated     using the CKD EPI equation.     This calculation has not been     validated in all clinical     situations.     eGFR's persistently     <90 mL/min signify     possible Chronic Kidney Disease.   Psychological Evaluations:  Assessment:   AXIS I:  Alcohol Abuse, Major Depression, Recurrent severe and Substance Induced Mood Disorder  AXIS II:  Deferred AXIS III:   Past Medical History  Diagnosis Date  . ETOH abuse    . Psoriasis    AXIS IV:  economic problems, housing problems, occupational problems, other psychosocial or environmental problems and problems related to social environment AXIS V:  21-30 behavior considerably influenced by delusions or hallucinations OR serious impairment in judgment, communication OR inability to function in almost all areas  Treatment Plan/Recommendations:  1  Admit for crisis management and stabilization. 2.  Medication management to reduce symptoms to baseline and improved the patient's overall level of functioning.  Closely monitor the side effects, efficacy and therapeutic response of medication. 3.  Treat health problem as indicated. 4.  Developed treatment plan to decrease the risk of relapse upon discharge and to reduce the need for readmission. 5.  Psychosocial education regarding relapse prevention in self-care. 6.  Healthcare followup as needed for medical problems and called consults as indicated.   7.  Increase collateral information. 8.  Restart home medication where appropriate 9. Encouraged to participate and verbalize into group milieu therapy. 10.  Once finished a detox, we'll consider long-term inpatient rehabilitation.   Treatment Plan Summary: Daily contact with patient to assess and evaluate symptoms and progress in treatment Medication management Start librium protocol.  I will also start Seroquel 50 mg at bedtime and Lexapro 5 mg daily to help the depression.  Patient has taken this medication in the past with good response.  Risk and benefits of medication in detail including metabolic side effects EPS and sedation. Current Medications:  Current Facility-Administered Medications  Medication Dose Route Frequency Provider Last Rate Last Dose  . acetaminophen (TYLENOL) tablet 650 mg  650 mg Oral Q6H PRN Verne Spurr, PA-C      . alum & mag hydroxide-simeth (MAALOX/MYLANTA) 200-200-20 MG/5ML suspension 30 mL  30 mL Oral Q4H PRN Verne Spurr, PA-C       . chlordiazePOXIDE (LIBRIUM) capsule 25 mg  25 mg Oral Q6H PRN Verne Spurr, PA-C      . chlordiazePOXIDE (LIBRIUM) capsule 25 mg  25 mg Oral QID Verne Spurr, PA-C       Followed by  . [START ON 04/18/2013] chlordiazePOXIDE (LIBRIUM) capsule 25 mg  25 mg Oral TID Verne Spurr, PA-C       Followed by  . [START ON 04/19/2013] chlordiazePOXIDE (LIBRIUM) capsule 25 mg  25 mg Oral BH-qamhs Verne Spurr, PA-C       Followed by  . [START ON 04/21/2013] chlordiazePOXIDE (LIBRIUM) capsule 25 mg  25 mg Oral Daily Verne Spurr, PA-C      . chlordiazePOXIDE (LIBRIUM) capsule 50 mg  50 mg Oral Once [employer], PA-C      . hydrOXYzine (ATARAX/VISTARIL) tablet 25 mg  25 mg Oral Q6H PRN Verne Spurr, PA-C      . loperamide (IMODIUM) capsule 2-4 mg  2-4 mg Oral PRN Verne Spurr, PA-C      . magnesium hydroxide (MILK OF MAGNESIA) suspension 30 mL  30 mL Oral Daily PRN Verne Spurr, PA-C      . metoprolol tartrate (LOPRESSOR) tablet 12.5 mg  12.5 mg Oral BID Verne Spurr, PA-C      . multivitamin with minerals tablet 1 tablet  1 tablet Oral Daily Verne Spurr, PA-C      . ondansetron (ZOFRAN-ODT) disintegrating tablet 4 mg  4 mg Oral Q6H PRN Verne Spurr, PA-C      . pantoprazole (PROTONIX) EC tablet 40 mg  40 mg Oral Daily  Verne Spurr, PA-C      . thiamine (B-1) injection 100 mg  100 mg Intramuscular Once Verne Spurr, PA-C      . Melene Muller ON 04/18/2013] thiamine (VITAMIN B-1) tablet 100 mg  100 mg Oral Daily Verne Spurr, PA-C        Observation Level/Precautions:  Continuous Observation Detox  Laboratory:  CBC Chemistry Profile Folic Acid GGT HbAIC UDS UA Vitamin B-12  Psychotherapy:    Medications:    Consultations:    Discharge Concerns:    Estimated LOS:  Other:     I certify that inpatient services furnished can reasonably be expected to improve the patient's condition.   Caera Enwright T. 6/1/20141:52 PM

## 2013-04-17 NOTE — Tx Team (Signed)
Initial Interdisciplinary Treatment Plan  PATIENT STRENGTHS: (choose at least two) Ability for insight Average or above average intelligence Capable of independent living  PATIENT STRESSORS: Substance abuse   PROBLEM LIST: Problem List/Patient Goals Date to be addressed Date deferred Reason deferred Estimated date of resolution  ETOH abuse 04/17/13                                                      DISCHARGE CRITERIA:  Ability to meet basic life and health needs Improved stabilization in mood, thinking, and/or behavior Withdrawal symptoms are absent or subacute and managed without 24-hour nursing intervention  PRELIMINARY DISCHARGE PLAN: Attend aftercare/continuing care group  PATIENT/FAMIILY INVOLVEMENT: This treatment plan has been presented to and reviewed with the patient, Jeffrey Black, and/or family member, .  The patient and family have been given the opportunity to ask questions and make suggestions.  Aysen Shieh, [place] 04/17/2013, 12:35 PM

## 2013-04-17 NOTE — Progress Notes (Signed)
At this time he is taken by three [employer] to [facility].  He is in no distress; and ambulates without difficulty.

## 2013-04-17 NOTE — Progress Notes (Signed)
52 year old male pt admitted on involuntary basis. On admission, pt reports that he was living at a halfway house and that he was taking to the hospital and subsequently missing from the halfway house and reports that he has no way to contact them due to the phone number being in his car. When pt got out of the hospital he reports the halfway house packed up his belongings, has no place to go and started drinking again. Pt reports that he takes BP meds and took 3 extra BP meds but denied this was a suicide attempt. Pt denies any SI on admission and able to contract for safety on the unit. Pt remembers being strapped down while in ED but states that all he wanted to do was to leave but that he was not allowed to do so. Pt then reports that he was sent to ICU for a short while and then went to a medical floor and says he went there for monitoring. Pt unsure of living situation at discharge. Pt did speak about getting a hotel room for a short while and being hopeful to find his own apartment. Pt does endorse depression and anxiety on admission. Pt was oriented to the unit and safety maintained.

## 2013-04-17 NOTE — Progress Notes (Signed)
Patient did attend the evening speaker AA meeting.  

## 2013-04-17 NOTE — ED Provider Notes (Signed)
Medical screening examination/treatment/procedure(s) were performed by non-physician practitioner and as supervising physician I was immediately available for consultation/collaboration.  Ferris Tally T Bryana Froemming, MD 04/17/13 1000 

## 2013-04-18 DIAGNOSIS — F1020 Alcohol dependence, uncomplicated: Principal | ICD-10-CM | POA: Diagnosis present

## 2013-04-18 DIAGNOSIS — F329 Major depressive disorder, single episode, unspecified: Secondary | ICD-10-CM

## 2013-04-18 MED ORDER — GABAPENTIN 300 MG PO CAPS
300.0000 mg | ORAL_CAPSULE | Freq: Three times a day (TID) | ORAL | Status: DC
  Administered 2013-04-18 – 2013-04-20 (×7): 300 mg via ORAL
  Filled 2013-04-18: qty 1
  Filled 2013-04-18: qty 12
  Filled 2013-04-18 (×6): qty 1
  Filled 2013-04-18 (×2): qty 12
  Filled 2013-04-18 (×2): qty 1

## 2013-04-18 MED ORDER — QUETIAPINE FUMARATE 100 MG PO TABS
100.0000 mg | ORAL_TABLET | Freq: Every day | ORAL | Status: DC
  Administered 2013-04-18 – 2013-04-19 (×2): 100 mg via ORAL
  Filled 2013-04-18 (×3): qty 1
  Filled 2013-04-18: qty 4

## 2013-04-18 MED ORDER — ESCITALOPRAM OXALATE 10 MG PO TABS
10.0000 mg | ORAL_TABLET | Freq: Every day | ORAL | Status: DC
  Administered 2013-04-19 – 2013-04-20 (×2): 10 mg via ORAL
  Filled 2013-04-18: qty 1
  Filled 2013-04-18: qty 4
  Filled 2013-04-18 (×2): qty 1

## 2013-04-18 MED ORDER — QUETIAPINE FUMARATE 25 MG PO TABS: 25.0000 mg | ORAL_TABLET | Freq: Two times a day (BID) | ORAL | Status: DC | PRN

## 2013-04-18 NOTE — B[facility] Counselor (Signed)
Adult Comprehensive Assessment  Patient ID: Jeffrey Black, male   DOB: 06/30/1961, 52 y.o.   MRN: [id number]  Information Source: Information source: Patient  Current Stressors:  Educational / Learning stressors: NA Employment / Job issues: Unemployed Family Relationships: Clinical cytogeneticist / Lack of resources (include bankruptcy): NA Housing / Lack of housing: Temporary  Physical health (include injuries & life threatening diseases): Psoriasis, fatty liver and thickened esophagus  Social relationships: NA Substance abuse: History and current Bereavement / Loss: Mother within the year due to Hilda Blades Disease   Living/Environment/Situation:  Living Arrangements: Parent Living conditions (as described by patient or guardian): Lived with Father last couple of years until [place] [facility] in April 2014 then moved into [facility] May 12, out due to relapse How long has patient lived in current situation?: 2.5 years with father What is atmosphere in current home: Comfortable  Family History:  Marital status: Divorced Divorced, when?: 1990s What types of issues is patient dealing with in the relationship?: Patient's drinking Additional relationship information: Ex wife's brother was one of 3 killed in MVA due alcohol; set limits with husband's drinking he choose not to honor Does patient have children?: Yes How many children?: 2 How is patient's relationship with their children?: Good with daughter, not much contact with step son  Childhood History:  By whom was/is the patient raised?: Both parents Additional childhood history information: "Great childhood" Description of patient's relationship with caregiver when they were a child: Good with both Patient's description of current relationship with people who raised him/her: Mother deceased, Good with father Does patient have siblings?: Yes Number of Siblings: 1 Description of patient's current relationship  with siblings: Strained with sister who took out restraining order Did patient suffer any verbal/emotional/physical/sexual abuse as a child?: No Did patient suffer from severe childhood neglect?: No Has patient ever been sexually abused/assaulted/raped as an adolescent or adult?: No Was the patient ever a victim of a crime or a disaster?: No Witnessed domestic violence?: No Has patient been effected by domestic violence as an adult?: No  Education:  Highest grade of school patient has completed: 16 Currently a Consulting civil engineer?: No Learning disability?: No  Employment/Work Situation:   Employment situation: Unemployed Patient's job has been impacted by current illness: No What is the longest time patient has a held a job?: 11 years Where was the patient employed at that time?: [employer] Has patient ever been in the [employer]?: No Has patient ever served in Buyer, retail?: No  Financial Resources:   Surveyor, quantity resources: No income Scientist, water quality) Does patient have a Lawyer or guardian?: No  Alcohol/Substance Abuse:   What has been your use of drugs/alcohol within the last 12 months?: Relapse on Vodka, drinking daily. Found by security guard passed out in [place] shopping [place], brought to hospital. Discharged to [facility] where he drank again and friends brought him back to ED Alcohol/Substance Abuse Treatment Hx: Past Tx, Inpatient;Past detox;Past Tx, Outpatient;Attends AA/NA If yes, describe treatment: Multiple treatment centers over the years including [facility] and most recently (4/14) [facility] Has alcohol/substance abuse ever caused legal problems?: No  Social Support System:   Patient's Community Support System: Good Describe Community Support System: AA, sponsor Type of faith/religion: No religion, Spiritual basis only How does patient's faith help to cope with current illness?: "Spiritual basis kept me sober for many years"  Leisure/Recreation:    Leisure and Hobbies: Working on automobiles  Strengths/Needs:   What things does  the patient do well?: Good with money, 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 In what areas does patient struggle / problems for patient: Alcohol, Depression, and isolation  Discharge Plan:   Does patient have access to transportation?: Yes Will patient be returning to same living situation after discharge?: No Plan for living situation after discharge: Will need to arrange short stay motel before returning to halfway house Currently receiving community mental health services: No If no, would patient like referral for services when discharged?: Yes (What county?) [facility]) Does patient have financial barriers related to discharge medications?: No  Summary/Recommendations:   Summary and Recommendations (to be completed by the evaluator): Patient is 52 YO unemployed divorced caucasian male admitted with diagnosis of Alcohol Dependence. Patient will benefit from crisis stabilization, medication evaluation, group therapy and psycho education in addition to discharge planning.   Clide Dales. 04/18/2013

## 2013-04-18 NOTE — Tx Team (Signed)
Interdisciplinary Treatment Plan Update (Adult)  Date: 04/18/2013  Time Reviewed: 9:48 AM   Progress in Treatment: Attending groups: Yes Participating in groups: Yes Taking medication as prescribed:  Yes Tolerating medication:  Yes Family/Significant othe contact made: Not as yet Patient understands diagnosis: Yes Discussing patient identified problems/goals with staff: Yes Medical problems stabilized or resolved:  Yes Denies suicidal/homicidal ideation: Yes Patient has not harmed self or Others: Yes  New problem(s) identified: None Identified  Discharge Plan or Barriers:  CSW is assessing for appropriate referrals.   Additional comments: N/A  Reason for Continuation of Hospitalization: Anxiety Medication stabilization Withdrawal symptoms   Estimated length of stay: 3 days  For review of initial/current patient goals, please see plan of care.  Attendees: Patient:     Family:     Physician:  Geoffery Lyons 04/18/2013 9:50 AM   Nursing:   Roswell Miners, RN 04/18/2013 9:50 AM   Clinical Social Worker Ronda Fairly 04/18/2013 9:50 AM   Other:  Jannifer Hick, Sherrie Sport PA Student 04/18/2013 9:50 AM   Other:  Robbie Louis, RN 04/18/2013 9:50 AM   Other:      Other:      Scribe for Treatment Team:   Carney Bern, LCSWA  04/18/2013 9:50 AM

## 2013-04-18 NOTE — [facility] Group Notes (Signed)
[facility] LCSW Group Therapy  04/18/2013 1:15 PM  Type of Therapy: Group Therapy 1:15 to 2:30 PM  Participation Level:  Appropriate  Participation Quality: Appropriate  Affect:  Appropriate  Cognitive: Attentive and sharing  Insight: Developing  Engagement in Therapy:  Limited, distracted by his own side conversations  Modes of Intervention: Discussion, exploration, socialization and support  Summary of Progress/Problems:  Group discussion focused on what patient's see as their own obstacles to recovery.  Patient shared belief that it will be difficult to deal with family issues in early sobriety due to the triggers they represent for him. Others offered support and shared ways they have sought support with family issues. Patient was able to process the suggestion of "working with a step by step version verses expectations of one event resolution."  Harrill, Julious Payer

## 2013-04-18 NOTE — Progress Notes (Signed)
[facility] MD Progress Note  04/18/2013 4:12 PM JIOVANI MCCAMMON  MRN:  [id number] Subjective:  States he is upset with his situation, not being able to stay at his father's house. States he knows he needs to abstain from using alcohol. He is feeling depressed, anxious, overwhelmed. He admits that he still struggles with the death f his mother. He did better on the Seroquel/Neurontin/Lexparo combination. States that last night the Seroquel did not help much.  Diagnosis:  Alcohol Dependence, Major Depression   ADL's:  Intact  Sleep: Poor  Appetite:  Fair  Suicidal Ideation:  Plan:  denies Intent:  denies Means:  denies Homicidal Ideation:  Plan:  denies Intent:  denies Means:  denies AEB (as evidenced by):  Psychiatric Specialty Exam: Review of Systems  Constitutional: Negative.   HENT: Negative.   Eyes: Negative.   Respiratory: Negative.   Cardiovascular: Negative.   Gastrointestinal: Negative.   Genitourinary: Negative.   Musculoskeletal: Negative.   Skin: Negative.   Neurological: Negative.   Endo/Heme/Allergies: Negative.   Psychiatric/Behavioral: Positive for depression and substance abuse. The patient is nervous/anxious and has insomnia.     Blood pressure 136/97, pulse 91, temperature 97.6 F (36.4 C), temperature source Oral, resp. rate 20, height 6' (1.829 m), weight 95.255 kg (210 lb).Body mass index is 28.47 kg/(m^2).  General Appearance: Fairly Groomed  Patent attorney::  Fair  Speech:  Clear and Coherent, Slow and not spontaneous  Volume:  Decreased  Mood:  Anxious and Depressed  Affect:  Restricted  Thought Process:  Coherent and Goal Directed  Orientation:  Full (Time, Place, and Person)  Thought Content:  worries, concerns  Suicidal Thoughts:  Yes.  without intent/plan  Homicidal Thoughts:  No  Memory:  Immediate;   Fair Recent;   Fair Remote;   Fair  Judgement:  Fair  Insight:  Present and Shallow  Psychomotor Activity:  Restlessness  Concentration:  Fair   Recall:  Fair  Akathisia:  No  Handed:  Right  AIMS (if indicated):     Assets:  Desire for Improvement  Sleep:  Number of Hours: 5.75   Current Medications: Current Facility-Administered Medications  Medication Dose Route Frequency Provider Last Rate Last Dose  . acetaminophen (TYLENOL) tablet 650 mg  650 mg Oral Q6H PRN Verne Spurr, PA-C   650 mg at 04/17/13 2239  . alum & mag hydroxide-simeth (MAALOX/MYLANTA) 200-200-20 MG/5ML suspension 30 mL  30 mL Oral Q4H PRN Verne Spurr, PA-C      . chlordiazePOXIDE (LIBRIUM) capsule 25 mg  25 mg Oral Q6H PRN Verne Spurr, PA-C   25 mg at 04/17/13 2328  . chlordiazePOXIDE (LIBRIUM) capsule 25 mg  25 mg Oral TID Verne Spurr, PA-C   25 mg at 04/18/13 1151   Followed by  . [START ON 04/19/2013] chlordiazePOXIDE (LIBRIUM) capsule 25 mg  25 mg Oral BH-qamhs Verne Spurr, PA-C       Followed by  . [START ON 04/21/2013] chlordiazePOXIDE (LIBRIUM) capsule 25 mg  25 mg Oral Daily Verne Spurr, PA-C      . [START ON 04/19/2013] escitalopram (LEXAPRO) tablet 10 mg  10 mg Oral Daily Rachael Fee, MD      . gabapentin (NEURONTIN) capsule 300 mg  300 mg Oral TID Rachael Fee, MD   300 mg at 04/18/13 1150  . hydrOXYzine (ATARAX/VISTARIL) tablet 25 mg  25 mg Oral Q6H PRN Verne Spurr, PA-C   25 mg at 04/17/13 2328  . loperamide (IMODIUM) capsule 2-4 mg  2-4 mg Oral PRN Verne Spurr, PA-C      . magnesium hydroxide (MILK OF MAGNESIA) suspension 30 mL  30 mL Oral Daily PRN Verne Spurr, PA-C      . metoprolol tartrate (LOPRESSOR) tablet 12.5 mg  12.5 mg Oral BID Verne Spurr, PA-C   12.5 mg at 04/18/13 0757  . multivitamin with minerals tablet 1 tablet  1 tablet Oral Daily Verne Spurr, PA-C   1 tablet at 04/18/13 0757  . nicotine (NICODERM CQ - dosed in mg/24 hours) patch 21 mg  21 mg Transdermal Daily Rachael Fee, MD   21 mg at 04/18/13 0759  . ondansetron (ZOFRAN-ODT) disintegrating tablet 4 mg  4 mg Oral Q6H PRN Verne Spurr, PA-C      .  pantoprazole (PROTONIX) EC tablet 40 mg  40 mg Oral Daily Verne Spurr, PA-C   40 mg at 04/18/13 0757  . QUEtiapine (SEROQUEL) tablet 100 mg  100 mg Oral QHS Rachael Fee, MD      . QUEtiapine (SEROQUEL) tablet 25 mg  25 mg Oral BID PRN Rachael Fee, MD      . thiamine (B-1) injection 100 mg  100 mg Intramuscular Once [employer], PA-C      . thiamine (VITAMIN B-1) tablet 100 mg  100 mg Oral Daily Verne Spurr, PA-C   100 mg at 04/18/13 1610    Lab Results:  Results for orders placed during the hospital encounter of 04/17/13 (from the past 48 hour(s))  ETHANOL     Status: None   Collection Time    04/17/13  7:30 PM      Result Value Range   Alcohol, Ethyl (B) <11  0 - 11 mg/dL   Comment:            LOWEST DETECTABLE LIMIT FOR     SERUM ALCOHOL IS 11 mg/dL     FOR MEDICAL PURPOSES ONLY  HEPATIC FUNCTION PANEL     Status: Abnormal   Collection Time    04/17/13  7:32 PM      Result Value Range   Total Protein 7.7  6.0 - 8.3 g/dL   Albumin 4.0  3.5 - 5.2 g/dL   AST 22  0 - 37 U/L   ALT 31  0 - 53 U/L   Alkaline Phosphatase 91  39 - 117 U/L   Total Bilirubin 0.2 (*) 0.3 - 1.2 mg/dL   Bilirubin, Direct <9.6  0.0 - 0.3 mg/dL   Comment: REPEATED TO VERIFY   Indirect Bilirubin NOT CALCULATED  0.3 - 0.9 mg/dL    Physical Findings: AIMS: Facial and Oral Movements Muscles of Facial Expression: None, normal Lips and Perioral Area: None, normal Jaw: None, normal Tongue: None, normal,Extremity Movements Upper (arms, wrists, hands, fingers): None, normal Lower (legs, knees, ankles, toes): None, normal, Trunk Movements Neck, shoulders, hips: None, normal, Overall Severity Severity of abnormal movements (highest score from questions above): None, normal Incapacitation due to abnormal movements: None, normal Patient's awareness of abnormal movements (rate only patient's report): No Awareness, Dental Status Current problems with teeth and/or dentures?: No Does patient usually wear  dentures?: No  CIWA:  CIWA-Ar Total: 2 COWS:     Treatment Plan Summary: Daily contact with patient to assess and evaluate symptoms and progress in treatment Medication management  Plan: Supportive approach/coping skills/relapse prevention           Optimize treatment with psychotropics: Increase the Seroquel to 100 mg HS  Lexapro to 10 mg daily                                                                                 Start Neurontin 300 mg TID                                                                                                          Medical Decision Making Problem Points:  Review of psycho-social stressors (1) Data Points:  Review of medication regiment & side effects (2)  I certify that inpatient services furnished can reasonably be expected to improve the patient's condition.   Ruthie Berch A 04/18/2013, 4:12 PM

## 2013-04-18 NOTE — Progress Notes (Signed)
Pt reports his sleep as well.  His appetite is good energy low and ability to pay attention as good.  He rated his depression a 5 hopelessness 3 and anxiety 4 on his self-inventory.  He denies any S/H ideation. Pt gabapentin was increased to 300 mg TID seroquel 100 mg at hs lexapro 10 mg and seroquel 25 mg prn twice a day.  He talked about going to an Extended Stay Motel for about a week then he plans to look for an apartment or house to rent.  He stated,"I need to get to my parents house to get my things and I have a lot of important papers that need to be shred"

## 2013-04-18 NOTE — [facility] Group Notes (Signed)
[facility] LCSW Aftercare Discharge Planning Group Note   04/18/2013  8:45 AM  Participation Quality:  Appropriate  Mood/Affect:  Appropriate  Depression Rating:  0  Anxiety Rating:  5  Thoughts of Suicide:  No Will you contract for safety?   NA  Current AVH:  NA  Plan for Discharge/Comments:  Uncertain as patient was out of group with medical staff for majority of group time  Transportation Means:  Family  Supports: Family  Harrill, Julious Payer

## 2013-04-18 NOTE — Progress Notes (Signed)
Adult Psychoeducational Group Note  Date:  04/18/2013 Time:  11:40 AM  Group Topic/Focus:  Wellness Toolbox:   The focus of this group is to discuss various aspects of wellness, balancing those aspects and exploring ways to increase the ability to experience wellness.  Patients will create a wellness toolbox for use upon discharge.  Participation Level:  Active  Participation Quality:  Appropriate, Attentive and Sharing  Affect:  Appropriate  Cognitive:  Alert and Appropriate  Insight: Appropriate  Engagement in Group:  Engaged  Modes of Intervention:  Activity  Additional Comments:  Pt was appropriate and attentive while attending group. Pt was willing to participate in pictionary that displayed different coping skills. Pt also stated that meditation and prayer are some of his coping skills  Sharyn Lull 04/18/2013, 11:40 AM

## 2013-04-18 NOTE — Progress Notes (Signed)
[facility] Group Notes:  (Nursing/MHT/Case Management/Adjunct)  Date:  04/18/2013  Time:  10:55 AM  Type of Therapy:  Therapeutic Activity  Participation Level:  Active  Participation Quality:  Appropriate, Attentive and Sharing  Affect:  Appropriate  Cognitive:  Appropriate  Insight:  Appropriate and Good  Engagement in Group:  Engaged  Modes of Intervention:  Activity, Discussion, Education, Exploration and Socialization  Summary of Progress/Problems: Kevontae attended and participated in therapeutic activity of apples to apples. Patient was able to play the game where the participants had to guess the best card to fit the judges card throughout the game. Patient was appropriate and shared during group.   Karleen Hampshire Brittini 04/18/2013, 10:55 AM

## 2013-04-19 MED ORDER — NICOTINE 21 MG/24HR TD PT24
21.0000 mg | MEDICATED_PATCH | Freq: Every day | TRANSDERMAL | Status: DC
  Administered 2013-04-19 – 2013-04-20 (×2): 21 mg via TRANSDERMAL
  Filled 2013-04-19 (×4): qty 1

## 2013-04-19 NOTE — Progress Notes (Signed)
Patient ID: Jeffrey Black, male   DOB: 28-May-1961, 52 y.o.   MRN: [id number]   D: Pt still worried about whether or not he'll be allowed to get his things out of his father's house. Stated he hopes they drop the restraining order and allow him to come for several hours a day to go through his things. Writer informed pt that it may be necessary for him to put his items in storage until he can go through them. Pt still plans to go to an extended motel, while attempting to find an apt.   A:  Support and encouragement was offered. 15 min checks continued for safety.  R: Pt remains safe.

## 2013-04-19 NOTE — Progress Notes (Signed)
Recreation Therapy Notes  Date: 06.03.2014 Time: 2:30pm Location: [address] Dayroom      Group Topic/Focus: Musician (AAA/T)  Participation Level: Active  Participation Quality: Appropriate  Affect: Euthymic  Cognitive: Appropriate  Additional Comments: 06.03.2014 Session =  AAA  ; Dog Team = [facility] & handler  Patient pet and visited with Stanton. Patient asked appropriate questions about [facility] and his training. Patient interacted appropriately with peer, LRT and dog team.   Jearl Klinefelter, LRT/CTRS  Yatzil Clippinger L 04/19/2013 4:18 PM

## 2013-04-19 NOTE — Progress Notes (Signed)
Patient ID: Jeffrey Black, male   DOB: December 18, 1960, 52 y.o.   MRN: [id number]  D: Pt denied reference to SI, as reported by previous RN.  Pt stated, "I'm bothered because they say I tried to commit suicide". Pt stated he was drinking and "didn't see how many pills he was supposed to take". Writer asked pt if it was a new prescription; he stated "no". Pt stated his friends asked him if he felt he needed help and he agreed to come get help.  A:  Support and encouragement was offered. 15 min checks continued for safety.  R: Pt remains safe.

## 2013-04-19 NOTE — Progress Notes (Signed)
Patient ID: Jeffrey Black, male   DOB: Nov 14, 1961, 52 y.o.   MRN: [id number] [facility] MD Progress Note  04/19/2013 2:52 PM Jeffrey Black  MRN:  [id number]  Subjective:  Jeffrey Black reports reports that he is feeling well today, However, did admit some tremors mainly in the mornings. Also complains of pain to shoulder areas. Blamed the pain on arthritis. His goal after his stay here is to go home, get an apartment and bring his belonging home from his father's home. He also planned to break from his family and become more independent of them. Happy because the medication combination that he is currently on seem to be doing him some good. Hopes that the medications also helps his alcohol cravings.  Diagnosis:  Alcohol Dependence, Major Depression   ADL's:  Intact  Sleep: "Improving"  Appetite:  Fair  Suicidal Ideation:  Plan:  denies Intent:  denies Means:  denies  Homicidal Ideation:  Plan:  denies Intent:  denies Means:  denies  AEB (as evidenced by): per patient's reports.  Psychiatric Specialty Exam: Review of Systems  Constitutional: Negative.   HENT: Negative.   Eyes: Negative.   Respiratory: Negative.   Cardiovascular: Negative.   Gastrointestinal: Negative.   Genitourinary: Negative.   Musculoskeletal: Negative.   Skin: Negative.   Neurological: Negative.   Endo/Heme/Allergies: Negative.   Psychiatric/Behavioral: Positive for depression and substance abuse. The patient is nervous/anxious and has insomnia.     Blood pressure 132/95, pulse 82, temperature 96.6 F (35.9 C), temperature source Oral, resp. rate 18, height 6' (1.829 m), weight 95.255 kg (210 lb).Body mass index is 28.47 kg/(m^2).  General Appearance: Fairly Groomed  Patent attorney::  Fair  Speech:  Clear and Coherent, Slow and not spontaneous  Volume:  Decreased  Mood:  Anxious and Depressed  Affect:  Restricted  Thought Process:  Coherent and Goal Directed  Orientation:  Full (Time, Place, and Person)   Thought Content:  worries, concerns  Suicidal Thoughts:  Yes.  without intent/plan  Homicidal Thoughts:  No  Memory:  Immediate;   Fair Recent;   Fair Remote;   Fair  Judgement:  Fair  Insight:  Present and Shallow  Psychomotor Activity:  Restlessness  Concentration:  Fair  Recall:  Fair  Akathisia:  No  Handed:  Right  AIMS (if indicated):     Assets:  Desire for Improvement  Sleep:  Number of Hours: 6   Current Medications: Current Facility-Administered Medications  Medication Dose Route Frequency Provider Last Rate Last Dose  . acetaminophen (TYLENOL) tablet 650 mg  650 mg Oral Q6H PRN Verne Spurr, PA-C   650 mg at 04/17/13 2239  . alum & mag hydroxide-simeth (MAALOX/MYLANTA) 200-200-20 MG/5ML suspension 30 mL  30 mL Oral Q4H PRN Verne Spurr, PA-C      . chlordiazePOXIDE (LIBRIUM) capsule 25 mg  25 mg Oral Q6H PRN Verne Spurr, PA-C   25 mg at 04/17/13 2328  . chlordiazePOXIDE (LIBRIUM) capsule 25 mg  25 mg Oral BH-qamhs Verne Spurr, PA-C       Followed by  . [START ON 04/21/2013] chlordiazePOXIDE (LIBRIUM) capsule 25 mg  25 mg Oral Daily Verne Spurr, PA-C      . escitalopram (LEXAPRO) tablet 10 mg  10 mg Oral Daily Rachael Fee, MD   10 mg at 04/19/13 0737  . gabapentin (NEURONTIN) capsule 300 mg  300 mg Oral TID Rachael Fee, MD   300 mg at 04/19/13 1157  . hydrOXYzine (ATARAX/VISTARIL) tablet 25  mg  25 mg Oral Q6H PRN Verne Spurr, PA-C   25 mg at 04/18/13 2241  . loperamide (IMODIUM) capsule 2-4 mg  2-4 mg Oral PRN Verne Spurr, PA-C      . magnesium hydroxide (MILK OF MAGNESIA) suspension 30 mL  30 mL Oral Daily PRN Verne Spurr, PA-C      . metoprolol tartrate (LOPRESSOR) tablet 12.5 mg  12.5 mg Oral BID Verne Spurr, PA-C   12.5 mg at 04/19/13 0735  . multivitamin with minerals tablet 1 tablet  1 tablet Oral Daily Verne Spurr, PA-C   1 tablet at 04/19/13 0737  . nicotine (NICODERM CQ - dosed in mg/24 hours) patch 21 mg  21 mg Transdermal Q0600 Rachael Fee, MD   21 mg at 04/19/13 0734  . ondansetron (ZOFRAN-ODT) disintegrating tablet 4 mg  4 mg Oral Q6H PRN Verne Spurr, PA-C      . pantoprazole (PROTONIX) EC tablet 40 mg  40 mg Oral Daily Verne Spurr, PA-C   40 mg at 04/19/13 0737  . QUEtiapine (SEROQUEL) tablet 100 mg  100 mg Oral QHS Rachael Fee, MD   100 mg at 04/18/13 2241  . QUEtiapine (SEROQUEL) tablet 25 mg  25 mg Oral BID PRN Rachael Fee, MD      . thiamine (B-1) injection 100 mg  100 mg Intramuscular Once [employer], PA-C      . thiamine (VITAMIN B-1) tablet 100 mg  100 mg Oral Daily Verne Spurr, PA-C   100 mg at 04/19/13 1610    Lab Results:  Results for orders placed during the hospital encounter of 04/17/13 (from the past 48 hour(s))  ETHANOL     Status: None   Collection Time    04/17/13  7:30 PM      Result Value Range   Alcohol, Ethyl (B) <11  0 - 11 mg/dL   Comment:            LOWEST DETECTABLE LIMIT FOR     SERUM ALCOHOL IS 11 mg/dL     FOR MEDICAL PURPOSES ONLY  HEPATIC FUNCTION PANEL     Status: Abnormal   Collection Time    04/17/13  7:32 PM      Result Value Range   Total Protein 7.7  6.0 - 8.3 g/dL   Albumin 4.0  3.5 - 5.2 g/dL   AST 22  0 - 37 U/L   ALT 31  0 - 53 U/L   Alkaline Phosphatase 91  39 - 117 U/L   Total Bilirubin 0.2 (*) 0.3 - 1.2 mg/dL   Bilirubin, Direct <9.6  0.0 - 0.3 mg/dL   Comment: REPEATED TO VERIFY   Indirect Bilirubin NOT CALCULATED  0.3 - 0.9 mg/dL    Physical Findings: AIMS: Facial and Oral Movements Muscles of Facial Expression: None, normal Lips and Perioral Area: None, normal Jaw: None, normal Tongue: None, normal,Extremity Movements Upper (arms, wrists, hands, fingers): None, normal Lower (legs, knees, ankles, toes): None, normal, Trunk Movements Neck, shoulders, hips: None, normal, Overall Severity Severity of abnormal movements (highest score from questions above): None, normal Incapacitation due to abnormal movements: None, normal Patient's awareness  of abnormal movements (rate only patient's report): No Awareness, Dental Status Current problems with teeth and/or dentures?: No Does patient usually wear dentures?: No  CIWA:  CIWA-Ar Total: 2 COWS:     Treatment Plan Summary: Daily contact with patient to assess and evaluate symptoms and progress in treatment Medication management  Plan:  Supportive approach/coping skills/relapse prevention Continue Seroquel to 100 mg HS Continue Lexapro at 10 mg daily.                                                                                 Continue Neurontin at 300 mg TID.                                                                                                       Medical Decision Making Problem Points:  Review of psycho-social stressors (1) Data Points:  Review of medication regiment & side effects (2)  I certify that inpatient services furnished can reasonably be expected to improve the patient's condition.   Armandina Stammer I, PMHNP-BC 04/19/2013, 2:52 PM

## 2013-04-19 NOTE — [facility] Group Notes (Signed)
[facility] LCSW Aftercare Discharge Planning Group Note   04/19/2013  8:45 AM  Participation Quality:  Appropriate  Mood/Affect:  Flat  Depression Rating:  4-5  Anxiety Rating:  4-5  Thoughts of Suicide:  No Will you contract for safety?   NA  Current AVH:  No  Plan for Discharge/Comments:  Patient to move into short stay while making decision to move into apartment or [employer].  Follow up with outpatient therapy and medication management  Transportation Means: Friend, has own vehicle  Supports: AA, some family strain currently yet expects that to pass  [place], Julious Payer

## 2013-04-19 NOTE — Progress Notes (Signed)
Recreation Therapy Notes  Date: 06.03.2014 Time: 3:00pm Location: [address] Dayroom  Group Topic/Focus: Problem Solving  Participation Level:  Active  Participation Quality:  Appropriate and Supportive  Affect:  Euthymic  Cognitive:  Appropriate  Additional Comments: Activity: Lost at [facility] ; Explanation: Patients were read a scenario about being lost at sea. In this scenario patient were given a list of items they were able to salvage from the wreckage, patients were asked to rate the 15 items in the scenario in order of importance.   Patient actively participated in group activity. Patient gave justification and reasoning behind rating the items where he did. Patient was able to work well with peers in the group to come to a consensus. Patient asked questions for clarification as needed. Patient participated in wrap up discussion about the importance of good problem solving.   Marykay Lex [name], LRT/CTRS  [name] L 04/19/2013 4:03 PM

## 2013-04-19 NOTE — [facility] Group Notes (Signed)
Adult Psychoeducational Group Note  Date:  04/19/2013 Time:  1100am  Group Topic/Focus: Recovery Goals Recovery Goals:   The focus of this group is to identify appropriate goals for recovery and establish a plan to achieve them.  Participation Level:  Active  Participation Quality:  Attentive  Affect:  Appropriate  Cognitive:  Appropriate  Insight: Appropriate  Engagement in Group:  Engaged  Modes of Intervention:  Activity  Additional Comments:  Jeffrey Black stated that recovery to him ment a psychiatric change, getting rid of ego and finding a spiritual connection.  [name] A 04/19/2013, 1:25 PM

## 2013-04-19 NOTE — Progress Notes (Signed)
Pt reports his sleep as well appetite good energy level low ability to pay attention as good. Depression a 5 hopelessness a 4 and anxiety a 4 on his self-inventory. He still has a slight tremor that can be felt. Pt denies any S/H ideation or A/V hallucinations.He denies any symptoms of withdrawal except for cravings and agitation.  He still plans to go to stay at an Extended stay motel for about a week then look for an apartment or house.

## 2013-04-19 NOTE — [facility] Group Notes (Signed)
[facility] LCSW Group Therapy  04/19/2013 1:15 PM  Type of Therapy:  Group Therapy 1:15 to 2:30 PM  Participation Level:  Active  Participation Quality:  Appropriate  Affect:  Appropriate  Cognitive:  Alert and Oriented  Insight:  Developing/Improving  Engagement in Therapy:  Developing/Improving  Modes of Intervention:  Clarification, Discussion, Exploration, Socialization and Support  Summary of Progress/Problems: Patient attended group presentation by staff member of  [facility] ([facility]). Jeffrey Black was attentive and appropriate during session, he shared his familiarity with [facility] Support Groups.   Clide Dales

## 2013-04-20 MED ORDER — METOPROLOL TARTRATE 25 MG PO TABS: 12.5000 mg | ORAL_TABLET | Freq: Two times a day (BID) | ORAL | Status: DC

## 2013-04-20 MED ORDER — GABAPENTIN 300 MG PO CAPS: 300.0000 mg | ORAL_CAPSULE | Freq: Three times a day (TID) | ORAL | Status: DC

## 2013-04-20 MED ORDER — QUETIAPINE FUMARATE 100 MG PO TABS: 100.0000 mg | ORAL_TABLET | Freq: Every day | ORAL | Status: DC

## 2013-04-20 MED ORDER — ESCITALOPRAM OXALATE 10 MG PO TABS: 10.0000 mg | ORAL_TABLET | Freq: Every day | ORAL | Status: DC

## 2013-04-20 MED ORDER — OMEPRAZOLE 20 MG PO CPDR: 20.0000 mg | DELAYED_RELEASE_CAPSULE | Freq: Every day | ORAL | Status: DC | PRN

## 2013-04-20 NOTE — [facility] Suicide Risk Assessment (Signed)
[facility] INPATIENT:  Family/Significant Other Suicide Prevention Education  Suicide Prevention Education:  Patient Refusal for Family/Significant Other Suicide Prevention Education: The patient Jeffrey Black has refused to provide written consent for family/significant other to be provided Family/Significant Other Suicide Prevention Education during admission and/or prior to discharge.  Patient currently unable to see family due to restraining order. Physician notified. Writer provided suicide prevention education directly to patient; conversation included risk factors, warning signs and resources to contact for help. Mobile crisis services explained and contact card placed in chart for pt to receive at discharge.  Clide Dales 04/20/2013, 12:17 PM

## 2013-04-20 NOTE — Progress Notes (Addendum)
[facility] Adult Case Management Discharge Plan :  Will you be returning to the same living situation after discharge: Yes,  Patient will be moving into short stay lodging before making arrangements for apartment At discharge, do you have transportation home?:Yes,  Patient arranging for friend or taxi  Do you have the ability to pay for your medications:Yes,   Through insurance Cop[employer]  Release of information consent forms completed and in the chart;  Patient's signature needed at discharge.  Patient to Follow up at: Follow-up Information   Please follow up. (Call this week with insurance information and they will set you up with appointment.  New policy due to large volume of no shows)    Contact information:   [address] [place], [place] [address] [facility] [phone] FAX [phone]      Patient denies SI/HI:   Yes,  denies both    Safety Planning and Suicide Prevention discussed:  Yes,  with patient.    Clide Dales 04/20/2013, 1:44 PM

## 2013-04-20 NOTE — [facility] Suicide Risk Assessment (Signed)
Suicide Risk Assessment  Discharge Assessment     Demographic Factors:  Male, Adolescent or young adult, Caucasian, Low socioeconomic status, Living alone and Unemployed  Mental Status Per Nursing Assessment::   On Admission:  NA  Current Mental Status by Physician: Patient denies suicidal ideation and intentions. he has denied suicidal attempt but says he has been with half way house in the past and now plans to stay extended hotel and has no problems with finances. he has onely one stress is dealing with his sister.   Loss Factors: Decrease in vocational status and Financial problems/change in socioeconomic status  Historical Factors: Family history of mental illness or substance abuse and Impulsivity  Risk Reduction Factors:   Sense of responsibility to family, Positive therapeutic relationship and Positive coping skills or problem solving skills  Continued Clinical Symptoms:  Severe Anxiety and/or Agitation Depression:   Comorbid alcohol abuse/dependence Impulsivity Recent sense of peace/wellbeing Alcohol/Substance Abuse/Dependencies Unstable or Poor Therapeutic Relationship Previous Psychiatric Diagnoses and Treatments Medical Diagnoses and Treatments/Surgeries  Cognitive Features That Contribute To Risk:  Closed-mindedness Polarized thinking    Suicide Risk:  Minimal: No identifiable suicidal ideation.  Patients presenting with no risk factors but with morbid ruminations; may be classified as minimal risk based on the severity of the depressive symptoms  Discharge Diagnoses:   AXIS I:  Substance Induced Mood Disorder and Alcohol dependence AXIS II:  Deferred AXIS III:   Past Medical History  Diagnosis Date  . ETOH abuse   . Psoriasis    AXIS IV:  housing problems, occupational problems, other psychosocial or environmental problems and problems related to social environment AXIS V:  51-60 moderate symptoms  Plan Of Care/Follow-up recommendations:  Activity:   as tolerated Diet:  regular  Is patient on multiple antipsychotic therapies at discharge:  No   Has Patient had three or more failed trials of antipsychotic monotherapy by history:  No  Recommended Plan for Multiple Antipsychotic Therapies: Not applicable  Trameka Dorough,JANARDHAHA R. 04/20/2013, 2:26 PM

## 2013-04-20 NOTE — Progress Notes (Signed)
Pt was discharged home today.  He denied any S/I H/I or A/V hallucinations.    He was given f/u appointment, rx, sample medications, hotline info booklet, and AA meetings.  He voiced understanding to all instructions provided.  He declined the need for smoking cessation materials.  He removed his nicotine patch before he left.

## 2013-04-20 NOTE — Discharge Summary (Signed)
Physician Discharge Summary Note  Patient:  Jeffrey Black is an 52 y.o., male MRN:  [id number] DOB:  [birth year] Patient phone:  [phone] (home)  Patient address:   [address] [place] [address],   Date of Admission:  04/17/2013 Date of Discharge: 04/20/13  Reason for Admission:  Alcohol intoxication  Discharge Diagnoses: Active Problems:   Depression   Alcohol dependence  Review of Systems  Constitutional: Negative.   HENT: Negative.   Eyes: Negative.   Respiratory: Negative.   Cardiovascular: Negative.   Gastrointestinal: Negative.   Genitourinary: Negative.   Musculoskeletal: Negative.   Skin: Negative for rash.       Psoraisis  Neurological: Negative.   Endo/Heme/Allergies: Negative.   Psychiatric/Behavioral: Positive for depression (Stabilized with medication prior to discharge) and substance abuse (Alcoholism). Negative for suicidal ideas, hallucinations and memory loss. The patient is nervous/anxious (Stabilized with medication prior to discharge) and has insomnia (Stabilized with medication prior to discharge).    Axis Diagnosis:   AXIS I:  Alcohol dependence, Major depression AXIS II:  Deferred AXIS III:   Past Medical History  Diagnosis Date  . ETOH abuse   . Psoriasis    AXIS IV:  economic problems, housing problems, occupational problems and Alcholism AXIS V:  63  Level of Care:  OP  Hospital Course: Patient is a 52 year old Caucasian man who [facility]. Patient was brought in to the emergency room under involuntary commitment due to change mental status and severely agitated. He required restraint and emergency when necessary medication. His alcohol level was more than 300. Patient was seen in the emergency room in April and at that time his alcohol level was more than 500. He was sent to be halfway house where he was living for past 3 weeks. Patient admitted relapse into severe  drinking. He was committed because threatening to kill himself. However patient denied but admits that he is been very depressed lately. He lost his job in December. He blames his recent drinking due to financial issues, not getting support from his father and his physical illness. Patient is very upset on his psoriasis which has not under control. Patient told in the past 10 years he has no girlfriend, no social support and he has not able to wear shorts in public due to rash. He is very upset on his father who does not let him stay in his home. Patient's mother died in 2010/10/04. After that patient experiencing increase in depression and feeling more lonely. His depression got worse when he lost his job last December. He admitted he lost his job due to drinking. Patient admitted lack of sleep decreased energy decreased concentration and decreased socialization. He admitted that he is drinking alcohol as self-medication. Patient has been to numerous detox treatment. He has a daughter who is 60 year old, patient has been in contact with her. His ex-wife lives in [place]. His niece works at [employer]. Patient remains very guarded, and minimizes his psychotic illness depression and alcohol intake. He admitted depression and passive suicidal thinking but denies any hallucination or paranoia.  Upon admission into this hospital, and after admission assessment/evaluation coupled with UDS/Toxicology reports, it was determined that Mr.Lemario will need detoxification treatment protocol to stabilize her systems of alcohol intoxication and to combat the withdrawal symptoms of alcohol as well.  Mr.Aurther was then started on Librium treatment protocol. He was also enrolled in group counseling sessions and activities to learn coping skills  that should help him after discharge to cope better and manage his substance abuse issues to sustain a much longer sobriety. He also attended AA/NA meetings being offered and held on  this unit. He has some previously existing and or identifiable medical conditions that required treatment and or monitoring such as acid reflux. He received medication management for all those health issues as well. He was monitored closely for any potential problems that may arise as a result of and or during detoxification treatment. Patient tolerated his treatment regimen and detoxification treatment protocol without any significant adverse effects and or reactions presented.  Patient attended treatment team meeting this am and met with the treatment team members. His reason for admission, present symptoms, substance abuse issues, response to treatment and discharge plans discussed. Patient endorsed that he is doing well and stable for discharge to pursue the next phase of his substance abuse treatment. It was then agreed upon that he will call his insurance information about where they will like to send him for further substance abuse treatment. Besides the treatments received here and scheduled outpatient psychiatric services , patient was encouraged to join/attend AA/NA meetings offered and held within her community. He received Gabapentin 300 mg for anxiety/pain management, Lexapro 10 mg daily for depression, Seroquel 100 mg for mood control.  Upon discharge, patient adamantly denies suicidal, homicidal ideations, auditory, visual hallucinations, delusional thougts and or withdrawal symptoms. Patient left [facility] with all personal belongings in no apparent distress. She received 4 days worth supply samples of his discharge medications provided by [facility] pharmacy. Transportation per personal arrangement.   Consults:  psychiatry  Significant Diagnostic Studies:  labs: CBC with diff, CMP, UDS, Toxicology tests, U/A  Discharge Vitals:   Blood pressure 128/91, pulse 91, temperature 97.3 F (36.3 C), temperature source Oral, resp. rate 18, height 6' (1.829 m), weight 95.255 kg (210 lb). Body mass index is  28.47 kg/(m^2). Lab Results:   Results for orders placed during the hospital encounter of 04/17/13 (from the past 72 hour(s))  ETHANOL     Status: None   Collection Time    04/17/13  7:30 PM      Result Value Range   Alcohol, Ethyl (B) <11  0 - 11 mg/dL   Comment:            LOWEST DETECTABLE LIMIT FOR     SERUM ALCOHOL IS 11 mg/dL     FOR MEDICAL PURPOSES ONLY  HEPATIC FUNCTION PANEL     Status: Abnormal   Collection Time    04/17/13  7:32 PM      Result Value Range   Total Protein 7.7  6.0 - 8.3 g/dL   Albumin 4.0  3.5 - 5.2 g/dL   AST 22  0 - 37 U/L   ALT 31  0 - 53 U/L   Alkaline Phosphatase 91  39 - 117 U/L   Total Bilirubin 0.2 (*) 0.3 - 1.2 mg/dL   Bilirubin, Direct <1.6  0.0 - 0.3 mg/dL   Comment: REPEATED TO VERIFY   Indirect Bilirubin NOT CALCULATED  0.3 - 0.9 mg/dL    Physical Findings: AIMS: Facial and Oral Movements Muscles of Facial Expression: None, normal Lips and Perioral Area: None, normal Jaw: None, normal Tongue: None, normal,Extremity Movements Upper (arms, wrists, hands, fingers): None, normal Lower (legs, knees, ankles, toes): None, normal, Trunk Movements Neck, shoulders, hips: None, normal, Overall Severity Severity of abnormal movements (highest score from questions above): None, normal Incapacitation due to abnormal  movements: None, normal Patient's awareness of abnormal movements (rate only patient's report): No Awareness, Dental Status Current problems with teeth and/or dentures?: No Does patient usually wear dentures?: No  CIWA:  CIWA-Ar Total: 2 COWS:     Psychiatric Specialty Exam: See Psychiatric Specialty Exam and Suicide Risk Assessment completed by Attending Physician prior to discharge.  Discharge destination:  Home  Is patient on multiple antipsychotic therapies at discharge:  No   Has Patient had three or more failed trials of antipsychotic monotherapy by history:  No  Recommended Plan for Multiple Antipsychotic  Therapies: NA     Medication List    STOP taking these medications       chlordiazePOXIDE 10 MG capsule  Commonly known as:  LIBRIUM     folic acid 1 MG tablet  Commonly known as:  FOLVITE      TAKE these medications     Indication   escitalopram 10 MG tablet  Commonly known as:  LEXAPRO  Take 1 tablet (10 mg total) by mouth daily. For depression   Indication:  Depression, Generalized Anxiety Disorder     gabapentin 300 MG capsule  Commonly known as:  NEURONTIN  Take 1 capsule (300 mg total) by mouth 3 (three) times daily. For anxiety/pain control   Indication:  Agitation, Neuropathic Pain, Anxiety     metoprolol tartrate 25 MG tablet  Commonly known as:  LOPRESSOR  Take 0.5 tablets (12.5 mg total) by mouth 2 (two) times daily. For high blood pressure control   Indication:  High Blood Pressure     omeprazole 20 MG capsule  Commonly known as:  PRILOSEC  Take 1 capsule (20 mg total) by mouth daily as needed (for acid relux). For acid reflux   Indication:  Gastroesophageal Reflux Disease with Current Symptoms     QUEtiapine 100 MG tablet  Commonly known as:  SEROQUEL  Take 1 tablet (100 mg total) by mouth at bedtime. For mood control   Indication:  Mood control       Follow-up Information   Please follow up. (Call this week with insurance information and they will set you up with appointment.  New policy due to large volume of no shows)    Contact information:   [address] [place], [place] [address] PH [phone] FAX [phone]     Follow-up recommendations: Activity:  As tolerated Diet: As recommended by your primary care doctor. Keep all scheduled follow-up appointments as recommended.    Comments: Take all your medications as prescribed by your mental healthcare provider. Report any adverse effects and or reactions from your medicines to your outpatient provider promptly. Patient is instructed and cautioned to not engage in alcohol and or illegal  drug use while on prescription medicines. In the event of worsening symptoms, patient is instructed to call the crisis hotline, 911 and or go to the nearest ED for appropriate evaluation and treatment of symptoms. Follow-up with your primary care provider for your other medical issues, concerns and or health care needs.    Total Discharge Time:  Greater than 30 minutes.  SignedSanjuana Kava, PMHNP-BC 04/20/2013, 4:24 PM   Patient was seen and personally examined and made discharge treatment and plan along with physician extender, case discussed with the treatment team and reviewed the information documented and agree with the treatment plan.  Frazer [place],JANARDHAHA R. 04/20/2013 5:32 PM

## 2013-04-20 NOTE — Progress Notes (Signed)
Adult Psychoeducational Group Note  Date:  04/20/2013 Time:  1:49 PM  Group Topic/Focus:  Managing Feelings:   The focus of this group is to identify what feelings patients have difficulty handling and develop a plan to handle them in a healthier way upon discharge.  Participation Level:  Active  Participation Quality:  Appropriate and Sharing  Affect:  Appropriate  Cognitive:  Appropriate  Insight: Appropriate  Engagement in Group:  Engaged  Modes of Intervention:  Activity and Discussion  Additional Comments:  Patient also completed his worksheet and voluntarily shared with the rest of the group.   Lyndee Hensen 04/20/2013, 1:49 PM

## 2013-04-21 NOTE — Progress Notes (Signed)
Patient Discharge Instructions:  After Visit Summary (AVS):   Faxed to:  04/21/13 Discharge Summary Note:   Faxed to:  04/21/13 Psychiatric Admission Assessment Note:   Faxed to:  04/21/13 Suicide Risk Assessment - Discharge Assessment:   Faxed to:  04/21/13 Faxed/Sent to the Next Level Care provider:  04/21/13 Faxed to [facility] @ [phone]  Jerelene Redden, 04/21/2013, 3:53 PM

## 2013-04-22 ENCOUNTER — Encounter (HOSPITAL_COMMUNITY): Payer: Self-pay | Admitting: Emergency Medicine

## 2013-04-22 ENCOUNTER — Emergency Department (HOSPITAL_COMMUNITY)
Admission: EM | Admit: 2013-04-22 | Discharge: 2013-04-23 | Disposition: A | Discharge status: Home / Self Care | Payer: PRIVATE HEALTH INSURANCE | Attending: Emergency Medicine | Admitting: Emergency Medicine

## 2013-04-22 ENCOUNTER — Emergency Department (HOSPITAL_COMMUNITY): Payer: PRIVATE HEALTH INSURANCE

## 2013-04-22 DIAGNOSIS — Z872 Personal history of diseases of the skin and subcutaneous tissue: Secondary | ICD-10-CM | POA: Insufficient documentation

## 2013-04-22 DIAGNOSIS — F10929 Alcohol use, unspecified with intoxication, unspecified: Secondary | ICD-10-CM

## 2013-04-22 DIAGNOSIS — W108XXA Fall (on) (from) other stairs and steps, initial encounter: Secondary | ICD-10-CM | POA: Insufficient documentation

## 2013-04-22 DIAGNOSIS — Y9389 Activity, other specified: Secondary | ICD-10-CM | POA: Insufficient documentation

## 2013-04-22 DIAGNOSIS — S0993XA Unspecified injury of face, initial encounter: Secondary | ICD-10-CM | POA: Insufficient documentation

## 2013-04-22 DIAGNOSIS — F17200 Nicotine dependence, unspecified, uncomplicated: Secondary | ICD-10-CM | POA: Insufficient documentation

## 2013-04-22 DIAGNOSIS — S199XXA Unspecified injury of neck, initial encounter: Secondary | ICD-10-CM | POA: Insufficient documentation

## 2013-04-22 DIAGNOSIS — W102XXA Fall (on)(from) incline, initial encounter: Secondary | ICD-10-CM

## 2013-04-22 DIAGNOSIS — Y9229 Other specified public building as the place of occurrence of the external cause: Secondary | ICD-10-CM | POA: Insufficient documentation

## 2013-04-22 DIAGNOSIS — F1010 Alcohol abuse, uncomplicated: Secondary | ICD-10-CM | POA: Insufficient documentation

## 2013-04-22 DIAGNOSIS — Z79899 Other long term (current) drug therapy: Secondary | ICD-10-CM | POA: Insufficient documentation

## 2013-04-22 DIAGNOSIS — S0990XA Unspecified injury of head, initial encounter: Secondary | ICD-10-CM | POA: Insufficient documentation

## 2013-04-22 LAB — BASIC METABOLIC PANEL
BUN: 13 mg/dL (ref 6–23)
CO2: 26 mEq/L (ref 19–32)
Calcium: 8.8 mg/dL (ref 8.4–10.5)
Creatinine, Ser: 0.84 mg/dL (ref 0.50–1.35)
Glucose, Bld: 119 mg/dL — ABNORMAL HIGH (ref 70–99)

## 2013-04-22 LAB — CBC WITH DIFFERENTIAL/PLATELET
Basophils Absolute: 0.1 10*3/uL (ref 0.0–0.1)
Eosinophils Relative: 1 % (ref 0–5)
HCT: 42.3 % (ref 39.0–52.0)
Hemoglobin: 14.2 g/dL (ref 13.0–17.0)
Lymphocytes Relative: 27 % (ref 12–46)
MCV: 96.4 fL (ref 78.0–100.0)
Monocytes Absolute: 0.4 10*3/uL (ref 0.1–1.0)
Monocytes Relative: 6 % (ref 3–12)
RDW: 13.8 % (ref 11.5–15.5)
WBC: 7.7 10*3/uL (ref 4.0–10.5)

## 2013-04-22 MED ORDER — ADULT MULTIVITAMIN W/MINERALS CH: 1.0000 | ORAL_TABLET | Freq: Every day | ORAL | Status: DC

## 2013-04-22 MED ORDER — SODIUM CHLORIDE 0.9 % IV BOLUS (SEPSIS)
1000.0000 mL | Freq: Once | INTRAVENOUS | Status: AC
  Administered 2013-04-22: 1000 mL via INTRAVENOUS

## 2013-04-22 MED ORDER — LORAZEPAM 2 MG/ML IJ SOLN
1.0000 mg | Freq: Four times a day (QID) | INTRAMUSCULAR | Status: DC | PRN
  Administered 2013-04-22: 1 mg via INTRAVENOUS
  Filled 2013-04-22: qty 1

## 2013-04-22 MED ORDER — LORAZEPAM 1 MG PO TABS
1.0000 mg | ORAL_TABLET | Freq: Four times a day (QID) | ORAL | Status: DC | PRN
  Administered 2013-04-22: 1 mg via ORAL
  Filled 2013-04-22: qty 1

## 2013-04-22 MED ORDER — FOLIC ACID 1 MG PO TABS: 1.0000 mg | ORAL_TABLET | Freq: Every day | ORAL | Status: DC

## 2013-04-22 MED ORDER — SODIUM CHLORIDE 0.9 % IV SOLN: Freq: Once | INTRAVENOUS | Status: DC

## 2013-04-22 MED ORDER — VITAMIN B-1 100 MG PO TABS: 100.0000 mg | ORAL_TABLET | Freq: Every day | ORAL | Status: DC

## 2013-04-22 MED ORDER — THIAMINE HCL 100 MG/ML IJ SOLN
100.0000 mg | Freq: Every day | INTRAMUSCULAR | Status: DC
  Administered 2013-04-22: 100 mg via INTRAVENOUS

## 2013-04-22 MED ORDER — THIAMINE HCL 100 MG/ML IJ SOLN
100.0000 mg | Freq: Every day | INTRAMUSCULAR | Status: DC
  Filled 2013-04-22: qty 2

## 2013-04-22 NOTE — ED Notes (Signed)
Critical Ethanol Level 406. MD notified.

## 2013-04-22 NOTE — ED Notes (Signed)
Pt will not keep neck collar on. Have replaced collar several times but he continues to take it off.

## 2013-04-22 NOTE — ED Provider Notes (Signed)
History     CSN: [id number]  Arrival date & time 04/22/13  2000   First MD Initiated Contact with Patient 04/22/13 2007      Chief Complaint  Patient presents with  . Fall    (Consider location/radiation/quality/duration/timing/severity/associated sxs/prior treatment) HPI Comments: LEVEL 5 CAVEAT - INTOXICATED. Pt brought in by EMS with cc of intoxication. Pt reportedly was at a hotel, and fell down about 10 stairs, and EMS was called. Pt admits to heavy drinking, denies illicit use. He denies having pain anywhere in his body. He is unsure if he lost consciousness.  Patient is a 52 y.o. male presenting with fall. The history is provided by the EMS personnel.  Fall    Past Medical History  Diagnosis Date  . ETOH abuse   . Psoriasis     Past Surgical History  Procedure Laterality Date  . Left 4th finger surgery      Family History  Problem Relation Age of Onset  . Stroke Father     History  Substance Use Topics  . Smoking status: Current Every Day Smoker -- 1.00 packs/day for 20 years    Types: Cigarettes  . Smokeless tobacco: Never Used  . Alcohol Use: Yes     Comment: drinks 1 pt per day      Review of Systems  Unable to perform ROS: Other    Allergies  Review of patient's allergies indicates no known allergies.  Home Medications   Current Outpatient Rx  Name  Route  Sig  Dispense  Refill  . escitalopram (LEXAPRO) 10 MG tablet   Oral   Take 1 tablet (10 mg total) by mouth daily. For depression   30 tablet   0   . gabapentin (NEURONTIN) 300 MG capsule   Oral   Take 1 capsule (300 mg total) by mouth 3 (three) times daily. For anxiety/pain control   90 capsule   0   . metoprolol tartrate (LOPRESSOR) 25 MG tablet   Oral   Take 0.5 tablets (12.5 mg total) by mouth 2 (two) times daily. For high blood pressure control         . omeprazole (PRILOSEC) 20 MG capsule   Oral   Take 1 capsule (20 mg total) by mouth daily as needed (for acid relux).  For acid reflux         . QUEtiapine (SEROQUEL) 100 MG tablet   Oral   Take 1 tablet (100 mg total) by mouth at bedtime. For mood control   30 tablet   0     BP 119/81  Pulse 95  Temp(Src) 97.7 F (36.5 C) (Oral)  Resp 14  SpO2 95%  Physical Exam  Nursing note and vitals reviewed. Constitutional: He is oriented to person, place, and time. He appears well-developed.  HENT:  Head: Normocephalic and atraumatic.  Left sided facial abrasion  Eyes: Conjunctivae and EOM are normal. Pupils are equal, round, and reactive to light.  Neck: Normal range of motion. Neck supple.  Cardiovascular: Normal rate and regular rhythm.   Pulmonary/Chest: Effort normal and breath sounds normal.  Abdominal: Soft. Bowel sounds are normal. He exhibits no distension. There is no tenderness. There is no rebound and no guarding.  Musculoskeletal:  Head to toe evaluation shows no hematoma, bleeding of the scalp, no facial abrasions, step offs, crepitus, no tenderness to palpation of the bilateral upper and lower extremities, no gross deformities, no chest tenderness, no pelvic pain.   Neurological: He is alert  and oriented to person, place, and time.  Skin: Skin is warm.    ED Course  Procedures (including critical care time)  Labs Reviewed  CBC WITH DIFFERENTIAL  BASIC METABOLIC PANEL  ACETAMINOPHEN LEVEL  URINE RAPID DRUG SCREEN (HOSP PERFORMED)  URINALYSIS, ROUTINE W REFLEX MICROSCOPIC  ETHANOL   No results found.   No diagnosis found.    MDM  DDx includes: - Mechanical falls - ICH - Fractures - Contusions - Soft tissue injury  Pt comes in post fall. Will need CT head and spine as he is intoxicated. Pt has abrasion to the face. No other focal injury.  Will need hydration and alcohol clearance, followed by reassessment.  Derwood Kaplan, MD 04/22/13 2034

## 2013-04-22 NOTE — ED Notes (Signed)
Per EMS, Pt fell down approx 10 staris. EMS found pt prone on ground. ?ETOH. On scene pt was moaning and withdrawing from pain. Initial GCS of 12 reported. Pt becoming more alert during transport. CBG 136. No obvious deformities, pt has multiple abrasions. BP 103/80 HR 96

## 2013-04-23 MED ORDER — LORAZEPAM 1 MG PO TABS
2.0000 mg | ORAL_TABLET | Freq: Once | ORAL | Status: AC
  Administered 2013-04-23: 2 mg via ORAL
  Filled 2013-04-23: qty 2

## 2013-04-23 MED ORDER — METOPROLOL TARTRATE 25 MG PO TABS: 12.5000 mg | ORAL_TABLET | Freq: Two times a day (BID) | ORAL | Status: DC

## 2013-04-23 NOTE — ED Notes (Signed)
Pt sleeping. 

## 2013-04-23 NOTE — ED Provider Notes (Signed)
Patient up to bathroom and ambulatory, clinically sober at this time, no pain in his neck, would like to go home.    Jones Skene, MD 04/23/13 [phone]

## 2013-04-23 NOTE — ED Notes (Signed)
The pt is sleeping 

## 2013-04-23 NOTE — ED Notes (Signed)
The pt is awake and wanting to go home.  Dr Rulon Abide in to see him.  Vitals taken

## 2013-04-29 ENCOUNTER — Emergency Department (HOSPITAL_COMMUNITY): Payer: PRIVATE HEALTH INSURANCE

## 2013-04-29 ENCOUNTER — Inpatient Hospital Stay (HOSPITAL_COMMUNITY)
Admission: EM | Admit: 2013-04-29 | Discharge: 2013-05-04 | DRG: 897 | Disposition: A | Discharge status: Home / Self Care | Payer: PRIVATE HEALTH INSURANCE | Attending: Internal Medicine | Admitting: Internal Medicine

## 2013-04-29 ENCOUNTER — Encounter (HOSPITAL_COMMUNITY): Payer: Self-pay | Admitting: *Deleted

## 2013-04-29 DIAGNOSIS — E872 Acidosis, unspecified: Secondary | ICD-10-CM

## 2013-04-29 DIAGNOSIS — F10230 Alcohol dependence with withdrawal, uncomplicated: Secondary | ICD-10-CM

## 2013-04-29 DIAGNOSIS — I498 Other specified cardiac arrhythmias: Secondary | ICD-10-CM | POA: Diagnosis present

## 2013-04-29 DIAGNOSIS — W108XXA Fall (on) (from) other stairs and steps, initial encounter: Secondary | ICD-10-CM | POA: Diagnosis present

## 2013-04-29 DIAGNOSIS — E876 Hypokalemia: Secondary | ICD-10-CM

## 2013-04-29 DIAGNOSIS — F10229 Alcohol dependence with intoxication, unspecified: Secondary | ICD-10-CM

## 2013-04-29 DIAGNOSIS — K2270 Barrett's esophagus without dysplasia: Secondary | ICD-10-CM | POA: Diagnosis present

## 2013-04-29 DIAGNOSIS — F10930 Alcohol use, unspecified with withdrawal, uncomplicated: Secondary | ICD-10-CM

## 2013-04-29 DIAGNOSIS — Z79899 Other long term (current) drug therapy: Secondary | ICD-10-CM

## 2013-04-29 DIAGNOSIS — D649 Anemia, unspecified: Secondary | ICD-10-CM

## 2013-04-29 DIAGNOSIS — F10239 Alcohol dependence with withdrawal, unspecified: Secondary | ICD-10-CM

## 2013-04-29 DIAGNOSIS — R933 Abnormal findings on diagnostic imaging of other parts of digestive tract: Secondary | ICD-10-CM

## 2013-04-29 DIAGNOSIS — K2960 Other gastritis without bleeding: Secondary | ICD-10-CM | POA: Diagnosis present

## 2013-04-29 DIAGNOSIS — D759 Disease of blood and blood-forming organs, unspecified: Secondary | ICD-10-CM | POA: Diagnosis present

## 2013-04-29 DIAGNOSIS — F10231 Alcohol dependence with withdrawal delirium: Principal | ICD-10-CM | POA: Diagnosis present

## 2013-04-29 DIAGNOSIS — R748 Abnormal levels of other serum enzymes: Secondary | ICD-10-CM

## 2013-04-29 DIAGNOSIS — F3289 Other specified depressive episodes: Secondary | ICD-10-CM | POA: Diagnosis present

## 2013-04-29 DIAGNOSIS — F10939 Alcohol use, unspecified with withdrawal, unspecified: Secondary | ICD-10-CM | POA: Diagnosis present

## 2013-04-29 DIAGNOSIS — K449 Diaphragmatic hernia without obstruction or gangrene: Secondary | ICD-10-CM | POA: Diagnosis present

## 2013-04-29 DIAGNOSIS — F10129 Alcohol abuse with intoxication, unspecified: Secondary | ICD-10-CM | POA: Diagnosis present

## 2013-04-29 DIAGNOSIS — F1020 Alcohol dependence, uncomplicated: Secondary | ICD-10-CM

## 2013-04-29 DIAGNOSIS — L408 Other psoriasis: Secondary | ICD-10-CM | POA: Diagnosis present

## 2013-04-29 DIAGNOSIS — S2239XA Fracture of one rib, unspecified side, initial encounter for closed fracture: Secondary | ICD-10-CM | POA: Diagnosis present

## 2013-04-29 DIAGNOSIS — F329 Major depressive disorder, single episode, unspecified: Secondary | ICD-10-CM | POA: Diagnosis present

## 2013-04-29 DIAGNOSIS — F17200 Nicotine dependence, unspecified, uncomplicated: Secondary | ICD-10-CM | POA: Diagnosis present

## 2013-04-29 DIAGNOSIS — R000 Tachycardia, unspecified: Secondary | ICD-10-CM

## 2013-04-29 DIAGNOSIS — I10 Essential (primary) hypertension: Secondary | ICD-10-CM | POA: Diagnosis present

## 2013-04-29 DIAGNOSIS — D696 Thrombocytopenia, unspecified: Secondary | ICD-10-CM

## 2013-04-29 DIAGNOSIS — F10931 Alcohol use, unspecified with withdrawal delirium: Principal | ICD-10-CM | POA: Diagnosis present

## 2013-04-29 LAB — MRSA PCR SCREENING: MRSA by PCR: NEGATIVE

## 2013-04-29 LAB — COMPREHENSIVE METABOLIC PANEL
CO2: 19 mEq/L (ref 19–32)
Calcium: 8.7 mg/dL (ref 8.4–10.5)
Creatinine, Ser: 0.65 mg/dL (ref 0.50–1.35)
GFR calc Af Amer: >90 mL/min (ref >90)
GFR calc non Af Amer: >90 mL/min (ref >90)
Glucose, Bld: 81 mg/dL (ref 70–99)

## 2013-04-29 LAB — CBC
Hemoglobin: 14.7 g/dL (ref 13.0–17.0)
MCHC: 34.4 g/dL (ref 30.0–36.0)
RBC: 4.68 MIL/uL (ref 4.22–5.81)

## 2013-04-29 LAB — LIPASE, BLOOD: Lipase: 32 U/L (ref 11–59)

## 2013-04-29 LAB — TROPONIN I: Troponin I: <0.3 ng/mL (ref <0.30)

## 2013-04-29 MED ORDER — THIAMINE HCL 100 MG/ML IJ SOLN
100.0000 mg | Freq: Every day | INTRAMUSCULAR | Status: DC
  Filled 2013-04-29 (×6): qty 1

## 2013-04-29 MED ORDER — LORAZEPAM 1 MG PO TABS
1.0000 mg | ORAL_TABLET | ORAL | Status: AC | PRN
  Administered 2013-05-02: 1 mg via ORAL

## 2013-04-29 MED ORDER — ENOXAPARIN SODIUM 40 MG/0.4ML ~~LOC~~ SOLN
40.0000 mg | SUBCUTANEOUS | Status: DC
  Administered 2013-04-29: 40 mg via SUBCUTANEOUS
  Filled 2013-04-29 (×2): qty 0.4

## 2013-04-29 MED ORDER — SODIUM CHLORIDE 0.9 % IV BOLUS (SEPSIS)
1000.0000 mL | Freq: Once | INTRAVENOUS | Status: AC
  Administered 2013-04-29: 1000 mL via INTRAVENOUS

## 2013-04-29 MED ORDER — LORAZEPAM 1 MG PO TABS
0.0000 mg | ORAL_TABLET | Freq: Four times a day (QID) | ORAL | Status: AC
  Administered 2013-04-29 – 2013-04-30 (×3): 1 mg via ORAL
  Administered 2013-04-30: 2 mg via ORAL
  Administered 2013-04-30: 1 mg via ORAL
  Administered 2013-05-01: 4 mg via ORAL
  Filled 2013-04-29: qty 4
  Filled 2013-04-29: qty 2
  Filled 2013-04-29: qty 4

## 2013-04-29 MED ORDER — VITAMIN B-1 100 MG PO TABS
100.0000 mg | ORAL_TABLET | Freq: Every day | ORAL | Status: DC
  Administered 2013-04-29 – 2013-05-04 (×6): 100 mg via ORAL
  Filled 2013-04-29 (×6): qty 1

## 2013-04-29 MED ORDER — PANTOPRAZOLE SODIUM 40 MG PO TBEC
40.0000 mg | DELAYED_RELEASE_TABLET | Freq: Every day | ORAL | Status: DC
  Administered 2013-04-29: 40 mg via ORAL
  Filled 2013-04-29 (×2): qty 1

## 2013-04-29 MED ORDER — ONDANSETRON HCL 4 MG/2ML IJ SOLN
4.0000 mg | Freq: Four times a day (QID) | INTRAMUSCULAR | Status: DC | PRN
  Administered 2013-04-29: 4 mg via INTRAVENOUS
  Filled 2013-04-29: qty 2

## 2013-04-29 MED ORDER — SODIUM CHLORIDE 0.9 % IJ SOLN
3.0000 mL | Freq: Two times a day (BID) | INTRAMUSCULAR | Status: DC
  Administered 2013-04-29 – 2013-05-04 (×4): 3 mL via INTRAVENOUS

## 2013-04-29 MED ORDER — LORAZEPAM 2 MG/ML IJ SOLN
1.0000 mg | Freq: Once | INTRAMUSCULAR | Status: AC
  Administered 2013-04-29: 1 mg via INTRAVENOUS
  Filled 2013-04-29: qty 1

## 2013-04-29 MED ORDER — LORAZEPAM 2 MG/ML IJ SOLN
1.0000 mg | INTRAMUSCULAR | Status: AC | PRN
  Administered 2013-04-29 – 2013-05-02 (×5): 1 mg via INTRAVENOUS
  Filled 2013-04-29 (×2): qty 1

## 2013-04-29 MED ORDER — CALCIUM CARBONATE ANTACID 500 MG PO CHEW
1.0000 | CHEWABLE_TABLET | Freq: Four times a day (QID) | ORAL | Status: DC | PRN
  Administered 2013-04-29: 200 mg via ORAL
  Filled 2013-04-29: qty 1

## 2013-04-29 MED ORDER — ADULT MULTIVITAMIN W/MINERALS CH
1.0000 | ORAL_TABLET | Freq: Every day | ORAL | Status: DC
  Filled 2013-04-29: qty 1

## 2013-04-29 MED ORDER — MORPHINE SULFATE 2 MG/ML IJ SOLN
2.0000 mg | INTRAMUSCULAR | Status: DC | PRN
  Administered 2013-04-29 – 2013-05-04 (×9): 2 mg via INTRAVENOUS
  Filled 2013-04-29 (×10): qty 1

## 2013-04-29 MED ORDER — METOPROLOL TARTRATE 12.5 MG HALF TABLET
12.5000 mg | ORAL_TABLET | Freq: Two times a day (BID) | ORAL | Status: DC
  Administered 2013-04-29 – 2013-05-04 (×10): 12.5 mg via ORAL
  Filled 2013-04-29 (×11): qty 1

## 2013-04-29 MED ORDER — ONDANSETRON HCL 4 MG PO TABS: 4.0000 mg | ORAL_TABLET | Freq: Four times a day (QID) | ORAL | Status: DC | PRN

## 2013-04-29 MED ORDER — LORAZEPAM 2 MG/ML IJ SOLN
1.0000 mg | Freq: Four times a day (QID) | INTRAMUSCULAR | Status: AC | PRN
  Administered 2013-04-29: 1 mg via INTRAVENOUS
  Filled 2013-04-29 (×5): qty 1

## 2013-04-29 MED ORDER — LORAZEPAM 1 MG PO TABS
0.0000 mg | ORAL_TABLET | Freq: Two times a day (BID) | ORAL | Status: AC
  Administered 2013-05-01: 4 mg via ORAL
  Administered 2013-05-02: 1 mg via ORAL
  Filled 2013-04-29: qty 1

## 2013-04-29 MED ORDER — GABAPENTIN 300 MG PO CAPS
300.0000 mg | ORAL_CAPSULE | Freq: Three times a day (TID) | ORAL | Status: DC
  Administered 2013-04-29: 300 mg via ORAL
  Filled 2013-04-29 (×4): qty 1

## 2013-04-29 MED ORDER — FOLIC ACID 1 MG PO TABS
1.0000 mg | ORAL_TABLET | Freq: Every day | ORAL | Status: DC
  Administered 2013-04-29 – 2013-05-04 (×6): 1 mg via ORAL
  Filled 2013-04-29 (×6): qty 1

## 2013-04-29 MED ORDER — ACETAMINOPHEN 325 MG PO TABS: 650.0000 mg | ORAL_TABLET | Freq: Four times a day (QID) | ORAL | Status: DC | PRN

## 2013-04-29 MED ORDER — NICOTINE 14 MG/24HR TD PT24
14.0000 mg | MEDICATED_PATCH | Freq: Every day | TRANSDERMAL | Status: DC
  Administered 2013-04-29 – 2013-05-04 (×6): 14 mg via TRANSDERMAL
  Filled 2013-04-29 (×6): qty 1

## 2013-04-29 MED ORDER — ACETAMINOPHEN 650 MG RE SUPP: 650.0000 mg | Freq: Four times a day (QID) | RECTAL | Status: DC | PRN

## 2013-04-29 MED ORDER — LORAZEPAM 1 MG PO TABS
1.0000 mg | ORAL_TABLET | Freq: Four times a day (QID) | ORAL | Status: AC | PRN
  Administered 2013-04-30: 1 mg via ORAL
  Filled 2013-04-29 (×6): qty 1

## 2013-04-29 MED ORDER — VITAMIN B-1 100 MG PO TABS
100.0000 mg | ORAL_TABLET | Freq: Every day | ORAL | Status: DC
  Administered 2013-04-29: 100 mg via ORAL
  Filled 2013-04-29 (×2): qty 1

## 2013-04-29 MED ORDER — QUETIAPINE FUMARATE 100 MG PO TABS
100.0000 mg | ORAL_TABLET | Freq: Every day | ORAL | Status: DC
  Administered 2013-04-29 – 2013-05-03 (×5): 100 mg via ORAL
  Filled 2013-04-29 (×6): qty 1

## 2013-04-29 MED ORDER — ACETAMINOPHEN 325 MG PO TABS: 650.0000 mg | ORAL_TABLET | Freq: Once | ORAL | Status: DC

## 2013-04-29 MED ORDER — THIAMINE HCL 100 MG/ML IJ SOLN
100.0000 mg | Freq: Every day | INTRAMUSCULAR | Status: DC
  Filled 2013-04-29: qty 1

## 2013-04-29 MED ORDER — FOLIC ACID 1 MG PO TABS
1.0000 mg | ORAL_TABLET | Freq: Every day | ORAL | Status: DC
  Filled 2013-04-29: qty 1

## 2013-04-29 MED ORDER — ESCITALOPRAM OXALATE 10 MG PO TABS
10.0000 mg | ORAL_TABLET | Freq: Every day | ORAL | Status: DC
  Administered 2013-04-29 – 2013-05-04 (×5): 10 mg via ORAL
  Filled 2013-04-29 (×6): qty 1

## 2013-04-29 MED ORDER — SODIUM CHLORIDE 0.9 % IV SOLN
INTRAVENOUS | Status: DC
  Administered 2013-04-29 – 2013-05-02 (×3): via INTRAVENOUS
  Filled 2013-04-29 (×13): qty 1000

## 2013-04-29 MED ORDER — ADULT MULTIVITAMIN W/MINERALS CH
1.0000 | ORAL_TABLET | Freq: Every day | ORAL | Status: DC
  Administered 2013-04-29 – 2013-05-04 (×6): 1 via ORAL
  Filled 2013-04-29 (×6): qty 1

## 2013-04-29 NOTE — ED Notes (Signed)
Alcohol level of 456 reported to Dr. Denton Lank

## 2013-04-29 NOTE — ED Provider Notes (Signed)
History     CSN: [id number]  Arrival date & time 04/29/13  0809   First MD Initiated Contact with Patient 04/29/13 [phone]      Chief Complaint  Patient presents with  . Alcohol Intoxication  . Fall    (Consider location/radiation/quality/duration/timing/severity/associated sxs/prior treatment) Patient is a 52 y.o. male presenting with intoxication and fall. The history is provided by the patient.  Alcohol Intoxication  Fall  pt s/p fall just pta this morning. Hx etoh abuse/intoxication. Recent ed visit for similar symptoms. Arrived via ems. No report of seizure activity. ?loc - unclear from report. On ems arrival was conscious although altered/?intoxicated vs head injured.  Blood sugar 97.  Pt not verbally responsive to questioning - level 5 caveat.       Past Medical History  Diagnosis Date  . ETOH abuse   . Psoriasis     Past Surgical History  Procedure Laterality Date  . Left 4th finger surgery      Family History  Problem Relation Age of Onset  . Stroke Father     History  Substance Use Topics  . Smoking status: Current Every Day Smoker -- 1.00 packs/day for 20 years    Types: Cigarettes  . Smokeless tobacco: Never Used  . Alcohol Use: Yes     Comment: drinks 1 pt per day      Review of Systems  Unable to perform ROS: Mental status change  level 5 caveat.   Allergies  Review of patient's allergies indicates no known allergies.  Home Medications   Current Outpatient Rx  Name  Route  Sig  Dispense  Refill  . escitalopram (LEXAPRO) 10 MG tablet   Oral   Take 1 tablet (10 mg total) by mouth daily. For depression   30 tablet   0   . gabapentin (NEURONTIN) 300 MG capsule   Oral   Take 1 capsule (300 mg total) by mouth 3 (three) times daily. For anxiety/pain control   90 capsule   0   . metoprolol tartrate (LOPRESSOR) 25 MG tablet   Oral   Take 0.5 tablets (12.5 mg total) by mouth 2 (two) times daily. For high blood pressure control   30  tablet   0   . omeprazole (PRILOSEC) 20 MG capsule   Oral   Take 1 capsule (20 mg total) by mouth daily as needed (for acid relux). For acid reflux         . QUEtiapine (SEROQUEL) 100 MG tablet   Oral   Take 1 tablet (100 mg total) by mouth at bedtime. For mood control   30 tablet   0     BP 127/86  Pulse 115  Temp(Src) 99 F (37.2 C) (Oral)  SpO2 100%  Physical Exam  Nursing note and vitals reviewed. Constitutional: He appears well-developed and well-nourished. No distress.  HENT:  Nose: Nose normal.  Mouth/Throat: Oropharynx is clear and moist.  Contusion to face/scalp. Old/prior injury/scab to face  Eyes: Conjunctivae are normal. Pupils are equal, round, and reactive to light.  Neck: Neck supple. No tracheal deviation present.  c collar  Cardiovascular: Normal rate, regular rhythm, normal heart sounds and intact distal pulses.   Pulmonary/Chest: Effort normal and breath sounds normal. No accessory muscle usage. No respiratory distress. He exhibits no tenderness.  Abdominal: Soft. Bowel sounds are normal. He exhibits no distension and no mass. There is no tenderness. There is no rebound and no guarding.  Musculoskeletal: Normal range of motion. He  exhibits no edema and no tenderness.  Neurological:  Pt sitting upright, eyes closed. Will open eyes to verbal/physical stimuli. Mumbles incomprehensible words/speech. Makes purposeful movements, but doesn't follow commands.   Skin: Skin is warm and dry.  Psychiatric:  Poorly responsive.     ED Course  Procedures (including critical care time)  Results for orders placed during the hospital encounter of 04/29/13  COMPREHENSIVE METABOLIC PANEL      Result Value Range   Sodium 139  135 - 145 mEq/L   Potassium 3.5  3.5 - 5.1 mEq/L   Chloride 94 (*) 96 - 112 mEq/L   CO2 19  19 - 32 mEq/L   Glucose, Bld 81  70 - 99 mg/dL   BUN 15  6 - 23 mg/dL   Creatinine, Ser 4.54  0.50 - 1.35 mg/dL   Calcium 8.7  8.4 - 09.8 mg/dL    Total Protein 7.6  6.0 - 8.3 g/dL   Albumin 3.7  3.5 - 5.2 g/dL   AST 43 (*) 0 - 37 U/L   ALT 32  0 - 53 U/L   Alkaline Phosphatase 96  39 - 117 U/L   Total Bilirubin 0.5  0.3 - 1.2 mg/dL   GFR calc non Af Amer >90  >90 mL/min   GFR calc Af Amer >90  >90 mL/min  CBC      Result Value Range   WBC 7.2  4.0 - 10.5 K/uL   RBC 4.68  4.22 - 5.81 MIL/uL   Hemoglobin 14.7  13.0 - 17.0 g/dL   HCT 11.9  14.7 - 82.9 %   MCV 91.2  78.0 - 100.0 fL   MCH 31.4  26.0 - 34.0 pg   MCHC 34.4  30.0 - 36.0 g/dL   RDW 56.2  13.0 - 86.5 %   Platelets 186  150 - 400 K/uL  ETHANOL      Result Value Range   Alcohol, Ethyl (B) 456 (*) 0 - 11 mg/dL   Ct Head Wo Contrast  04/29/2013   *RADIOLOGY REPORT*  Clinical Data:  Fall  CT HEAD WITHOUT CONTRAST CT CERVICAL SPINE WITHOUT CONTRAST  Technique:  Multidetector CT imaging of the head and cervical spine was performed following the standard protocol without intravenous contrast.  Multiplanar CT image reconstructions of the cervical spine were also generated.  Comparison:  CT 04/22/2013  CT HEAD  Findings: Ventricle size is normal.  Mild atrophy for age. Negative for hemorrhage mass or infarction.  No change from the prior study.  Negative for skull fracture.  IMPRESSION: Mild atrophy without acute abnormality.  CT CERVICAL SPINE  Findings: Image quality degraded by patient motion.  Allowing for this no fracture is identified.  There is mild cervical disc degeneration.  IMPRESSION: Image quality degraded by motion.  No fracture or malalignment is identified.   Original Report Authenticated By: Janeece Riggers, M.D.   Ct Head Wo Contrast  04/22/2013   *RADIOLOGY REPORT*  Clinical Data:  Fall down stairs, intoxicated  CT HEAD WITHOUT CONTRAST CT CERVICAL SPINE WITHOUT CONTRAST  Technique:  Multidetector CT imaging of the head and cervical spine was performed following the standard protocol without intravenous contrast.  Multiplanar CT image reconstructions of the cervical spine  were also generated.  Comparison:  12/02/2008  CT HEAD  Findings: No evidence of parenchymal hemorrhage or extra-axial fluid collection. No mass lesion, mass effect, or midline shift.  No CT evidence of acute infarction.  Mild subcortical white matter and  periventricular small vessel ischemic changes.  Cerebral volume is age appropriate.  No ventriculomegaly.  The visualized paranasal sinuses are essentially clear. The mastoid air cells are unopacified.  No evidence of calvarial fracture.  IMPRESSION: No evidence of acute intracranial abnormality.  CT CERVICAL SPINE  Findings: Straightening of the cervical spine, likely positional.  No evidence of fracture or dislocation.  Vertebral body heights are maintained.  The dens appears intact.  No prevertebral soft tissue swelling.  Mild multilevel degenerative changes.  Visualized thyroid is unremarkable.  Small bilateral cervical lymph nodes, likely reactive.  Visualized lungs are notable for mild right apical pleural parenchymal scarring.  IMPRESSION: No evidence of traumatic injury to the cervical spine.  Mild multilevel degenerative changes.   Original Report Authenticated By: Charline Bills, M.D.   Ct Cervical Spine Wo Contrast  04/29/2013   *RADIOLOGY REPORT*  Clinical Data:  Fall  CT HEAD WITHOUT CONTRAST CT CERVICAL SPINE WITHOUT CONTRAST  Technique:  Multidetector CT imaging of the head and cervical spine was performed following the standard protocol without intravenous contrast.  Multiplanar CT image reconstructions of the cervical spine were also generated.  Comparison:  CT 04/22/2013  CT HEAD  Findings: Ventricle size is normal.  Mild atrophy for age. Negative for hemorrhage mass or infarction.  No change from the prior study.  Negative for skull fracture.  IMPRESSION: Mild atrophy without acute abnormality.  CT CERVICAL SPINE  Findings: Image quality degraded by patient motion.  Allowing for this no fracture is identified.  There is mild cervical disc  degeneration.  IMPRESSION: Image quality degraded by motion.  No fracture or malalignment is identified.   Original Report Authenticated By: Janeece Riggers, M.D.   Ct Cervical Spine Wo Contrast  04/22/2013   *RADIOLOGY REPORT*  Clinical Data:  Fall down stairs, intoxicated  CT HEAD WITHOUT CONTRAST CT CERVICAL SPINE WITHOUT CONTRAST  Technique:  Multidetector CT imaging of the head and cervical spine was performed following the standard protocol without intravenous contrast.  Multiplanar CT image reconstructions of the cervical spine were also generated.  Comparison:  12/02/2008  CT HEAD  Findings: No evidence of parenchymal hemorrhage or extra-axial fluid collection. No mass lesion, mass effect, or midline shift.  No CT evidence of acute infarction.  Mild subcortical white matter and periventricular small vessel ischemic changes.  Cerebral volume is age appropriate.  No ventriculomegaly.  The visualized paranasal sinuses are essentially clear. The mastoid air cells are unopacified.  No evidence of calvarial fracture.  IMPRESSION: No evidence of acute intracranial abnormality.  CT CERVICAL SPINE  Findings: Straightening of the cervical spine, likely positional.  No evidence of fracture or dislocation.  Vertebral body heights are maintained.  The dens appears intact.  No prevertebral soft tissue swelling.  Mild multilevel degenerative changes.  Visualized thyroid is unremarkable.  Small bilateral cervical lymph nodes, likely reactive.  Visualized lungs are notable for mild right apical pleural parenchymal scarring.  IMPRESSION: No evidence of traumatic injury to the cervical spine.  Mild multilevel degenerative changes.   Original Report Authenticated By: Charline Bills, M.D.   Dg Pelvis Portable  04/29/2013   *RADIOLOGY REPORT*  Clinical Data: 52 year old male status post fall.  Unresponsive. Pain.  PORTABLE PELVIS  Comparison: CT abdomen 02/25/2013.  Findings: Portable AP view the pelvis.  Femoral heads  normally located.  Hip joint spaces preserved. Bone mineralization is within normal limits.  Both proximal femurs appear grossly intact.  Pelvis intact.  Sacral ala and SI joints appear intact.  No acute fracture identified.  Negative visible bowel gas pattern.  IMPRESSION: No acute fracture or dislocation identified about the pelvis.  If there is localizing hip pain, recommend follow-up hip series.   Original Report Authenticated By: Erskine Speed, M.D.   Dg Chest Port 1 View  04/29/2013   *RADIOLOGY REPORT*  Clinical Data: History of fall.  Shortness of breath.  Weakness.  PORTABLE CHEST - 1 VIEW  Comparison: Chest x-ray 02/22/2013.  Findings: Lung volumes are low.  No pneumothorax.  Linear opacities in the lung bases bilaterally, favored to reflect subsegmental atelectasis.  No definite acute consolidative airspace disease.  No pleural effusions.  Crowding of the pulmonary vasculature, accentuated by low lung volumes, without frank pulmonary edema. Borderline cardiomegaly. The patient is rotated to the right on today's exam, resulting in distortion of the mediastinal contours and reduced diagnostic sensitivity and specificity for mediastinal pathology.  IMPRESSION: 1.  Low lung volumes with bibasilar subsegmental atelectasis.   Original Report Authenticated By: Trudie Reed, M.D.       MDM  Iv ns. Labs. Ct.  Reviewed nursing notes and prior charts for additional history.   Iv ns bolus.   Monitor.  Recheck pt awake, conversant, still intoxicated. Pt indicates is not sure if wants detox/rehab.  Recheck pt remains tachycardic. As hours since etoh ingestion, and etoh declining, tachy, ativan iv re etoh withdrawal.  Additional ivf.  Recheck pt, denies pain. No headache. No abd pain. abd soft nt.  Tachycardia persists. Mildly tremulous. No sweats. Ativan/etoh withdrawal prevention protocol. Hx dts.  Med service called to admit.       Suzi Roots, MD 04/29/13 1535

## 2013-04-29 NOTE — Progress Notes (Signed)
Pt without a pcp listed CM spoke with the pt who states he does not have a pcp Pt noted with slurred speech States "I'm not going to pay this bill" Request to speak with EDP CM informed pt's ED RN and charge RN

## 2013-04-29 NOTE — H&P (Signed)
[facility] History and Physical  Jeffrey Black WUJ:[id number] DOB: 02-12-1961 DOA: 04/29/2013   PCP: No primary provider on file.   Chief Complaint: mechanical fall, Etoh intoxication  HPI:  52 year old male with a history of recurrent alcohol abuse and intoxication as well as psoriasis presents after an innocent bystander found the patient after he fell to the ground. The patient states that he has been drinking three quarters of a gallon of EverClear alcohol on a daily basis. The patient has been staying at [facility]. The patient was intoxicated and fell face first while he was walking down steps. The patient was inebriated, he was not able to get up. An innocent bystander found the patient and called EMS. The patient currently is awake and alert. He denies any other illegal drug use. He complains of chest discomfort that is sharp and stabbing ever since he fell. He does have some nausea and vomiting prior to coming to the hospital. He believes that this is likely related to his alcohol use. He denies any fevers, chills, hematemesis, hematochezia, melena, dysuria, hematuria. He does have some epigastric abdominal pain. The patient was recently discharged from [facility] on 04/20/2013 after a recent stay for suicidal ideation.  In the emergency department, the patient was initially lethargic and unable to provide any history. CT of the brain was negative for acute infarction. CT of the cervical spine was negative for fracture. Chest x-ray showed bibasilar atelectasis. X-ray of the pelvis was negative for fracture or dislocation. BMP revealed an anion gap of 26. CBC was unremarkable. His alcohol level was 456. Hepatic enzymes showed mild elevation of AST- 43. The patient gradually aroused, but became tachycardic and jittery. [facility] asked to admit due to concerns for DTs. The patient was given 1 mg IV Ativan followed by another 1 mg po in the emergency department. He has received 1 L of  normal saline. He continues to be tachycardic. Review of the old records shows that patient has been tachycardic in the past with a workup including TSH and echocardiogram which have been negative. In fact, the patient was started on metoprolol tartrate 12.5 mg twice a day, but the patient has not been taking it. 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 ETOH abuse   . Psoriasis    Past Surgical History  Procedure Laterality Date  . Left 4th finger surgery     Social History:  reports that he has been smoking Cigarettes.  He has a 20 pack-year smoking history. He has never used smokeless tobacco. He reports that  drinks alcohol. He reports that he does not use illicit drugs.   Family History  Problem Relation Age of Onset  . Stroke Father      No Known Allergies  Prior to Admission medications   Medication Sig Start Date End Date Taking? Authorizing Provider  escitalopram (LEXAPRO) 10 MG tablet Take 1 tablet (10 mg total) by mouth daily. For depression 04/20/13   Sanjuana Kava, NP  gabapentin (NEURONTIN) 300 MG capsule Take 1 capsule (300 mg total) by mouth 3  (three) times daily. For anxiety/pain control 04/20/13   Sanjuana Kava, NP  metoprolol tartrate (LOPRESSOR) 25 MG tablet Take 0.5 tablets (12.5 mg total) by mouth 2 (two) times daily. For high blood pressure control 04/23/13   John-Adam Bonk, MD  omeprazole (PRILOSEC) 20 MG capsule Take 1 capsule (20 mg total) by mouth daily as needed (for acid relux). For acid reflux 04/20/13   Sanjuana Kava, NP  QUEtiapine (SEROQUEL) 100 MG tablet Take 1 tablet (100 mg total) by mouth at bedtime. For mood control 04/20/13   Sanjuana Kava, NP    Review of Systems:  Constitutional:  No weight loss, night sweats, Fevers, chills, fatigue.  Head&Eyes: No headache.  No vision loss.  No eye pain or scotoma ENT:  No Difficulty swallowing,Tooth/dental problems,Sore throat,   Cardio-vascular:  No Orthopnea, PND, swelling in lower extremities,  dizziness, palpitations  GI:  No  abdominal pain, nausea, vomiting, diarrhea, loss of appetite, hematochezia, melena, heartburn, indigestion, Resp:  No shortness of breath with exertion or at rest. No cough. No coughing up of blood .No wheezing.No chest wall deformity  Skin:  Chronic rash of psoriasis GU:  no dysuria, change in color of urine, no urgency or frequency. No flank pain.  Musculoskeletal:  No joint pain or swelling. No decreased range of motion Psych:  No change in mood or affect.  Neurologic: No headache, no dysesthesia, no focal weakness, no vision loss.   Physical Exam: Filed Vitals:   04/29/13 0821 04/29/13 0823 04/29/13 1500 04/29/13 1558  BP: 127/86  106/88 106/88  Pulse: 115  126   Temp: 99 F (37.2 C)     TempSrc: Oral     Resp:   21   SpO2: 86% 100% 98%    General:  A&O x 2, NAD, nontoxic, pleasant/cooperative Head/Eye: No conjunctival hemorrhage, no icterus, [place]/AT, No nystagmus ENT:  No icterus,  No thrush, no pharyngeal exudate Neck:  No masses, no lymphadenpathy, no bruits CV:  RRR, no rub, no gallop, no S3 Lung:  CTAB, good air movement,  no wheeze, no rhonchi Abdomen: soft/epigastric pain, +BS, nondistended, no peritoneal signs Ext: No cyanosis,  No petechiae, No lymphangitis, No edema; scattered maculopapular rash with scaling throughout bilateral upper extremities, trunk, and back Neuro: CNII-XII intact, strength 4/5 in bilateral upper and lower extremities, no dysmetria  Labs on Admission:  Basic Metabolic Panel:  Recent Labs Lab 04/22/13 2021 04/29/13 0848  NA 148* 139  K 3.4* 3.5  CL 112 94*  CO2 26 19  GLUCOSE 119* 81  BUN 13 15  CREATININE 0.84 0.65  CALCIUM 8.8 8.7   Liver Function Tests:  Recent Labs Lab 04/29/13 0848  AST 43*  ALT 32  ALKPHOS 96  BILITOT 0.5  PROT 7.6  ALBUMIN 3.7   No results found for this basename: LIPASE, AMYLASE,  in the last 168 hours No results found for this basename: AMMONIA,  in the last 168 hours CBC:  Recent Labs Lab 04/22/13 2021 04/29/13 0848  WBC 7.7 7.2  NEUTROABS 5.0  --   HGB 14.2 14.7  HCT 42.3 42.7  MCV 96.4 91.2  PLT 222 186  Cardiac Enzymes: No results found for this basename: CKTOTAL, CKMB, CKMBINDEX, TROPONINI,  in the last 168 hours BNP: No components found with this basename: POCBNP,  CBG: No results found for this basename: GLUCAP,  in the last 168 hours  Radiological Exams on Admission: Ct Head Wo Contrast  04/29/2013   *RADIOLOGY REPORT*  Clinical Data:  Fall  CT HEAD WITHOUT CONTRAST CT CERVICAL SPINE WITHOUT CONTRAST  Technique:  Multidetector CT imaging of the head and cervical spine was performed following the standard protocol without intravenous contrast.  Multiplanar CT image reconstructions of the cervical spine were also generated.  Comparison:  CT 04/22/2013  CT HEAD  Findings: Ventricle size is normal.  Mild atrophy for age. Negative for hemorrhage mass or infarction.  No change from the prior study.  Negative for skull fracture.  IMPRESSION: Mild atrophy without acute abnormality.  CT CERVICAL SPINE  Findings: Image quality  degraded by patient motion.  Allowing for this no fracture is identified.  There is mild cervical disc degeneration.  IMPRESSION: Image quality degraded by motion.  No fracture or malalignment is identified.   Original Report Authenticated By: Janeece Riggers, M.D.   Ct Cervical Spine Wo Contrast  04/29/2013   *RADIOLOGY REPORT*  Clinical Data:  Fall  CT HEAD WITHOUT CONTRAST CT CERVICAL SPINE WITHOUT CONTRAST  Technique:  Multidetector CT imaging of the head and cervical spine was performed following the standard protocol without intravenous contrast.  Multiplanar CT image reconstructions of the cervical spine were also generated.  Comparison:  CT 04/22/2013  CT HEAD  Findings: Ventricle size is normal.  Mild atrophy for age. Negative for hemorrhage mass or infarction.  No change from the prior study.  Negative for skull fracture.  IMPRESSION: Mild atrophy without acute abnormality.  CT CERVICAL SPINE  Findings: Image quality degraded by patient motion.  Allowing for this no fracture is identified.  There is mild cervical disc degeneration.  IMPRESSION: Image quality degraded by motion.  No fracture or malalignment is identified.   Original Report Authenticated By: Janeece Riggers, M.D.   Dg Pelvis Portable  04/29/2013   *RADIOLOGY REPORT*  Clinical Data: 52 year old male status post fall.  Unresponsive. Pain.  PORTABLE PELVIS  Comparison: CT abdomen 02/25/2013.  Findings: Portable AP view the pelvis.  Femoral heads normally located.  Hip joint spaces preserved. Bone mineralization is within normal limits.  Both proximal femurs appear grossly intact.  Pelvis intact.  Sacral ala and SI joints appear intact.  No acute fracture identified.  Negative visible bowel gas pattern.  IMPRESSION: No acute fracture or dislocation identified about the pelvis.  If there is localizing hip pain, recommend follow-up hip series.   Original Report Authenticated By: Erskine Speed, M.D.   Dg Chest Port 1 View  04/29/2013   *RADIOLOGY  REPORT*  Clinical Data: History of fall.  Shortness of breath.  Weakness.  PORTABLE CHEST - 1 VIEW  Comparison: Chest x-ray 02/22/2013.  Findings: Lung volumes are low.  No pneumothorax.  Linear opacities in the lung bases bilaterally, favored to reflect subsegmental atelectasis.  No definite acute consolidative airspace disease.  No pleural effusions.  Crowding of the pulmonary vasculature, accentuated by low lung volumes, without frank pulmonary edema. Borderline cardiomegaly. The patient is rotated to the right on today's exam, resulting in distortion of the mediastinal contours and reduced diagnostic sensitivity and specificity for mediastinal pathology.  IMPRESSION: 1.  Low lung volumes with bibasilar subsegmental atelectasis.   Original Report Authenticated By: Trudie Reed,  M.D.    EKG: Independently reviewed. pending    Time spent:60 minutes Code Status:   FULL Family Communication:   No Family at bedside   Doranne Schmutz, DO  [facility] Pager [phone]  If 7PM-7AM, please contact night-coverage [email or web address] Password [facility] 04/29/2013, 4:39 PM

## 2013-04-29 NOTE — ED Notes (Signed)
Hard  Cervical collar applied

## 2013-04-29 NOTE — Progress Notes (Signed)
[facility] ED CM noted CM consult for medication needs  CM spoke with pt and was informed he recall speaking to the admission RN but wanted "more morphine for my shoulder" " I don't have trouble buying medicines"  Cm noted pt also with [facility] therefore he is not a [facility] MATCH program candidate

## 2013-04-29 NOTE — ED Notes (Signed)
Per EMS, bystander called after pt fell and was unable to get up.  Pt reports drinking everclear x 2 days

## 2013-04-29 NOTE — ED Notes (Signed)
[id number]> Expected date:<BR> Expected time:<BR> Means of arrival:<BR> Comments:<BR> etoh/fall

## 2013-04-29 NOTE — Progress Notes (Signed)
ED Cm noted pt without pcp CM went to see him but he was sleeping and did not respond to his name being called x 2 Respirations even and non labored

## 2013-04-30 ENCOUNTER — Inpatient Hospital Stay (HOSPITAL_COMMUNITY): Payer: PRIVATE HEALTH INSURANCE

## 2013-04-30 DIAGNOSIS — D649 Anemia, unspecified: Secondary | ICD-10-CM

## 2013-04-30 DIAGNOSIS — E872 Acidosis, unspecified: Secondary | ICD-10-CM

## 2013-04-30 DIAGNOSIS — D696 Thrombocytopenia, unspecified: Secondary | ICD-10-CM

## 2013-04-30 LAB — BASIC METABOLIC PANEL
CO2: 21 mEq/L (ref 19–32)
Calcium: 8.6 mg/dL (ref 8.4–10.5)
GFR calc non Af Amer: >90 mL/min (ref >90)
Glucose, Bld: 94 mg/dL (ref 70–99)
Potassium: 3.5 mEq/L (ref 3.5–5.1)
Sodium: 134 mEq/L — ABNORMAL LOW (ref 135–145)

## 2013-04-30 LAB — PROTIME-INR
INR: 1 (ref 0.00–1.49)
Prothrombin Time: 13.1 seconds (ref 11.6–15.2)

## 2013-04-30 LAB — CBC
Hemoglobin: 10.5 g/dL — ABNORMAL LOW (ref 13.0–17.0)
MCH: 30.7 pg (ref 26.0–34.0)
MCV: 91.4 fL (ref 78.0–100.0)
Platelets: 108 10*3/uL — ABNORMAL LOW (ref 150–400)
Platelets: 109 10*3/uL — ABNORMAL LOW (ref 150–400)
RBC: 3.42 MIL/uL — ABNORMAL LOW (ref 4.22–5.81)
RBC: 3.37 MIL/uL — ABNORMAL LOW (ref 4.22–5.81)
WBC: 6.1 10*3/uL (ref 4.0–10.5)

## 2013-04-30 LAB — URINALYSIS W MICROSCOPIC + REFLEX CULTURE
Glucose, UA: NEGATIVE mg/dL
Hgb urine dipstick: NEGATIVE
Ketones, ur: NEGATIVE mg/dL
Protein, ur: NEGATIVE mg/dL
pH: 6.5 (ref 5.0–8.0)

## 2013-04-30 LAB — RAPID URINE DRUG SCREEN, HOSP PERFORMED
Amphetamines: NOT DETECTED
Cocaine: NOT DETECTED
Opiates: POSITIVE — AB
Tetrahydrocannabinol: NOT DETECTED

## 2013-04-30 MED ORDER — MAGNESIUM SULFATE 40 MG/ML IJ SOLN
2.0000 g | Freq: Once | INTRAMUSCULAR | Status: AC
  Administered 2013-04-30: 2 g via INTRAVENOUS
  Filled 2013-04-30: qty 50

## 2013-04-30 MED ORDER — PANTOPRAZOLE SODIUM 40 MG IV SOLR
40.0000 mg | Freq: Two times a day (BID) | INTRAVENOUS | Status: DC
  Administered 2013-04-30 – 2013-05-01 (×3): 40 mg via INTRAVENOUS
  Filled 2013-04-30 (×4): qty 40

## 2013-04-30 MED ORDER — SODIUM CHLORIDE 0.9 % IV SOLN
INTRAVENOUS | Status: DC
  Administered 2013-04-30 – 2013-05-01 (×3): via INTRAVENOUS

## 2013-04-30 NOTE — Consult Note (Signed)
Reason for Consult: alcohol use Referring Physician: unknown.  Jeffrey Black is an 52 y.o. male.  HPI:    52 year old male with a history of recurrent alcohol abuse and intoxication as well as psoriasis presents after an innocent bystander found the patient after he fell to the ground. The patient states that he has been drinking three quarters of a gallon of EverClear alcohol on a daily basis. The patient has been staying at [facility]. The patient was intoxicated and fell face first while he was walking down steps. The patient was inebriated, he was not able to get up. A bystander found the patient and called EMS. . The patient was recently discharged from [facility] on 04/20/2013 after a recent stay for suicidal ideation.    Pt came here before for detox. Per pt he was in rehab few months ago and was sober for 3 weeks then he started drinking. interested for rehab now. Denies any si now. Poor historian with slurred speech now. Reports withdrawal symptoms now like shakes and confusion.   Past Medical History  Diagnosis Date  . ETOH abuse   . Psoriasis     Past Surgical History  Procedure Laterality Date  . Left 4th finger surgery      Family History  Problem Relation Age of Onset  . Stroke Father     Social History:  reports that he has been smoking Cigarettes.  He has a 20 pack-year smoking history. He has never used smokeless tobacco. He reports that  drinks alcohol. He reports that he does not use illicit drugs.  Allergies: No Known Allergies  Medications: I have reviewed the patient's current medications.  Results for orders placed during the hospital encounter of 04/29/13 (from the past 48 hour(s))  COMPREHENSIVE METABOLIC PANEL     Status: Abnormal   Collection Time    04/29/13  8:48 AM      Result Value Range   Sodium 139  135 - 145 mEq/L   Comment: REPEATED TO VERIFY   Potassium 3.5  3.5 - 5.1 mEq/L   Chloride 94 (*) 96 - 112 mEq/L   Comment: REPEATED  TO VERIFY   CO2 19  19 - 32 mEq/L   Comment: REPEATED TO VERIFY   Glucose, Bld 81  70 - 99 mg/dL   BUN 15  6 - 23 mg/dL   Creatinine, Ser 1.61  0.50 - 1.35 mg/dL   Calcium 8.7  8.4 - 09.6 mg/dL   Total Protein 7.6  6.0 - 8.3 g/dL   Albumin 3.7  3.5 - 5.2 g/dL   AST 43 (*) 0 - 37 U/L   ALT 32  0 - 53 U/L   Alkaline Phosphatase 96  39 - 117 U/L   Total Bilirubin 0.5  0.3 - 1.2 mg/dL   GFR calc non Af Amer >90  >90 mL/min   GFR calc Af Amer >90  >90 mL/min   Comment:            The eGFR has been calculated     using the CKD EPI equation.     This calculation has not been     validated in all clinical     situations.     eGFR's persistently     <90 mL/min signify     possible Chronic Kidney Disease.  CBC     Status: None   Collection Time    04/29/13  8:48 AM      Result  Value Range   WBC 7.2  4.0 - 10.5 K/uL   RBC 4.68  4.22 - 5.81 MIL/uL   Hemoglobin 14.7  13.0 - 17.0 g/dL   HCT 09.8  11.9 - 14.7 %   MCV 91.2  78.0 - 100.0 fL   MCH 31.4  26.0 - 34.0 pg   MCHC 34.4  30.0 - 36.0 g/dL   RDW 82.9  56.2 - 13.0 %   Platelets 186  150 - 400 K/uL  ETHANOL     Status: Abnormal   Collection Time    04/29/13  8:48 AM      Result Value Range   Alcohol, Ethyl (B) 456 (*) 0 - 11 mg/dL   Comment:            LOWEST DETECTABLE LIMIT FOR     SERUM ALCOHOL IS 11 mg/dL     FOR MEDICAL PURPOSES ONLY     CRITICAL RESULT CALLED TO, READ BACK BY AND VERIFIED WITH:     A. MABERRY RN AT 0945 ON 06.13.14 BY SHUEA  MRSA PCR SCREENING     Status: None   Collection Time    04/29/13  8:07 PM      Result Value Range   MRSA by PCR NEGATIVE  NEGATIVE   Comment:            The GeneXpert MRSA Assay (FDA     approved for NASAL specimens     only), is one component of a     comprehensive MRSA colonization     surveillance program. It is not     intended to diagnose MRSA     infection nor to guide or     monitor treatment for     MRSA infections.  TROPONIN I     Status: None   Collection Time     04/29/13  8:16 PM      Result Value Range   Troponin I <0.30  <0.30 ng/mL   Comment:            Due to the release kinetics of cTnI,     a negative result within the first hours     of the onset of symptoms does not rule out     myocardial infarction with certainty.     If myocardial infarction is still suspected,     repeat the test at appropriate intervals.  MAGNESIUM     Status: None   Collection Time    04/29/13  8:16 PM      Result Value Range   Magnesium 1.8  1.5 - 2.5 mg/dL  LIPASE, BLOOD     Status: None   Collection Time    04/29/13  8:16 PM      Result Value Range   Lipase 32  11 - 59 U/L  LACTIC ACID, PLASMA     Status: Abnormal   Collection Time    04/29/13  8:18 PM      Result Value Range   Lactic Acid, Venous 4.0 (*) 0.5 - 2.2 mmol/L  CBC     Status: Abnormal   Collection Time    04/30/13  2:10 AM      Result Value Range   WBC 5.4  4.0 - 10.5 K/uL   RBC 3.42 (*) 4.22 - 5.81 MIL/uL   Hemoglobin 10.5 (*) 13.0 - 17.0 g/dL   Comment: REPEATED TO VERIFY     DELTA CHECK NOTED   HCT 31.1 (*) 39.0 -  52.0 %   MCV 90.9  78.0 - 100.0 fL   MCH 30.7  26.0 - 34.0 pg   MCHC 33.8  30.0 - 36.0 g/dL   RDW 45.4  09.8 - 11.9 %   Platelets 108 (*) 150 - 400 K/uL   Comment: SPECIMEN CHECKED FOR CLOTS     PLATELET COUNT CONFIRMED BY SMEAR     DELTA CHECK NOTED  BASIC METABOLIC PANEL     Status: Abnormal   Collection Time    04/30/13  2:10 AM      Result Value Range   Sodium 134 (*) 135 - 145 mEq/L   Potassium 3.5  3.5 - 5.1 mEq/L   Chloride 97  96 - 112 mEq/L   CO2 21  19 - 32 mEq/L   Glucose, Bld 94  70 - 99 mg/dL   BUN 13  6 - 23 mg/dL   Creatinine, Ser 1.47  0.50 - 1.35 mg/dL   Calcium 8.6  8.4 - 82.9 mg/dL   GFR calc non Af Amer >90  >90 mL/min   GFR calc Af Amer >90  >90 mL/min   Comment:            The eGFR has been calculated     using the CKD EPI equation.     This calculation has not been     validated in all clinical     situations.     eGFR's  persistently     <90 mL/min signify     possible Chronic Kidney Disease.  PROTIME-INR     Status: None   Collection Time    04/30/13  2:10 AM      Result Value Range   Prothrombin Time 13.1  11.6 - 15.2 seconds   INR 1.00  0.00 - 1.49  TROPONIN I     Status: None   Collection Time    04/30/13  2:10 AM      Result Value Range   Troponin I <0.30  <0.30 ng/mL   Comment:            Due to the release kinetics of cTnI,     a negative result within the first hours     of the onset of symptoms does not rule out     myocardial infarction with certainty.     If myocardial infarction is still suspected,     repeat the test at appropriate intervals.  TROPONIN I     Status: None   Collection Time    04/30/13  8:46 AM      Result Value Range   Troponin I <0.30  <0.30 ng/mL   Comment:            Due to the release kinetics of cTnI,     a negative result within the first hours     of the onset of symptoms does not rule out     myocardial infarction with certainty.     If myocardial infarction is still suspected,     repeat the test at appropriate intervals.  CBC     Status: Abnormal   Collection Time    04/30/13  8:46 AM      Result Value Range   WBC 6.1  4.0 - 10.5 K/uL   RBC 3.37 (*) 4.22 - 5.81 MIL/uL   Hemoglobin 10.5 (*) 13.0 - 17.0 g/dL   HCT 56.2 (*) 13.0 - 86.5 %   MCV 91.4  78.0 - 100.0  fL   MCH 31.2  26.0 - 34.0 pg   MCHC 34.1  30.0 - 36.0 g/dL   RDW 40.9  81.1 - 91.4 %   Platelets 109 (*) 150 - 400 K/uL   Comment: RESULT REPEATED AND VERIFIED     SPECIMEN CHECKED FOR CLOTS     CONSISTENT WITH PREVIOUS RESULT  LACTIC ACID, PLASMA     Status: None   Collection Time    04/30/13  9:00 AM      Result Value Range   Lactic Acid, Venous 1.0  0.5 - 2.2 mmol/L  URINE RAPID DRUG SCREEN (HOSP PERFORMED)     Status: Abnormal   Collection Time    04/30/13  8:55 PM      Result Value Range   Opiates POSITIVE (*) NONE DETECTED   Cocaine NONE DETECTED  NONE DETECTED    Benzodiazepines POSITIVE (*) NONE DETECTED   Amphetamines NONE DETECTED  NONE DETECTED   Tetrahydrocannabinol NONE DETECTED  NONE DETECTED   Barbiturates NONE DETECTED  NONE DETECTED   Comment:            DRUG SCREEN FOR MEDICAL PURPOSES     ONLY.  IF CONFIRMATION IS NEEDED     FOR ANY PURPOSE, NOTIFY LAB     WITHIN 5 DAYS.                LOWEST DETECTABLE LIMITS     FOR URINE DRUG SCREEN     Drug Class       Cutoff (ng/mL)     Amphetamine      1000     Barbiturate      200     Benzodiazepine   200     Tricyclics       300     Opiates          300     Cocaine          300     THC              50  URINALYSIS W MICROSCOPIC + REFLEX CULTURE     Status: Abnormal   Collection Time    04/30/13  8:55 PM      Result Value Range   Color, Urine YELLOW  YELLOW   APPearance CLEAR  CLEAR   Specific Gravity, Urine 1.015  1.005 - 1.030   pH 6.5  5.0 - 8.0   Glucose, UA NEGATIVE  NEGATIVE mg/dL   Hgb urine dipstick NEGATIVE  NEGATIVE   Bilirubin Urine NEGATIVE  NEGATIVE   Ketones, ur NEGATIVE  NEGATIVE mg/dL   Protein, ur NEGATIVE  NEGATIVE mg/dL   Urobilinogen, UA 0.2  0.0 - 1.0 mg/dL   Nitrite NEGATIVE  NEGATIVE   Leukocytes, UA NEGATIVE  NEGATIVE   WBC, UA 0-2  <3 WBC/hpf   RBC / HPF 0-2  <3 RBC/hpf   Bacteria, UA FEW (*) RARE   Squamous Epithelial / LPF RARE  RARE    Dg Ribs Bilateral  04/30/2013   *RADIOLOGY REPORT*  Clinical Data: Fall.  Bilateral chest wall pain.  BILATERAL RIBS - 3+ VIEW  Comparison: 04/29/2013  Findings: A minimally-displaced right anterior sixth rib fracture is seen which appears acute. No acute left-sided rib fractures are identified.  A subacute healing nondisplaced rib fractures are seen involving the right lateral eighth and ninth ribs, and the posterior 11th rib.  There is also an incompletely healed left posterior 11th rib fracture which shows  some callus formation.  No evidence of pneumothorax or hemothorax.  Atelectasis is noted in the right lower lung  zone.  IMPRESSION:  1.  Acute mildly displaced right anterior sixth rib fracture. 2.  Other bilateral subacute, healing rib fractures, as described above.   Original Report Authenticated By: Myles Rosenthal, M.D.   Ct Head Wo Contrast  04/29/2013   *RADIOLOGY REPORT*  Clinical Data:  Fall  CT HEAD WITHOUT CONTRAST CT CERVICAL SPINE WITHOUT CONTRAST  Technique:  Multidetector CT imaging of the head and cervical spine was performed following the standard protocol without intravenous contrast.  Multiplanar CT image reconstructions of the cervical spine were also generated.  Comparison:  CT 04/22/2013  CT HEAD  Findings: Ventricle size is normal.  Mild atrophy for age. Negative for hemorrhage mass or infarction.  No change from the prior study.  Negative for skull fracture.  IMPRESSION: Mild atrophy without acute abnormality.  CT CERVICAL SPINE  Findings: Image quality degraded by patient motion.  Allowing for this no fracture is identified.  There is mild cervical disc degeneration.  IMPRESSION: Image quality degraded by motion.  No fracture or malalignment is identified.   Original Report Authenticated By: Janeece Riggers, M.D.   Ct Cervical Spine Wo Contrast  04/29/2013   *RADIOLOGY REPORT*  Clinical Data:  Fall  CT HEAD WITHOUT CONTRAST CT CERVICAL SPINE WITHOUT CONTRAST  Technique:  Multidetector CT imaging of the head and cervical spine was performed following the standard protocol without intravenous contrast.  Multiplanar CT image reconstructions of the cervical spine were also generated.  Comparison:  CT 04/22/2013  CT HEAD  Findings: Ventricle size is normal.  Mild atrophy for age. Negative for hemorrhage mass or infarction.  No change from the prior study.  Negative for skull fracture.  IMPRESSION: Mild atrophy without acute abnormality.  CT CERVICAL SPINE  Findings: Image quality degraded by patient motion.  Allowing for this no fracture is identified.  There is mild cervical disc degeneration.  IMPRESSION:  Image quality degraded by motion.  No fracture or malalignment is identified.   Original Report Authenticated By: Janeece Riggers, M.D.   Dg Pelvis Portable  04/29/2013   *RADIOLOGY REPORT*  Clinical Data: 52 year old male status post fall.  Unresponsive. Pain.  PORTABLE PELVIS  Comparison: CT abdomen 02/25/2013.  Findings: Portable AP view the pelvis.  Femoral heads normally located.  Hip joint spaces preserved. Bone mineralization is within normal limits.  Both proximal femurs appear grossly intact.  Pelvis intact.  Sacral ala and SI joints appear intact.  No acute fracture identified.  Negative visible bowel gas pattern.  IMPRESSION: No acute fracture or dislocation identified about the pelvis.  If there is localizing hip pain, recommend follow-up hip series.   Original Report Authenticated By: Erskine Speed, M.D.   Dg Chest Port 1 View  04/29/2013   *RADIOLOGY REPORT*  Clinical Data: History of fall.  Shortness of breath.  Weakness.  PORTABLE CHEST - 1 VIEW  Comparison: Chest x-ray 02/22/2013.  Findings: Lung volumes are low.  No pneumothorax.  Linear opacities in the lung bases bilaterally, favored to reflect subsegmental atelectasis.  No definite acute consolidative airspace disease.  No pleural effusions.  Crowding of the pulmonary vasculature, accentuated by low lung volumes, without frank pulmonary edema. Borderline cardiomegaly. The patient is rotated to the right on today's exam, resulting in distortion of the mediastinal contours and reduced diagnostic sensitivity and specificity for mediastinal pathology.  IMPRESSION: 1.  Low lung volumes with bibasilar subsegmental  atelectasis.   Original Report Authenticated By: Trudie Reed, M.D.    ROS Blood pressure 151/95, pulse 104, temperature 98.1 F (36.7 C), temperature source Oral, resp. rate 20, height 5\' 11"  (1.803 m), weight 92.4 kg (203 lb 11.3 oz), SpO2 95.00%. Physical Exam    Status Examination/Evaluation:  Appearance: on bed   Eye  Contact:: poor   Speech: slurred  Volume: Normal   Mood: depressed   Affect: ristricted   Thought Process: confused at times  Orientation: Full   Thought Content: NO AVH   Suicidal Thoughts: No   Homicidal Thoughts: no   Memory: Recent; Poor   Judgement: Impaired   Insight: Lacking   Psychomotor Activity: Normal   Concentration: Fair   Recall: Fair   Akathisia: No   Assessment:  AXIS I: alcohol dep,  AXIS II: Deferred  AXIS III: see emdical hx  ?   ?  ?   ?  ?  ?   ?  ?  ?   ?  ?  ?   ?  ?  ?   AXIS IV: problems related to social environment, problems with access to health care services and problems with primary support group  AXIS V: 30 ?    Treatment Plan/Recommendations:    1.will recommend drug rehab after detox/medical clearance. Psy SW can help with that .  2. Not intersted for any meds at this time. Will consider later.   3. Will follow as nedded   Wonda Cerise 04/30/2013, 10:39 PM

## 2013-04-30 NOTE — Progress Notes (Signed)
MD notified of 11 beats SVT. Pt asymptomatic. MD says we will continue to monitor.

## 2013-04-30 NOTE — Progress Notes (Signed)
[facility] PROGRESS NOTE  Jeffrey Black ZOX:[id number] DOB: 25-Sep-1961 DOA: 04/29/2013 PCP: No primary provider on file.  Assessment/Plan:  Alcohol intoxication with early signs of withdrawal  -Continue alcohol withdrawal protocol  -Continue thiamine, folic acid, multivitamin  -Urine drug screen  -Psych consulted for inpatient evaluation.   Tachycardia/SVT -Has been a chronic problem. -Continue IV fluids  -04/15/13 TSH 1.202  -Continue with metoprolol.  -Replete Mg, check electrolytes this morning.   Abdominal pain with vomiting  -Denies abdominal pain.  -Likely due to alcoholic gastritis  -Continue PPI, change to IV.  -Continue IV fluids  -Lipase negative.   Tobacco use  -Smokes 1-1/2 packs per day x30 years  -Nicoderm patch   Chest pain  -Likely due to trauma from falling  -Morphine when necessary  - troponin times 2 negative.  -EKG  -Will order ribs x ray.   Metabolic acidosis  -Likely due to alcoholic ketosis  - lactic acid at 4. Repeat this am.  -Continue IV fluids   Fall:secondary to alcohol intoxication. CT head negative.  Anemia: repeat HB. Check guaiac stool. Hold Lovenox, start scd.   Code Status: Full Code.  Family Communication: Care discussed with patient.  Disposition Plan: Remain inpatient.    Consultants:  Psych  Procedures:  none  Antibiotics:  none  HPI/Subjective: Patient denies abdominal pain. He relates chest pain, right side, pain on palpation.    Objective: Filed Vitals:   04/29/13 1803 04/29/13 1945 04/30/13 0343 04/30/13 0700  BP: 126/97 131/85 126/76 130/72  Pulse: 135 123 108 104  Temp:  98.5 F (36.9 C)  98.4 F (36.9 C)  TempSrc:  Oral  Oral  Resp:  20  20  Height:  5\' 11"  (1.803 m)    Weight:  92.4 kg (203 lb 11.3 oz)    SpO2:  95%  97%    Intake/Output Summary (Last 24 hours) at 04/30/13 0816 Last data filed at 04/30/13 0617  Gross per 24 hour  Intake    985 ml  Output      0 ml  Net    985 ml    Filed Weights   04/29/13 1945  Weight: 92.4 kg (203 lb 11.3 oz)    Exam:   General:  In no distress, eyes close, answer questions.   Cardiovascular: S 1, S 2 tachycardic, RRR  Respiratory: CTA  Abdomen: Bs present, soft, NT  Musculoskeletal: no edema.   Data Reviewed: Basic Metabolic Panel:  Recent Labs Lab 04/29/13 0848 04/29/13 2016 04/30/13 0210  NA 139  --  134*  K 3.5  --  3.5  CL 94*  --  97  CO2 19  --  21  GLUCOSE 81  --  94  BUN 15  --  13  CREATININE 0.65  --  0.55  CALCIUM 8.7  --  8.6  MG  --  1.8  --    Liver Function Tests:  Recent Labs Lab 04/29/13 0848  AST 43*  ALT 32  ALKPHOS 96  BILITOT 0.5  PROT 7.6  ALBUMIN 3.7    Recent Labs Lab 04/29/13 2016  LIPASE 32   No results found for this basename: AMMONIA,  in the last 168 hours CBC:  Recent Labs Lab 04/29/13 0848 04/30/13 0210  WBC 7.2 5.4  HGB 14.7 10.5*  HCT 42.7 31.1*  MCV 91.2 90.9  PLT 186 108*   Cardiac Enzymes:  Recent Labs Lab 04/29/13 2016 04/30/13 0210  TROPONINI <0.30 <0.30  BNP (last 3 results)  Recent Labs  11/08/12 0540  PROBNP 34.6   CBG: No results found for this basename: GLUCAP,  in the last 168 hours  Recent Results (from the past 240 hour(s))  MRSA PCR SCREENING     Status: None   Collection Time    04/29/13  8:07 PM      Result Value Range Status   MRSA by PCR NEGATIVE  NEGATIVE Final   Comment:            The GeneXpert MRSA Assay (FDA     approved for NASAL specimens     only), is one component of a     comprehensive MRSA colonization     surveillance program. It is not     intended to diagnose MRSA     infection nor to guide or     monitor treatment for     MRSA infections.     Studies: Ct Head Wo Contrast  04/29/2013   *RADIOLOGY REPORT*  Clinical Data:  Fall  CT HEAD WITHOUT CONTRAST CT CERVICAL SPINE WITHOUT CONTRAST  Technique:  Multidetector CT imaging of the head and cervical spine was performed following the  standard protocol without intravenous contrast.  Multiplanar CT image reconstructions of the cervical spine were also generated.  Comparison:  CT 04/22/2013  CT HEAD  Findings: Ventricle size is normal.  Mild atrophy for age. Negative for hemorrhage mass or infarction.  No change from the prior study.  Negative for skull fracture.  IMPRESSION: Mild atrophy without acute abnormality.  CT CERVICAL SPINE  Findings: Image quality degraded by patient motion.  Allowing for this no fracture is identified.  There is mild cervical disc degeneration.  IMPRESSION: Image quality degraded by motion.  No fracture or malalignment is identified.   Original Report Authenticated By: Janeece Riggers, M.D.   Ct Cervical Spine Wo Contrast  04/29/2013   *RADIOLOGY REPORT*  Clinical Data:  Fall  CT HEAD WITHOUT CONTRAST CT CERVICAL SPINE WITHOUT CONTRAST  Technique:  Multidetector CT imaging of the head and cervical spine was performed following the standard protocol without intravenous contrast.  Multiplanar CT image reconstructions of the cervical spine were also generated.  Comparison:  CT 04/22/2013  CT HEAD  Findings: Ventricle size is normal.  Mild atrophy for age. Negative for hemorrhage mass or infarction.  No change from the prior study.  Negative for skull fracture.  IMPRESSION: Mild atrophy without acute abnormality.  CT CERVICAL SPINE  Findings: Image quality degraded by patient motion.  Allowing for this no fracture is identified.  There is mild cervical disc degeneration.  IMPRESSION: Image quality degraded by motion.  No fracture or malalignment is identified.   Original Report Authenticated By: Janeece Riggers, M.D.   Dg Pelvis Portable  04/29/2013   *RADIOLOGY REPORT*  Clinical Data: 52 year old male status post fall.  Unresponsive. Pain.  PORTABLE PELVIS  Comparison: CT abdomen 02/25/2013.  Findings: Portable AP view the pelvis.  Femoral heads normally located.  Hip joint spaces preserved. Bone mineralization is within  normal limits.  Both proximal femurs appear grossly intact.  Pelvis intact.  Sacral ala and SI joints appear intact.  No acute fracture identified.  Negative visible bowel gas pattern.  IMPRESSION: No acute fracture or dislocation identified about the pelvis.  If there is localizing hip pain, recommend follow-up hip series.   Original Report Authenticated By: Erskine Speed, M.D.   Dg Chest Port 1 View  04/29/2013   *RADIOLOGY REPORT*  Clinical Data: History of fall.  Shortness of breath.  Weakness.  PORTABLE CHEST - 1 VIEW  Comparison: Chest x-ray 02/22/2013.  Findings: Lung volumes are low.  No pneumothorax.  Linear opacities in the lung bases bilaterally, favored to reflect subsegmental atelectasis.  No definite acute consolidative airspace disease.  No pleural effusions.  Crowding of the pulmonary vasculature, accentuated by low lung volumes, without frank pulmonary edema. Borderline cardiomegaly. The patient is rotated to the right on today's exam, resulting in distortion of the mediastinal contours and reduced diagnostic sensitivity and specificity for mediastinal pathology.  IMPRESSION: 1.  Low lung volumes with bibasilar subsegmental atelectasis.   Original Report Authenticated By: Trudie Reed, M.D.    Scheduled Meds: . enoxaparin (LOVENOX) injection  40 mg Subcutaneous Q24H  . escitalopram  10 mg Oral Daily  . folic acid  1 mg Oral Daily  . LORazepam  0-4 mg Oral Q6H   Followed by  . [START ON 05/01/2013] LORazepam  0-4 mg Oral Q12H  . magnesium sulfate 1 - 4 g bolus IVPB  2 g Intravenous Once  . metoprolol tartrate  12.5 mg Oral BID  . multivitamin with minerals  1 tablet Oral Daily  . nicotine  14 mg Transdermal Daily  . pantoprazole  40 mg Oral Daily  . QUEtiapine  100 mg Oral QHS  . sodium chloride  3 mL Intravenous Q12H  . thiamine  100 mg Oral Daily   Or  . thiamine  100 mg Intravenous Daily   Continuous Infusions: . sodium chloride 0.9 % 1,000 mL with potassium chloride 20  mEq infusion 100 mL/hr at 04/30/13 4098    Active Problems:   Alcohol withdrawal syndrome   Elevated liver enzymes   Depression   Alcohol intoxication   Alcohol dependence   Tachycardia    Time spent: 35 minutes.     Rehema Muffley  [facility] Pager [phone]. If 7PM-7AM, please contact night-coverage at [email or web address], password [facility] 04/30/2013, 8:16 AM  LOS: 1 day

## 2013-05-01 DIAGNOSIS — E876 Hypokalemia: Secondary | ICD-10-CM

## 2013-05-01 DIAGNOSIS — E872 Acidosis: Secondary | ICD-10-CM

## 2013-05-01 LAB — BASIC METABOLIC PANEL
CO2: 28 mEq/L (ref 19–32)
Chloride: 99 mEq/L (ref 96–112)
Creatinine, Ser: 0.58 mg/dL (ref 0.50–1.35)
Sodium: 135 mEq/L (ref 135–145)

## 2013-05-01 LAB — CBC
HCT: 31 % — ABNORMAL LOW (ref 39.0–52.0)
MCV: 91.2 fL (ref 78.0–100.0)
RBC: 3.4 MIL/uL — ABNORMAL LOW (ref 4.22–5.81)
WBC: 5.8 10*3/uL (ref 4.0–10.5)

## 2013-05-01 MED ORDER — POTASSIUM CHLORIDE CRYS ER 20 MEQ PO TBCR
40.0000 meq | EXTENDED_RELEASE_TABLET | Freq: Once | ORAL | Status: AC
  Administered 2013-05-01: 40 meq via ORAL
  Filled 2013-05-01: qty 2

## 2013-05-01 MED ORDER — MAGNESIUM OXIDE 400 (241.3 MG) MG PO TABS
200.0000 mg | ORAL_TABLET | Freq: Two times a day (BID) | ORAL | Status: DC
  Administered 2013-05-01 – 2013-05-04 (×7): 200 mg via ORAL
  Filled 2013-05-01 (×8): qty 0.5

## 2013-05-01 MED ORDER — POTASSIUM CHLORIDE 10 MEQ/100ML IV SOLN
10.0000 meq | INTRAVENOUS | Status: AC
  Administered 2013-05-01 (×4): 10 meq via INTRAVENOUS
  Filled 2013-05-01 (×4): qty 100

## 2013-05-01 MED ORDER — HYDROCODONE-ACETAMINOPHEN 5-325 MG PO TABS
1.0000 | ORAL_TABLET | Freq: Four times a day (QID) | ORAL | Status: DC | PRN
  Administered 2013-05-01 – 2013-05-04 (×7): 1 via ORAL
  Filled 2013-05-01 (×8): qty 1

## 2013-05-01 MED ORDER — PANTOPRAZOLE SODIUM 40 MG PO TBEC
40.0000 mg | DELAYED_RELEASE_TABLET | Freq: Two times a day (BID) | ORAL | Status: DC
  Administered 2013-05-01 – 2013-05-04 (×7): 40 mg via ORAL
  Filled 2013-05-01 (×8): qty 1

## 2013-05-01 NOTE — Progress Notes (Signed)
Clinical Social Work Department BRIEF PSYCHOSOCIAL ASSESSMENT 05/01/2013  Patient:  Jeffrey Black, Jeffrey Black     Account Number:  [id number]     Admit date:  04/29/2013  Clinical Social Worker:  Doroteo Glassman  Date/Time:  05/01/2013 01:29 PM  Referred by:  Physician  Date Referred:  05/01/2013 Referred for  Substance Abuse   Other Referral:   Interview type:  Patient Other interview type:    PSYCHOSOCIAL DATA Living Status:  ALONE Admitted from facility:   Level of care:   Primary support name:  Gaetano Net, Sr. Primary support relationship to patient:  PARENT Degree of support available:   adequate    CURRENT CONCERNS Current Concerns  Substance Abuse   Other Concerns:    SOCIAL WORK ASSESSMENT / PLAN Met with Pt to discuss current admission and d/c plan.    Pt stated that he has been dealing with ETOH for a very long time and that he "knows how the game is played."  Pt stated that he has been to inpt and outpt programs and that it all boils down to one thing: hope.  Pt stated, several times, that he still has hope and that he will continue to go to AA and work the program.    Pt was interested in IOP at [facility].  He is not interested in inpt tx.  Pt reported that he is currently living off of money that he had saved and that the money is quickly running out.  Pt stated that he needs to get a job and is hoping to meet someone with whom he can room with so as to save money.    CSW gave Pt info on [facility] IOP.    CSW thanked Pt for his time.    No further CSW needs identified.    CSW to sign off.   Assessment/plan status:  No Further Intervention Required Other assessment/ plan:   Information/referral to community resources:   IOP at [facility] handout    PATIENT'S/FAMILY'S RESPONSE TO PLAN OF CARE: Pt thanked CSW for time and assistance.   Providence Crosby, LCSWA Clinical Social Work [phone]

## 2013-05-01 NOTE — Progress Notes (Signed)
[facility] PROGRESS NOTE  Jeffrey Black WUJ:[id number] DOB: 26-Jun-1961 DOA: 04/29/2013 PCP: No primary provider on file.  Assessment/Plan:  Alcohol intoxication with early signs of withdrawal  -Continue alcohol withdrawal protocol  -Continue thiamine, folic acid, multivitamin  -Urine drug screen negative for cocaine.  -Patient need  inpatient alcohol rehab..   Hypokalemia: Replete IV and with one time dose oral. Mg at 1.8  Tachycardia/SVT -Improved.  -Has been a chronic problem. -Continue IV fluids  -04/15/13 TSH 1.202  -Continue with metoprolol.  -Replete K.   Abdominal pain with vomiting  -Resolved.  -Likely due to alcoholic gastritis  -Continue PPI, change to po. -Continue IV fluids  -Lipase negative.   Tobacco use  -Smokes 1-1/2 packs per day x30 years  -Nicoderm patch   Chest pain  -Secondary to Rib fracture.  -Morphine PRN, Start Vicodin. - troponin times 2 negative.  -Incentive spirometry.   Metabolic acidosis  -Resolved.  -Likely due to alcoholic ketosis  - lactic acid at 4. Repeated at 1.  -Continue IV fluids   Fall:secondary to alcohol intoxication. CT head negative.  Anemia: Hb stable.  Check guaiac stool. Hold Lovenox, start scd. Denies blood in the stool.  Thrombocytopenia: Probably Secondary to BM supresion from alcohol.   Code Status: Full Code.  Family Communication: Care discussed with patient.  Disposition Plan: Remain inpatient.    Consultants:  Psych  Procedures:  none  Antibiotics:  none  HPI/Subjective: Denies abdominal pain, tolerating diet. No blood in the stool. Speech clear. Tremors of hands.  Objective: Filed Vitals:   04/30/13 0700 04/30/13 1605 04/30/13 2200 05/01/13 0600  BP: 130/72 151/95 156/92 139/88  Pulse: 104 104 89 85  Temp: 98.4 F (36.9 C) 98.1 F (36.7 C) 98.1 F (36.7 C) 98.9 F (37.2 C)  TempSrc: Oral Oral Oral Oral  Resp: 20 20 20 16   Height:      Weight:      SpO2: 97% 95% 97% 96%     Intake/Output Summary (Last 24 hours) at 05/01/13 1247 Last data filed at 05/01/13 0813  Gross per 24 hour  Intake    360 ml  Output    300 ml  Net     60 ml   Filed Weights   04/29/13 1945  Weight: 92.4 kg (203 lb 11.3 oz)    Exam:   General: awake in no distress,   Cardiovascular: S 1, S 2 tachycardic, RRR  Respiratory: CTA  Abdomen: Bs present, soft, NT  Musculoskeletal: no edema.   Data Reviewed: Basic Metabolic Panel:  Recent Labs Lab 04/29/13 0848 04/29/13 2016 04/30/13 0210 05/01/13 0453 05/01/13 0500  NA 139  --  134* 135  --   K 3.5  --  3.5 2.9*  --   CL 94*  --  97 99  --   CO2 19  --  21 28  --   GLUCOSE 81  --  94 110*  --   BUN 15  --  13 9  --   CREATININE 0.65  --  0.55 0.58  --   CALCIUM 8.7  --  8.6 8.7  --   MG  --  1.8  --   --  1.8   Liver Function Tests:  Recent Labs Lab 04/29/13 0848  AST 43*  ALT 32  ALKPHOS 96  BILITOT 0.5  PROT 7.6  ALBUMIN 3.7    Recent Labs Lab 04/29/13 2016  LIPASE 32   No results found for this  basename: AMMONIA,  in the last 168 hours CBC:  Recent Labs Lab 04/29/13 0848 04/30/13 0210 04/30/13 0846 05/01/13 0453  WBC 7.2 5.4 6.1 5.8  HGB 14.7 10.5* 10.5* 10.5*  HCT 42.7 31.1* 30.8* 31.0*  MCV 91.2 90.9 91.4 91.2  PLT 186 108* 109* 94*   Cardiac Enzymes:  Recent Labs Lab 04/29/13 2016 04/30/13 0210 04/30/13 0846  TROPONINI <0.30 <0.30 <0.30   BNP (last 3 results)  Recent Labs  11/08/12 0540  PROBNP 34.6   CBG: No results found for this basename: GLUCAP,  in the last 168 hours  Recent Results (from the past 240 hour(s))  MRSA PCR SCREENING     Status: None   Collection Time    04/29/13  8:07 PM      Result Value Range Status   MRSA by PCR NEGATIVE  NEGATIVE Final   Comment:            The GeneXpert MRSA Assay (FDA     approved for NASAL specimens     only), is one component of a     comprehensive MRSA colonization     surveillance program. It is not      intended to diagnose MRSA     infection nor to guide or     monitor treatment for     MRSA infections.     Studies: Dg Ribs Bilateral  04/30/2013   *RADIOLOGY REPORT*  Clinical Data: Fall.  Bilateral chest wall pain.  BILATERAL RIBS - 3+ VIEW  Comparison: 04/29/2013  Findings: A minimally-displaced right anterior sixth rib fracture is seen which appears acute. No acute left-sided rib fractures are identified.  A subacute healing nondisplaced rib fractures are seen involving the right lateral eighth and ninth ribs, and the posterior 11th rib.  There is also an incompletely healed left posterior 11th rib fracture which shows some callus formation.  No evidence of pneumothorax or hemothorax.  Atelectasis is noted in the right lower lung zone.  IMPRESSION:  1.  Acute mildly displaced right anterior sixth rib fracture. 2.  Other bilateral subacute, healing rib fractures, as described above.   Original Report Authenticated By: Myles Rosenthal, M.D.    Scheduled Meds: . escitalopram  10 mg Oral Daily  . folic acid  1 mg Oral Daily  . LORazepam  0-4 mg Oral Q6H   Followed by  . LORazepam  0-4 mg Oral Q12H  . metoprolol tartrate  12.5 mg Oral BID  . multivitamin with minerals  1 tablet Oral Daily  . nicotine  14 mg Transdermal Daily  . pantoprazole (PROTONIX) IV  40 mg Intravenous Q12H  . QUEtiapine  100 mg Oral QHS  . sodium chloride  3 mL Intravenous Q12H  . thiamine  100 mg Oral Daily   Or  . thiamine  100 mg Intravenous Daily   Continuous Infusions: . sodium chloride 100 mL/hr at 05/01/13 0527  . sodium chloride 0.9 % 1,000 mL with potassium chloride 20 mEq infusion 100 mL/hr at 04/30/13 1610    Active Problems:   Alcohol withdrawal syndrome   Elevated liver enzymes   Depression   Alcohol intoxication   Alcohol dependence   Tachycardia   Anemia   Metabolic acidosis    Time spent: 35 minutes.     Jeffrey Black  [facility] Pager [phone]. If 7PM-7AM, please contact  night-coverage at [email or web address], password [facility] 05/01/2013, 12:47 PM  LOS: 2 days

## 2013-05-01 NOTE — Consult Note (Signed)
Reason for Consult: alcohol use Referring Physician: unknown.  Jeffrey Black is an 52 y.o. male.  HPI:    52 year old male with a history of recurrent alcohol abuse and intoxication as well as psoriasis presents after an innocent bystander found the patient after he fell to the ground. The patient states that he has been drinking three quarters of a gallon of EverClear alcohol on a daily basis. The patient has been staying at [facility]. The patient was intoxicated and fell face first while he was walking down steps. The patient was inebriated, he was not able to get up. A bystander found the patient and called EMS. . The patient was recently discharged from [facility] on 04/20/2013 after a recent Black for suicidal ideation. Pt came here before for detox. Per pt he was in rehab few months ago and was sober for 3 weeks then he started drinking. interested for rehab now    Interval Hx:  Better mood today. Family visited today. Has chest pain this AM. Denies any si . Marland Kitchen Reports withdrawal symptoms now but per pt it is getting better gradually now.   Past Medical History  Diagnosis Date  . ETOH abuse   . Psoriasis     Past Surgical History  Procedure Laterality Date  . Left 4th finger surgery      Family History  Problem Relation Age of Onset  . Stroke Father     Social History:  reports that he has been smoking Cigarettes.  He has a 20 pack-year smoking history. He has never used smokeless tobacco. He reports that  drinks alcohol. He reports that he does not use illicit drugs.  Allergies: No Known Allergies  Medications: I have reviewed the patient's current medications.  Results for orders placed during the hospital encounter of 04/29/13 (from the past 48 hour(s))  CBC     Status: Abnormal   Collection Time    04/30/13  2:10 AM      Result Value Range   WBC 5.4  4.0 - 10.5 K/uL   RBC 3.42 (*) 4.22 - 5.81 MIL/uL   Hemoglobin 10.5 (*) 13.0 - 17.0 g/dL   Comment:  REPEATED TO VERIFY     DELTA CHECK NOTED   HCT 31.1 (*) 39.0 - 52.0 %   MCV 90.9  78.0 - 100.0 fL   MCH 30.7  26.0 - 34.0 pg   MCHC 33.8  30.0 - 36.0 g/dL   RDW 16.1  09.6 - 04.5 %   Platelets 108 (*) 150 - 400 K/uL   Comment: SPECIMEN CHECKED FOR CLOTS     PLATELET COUNT CONFIRMED BY SMEAR     DELTA CHECK NOTED  BASIC METABOLIC PANEL     Status: Abnormal   Collection Time    04/30/13  2:10 AM      Result Value Range   Sodium 134 (*) 135 - 145 mEq/L   Potassium 3.5  3.5 - 5.1 mEq/L   Chloride 97  96 - 112 mEq/L   CO2 21  19 - 32 mEq/L   Glucose, Bld 94  70 - 99 mg/dL   BUN 13  6 - 23 mg/dL   Creatinine, Ser 4.09  0.50 - 1.35 mg/dL   Calcium 8.6  8.4 - 81.1 mg/dL   GFR calc non Af Amer >90  >90 mL/min   GFR calc Af Amer >90  >90 mL/min   Comment:  The eGFR has been calculated     using the CKD EPI equation.     This calculation has not been     validated in all clinical     situations.     eGFR's persistently     <90 mL/min signify     possible Chronic Kidney Disease.  PROTIME-INR     Status: None   Collection Time    04/30/13  2:10 AM      Result Value Range   Prothrombin Time 13.1  11.6 - 15.2 seconds   INR 1.00  0.00 - 1.49  TROPONIN I     Status: None   Collection Time    04/30/13  2:10 AM      Result Value Range   Troponin I <0.30  <0.30 ng/mL   Comment:            Due to the release kinetics of cTnI,     a negative result within the first hours     of the onset of symptoms does not rule out     myocardial infarction with certainty.     If myocardial infarction is still suspected,     repeat the test at appropriate intervals.  TROPONIN I     Status: None   Collection Time    04/30/13  8:46 AM      Result Value Range   Troponin I <0.30  <0.30 ng/mL   Comment:            Due to the release kinetics of cTnI,     a negative result within the first hours     of the onset of symptoms does not rule out     myocardial infarction with certainty.      If myocardial infarction is still suspected,     repeat the test at appropriate intervals.  CBC     Status: Abnormal   Collection Time    04/30/13  8:46 AM      Result Value Range   WBC 6.1  4.0 - 10.5 K/uL   RBC 3.37 (*) 4.22 - 5.81 MIL/uL   Hemoglobin 10.5 (*) 13.0 - 17.0 g/dL   HCT 84.6 (*) 96.2 - 95.2 %   MCV 91.4  78.0 - 100.0 fL   MCH 31.2  26.0 - 34.0 pg   MCHC 34.1  30.0 - 36.0 g/dL   RDW 84.1  32.4 - 40.1 %   Platelets 109 (*) 150 - 400 K/uL   Comment: RESULT REPEATED AND VERIFIED     SPECIMEN CHECKED FOR CLOTS     CONSISTENT WITH PREVIOUS RESULT  LACTIC ACID, PLASMA     Status: None   Collection Time    04/30/13  9:00 AM      Result Value Range   Lactic Acid, Venous 1.0  0.5 - 2.2 mmol/L  URINE RAPID DRUG SCREEN (HOSP PERFORMED)     Status: Abnormal   Collection Time    04/30/13  8:55 PM      Result Value Range   Opiates POSITIVE (*) NONE DETECTED   Cocaine NONE DETECTED  NONE DETECTED   Benzodiazepines POSITIVE (*) NONE DETECTED   Amphetamines NONE DETECTED  NONE DETECTED   Tetrahydrocannabinol NONE DETECTED  NONE DETECTED   Barbiturates NONE DETECTED  NONE DETECTED   Comment:            DRUG SCREEN FOR MEDICAL PURPOSES     ONLY.  IF CONFIRMATION IS NEEDED  FOR ANY PURPOSE, NOTIFY LAB     WITHIN 5 DAYS.                LOWEST DETECTABLE LIMITS     FOR URINE DRUG SCREEN     Drug Class       Cutoff (ng/mL)     Amphetamine      1000     Barbiturate      200     Benzodiazepine   200     Tricyclics       300     Opiates          300     Cocaine          300     THC              50  URINALYSIS W MICROSCOPIC + REFLEX CULTURE     Status: Abnormal   Collection Time    04/30/13  8:55 PM      Result Value Range   Color, Urine YELLOW  YELLOW   APPearance CLEAR  CLEAR   Specific Gravity, Urine 1.015  1.005 - 1.030   pH 6.5  5.0 - 8.0   Glucose, UA NEGATIVE  NEGATIVE mg/dL   Hgb urine dipstick NEGATIVE  NEGATIVE   Bilirubin Urine NEGATIVE  NEGATIVE    Ketones, ur NEGATIVE  NEGATIVE mg/dL   Protein, ur NEGATIVE  NEGATIVE mg/dL   Urobilinogen, UA 0.2  0.0 - 1.0 mg/dL   Nitrite NEGATIVE  NEGATIVE   Leukocytes, UA NEGATIVE  NEGATIVE   WBC, UA 0-2  <3 WBC/hpf   RBC / HPF 0-2  <3 RBC/hpf   Bacteria, UA FEW (*) RARE   Squamous Epithelial / LPF RARE  RARE  BASIC METABOLIC PANEL     Status: Abnormal   Collection Time    05/01/13  4:53 AM      Result Value Range   Sodium 135  135 - 145 mEq/L   Potassium 2.9 (*) 3.5 - 5.1 mEq/L   Comment: DELTA CHECK NOTED     REPEATED TO VERIFY   Chloride 99  96 - 112 mEq/L   CO2 28  19 - 32 mEq/L   Glucose, Bld 110 (*) 70 - 99 mg/dL   BUN 9  6 - 23 mg/dL   Creatinine, Ser 1.61  0.50 - 1.35 mg/dL   Calcium 8.7  8.4 - 09.6 mg/dL   GFR calc non Af Amer >90  >90 mL/min   GFR calc Af Amer >90  >90 mL/min   Comment:            The eGFR has been calculated     using the CKD EPI equation.     This calculation has not been     validated in all clinical     situations.     eGFR's persistently     <90 mL/min signify     possible Chronic Kidney Disease.  CBC     Status: Abnormal   Collection Time    05/01/13  4:53 AM      Result Value Range   WBC 5.8  4.0 - 10.5 K/uL   RBC 3.40 (*) 4.22 - 5.81 MIL/uL   Hemoglobin 10.5 (*) 13.0 - 17.0 g/dL   HCT 04.5 (*) 40.9 - 81.1 %   MCV 91.2  78.0 - 100.0 fL   MCH 30.9  26.0 - 34.0 pg   MCHC 33.9  30.0 - 36.0 g/dL  RDW 13.1  11.5 - 15.5 %   Platelets 94 (*) 150 - 400 K/uL   Comment: REPEATED TO VERIFY     SPECIMEN CHECKED FOR CLOTS     CONSISTENT WITH PREVIOUS RESULT  MAGNESIUM     Status: None   Collection Time    05/01/13  5:00 AM      Result Value Range   Magnesium 1.8  1.5 - 2.5 mg/dL    Dg Ribs Bilateral  12/05/1476   *RADIOLOGY REPORT*  Clinical Data: Fall.  Bilateral chest wall pain.  BILATERAL RIBS - 3+ VIEW  Comparison: 04/29/2013  Findings: A minimally-displaced right anterior sixth rib fracture is seen which appears acute. No acute left-sided  rib fractures are identified.  A subacute healing nondisplaced rib fractures are seen involving the right lateral eighth and ninth ribs, and the posterior 11th rib.  There is also an incompletely healed left posterior 11th rib fracture which shows some callus formation.  No evidence of pneumothorax or hemothorax.  Atelectasis is noted in the right lower lung zone.  IMPRESSION:  1.  Acute mildly displaced right anterior sixth rib fracture. 2.  Other bilateral subacute, healing rib fractures, as described above.   Original Report Authenticated By: Myles Rosenthal, M.D.    ROS  Blood pressure 150/88, pulse 90, temperature 97.8 F (36.6 C), temperature source Oral, resp. rate 20, height 5\' 11"  (1.803 m), weight 92.4 kg (203 lb 11.3 oz), SpO2 97.00%. Physical Exam     Status Examination/Evaluation:  Appearance: on bed   Eye Contact:: poor   Speech: slurred  Volume: Normal   Mood: depressed   Affect: ristricted   Thought Process: confused at times  Orientation: Full   Thought Content: NO AVH   Suicidal Thoughts: No   Homicidal Thoughts: no   Memory: Recent; Poor   Judgement: Impaired   Insight: Lacking   Psychomotor Activity: Normal   Concentration: Fair   Recall: Fair   Akathisia: No   Assessment:  AXIS I: alcohol dep,  AXIS II: Deferred  AXIS III: see emdical hx  ?   ?  ?   ?  ?  ?   ?  ?  ?   ?  ?  ?   ?  ?  ?   AXIS IV: problems related to social environment, problems with access to health care services and problems with primary support group  AXIS V: 40 ?    Treatment Plan/Recommendations:    1.will recommend drug rehab after detox/medical clearance. Psy SW can help with that   2. Will sign off. plz call if needed again.   Wonda Cerise 05/01/2013, 9:41 PM

## 2013-05-02 ENCOUNTER — Encounter (HOSPITAL_COMMUNITY): Payer: Self-pay | Admitting: Nurse Practitioner

## 2013-05-02 DIAGNOSIS — D649 Anemia, unspecified: Secondary | ICD-10-CM

## 2013-05-02 DIAGNOSIS — R933 Abnormal findings on diagnostic imaging of other parts of digestive tract: Secondary | ICD-10-CM

## 2013-05-02 DIAGNOSIS — R748 Abnormal levels of other serum enzymes: Secondary | ICD-10-CM

## 2013-05-02 LAB — CBC
HCT: 36.2 % — ABNORMAL LOW (ref 39.0–52.0)
MCV: 91.2 fL (ref 78.0–100.0)
RBC: 3.97 MIL/uL — ABNORMAL LOW (ref 4.22–5.81)
WBC: 6 10*3/uL (ref 4.0–10.5)

## 2013-05-02 LAB — RETICULOCYTES
RBC.: 3.97 MIL/uL — ABNORMAL LOW (ref 4.22–5.81)
Retic Ct Pct: 1.5 % (ref 0.4–3.1)

## 2013-05-02 LAB — AMMONIA: Ammonia: 23 umol/L (ref 11–60)

## 2013-05-02 LAB — BASIC METABOLIC PANEL
BUN: 7 mg/dL (ref 6–23)
CO2: 26 mEq/L (ref 19–32)
Chloride: 98 mEq/L (ref 96–112)
Creatinine, Ser: 0.59 mg/dL (ref 0.50–1.35)

## 2013-05-02 LAB — HEPATIC FUNCTION PANEL
Albumin: 3.5 g/dL (ref 3.5–5.2)
Bilirubin, Direct: 0.1 mg/dL (ref 0.0–0.3)
Total Bilirubin: 0.3 mg/dL (ref 0.3–1.2)

## 2013-05-02 LAB — IRON AND TIBC: Saturation Ratios: 11 % — ABNORMAL LOW (ref 20–55)

## 2013-05-02 LAB — VITAMIN B12: Vitamin B-12: 614 pg/mL (ref 211–911)

## 2013-05-02 MED ORDER — LORAZEPAM 2 MG/ML IJ SOLN
1.0000 mg | Freq: Once | INTRAMUSCULAR | Status: AC
  Administered 2013-05-02: 1 mg via INTRAVENOUS

## 2013-05-02 MED ORDER — POTASSIUM CHLORIDE CRYS ER 20 MEQ PO TBCR
40.0000 meq | EXTENDED_RELEASE_TABLET | Freq: Once | ORAL | Status: AC
  Administered 2013-05-02: 40 meq via ORAL
  Filled 2013-05-02: qty 2

## 2013-05-02 NOTE — Progress Notes (Signed)
This shift attending notified of increase to pt's diastolic BP X2. No new orders given d/t morning BP meds. Will continue to monitor pt. Information relayed to oncoming RN.

## 2013-05-02 NOTE — Progress Notes (Signed)
Pt family wanted to let us know that his mother had a history of dm, and his grandmother had colon ca.

## 2013-05-02 NOTE — Evaluation (Signed)
Occupational Therapy Evaluation Patient Details Name: [facility] MRN: [id number] DOB: Mar 20, 1961 Today's Date: 05/02/2013 Time: 1324- 1348    OT Assessment / Plan / Recommendation Clinical Impression  Pt presents to OT s/p fall with ETOH. Pt I with ADL activity. No further OT needed    OT Assessment  Patient does not need any further OT services    Follow Up Recommendations  No OT follow up                       ADL  Grooming: Performed;Wash/dry hands;Independent Where Assessed - Grooming: Unsupported standing Lower Body Dressing: Performed;Independent Toilet Transfer: Performed;Independent Toilet Transfer Method: Sit to Barista: Comfort height toilet Toileting - Architect and Hygiene: Independent Where Assessed - Engineer, mining and Hygiene: Standing Tub/Shower Transfer: Simulated;Independent Tub/Shower Transfer Method: Ambulating Transfers/Ambulation Related to ADLs: Pt I with ADL activity. No further OT needed at this time          Visit Information  Last OT Received On: 05/02/13    Subjective Data  Subjective: I live on the 2 nd level of a building -but it is 3 flights of stairs   Prior Functioning     Home Living Lives With: Alone;Family Type of Home: Other (Comment) (extended stay) Home Access: Stairs to enter Home Layout: One level Bathroom Shower/Tub: Engineer, manufacturing systems: Standard Prior Function Level of Independence: Independent Able to Take Stairs?: Yes Vocation: Unemployed Communication Communication: No difficulties         Vision/Perception Vision - History Patient Visual Report: No change from baseline   Cognition  Cognition Arousal/Alertness: Awake/alert Behavior During Therapy: WFL for tasks assessed/performed Overall Cognitive Status: Within Functional Limits for tasks assessed    Extremity/Trunk Assessment Right Upper Extremity Assessment RUE  ROM/Strength/Tone: Within functional levels Left Upper Extremity Assessment LUE ROM/Strength/Tone: Within functional levels     Mobility Bed Mobility Bed Mobility: Not assessed Transfers Transfers: Sit to Stand;Stand to Sit Sit to Stand: With armrests;From chair/3-in-1;7: Independent Stand to Sit: To chair/3-in-1;With upper extremity assist;7: Independent           End of Session OT - End of Session Activity Tolerance: Patient tolerated treatment well Patient left: in chair  GO     Emmalise Huard, Metro Kung 05/02/2013, 1:48 PM

## 2013-05-02 NOTE — Evaluation (Signed)
Physical Therapy Evaluation Patient Details Name: Jeffrey [facility] MRN: [id number] DOB: 10-Jun-1961 Today's Date: 05/02/2013 Time: 1448-1500 PT Time Calculation (min): 12 min  PT Assessment / Plan / Recommendation Clinical Impression  Pt is a 52 year old male admitted for mechanical fall and alcohol intoxication.  Pt staying at extended stay hotel with 3 flights of stairs to get to room.  Pt ambulated in hallway with no unsteady gait or LOB and performed 3 flights of stairs with short rest break in the middle.  Pt doing well with mobility and no PT needs identified at this time.    PT Assessment  Patent does not need any further PT services    Follow Up Recommendations  No PT follow up    Does the patient have the potential to tolerate intense rehabilitation      Barriers to Discharge        Equipment Recommendations  None recommended by PT    Recommendations for Other Services     Frequency      Precautions / Restrictions     Pertinent Vitals/Pain n/a      Mobility  Bed Mobility Bed Mobility: Supine to Sit Supine to Sit: 7: Independent Transfers Transfers: Sit to Stand;Stand to Sit Sit to Stand: 7: Independent;From bed Stand to Sit: 7: Independent;To chair/3-in-1;With upper extremity assist Ambulation/Gait Ambulation/Gait Assistance: 7: Independent Ambulation Distance (Feet): 400 Feet Assistive device: None Ambulation/Gait Assistance Details: steady gait, no LOB, WFL Gait Pattern: Within Functional Limits Stairs: Yes Stairs Assistance: 5: Supervision Stairs Assistance Details (indicate cue type and reason): occasional dragging of feet however no LOB and used rail for balance Stair Management Technique: One rail Left;Alternating pattern;Forwards Number of Stairs:  (3 full flights)    Exercises     PT Diagnosis:    PT Problem List:   PT Treatment Interventions:     PT Goals    Visit Information  Last PT Received On: 05/02/13 Assistance Needed: +1     Subjective Data  Subjective: I just ate so I'm really full but I'd love to get out of this room.   Prior Functioning  Home Living Lives With: Alone;Family Type of Home: Other (Comment) (extended stay) Home Access: Stairs to enter Home Layout: One level Bathroom Shower/Tub: Engineer, manufacturing systems: Standard Additional Comments: Pt states he is staying at extended stay hotel and has 3 flights of stairs with rails on both sides Prior Function Level of Independence: Independent Able to Take Stairs?: Yes Vocation: Unemployed Communication Communication: No difficulties    Cognition  Cognition Arousal/Alertness: Awake/alert Behavior During Therapy: WFL for tasks assessed/performed Overall Cognitive Status: Within Functional Limits for tasks assessed    Extremity/Trunk Assessment Right Upper Extremity Assessment RUE ROM/Strength/Tone: Within functional levels Left Upper Extremity Assessment LUE ROM/Strength/Tone: Within functional levels Right Lower Extremity Assessment RLE ROM/Strength/Tone: Within functional levels Left Lower Extremity Assessment LLE ROM/Strength/Tone: Within functional levels Trunk Assessment Trunk Assessment: Normal   Balance    End of Session PT - End of Session Activity Tolerance: Patient tolerated treatment well Patient left: in chair;with call bell/phone within reach;with nursing in room  GP     Halden Phegley,KATHrine E 05/02/2013, 3:14 PM Zenovia Jarred, PT, DPT 05/02/2013 Pager: [phone]

## 2013-05-02 NOTE — Consult Note (Signed)
Reason for Consult: alcohol use Referring Physician: unknown.  Jeffrey Black is an 52 y.o. male.  HPI: Patient is known from previous my to the [facility] long hospitalizations for similar problems. He is a 52 year old male with a history of chronic alcohol dependence, withdrawal, white behaviors with alcohol intoxication as well as psoriasis. The patient states that he has been drinking three quarters of a gallon of alcohol on a daily basis. The patient has been staying at [facility]. The patient was recently discharged from [facility] on 04/20/2013 after a recent stay for suicidal ideation and does not want to be readmitted to be here at the hospital saying he learned coping skills discussed in the group meetings. Patient was in rehab few months ago at [facility], and they see with medication management from [facility] and was sober for 3 weeks then he started drinking.   Interval Hx: He is calm and cooperative. Patient insisted he wants to get into outpatient substance abuse treatment with the active participation alcoholic anonymous. He also blames his family nonsupporting his needs. Reportedly his father visited him which made him happy and satisfied. Patient denies current symptoms of depression mania psychosis suicidal and homicidal ideation intentions or plans. Patient has no reported withdrawal symptoms. Patient has poor insight judgment and impulse components in regarding to alcohol abuse versus dependence.   Past Medical History  Diagnosis Date  . ETOH abuse   . Psoriasis   . Cancer     Mother and sister unknown type of cancer    Past Surgical History  Procedure Laterality Date  . Left 4th finger surgery      Family History  Problem Relation Age of Onset  . Stroke Father   . Diabetes type II Mother   . Cancer - Colon Other     Social History:  reports that he has been smoking Cigarettes.  He has a 20 pack-year smoking history. He has never used smokeless tobacco. He reports  that  drinks alcohol. He reports that he does not use illicit drugs.  Allergies: No Known Allergies  Medications: I have reviewed the patient's current medications.  Results for orders placed during the hospital encounter of 04/29/13 (from the past 48 hour(s))  URINE RAPID DRUG SCREEN (HOSP PERFORMED)     Status: Abnormal   Collection Time    04/30/13  8:55 PM      Result Value Range   Opiates POSITIVE (*) NONE DETECTED   Cocaine NONE DETECTED  NONE DETECTED   Benzodiazepines POSITIVE (*) NONE DETECTED   Amphetamines NONE DETECTED  NONE DETECTED   Tetrahydrocannabinol NONE DETECTED  NONE DETECTED   Barbiturates NONE DETECTED  NONE DETECTED   Comment:            DRUG SCREEN FOR MEDICAL PURPOSES     ONLY.  IF CONFIRMATION IS NEEDED     FOR ANY PURPOSE, NOTIFY LAB     WITHIN 5 DAYS.                LOWEST DETECTABLE LIMITS     FOR URINE DRUG SCREEN     Drug Class       Cutoff (ng/mL)     Amphetamine      1000     Barbiturate      200     Benzodiazepine   200     Tricyclics       300     Opiates  300     Cocaine          300     THC              50  URINALYSIS W MICROSCOPIC + REFLEX CULTURE     Status: Abnormal   Collection Time    04/30/13  8:55 PM      Result Value Range   Color, Urine YELLOW  YELLOW   APPearance CLEAR  CLEAR   Specific Gravity, Urine 1.015  1.005 - 1.030   pH 6.5  5.0 - 8.0   Glucose, UA NEGATIVE  NEGATIVE mg/dL   Hgb urine dipstick NEGATIVE  NEGATIVE   Bilirubin Urine NEGATIVE  NEGATIVE   Ketones, ur NEGATIVE  NEGATIVE mg/dL   Protein, ur NEGATIVE  NEGATIVE mg/dL   Urobilinogen, UA 0.2  0.0 - 1.0 mg/dL   Nitrite NEGATIVE  NEGATIVE   Leukocytes, UA NEGATIVE  NEGATIVE   WBC, UA 0-2  <3 WBC/hpf   RBC / HPF 0-2  <3 RBC/hpf   Bacteria, UA FEW (*) RARE   Squamous Epithelial / LPF RARE  RARE  BASIC METABOLIC PANEL     Status: Abnormal   Collection Time    05/01/13  4:53 AM      Result Value Range   Sodium 135  135 - 145 mEq/L   Potassium 2.9  (*) 3.5 - 5.1 mEq/L   Comment: DELTA CHECK NOTED     REPEATED TO VERIFY   Chloride 99  96 - 112 mEq/L   CO2 28  19 - 32 mEq/L   Glucose, Bld 110 (*) 70 - 99 mg/dL   BUN 9  6 - 23 mg/dL   Creatinine, Ser 1.61  0.50 - 1.35 mg/dL   Calcium 8.7  8.4 - 09.6 mg/dL   GFR calc non Af Amer >90  >90 mL/min   GFR calc Af Amer >90  >90 mL/min   Comment:            The eGFR has been calculated     using the CKD EPI equation.     This calculation has not been     validated in all clinical     situations.     eGFR's persistently     <90 mL/min signify     possible Chronic Kidney Disease.  CBC     Status: Abnormal   Collection Time    05/01/13  4:53 AM      Result Value Range   WBC 5.8  4.0 - 10.5 K/uL   RBC 3.40 (*) 4.22 - 5.81 MIL/uL   Hemoglobin 10.5 (*) 13.0 - 17.0 g/dL   HCT 04.5 (*) 40.9 - 81.1 %   MCV 91.2  78.0 - 100.0 fL   MCH 30.9  26.0 - 34.0 pg   MCHC 33.9  30.0 - 36.0 g/dL   RDW 91.4  78.2 - 95.6 %   Platelets 94 (*) 150 - 400 K/uL   Comment: REPEATED TO VERIFY     SPECIMEN CHECKED FOR CLOTS     CONSISTENT WITH PREVIOUS RESULT  MAGNESIUM     Status: None   Collection Time    05/01/13  5:00 AM      Result Value Range   Magnesium 1.8  1.5 - 2.5 mg/dL  OCCULT BLOOD X 1 CARD TO LAB, STOOL     Status: Abnormal   Collection Time    05/01/13  7:46 PM      Result  Value Range   Fecal Occult Bld POSITIVE (*) NEGATIVE  CBC     Status: Abnormal   Collection Time    05/02/13  4:45 AM      Result Value Range   WBC 6.0  4.0 - 10.5 K/uL   RBC 3.97 (*) 4.22 - 5.81 MIL/uL   Hemoglobin 11.9 (*) 13.0 - 17.0 g/dL   HCT 16.1 (*) 09.6 - 04.5 %   MCV 91.2  78.0 - 100.0 fL   MCH 30.0  26.0 - 34.0 pg   MCHC 32.9  30.0 - 36.0 g/dL   RDW 40.9  81.1 - 91.4 %   Platelets 91 (*) 150 - 400 K/uL   Comment: CONSISTENT WITH PREVIOUS RESULT  BASIC METABOLIC PANEL     Status: Abnormal   Collection Time    05/02/13  4:45 AM      Result Value Range   Sodium 135  135 - 145 mEq/L   Potassium 3.5   3.5 - 5.1 mEq/L   Comment: RESULT REPEATED AND VERIFIED     DELTA CHECK NOTED   Chloride 98  96 - 112 mEq/L   CO2 26  19 - 32 mEq/L   Glucose, Bld 102 (*) 70 - 99 mg/dL   BUN 7  6 - 23 mg/dL   Creatinine, Ser 7.82  0.50 - 1.35 mg/dL   Calcium 9.6  8.4 - 95.6 mg/dL   GFR calc non Af Amer >90  >90 mL/min   GFR calc Af Amer >90  >90 mL/min   Comment:            The eGFR has been calculated     using the CKD EPI equation.     This calculation has not been     validated in all clinical     situations.     eGFR's persistently     <90 mL/min signify     possible Chronic Kidney Disease.  VITAMIN B12     Status: None   Collection Time    05/02/13  4:45 AM      Result Value Range   Vitamin B-12 614  211 - 911 pg/mL  FOLATE     Status: None   Collection Time    05/02/13  4:45 AM      Result Value Range   Folate 17.4     Comment: (NOTE)     Reference Ranges            Deficient:       0.4 - 3.3 ng/mL            Indeterminate:   3.4 - 5.4 ng/mL            Normal:              > 5.4 ng/mL  IRON AND TIBC     Status: Abnormal   Collection Time    05/02/13  4:45 AM      Result Value Range   Iron 33 (*) 42 - 135 ug/dL   TIBC 213  086 - 578 ug/dL   Saturation Ratios 11 (*) 20 - 55 %   UIBC 254  125 - 400 ug/dL  FERRITIN     Status: Abnormal   Collection Time    05/02/13  4:45 AM      Result Value Range   Ferritin 525 (*) 22 - 322 ng/mL  RETICULOCYTES     Status: Abnormal   Collection Time  05/02/13  4:45 AM      Result Value Range   Retic Ct Pct 1.5  0.4 - 3.1 %   RBC. 3.97 (*) 4.22 - 5.81 MIL/uL   Retic Count, Manual 59.6  19.0 - 186.0 K/uL  HEPATIC FUNCTION PANEL     Status: Abnormal   Collection Time    05/02/13  8:40 AM      Result Value Range   Total Protein 6.9  6.0 - 8.3 g/dL   Albumin 3.5  3.5 - 5.2 g/dL   AST 79 (*) 0 - 37 U/L   ALT 63 (*) 0 - 53 U/L   Alkaline Phosphatase 94  39 - 117 U/L   Total Bilirubin 0.3  0.3 - 1.2 mg/dL   Bilirubin, Direct 0.1  0.0 -  0.3 mg/dL   Indirect Bilirubin 0.2 (*) 0.3 - 0.9 mg/dL  AMMONIA     Status: None   Collection Time    05/02/13  8:40 AM      Result Value Range   Ammonia 23  11 - 60 umol/L    No results found.  ROS  Blood pressure 152/90, pulse 91, temperature 97.4 F (36.3 C), temperature source Axillary, resp. rate 18, height 5\' 11"  (1.803 m), weight 92.4 kg (203 lb 11.3 oz), SpO2 98.00%. Physical Exam     Status Examination/Evaluation:  Appearance: on bed   Eye Contact:: poor   Speech: slurred  Volume: Normal   Mood: depressed   Affect: ristricted   Thought Process: confused at times  Orientation: Full   Thought Content: NO AVH   Suicidal Thoughts: No   Homicidal Thoughts: no   Memory: Recent; Poor   Judgement: Impaired   Insight: Lacking   Psychomotor Activity: Normal   Concentration: Fair   Recall: Fair   Akathisia: No   Assessment:  AXIS I: alcohol dep,  AXIS II: Deferred  AXIS III: see emdical hx  ?   ?  ?   ?  ?  ?   ?  ?  ?   ?  ?  ?   ?  ?  ?   AXIS IV: problems related to social environment, problems with access to health care services and problems with primary support group  AXIS V: 40 ?   Treatment Plan/Recommendations:   1. Patient does not meet criteria for acute psychiatric hospitalization  2. Patient will participate outpatient substance abuse treatment  3. [facility]  4. Patient benefit from drug rehab after detox/medical clearance but refuses cannot be commented.  5. psychiatric consultation services will sign off.   Tu Shimmel,JANARDHAHA R. 05/02/2013, 5:03 PM

## 2013-05-02 NOTE — Progress Notes (Signed)
Clinical Social Work Department CLINICAL SOCIAL WORK PSYCHIATRY SERVICE LINE ASSESSMENT 05/02/2013  Patient:  Jeffrey Black  Account:  [id number]  Admit Date:  04/29/2013  Clinical Social Worker:  Unk Lightning, LCSW  Date/Time:  05/02/2013 12:00 N Referred by:  Physician  Date referred:  05/02/2013 Reason for Referral  Substance Abuse   Presenting Symptoms/Problems (In the person's/family's own words):   Psych consulted for substance abuse and depression   Abuse/Neglect/Trauma History (check all that apply)  Denies history   Abuse/Neglect/Trauma Comments:   Psychiatric History (check all that apply)  Outpatient treatment  Residential treatment  Inpatient/hospitilization   Psychiatric medications:  Lexapro 10 mg  Ativan 1 mg   Current Mental Health Hospitalizations/Previous Mental Health History:   Patient reports he attends daily AA meetings. Patient has been to several inpatient substance abuse treatment facilities. Patient recently DC from [facility].   Current provider:   None   Place and Date:   N/A   Current Medications:   acetaminophen, acetaminophen, calcium carbonate, HYDROcodone-acetaminophen, LORazepam, LORazepam, LORazepam, LORazepam, morphine injection, ondansetron (ZOFRAN) IV, ondansetron            . escitalopram  10 mg Oral Daily  . folic acid  1 mg Oral Daily  . LORazepam  0-4 mg Oral Q12H  . magnesium oxide  200 mg Oral BID  . metoprolol tartrate  12.5 mg Oral BID  . multivitamin with minerals  1 tablet Oral Daily  . nicotine  14 mg Transdermal Daily  . pantoprazole  40 mg Oral BID  . QUEtiapine  100 mg Oral QHS  . sodium chloride  3 mL Intravenous Q12H  . thiamine  100 mg Oral Daily   Or     . thiamine  100 mg Intravenous Daily   Previous Impatient Admission/Date/Reason:   Patient has been to [facility] and [facility]. Patient had recent stay at [facility].   Emotional Health / Current Symptoms    Suicide/Self Harm  None reported   Suicide  attempt in the past:   Patient denies any current SI or HI.   Other harmful behavior:   Patient has chronic substance abuse issues which causes multiple hospitalizations.   Psychotic/Dissociative Symptoms  Auditory Hallucinations  Visual Hallucinations   Other Psychotic/Dissociative Symptoms:   RN reports that patient was hallucinating yesterday. Patient denies any hallucinations and reports he was having "vivid dreams" yesterday but denies any dreams or AH/VH today.    Attention/Behavioral Symptoms  Within Normal Limits   Other Attention / Behavioral Symptoms:   Patient engaged throughout assessment.    Cognitive Impairment  Within Normal Limits   Other Cognitive Impairment:    Mood and Adjustment  Flat    Stress, Anxiety, Trauma, Any Recent Loss/Stressor  Relationship   Anxiety (frequency):   N/A   Phobia (specify):   N/A   Compulsive behavior (specify):   N/A   Obsessive behavior (specify):   N/A   Other:   Patient reports that strained relationship with family often triggers him to drink alcohol.   Substance Abuse/Use  Current substance use  Substance abuse treatment needed   SBIRT completed (please refer for detailed history):  Y  Self-reported substance use:   Patient reports that he continues to go to [employer] but that he drank alcohol this week. Patient reports that he has been drinking a few days out of the week and has been consuming Everclear. Patient completed SBIRT score and continues to have a high score. CSW encouraged  inpatient treatment but patient continues to refuse inpatient treatment.   Urinary Drug Screen Completed:  Y Alcohol level:   456    Environmental/Housing/Living Arrangement  HOMELESS   Who is in the home:   Patient is currently homeless but has money saved in order to stay at an extended stay hotel.   Emergency contact:  Chief Strategy Officer   Patient's Strengths and Goals (patient's own  words):   Psych consulted to evaluate patient for substance abuse.   Clinical Social Worker's Interpretive Summary:   CSW received referral to complete psychosocial assessment. CSW is familiar with patient due to recent hospitalizations for alcohol use. Patient remembers CSW as well and agreeable to assessment.    Patient reports he was drunk at his hotel and someone called 911. Patient reports that he did not enjoy living at half way house but has not chosen an apartment yet. Patient reports he is going to find a roommate at AA so that his bills are cheaper. Patient reports that his savings are almost out and that he needs a job in order to pay his bills. CSW and patient discussed that patient needs to remain sober in order to find a job.    Patient reports that he has his drinking problem under control. CSW and patient discussed high SBIRT score but patient continues to refuse inpatient treatment. Patient reports that going to [facility] was not helpful and that he has been to plenty facilities in the past. Patient agreeable to intensive outpatient and has information for services.    Patient denies any SI or HI. Patient refusing inpatient treatment at this time. Patient is open to discussing substance abuse but is not realistic in completing a plan to remain sober. Patient minimizes effects of alcohol use and wants to return to hotel at DC.    CSW will await psych MD to reevaluate patient again and follow any recommendations provided.   Disposition:  Recommend Psych CSW continuing to support while in hospital

## 2013-05-02 NOTE — Progress Notes (Signed)
[facility] PROGRESS NOTE  Jeffrey Black EAV:[id number] DOB: 1961-04-26 DOA: 04/29/2013 PCP: No primary provider on file.  Assessment/Plan:  Alcohol intoxication with early signs of withdrawal  -Continue alcohol withdrawal protocol  -Continue thiamine, folic acid, multivitamin  -Urine drug screen negative for cocaine.  -Patient need  inpatient alcohol rehab..  -Relates hallucination, I have consulted psych again.  -Nice think he looks different, with slow respond. This could be effect from medications, <orphine, ativan, alcohol withdrawal. I will check LFT, ammonia level.   Anemia/ Melena: Hb stable.  guaiac stool positive. Hold Lovenox, start scd.  He relates black stool. I have consulted GI.   Hypokalemia: resolved. Will give one dose K-CL.   Tachycardia/SVT -Improved.  -Has been a chronic problem. -04/15/13 TSH 1.202  -Continue with metoprolol.  -Replete K.   Abdominal pain with vomiting  -Resolved.  -Likely due to alcoholic gastritis  -Continue PPI, change to po. -Continue IV fluids  -Lipase negative.   Tobacco use  -Smokes 1-1/2 packs per day x30 years  -Nicoderm patch   Chest pain  -Secondary to Rib fracture.  -Morphine PRN, Start Vicodin. - troponin times 2 negative.  -Incentive spirometry.  -Will try oral medications.   Metabolic acidosis  -Resolved.  -Likely due to alcoholic ketosis  - lactic acid at 4. Repeated at 1.  -Continue IV fluids   Fall:secondary to alcohol intoxication. CT head negative.  Thrombocytopenia: Probably Secondary to BM supresion from alcohol. Continue to monitor.   Code Status: Full Code.  Family Communication: Care discussed with patient.  Disposition Plan: Remain inpatient.    Consultants:  Psych  Procedures:  none  Antibiotics:  none  HPI/Subjective: Patient oriented to person, place and situation. He relates balck stool this morning. He denies abdominal pain. Patient relates that he is having  hallucination, he heard his 81. He saw two man in the room. The hallucination are less frequents. .    Objective: Filed Vitals:   05/02/13 0345 05/02/13 0411 05/02/13 0415 05/02/13 0600  BP: 165/118 153/118 166/117 151/92  Pulse: 85 72 99 90  Temp: 97.7 F (36.5 C)  97.9 F (36.6 C) 98 F (36.7 C)  TempSrc: Oral  Oral Oral  Resp: 18  18 18   Height:      Weight:      SpO2: 97%  98% 97%   No intake or output data in the 24 hours ending 05/02/13 0839 Filed Weights   04/29/13 1945  Weight: 92.4 kg (203 lb 11.3 oz)    Exam:   General: awake in no distress,   Cardiovascular: S 1, S 2, RRR  Respiratory: CTA  Abdomen: Bs present, soft, NT  Musculoskeletal: no edema.   Neuro exam non focal.   Data Reviewed: Basic Metabolic Panel:  Recent Labs Lab 04/29/13 0848 04/29/13 2016 04/30/13 0210 05/01/13 0453 05/01/13 0500 05/02/13 0445  NA 139  --  134* 135  --  135  K 3.5  --  3.5 2.9*  --  3.5  CL 94*  --  97 99  --  98  CO2 19  --  21 28  --  26  GLUCOSE 81  --  94 110*  --  102*  BUN 15  --  13 9  --  7  CREATININE 0.65  --  0.55 0.58  --  0.59  CALCIUM 8.7  --  8.6 8.7  --  9.6  MG  --  1.8  --   --  1.8  --  Liver Function Tests:  Recent Labs Lab 04/29/13 0848  AST 43*  ALT 32  ALKPHOS 96  BILITOT 0.5  PROT 7.6  ALBUMIN 3.7    Recent Labs Lab 04/29/13 2016  LIPASE 32   No results found for this basename: AMMONIA,  in the last 168 hours CBC:  Recent Labs Lab 04/29/13 0848 04/30/13 0210 04/30/13 0846 05/01/13 0453 05/02/13 0445  WBC 7.2 5.4 6.1 5.8 6.0  HGB 14.7 10.5* 10.5* 10.5* 11.9*  HCT 42.7 31.1* 30.8* 31.0* 36.2*  MCV 91.2 90.9 91.4 91.2 91.2  PLT 186 108* 109* 94* 91*   Cardiac Enzymes:  Recent Labs Lab 04/29/13 2016 04/30/13 0210 04/30/13 0846  TROPONINI <0.30 <0.30 <0.30   BNP (last 3 results)  Recent Labs  11/08/12 0540  PROBNP 34.6   CBG: No results found for this basename: GLUCAP,  in the last 168  hours  Recent Results (from the past 240 hour(s))  MRSA PCR SCREENING     Status: None   Collection Time    04/29/13  8:07 PM      Result Value Range Status   MRSA by PCR NEGATIVE  NEGATIVE Final   Comment:            The GeneXpert MRSA Assay (FDA     approved for NASAL specimens     only), is one component of a     comprehensive MRSA colonization     surveillance program. It is not     intended to diagnose MRSA     infection nor to guide or     monitor treatment for     MRSA infections.     Studies: Dg Ribs Bilateral  04/30/2013   *RADIOLOGY REPORT*  Clinical Data: Fall.  Bilateral chest wall pain.  BILATERAL RIBS - 3+ VIEW  Comparison: 04/29/2013  Findings: A minimally-displaced right anterior sixth rib fracture is seen which appears acute. No acute left-sided rib fractures are identified.  A subacute healing nondisplaced rib fractures are seen involving the right lateral eighth and ninth ribs, and the posterior 11th rib.  There is also an incompletely healed left posterior 11th rib fracture which shows some callus formation.  No evidence of pneumothorax or hemothorax.  Atelectasis is noted in the right lower lung zone.  IMPRESSION:  1.  Acute mildly displaced right anterior sixth rib fracture. 2.  Other bilateral subacute, healing rib fractures, as described above.   Original Report Authenticated By: Myles Rosenthal, M.D.    Scheduled Meds: . escitalopram  10 mg Oral Daily  . folic acid  1 mg Oral Daily  . LORazepam  0-4 mg Oral Q12H  . magnesium oxide  200 mg Oral BID  . metoprolol tartrate  12.5 mg Oral BID  . multivitamin with minerals  1 tablet Oral Daily  . nicotine  14 mg Transdermal Daily  . pantoprazole  40 mg Oral BID  . potassium chloride  40 mEq Oral Once  . QUEtiapine  100 mg Oral QHS  . sodium chloride  3 mL Intravenous Q12H  . thiamine  100 mg Oral Daily   Or  . thiamine  100 mg Intravenous Daily   Continuous Infusions: . sodium chloride 100 mL/hr at 05/01/13  0527  . sodium chloride 0.9 % 1,000 mL with potassium chloride 20 mEq infusion 100 mL/hr at 04/30/13 4696    Active Problems:   Alcohol withdrawal syndrome   Elevated liver enzymes   Depression   Alcohol intoxication  Alcohol dependence   Tachycardia   Anemia   Metabolic acidosis    Time spent: 35 minutes.     Jeffrey Black  [facility] Pager [phone]. If 7PM-7AM, please contact night-coverage at [email or web address], password [facility] 05/02/2013, 8:39 AM  LOS: 3 days

## 2013-05-02 NOTE — Consult Note (Signed)
[facility] Consultation  Referring Provider:  [facility] Primary Care Physician:  No primary provider on file. Primary Gastroenterologist: none         Reason for Consultation:   GI bleed         HPI:   Jeffrey Black is a 52 y.o. male discharged from the hospital 04/20/13 after being admitted with alcohol intoxication / suicidal ideation.  Patient now readmitted with alcohol intoxication, nausea, and vomiting.  We were asked to see him for black stools. Patient gives a history of chronic intermittent black stools associated with binge drinking. No hematemesis but he does have intermittent nausea and vomiting with binges. He takes NSAIDS on a daily basis. He complains of upper abdominal discomfort for which he takes TUMS. Prilosec on home med list patient patient hasn't taken it in a very long time.   Past Medical History  Diagnosis Date  . ETOH abuse   . Psoriasis     Past Surgical History  Procedure Laterality Date  . Left 4th finger surgery      Family History  Problem Relation Age of Onset  . Stroke Father      History  Substance Use Topics  . Smoking status: Current Every Day Smoker -- 1.00 packs/day for 20 years    Types: Cigarettes  . Smokeless tobacco: Never Used  . Alcohol Use: Yes     Comment: drinks 1 pt per day    Prior to Admission medications   Medication Sig Start Date End Date Taking? Authorizing Provider  escitalopram (LEXAPRO) 10 MG tablet Take 1 tablet (10 mg total) by mouth daily. For depression 04/20/13   Agnes I Nwoko, NP  gabapentin (NEURONTIN) 300 MG capsule Take 1 capsule (300 mg total) by mouth 3 (three) times daily. For anxiety/pain control 04/20/13   Agnes I Nwoko, NP  metoprolol tartrate (LOPRESSOR) 25 MG tablet Take 0.5 tablets (12.5 mg total) by mouth 2 (two) times daily. For high blood pressure control 04/23/13   John-Adam Bonk, MD  omeprazole (PRILOSEC) 20 MG capsule Take 1 capsule (20 mg total) by mouth daily as needed (for acid  relux). For acid reflux 04/20/13   Agnes I Nwoko, NP  QUEtiapine (SEROQUEL) 100 MG tablet Take 1 tablet (100 mg total) by mouth at bedtime. For mood control 04/20/13   Agnes I Nwoko, NP    Current Facility-Administered Medications  Medication Dose Route Frequency Provider Last Rate Last Dose  . 0.9 %  sodium chloride infusion   Intravenous Continuous Belkys A Regalado, MD 100 mL/hr at 05/01/13 0527    . acetaminophen (TYLENOL) tablet 650 mg  650 mg Oral Q6H PRN David Tat, MD       Or  . acetaminophen (TYLENOL) suppository 650 mg  650 mg Rectal Q6H PRN David Tat, MD      . calcium carbonate (TUMS - dosed in mg elemental calcium) chewable tablet 200 mg of elemental calcium  1 tablet Oral Q6H PRN Mary A Lynch, NP   200 mg of elemental calcium at 04/29/13 2245  . escitalopram (LEXAPRO) tablet 10 mg  10 mg Oral Daily David Tat, MD   10 mg at 05/01/13 0900  . folic acid (FOLVITE) tablet 1 mg  1 mg Oral Daily Kevin E Steinl, MD   1 mg at 05/01/13 0901  . HYDROcodone-acetaminophen (NORCO/VICODIN) 5-325 MG per tablet 1 tablet  1 tablet Oral Q6H PRN Belkys A Regalado, MD   1 tablet at 05/01/13 0757  . LORazepam (  ATIVAN) tablet 1 mg  1 mg Oral Q6H PRN Kevin E Steinl, MD   1 mg at 04/30/13 1249   Or  . LORazepam (ATIVAN) injection 1 mg  1 mg Intravenous Q6H PRN Kevin E Steinl, MD   1 mg at 04/29/13 1718  . LORazepam (ATIVAN) tablet 1 mg  1 mg Oral Q4H PRN David Tat, MD       Or  . LORazepam (ATIVAN) injection 1 mg  1 mg Intravenous Q4H PRN David Tat, MD   1 mg at 05/02/13 0658  . LORazepam (ATIVAN) tablet 0-4 mg  0-4 mg Oral Q12H Kevin E Steinl, MD   4 mg at 05/01/13 1557  . magnesium oxide (MAG-OX) tablet 200 mg  200 mg Oral BID Belkys A Regalado, MD   200 mg at 05/01/13 2156  . metoprolol tartrate (LOPRESSOR) tablet 12.5 mg  12.5 mg Oral BID David Tat, MD   12.5 mg at 05/01/13 2155  . morphine 2 MG/ML injection 2 mg  2 mg Intravenous Q3H PRN David Tat, MD   2 mg at 05/02/13 0255  . multivitamin with  minerals tablet 1 tablet  1 tablet Oral Daily Kevin E Steinl, MD   1 tablet at 05/01/13 0900  . nicotine (NICODERM CQ - dosed in mg/24 hours) patch 14 mg  14 mg Transdermal Daily David Tat, MD   14 mg at 05/01/13 1024  . ondansetron (ZOFRAN) tablet 4 mg  4 mg Oral Q6H PRN David Tat, MD       Or  . ondansetron (ZOFRAN) injection 4 mg  4 mg Intravenous Q6H PRN David Tat, MD   4 mg at 04/29/13 2025  . pantoprazole (PROTONIX) EC tablet 40 mg  40 mg Oral BID Belkys A Regalado, MD   40 mg at 05/01/13 2203  . potassium chloride SA (K-DUR,KLOR-CON) CR tablet 40 mEq  40 mEq Oral Once Belkys A Regalado, MD      . QUEtiapine (SEROQUEL) tablet 100 mg  100 mg Oral QHS David Tat, MD   100 mg at 05/01/13 2156  . sodium chloride 0.9 % 1,000 mL with potassium chloride 20 mEq infusion   Intravenous Continuous David Tat, MD 100 mL/hr at 04/30/13 0538    . sodium chloride 0.9 % injection 3 mL  3 mL Intravenous Q12H David Tat, MD   3 mL at 05/01/13 0901  . thiamine (VITAMIN B-1) tablet 100 mg  100 mg Oral Daily David Tat, MD   100 mg at 05/01/13 0900   Or  . thiamine (B-1) injection 100 mg  100 mg Intravenous Daily David Tat, MD        Allergies as of 04/29/2013  . (No Known Allergies)    Review of Systems:    Itching with psoriasis. All systems reviewed and negative except where noted in HPI.   Physical Exam:  Vital signs in last 24 hours: Temp:  [97.7 F (36.5 C)-98.1 F (36.7 C)] 98 F (36.7 C) (06/16 0600) Pulse Rate:  [72-99] 90 (06/16 0600) Resp:  [18-20] 18 (06/16 0600) BP: (138-166)/(72-118) 151/92 mmHg (06/16 0600) SpO2:  [97 %-98 %] 97 % (06/16 0600) Last BM Date: 05/02/13 General: male in NAD Head:  Normocephalic and atraumatic. Eyes:   No icterus.   Conjunctiva pink. Ears:  Normal auditory acuity. Neck:  Supple; no masses felt Lungs:  Respirations even and unlabored. Lungs clear to auscultation bilaterally.   No wheezes, crackles, or rhonchi.  Heart:  Regular rate and rhythm Abdomen:     Soft, nondistended, nontender. Normal bowel sounds. No appreciable masses or hepatomegaly.  Rectal:  Perianal erythema. Redness between gluteal folds. Yellowish stool in vault..  Msk:  Symmetrical without gross deformities.  Extremities:  Without edema. Neurologic:  Alert and  oriented x4;  grossly normal neurologically. Skin:  Several psoriatic skin rashes on extremities and ? Perianal area Cervical Nodes:  No significant cervical adenopathy. Psych:  Alert and cooperative. Normal affect.  LAB RESULTS:  Recent Labs  04/30/13 0846 05/01/13 0453 05/02/13 0445  WBC 6.1 5.8 6.0  HGB 10.5* 10.5* 11.9*  HCT 30.8* 31.0* 36.2*  PLT 109* 94* 91*   BMET  Recent Labs  04/30/13 0210 05/01/13 0453 05/02/13 0445  NA 134* 135 135  K 3.5 2.9* 3.5  CL 97 99 98  CO2 21 28 26  GLUCOSE 94 110* 102*  BUN 13 9 7  CREATININE 0.55 0.58 0.59  CALCIUM 8.6 8.7 9.6   LFT  Recent Labs  05/02/13 0840  PROT 6.9  ALBUMIN 3.5  AST 79*  ALT 63*  ALKPHOS 94  BILITOT 0.3  BILIDIR 0.1  IBILI 0.2*   PT/INR  Recent Labs  04/30/13 0210  LABPROT 13.1  INR 1.00    STUDIES: Dg Ribs Bilateral  04/30/2013   *RADIOLOGY REPORT*  Clinical Data: Fall.  Bilateral chest wall pain.  BILATERAL RIBS - 3+ VIEW  Comparison: 04/29/2013  Findings: A minimally-displaced right anterior sixth rib fracture is seen which appears acute. No acute left-sided rib fractures are identified.  A subacute healing nondisplaced rib fractures are seen involving the right lateral eighth and ninth ribs, and the posterior 11th rib.  There is also an incompletely healed left posterior 11th rib fracture which shows some callus formation.  No evidence of pneumothorax or hemothorax.  Atelectasis is noted in the right lower lung zone.  IMPRESSION:  1.  Acute mildly displaced right anterior sixth rib fracture. 2.  Other bilateral subacute, healing rib fractures, as described above.   Original Report Authenticated By: John Stahl, M.D.    PREVIOUS ENDOSCOPIES:            none    Impression / Plan:   1. 52 year old male with long history of ETOH abuse, admitted with acute intoxication.   2. Chronic, intermittent black stools associated with binge drinking. Suspect erosive disease (etoh gastritis, esophagitis) but PUD not excluded given daily NSAID use. A CTscan in April 2014 revealed circumferential wall thickening involving the distal esophagus  with fluid in the distal esophagus. His BUN is normal, stool yellowish on exam and hgb stable at 11.9 so no evidence for significant bleeding. Patient obviously needs ETOH cessation, PPI and less NSAID use. Will likely need EGD for evaluation of intermittent black stools and abnormal CTscan findings.   3. Thrombocytopenia, probably bone marrow suppression from ETOH  4. Depression, hx of suicide ideation.   5. Acute right anterior 6th rib fracture  6. Psoriasis  7. Mild transaminitis, likely ETOH and severe steatosis seen on April 2014 CTscan  Thanks   LOS: 3 days   Paula Guenther  05/02/2013, 9:22 AM    Attending physician's note   I have taken a history, examined the patient and reviewed the chart. I agree with the Advanced Practitioner's note, impression and recommendations. Possible history of melena but no active bleeding at this time. Prior CT with thickened distal esophageal wall is likely esophagitis. EGD tomorrow to further evaluate. PPI daily for now. Avoid ETOH.   Julis Haubner T   Majesta Leichter, MD FACG  

## 2013-05-03 ENCOUNTER — Encounter (HOSPITAL_COMMUNITY)
Admission: EM | Disposition: A | Discharge status: Home / Self Care | Payer: Self-pay | Source: Home / Self Care | Attending: Internal Medicine

## 2013-05-03 ENCOUNTER — Encounter (HOSPITAL_COMMUNITY): Payer: Self-pay | Admitting: *Deleted

## 2013-05-03 DIAGNOSIS — R933 Abnormal findings on diagnostic imaging of other parts of digestive tract: Secondary | ICD-10-CM

## 2013-05-03 DIAGNOSIS — E872 Acidosis: Secondary | ICD-10-CM

## 2013-05-03 DIAGNOSIS — R748 Abnormal levels of other serum enzymes: Secondary | ICD-10-CM

## 2013-05-03 DIAGNOSIS — F1010 Alcohol abuse, uncomplicated: Secondary | ICD-10-CM

## 2013-05-03 DIAGNOSIS — F329 Major depressive disorder, single episode, unspecified: Secondary | ICD-10-CM

## 2013-05-03 DIAGNOSIS — D649 Anemia, unspecified: Secondary | ICD-10-CM

## 2013-05-03 DIAGNOSIS — R000 Tachycardia, unspecified: Secondary | ICD-10-CM

## 2013-05-03 HISTORY — PX: ESOPHAGOGASTRODUODENOSCOPY: SHX5428

## 2013-05-03 LAB — CBC
MCH: 30.6 pg (ref 26.0–34.0)
MCHC: 33.2 g/dL (ref 30.0–36.0)
MCV: 92.1 fL (ref 78.0–100.0)
WBC: 6.1 10*3/uL (ref 4.0–10.5)

## 2013-05-03 LAB — COMPREHENSIVE METABOLIC PANEL
BUN: 7 mg/dL (ref 6–23)
CO2: 29 mEq/L (ref 19–32)
Calcium: 9.4 mg/dL (ref 8.4–10.5)
Creatinine, Ser: 0.62 mg/dL (ref 0.50–1.35)
GFR calc Af Amer: >90 mL/min (ref >90)
GFR calc non Af Amer: >90 mL/min (ref >90)
Glucose, Bld: 106 mg/dL — ABNORMAL HIGH (ref 70–99)
Sodium: 135 mEq/L (ref 135–145)
Total Protein: 6.4 g/dL (ref 6.0–8.3)

## 2013-05-03 LAB — MAGNESIUM: Magnesium: 2 mg/dL (ref 1.5–2.5)

## 2013-05-03 SURGERY — EGD (ESOPHAGOGASTRODUODENOSCOPY)
Anesthesia: Moderate Sedation

## 2013-05-03 MED ORDER — DIPHENHYDRAMINE HCL 50 MG/ML IJ SOLN
INTRAMUSCULAR | Status: AC
  Filled 2013-05-03: qty 1

## 2013-05-03 MED ORDER — MIDAZOLAM HCL 10 MG/2ML IJ SOLN
INTRAMUSCULAR | Status: DC | PRN
  Administered 2013-05-03: 2 mg via INTRAVENOUS

## 2013-05-03 MED ORDER — MIDAZOLAM HCL 10 MG/2ML IJ SOLN
INTRAMUSCULAR | Status: AC
  Filled 2013-05-03: qty 2

## 2013-05-03 MED ORDER — FENTANYL CITRATE 0.05 MG/ML IJ SOLN
INTRAMUSCULAR | Status: AC
  Filled 2013-05-03: qty 2

## 2013-05-03 MED ORDER — DIPHENHYDRAMINE HCL 50 MG/ML IJ SOLN
INTRAMUSCULAR | Status: DC | PRN
  Administered 2013-05-03: 25 mg via INTRAVENOUS

## 2013-05-03 MED ORDER — FENTANYL CITRATE 0.05 MG/ML IJ SOLN
INTRAMUSCULAR | Status: DC | PRN
  Administered 2013-05-03 (×2): 25 ug via INTRAVENOUS

## 2013-05-03 MED ORDER — BUTAMBEN-TETRACAINE-BENZOCAINE 2-2-14 % EX AERO
INHALATION_SPRAY | CUTANEOUS | Status: DC | PRN
  Administered 2013-05-03: 2 via TOPICAL

## 2013-05-03 NOTE — Interval H&P Note (Signed)
History and Physical Interval Note:  05/03/2013 11:14 AM  Jeffrey Black  has presented today for surgery, with the diagnosis of nausea, vomiting, abnormal esophageal findings on April 2014 CTscan  The various methods of treatment have been discussed with the patient and family. After consideration of risks, benefits and other options for treatment, the patient has consented to  Procedure(s): ESOPHAGOGASTRODUODENOSCOPY (EGD) (N/A) as a surgical intervention .  The patient's history has been reviewed, patient examined, no change in status, stable for surgery.  I have reviewed the patient's chart and labs.  Questions were answered to the patient's satisfaction.     Venita Lick. Russella Dar MD

## 2013-05-03 NOTE — Progress Notes (Signed)
Clinical Social Work  Per chart review, psych MD requesting CSW assist with outpatient substance abuse resources. CSW attempted to meet with patient in order to better assist with creating a plan to remain sober at DC. Patient sleeping at this time. CSW will continue to follow.  [place], [place] [phone]

## 2013-05-03 NOTE — Op Note (Signed)
[facility] [address] [place], [address]   ENDOSCOPY PROCEDURE REPORT  PATIENT: Jeffrey Black, Jeffrey Black  MR#: [id number] BIRTHDATE: [birth year] , 52  yrs. old GENDER: Male ENDOSCOPIST: Meryl Dare, MD, [facility] REFERRED BY:  [facility] PROCEDURE DATE:  05/03/2013 PROCEDURE:  EGD w/ biopsy ASA CLASS:     Class II INDICATIONS:  Anemia.  abnormal CT of the GI tract-thickened distal esophageal wall.  possible history of melena. MEDICATIONS: These medications were titrated to patient response per physician's verbal order, Fentanyl 50 mcg IV, Versed 4 mg IV, and Diphenhydramine (Benadryl) 25 mg IV TOPICAL ANESTHETIC: Cetacaine Spray DESCRIPTION OF PROCEDURE: After the risks benefits and alternatives of the procedure were thoroughly explained, informed consent was obtained.  The Pentax Gastroscope [id number] endoscope was introduced through the mouth and advanced to the second portion of the duodenum  without limitations.  The instrument was slowly withdrawn as the mucosa was fully examined.  ESOPHAGUS: There was evidence of suspected Barrett's esophagus in the lower third of the esophagus.  Multiple biopsies were performed using cold forceps.  There was LA Class B esophagitis noted at the proximal margin of the Barretts.  The esophagus was otherwise normal. STOMACH: Erosive gastritis (inflammation) was found in the gastric antrum.   The stomach otherwise appeared normal. DUODENUM: The duodenal mucosa showed no abnormalities in the bulb and second portion of the duodenum.  Retroflexed views revealed a small hiatal hernia.     The scope was then withdrawn from the patient and the procedure completed. COMPLICATIONS: There were no complications.  ENDOSCOPIC IMPRESSION: 1.    Suspected Barrett's esophagus; multiple biopsies 2.     LA Class B esophagitis 3.     Erosive gastritis in the antrum 4.    Small hiatal hernia  RECOMMENDATIONS: 1.  Anti-reflux regimen  long term 2.  Await pathology results 3.  PPI bid for 8 weeks, then qam indefinitely   eSigned:  Meryl Dare, MD, [facility] 05/03/2013 11:42 AM  Gertie Fey Copy]

## 2013-05-03 NOTE — H&P (View-Only) (Signed)
[facility] Consultation  Referring Provider:  [facility] Primary Care Physician:  No primary provider on file. Primary Gastroenterologist: none         Reason for Consultation:   GI bleed         HPI:   Jeffrey Black is a 52 y.o. male discharged from the hospital 04/20/13 after being admitted with alcohol intoxication / suicidal ideation.  Patient now readmitted with alcohol intoxication, nausea, and vomiting.  We were asked to see him for black stools. Patient gives a history of chronic intermittent black stools associated with binge drinking. No hematemesis but he does have intermittent nausea and vomiting with binges. He takes NSAIDS on a daily basis. He complains of upper abdominal discomfort for which he takes [place]. Prilosec on home med list patient patient hasn't taken it in a very long time.   Past Medical History  Diagnosis Date  . ETOH abuse   . Psoriasis     Past Surgical History  Procedure Laterality Date  . Left 4th finger surgery      Family History  Problem Relation Age of Onset  . Stroke Father      History  Substance Use Topics  . Smoking status: Current Every Day Smoker -- 1.00 packs/day for 20 years    Types: Cigarettes  . Smokeless tobacco: Never Used  . Alcohol Use: Yes     Comment: drinks 1 pt per day    Prior to Admission medications   Medication Sig Start Date End Date Taking? Authorizing Provider  escitalopram (LEXAPRO) 10 MG tablet Take 1 tablet (10 mg total) by mouth daily. For depression 04/20/13   Sanjuana Kava, NP  gabapentin (NEURONTIN) 300 MG capsule Take 1 capsule (300 mg total) by mouth 3 (three) times daily. For anxiety/pain control 04/20/13   Sanjuana Kava, NP  metoprolol tartrate (LOPRESSOR) 25 MG tablet Take 0.5 tablets (12.5 mg total) by mouth 2 (two) times daily. For high blood pressure control 04/23/13   John-Adam Bonk, MD  omeprazole (PRILOSEC) 20 MG capsule Take 1 capsule (20 mg total) by mouth daily as needed (for acid  relux). For acid reflux 04/20/13   Sanjuana Kava, NP  QUEtiapine (SEROQUEL) 100 MG tablet Take 1 tablet (100 mg total) by mouth at bedtime. For mood control 04/20/13   Sanjuana Kava, NP    Current Facility-Administered Medications  Medication Dose Route Frequency Provider Last Rate Last Dose  . 0.9 %  sodium chloride infusion   Intravenous Continuous Belkys A Regalado, MD 100 mL/hr at 05/01/13 0527    . acetaminophen (TYLENOL) tablet 650 mg  650 mg Oral Q6H PRN Catarina Hartshorn, MD       Or  . acetaminophen (TYLENOL) suppository 650 mg  650 mg Rectal Q6H PRN Catarina Hartshorn, MD      . calcium carbonate (TUMS - dosed in mg elemental calcium) chewable tablet 200 mg of elemental calcium  1 tablet Oral Q6H PRN Jinger Neighbors, NP   200 mg of elemental calcium at 04/29/13 2245  . escitalopram (LEXAPRO) tablet 10 mg  10 mg Oral Daily Catarina Hartshorn, MD   10 mg at 05/01/13 0900  . folic acid (FOLVITE) tablet 1 mg  1 mg Oral Daily Suzi Roots, MD   1 mg at 05/01/13 0901  . HYDROcodone-acetaminophen (NORCO/VICODIN) 5-325 MG per tablet 1 tablet  1 tablet Oral Q6H PRN Alba Cory, MD   1 tablet at 05/01/13 0757  . LORazepam (  ATIVAN) tablet 1 mg  1 mg Oral Q6H PRN Suzi Roots, MD   1 mg at 04/30/13 1249   Or  . LORazepam (ATIVAN) injection 1 mg  1 mg Intravenous Q6H PRN Suzi Roots, MD   1 mg at 04/29/13 1718  . LORazepam (ATIVAN) tablet 1 mg  1 mg Oral Q4H PRN Catarina Hartshorn, MD       Or  . LORazepam (ATIVAN) injection 1 mg  1 mg Intravenous Q4H PRN Catarina Hartshorn, MD   1 mg at 05/02/13 5409  . LORazepam (ATIVAN) tablet 0-4 mg  0-4 mg Oral Q12H Suzi Roots, MD   4 mg at 05/01/13 1557  . magnesium oxide (MAG-OX) tablet 200 mg  200 mg Oral BID Belkys A Regalado, MD   200 mg at 05/01/13 2156  . metoprolol tartrate (LOPRESSOR) tablet 12.5 mg  12.5 mg Oral BID Catarina Hartshorn, MD   12.5 mg at 05/01/13 2155  . morphine 2 MG/ML injection 2 mg  2 mg Intravenous Q3H PRN Catarina Hartshorn, MD   2 mg at 05/02/13 0255  . multivitamin with  minerals tablet 1 tablet  1 tablet Oral Daily Suzi Roots, MD   1 tablet at 05/01/13 0900  . nicotine (NICODERM CQ - dosed in mg/24 hours) patch 14 mg  14 mg Transdermal Daily Catarina Hartshorn, MD   14 mg at 05/01/13 1024  . ondansetron (ZOFRAN) tablet 4 mg  4 mg Oral Q6H PRN Catarina Hartshorn, MD       Or  . ondansetron [facility]) injection 4 mg  4 mg Intravenous Q6H PRN Catarina Hartshorn, MD   4 mg at 04/29/13 2025  . pantoprazole (PROTONIX) EC tablet 40 mg  40 mg Oral BID Belkys A Regalado, MD   40 mg at 05/01/13 2203  . potassium chloride SA (K-DUR,KLOR-CON) CR tablet 40 mEq  40 mEq Oral Once Belkys A Regalado, MD      . QUEtiapine (SEROQUEL) tablet 100 mg  100 mg Oral QHS Catarina Hartshorn, MD   100 mg at 05/01/13 2156  . sodium chloride 0.9 % 1,000 mL with potassium chloride 20 mEq infusion   Intravenous Continuous Catarina Hartshorn, MD 100 mL/hr at 04/30/13 0538    . sodium chloride 0.9 % injection 3 mL  3 mL Intravenous Q12H Catarina Hartshorn, MD   3 mL at 05/01/13 0901  . thiamine (VITAMIN B-1) tablet 100 mg  100 mg Oral Daily Catarina Hartshorn, MD   100 mg at 05/01/13 0900   Or  . thiamine (B-1) injection 100 mg  100 mg Intravenous Daily Catarina Hartshorn, MD        Allergies as of 04/29/2013  . (No Known Allergies)    Review of Systems:    Itching with psoriasis. All systems reviewed and negative except where noted in HPI.   Physical Exam:  Vital signs in last 24 hours: Temp:  [97.7 F (36.5 C)-98.1 F (36.7 C)] 98 F (36.7 C) (06/16 0600) Pulse Rate:  [72-99] 90 (06/16 0600) Resp:  [18-20] 18 (06/16 0600) BP: (138-166)/(72-118) 151/92 mmHg (06/16 0600) SpO2:  [97 %-98 %] 97 % (06/16 0600) Last BM Date: 05/02/13 General: male in NAD Head:  Normocephalic and atraumatic. Eyes:   No icterus.   Conjunctiva pink. Ears:  Normal auditory acuity. Neck:  Supple; no masses felt Lungs:  Respirations even and unlabored. Lungs clear to auscultation bilaterally.   No wheezes, crackles, or rhonchi.  Heart:  Regular rate and rhythm Abdomen:  Soft, nondistended, nontender. Normal bowel sounds. No appreciable masses or hepatomegaly.  Rectal:  Perianal erythema. Redness between gluteal folds. Yellowish stool in vault..  Msk:  Symmetrical without gross deformities.  Extremities:  Without edema. Neurologic:  Alert and  oriented x4;  grossly normal neurologically. Skin:  Several psoriatic skin rashes on extremities and ? Perianal area Cervical Nodes:  No significant cervical adenopathy. Psych:  Alert and cooperative. Normal affect.  LAB RESULTS:  Recent Labs  04/30/13 0846 05/01/13 0453 05/02/13 0445  WBC 6.1 5.8 6.0  HGB 10.5* 10.5* 11.9*  HCT 30.8* 31.0* 36.2*  PLT 109* 94* 91*   BMET  Recent Labs  04/30/13 0210 05/01/13 0453 05/02/13 0445  NA 134* 135 135  K 3.5 2.9* 3.5  CL 97 99 98  CO2 21 28 26   GLUCOSE 94 110* 102*  BUN 13 9 7   CREATININE 0.55 0.58 0.59  CALCIUM 8.6 8.7 9.6   LFT  Recent Labs  05/02/13 0840  PROT 6.9  ALBUMIN 3.5  AST 79*  ALT 63*  ALKPHOS 94  BILITOT 0.3  BILIDIR 0.1  IBILI 0.2*   PT/INR  Recent Labs  04/30/13 0210  LABPROT 13.1  INR 1.00    STUDIES: Dg Ribs Bilateral  04/30/2013   *RADIOLOGY REPORT*  Clinical Data: Fall.  Bilateral chest wall pain.  BILATERAL RIBS - 3+ VIEW  Comparison: 04/29/2013  Findings: A minimally-displaced right anterior sixth rib fracture is seen which appears acute. No acute left-sided rib fractures are identified.  A subacute healing nondisplaced rib fractures are seen involving the right lateral eighth and ninth ribs, and the posterior 11th rib.  There is also an incompletely healed left posterior 11th rib fracture which shows some callus formation.  No evidence of pneumothorax or hemothorax.  Atelectasis is noted in the right lower lung zone.  IMPRESSION:  1.  Acute mildly displaced right anterior sixth rib fracture. 2.  Other bilateral subacute, healing rib fractures, as described above.   Original Report Authenticated By: Myles Rosenthal, M.D.    PREVIOUS ENDOSCOPIES:            none    Impression / Plan:   64. 52 year old male with long history of ETOH abuse, admitted with acute intoxication.   2. Chronic, intermittent black stools associated with binge drinking. Suspect erosive disease (etoh gastritis, esophagitis) but PUD not excluded given daily NSAID use. A CTscan in April 2014 revealed circumferential wall thickening involving the distal esophagus  with fluid in the distal esophagus. His BUN is normal, stool yellowish on exam and hgb stable at 11.9 so no evidence for significant bleeding. Patient obviously needs ETOH cessation, PPI and less NSAID use. Will likely need EGD for evaluation of intermittent black stools and abnormal CTscan findings.   3. Thrombocytopenia, probably bone marrow suppression from ETOH  4. Depression, hx of suicide ideation.   5. Acute right anterior 6th rib fracture  6. Psoriasis  7. Mild transaminitis, likely ETOH and severe steatosis seen on April 2014 CTscan  Thanks   LOS: 3 days   Willette Cluster  05/02/2013, 9:22 AM    Attending physician's note   I have taken a history, examined the patient and reviewed the chart. I agree with the Advanced Practitioner's note, impression and recommendations. Possible history of melena but no active bleeding at this time. Prior CT with thickened distal esophageal wall is likely esophagitis. EGD tomorrow to further evaluate. PPI daily for now. Avoid ETOH.   Venita Lick  Russella Dar, MD [facility]

## 2013-05-03 NOTE — Progress Notes (Signed)
[facility] PROGRESS NOTE  Jeffrey Black ZOX:[id number] DOB: 11/18/1960 DOA: 04/29/2013 PCP: No primary provider on file.  Assessment/Plan: 52 year old with PMH significant for alcohol abuse, depression, suicidal thought, multiples admission with alcohol withdrawal, admitted on 6-13 after the patient was found in the floor intoxicated on alcohol. Patient has had multiple fall. Patient has been receiving treatment for alcohol withdrawal with ativan. He develop hallucination during this admission secondary to alcohol withdrawal. Hallucination has resolved. Patient was evaluated by psychiatrist during this admission. Patient does not meet criteria for acute psychiatric hospitalization. Patient wants out patient substance abuse treatment.  During this admission patient hemoglobin decrease and he was complaining of melena. GI was consulted. Patient underwent endoscopy procedure 6-17 which show Suspected Barrett's esophagus; LA Class B esophagitis, Erosive gastritis in the antrum. Small hiatal hernia. Patient will need PPI BID for 8 weeks.    Alcohol intoxication with early signs of withdrawal  -Continue alcohol withdrawal protocol  -Continue thiamine, folic acid, multivitamin  -Urine drug screen negative for cocaine.  -Patient decline  inpatient alcohol rehab..  -hallucination has resolved.   Transaminases; Mildly increases. Likely secondary to alcohol. Ammonia at 23.   Anemia/ Melena: Hb stable.  guaiac stool positive. Hold Lovenox, scd.  Patient S/P endoscopy 6-17 that show Suspected Barrett's esophagus; LA Class B esophagitis, Erosive gastritis in the antrum. Small hiatal hernia. Needs PPI BID  for 8 weeks then daily indefinitely.   Hypokalemia: resolved.   Tachycardia/SVT -Improved.  -Has been a chronic problem. -04/15/13 TSH 1.202  -Continue with metoprolol.   Abdominal pain with vomiting  -Resolved.  -Likely due to alcoholic gastritis  -Continue PPI, change to po. -Continue  IV fluids  -Lipase negative.   Tobacco use  -Smokes 1-1/2 packs per day x30 years  -Nicoderm patch   Chest pain  -Secondary to Rib fracture.  -Morphine PRN, Vicodin. - troponin times 2 negative.  -Incentive spirometry.   Metabolic acidosis  -Resolved.  -Likely due to alcoholic ketosis  - lactic acid at 4. Repeated at 1.  -Continue IV fluids   Fall:secondary to alcohol intoxication. CT head negative.  Thrombocytopenia: Probably Secondary to BM supresion from alcohol. Continue to monitor. Improving.   Code Status: Full Code.  Family Communication: Care discussed with patient.  Disposition Plan: Remain inpatient.    Consultants:  Psych  Procedures:  none  Antibiotics:  none  HPI/Subjective: Patient seen earlier this morning. No complaints, waiting for endoscopy. No abdominal pain. Still with chest pain.    Objective: Filed Vitals:   05/03/13 1147 05/03/13 1200 05/03/13 1300 05/03/13 1321  BP: 148/105 139/106 137/98 153/104  Pulse: 74   76  Temp: 98.6 F (37 C)   98.1 F (36.7 C)  TempSrc: Oral   Oral  Resp: 16 18 49 20  Height:      Weight:      SpO2: 96% 95% 96% 98%    Intake/Output Summary (Last 24 hours) at 05/03/13 1513 Last data filed at 05/02/13 1800  Gross per 24 hour  Intake    120 ml  Output      0 ml  Net    120 ml   Filed Weights   04/29/13 1945  Weight: 92.4 kg (203 lb 11.3 oz)    Exam:   General: awake in no distress,   Cardiovascular: S 1, S 2, RRR  Respiratory: CTA  Abdomen: Bs present, soft, NT  Musculoskeletal: no edema.   Neuro exam non focal.   Data  Reviewed: Basic Metabolic Panel:  Recent Labs Lab 04/29/13 0848 04/29/13 2016 04/30/13 0210 05/01/13 0453 05/01/13 0500 05/02/13 0445 05/03/13 0529  NA 139  --  134* 135  --  135 135  K 3.5  --  3.5 2.9*  --  3.5 4.0  CL 94*  --  97 99  --  98 101  CO2 19  --  21 28  --  26 29  GLUCOSE 81  --  94 110*  --  102* 106*  BUN 15  --  13 9  --  7 7  CREATININE  0.65  --  0.55 0.58  --  0.59 0.62  CALCIUM 8.7  --  8.6 8.7  --  9.6 9.4  MG  --  1.8  --   --  1.8  --  2.0   Liver Function Tests:  Recent Labs Lab 04/29/13 0848 05/02/13 0840 05/03/13 0529  AST 43* 79* 81*  ALT 32 63* 87*  ALKPHOS 96 94 91  BILITOT 0.5 0.3 0.2*  PROT 7.6 6.9 6.4  ALBUMIN 3.7 3.5 3.3*    Recent Labs Lab 04/29/13 2016  LIPASE 32    Recent Labs Lab 05/02/13 0840  AMMONIA 23   CBC:  Recent Labs Lab 04/30/13 0210 04/30/13 0846 05/01/13 0453 05/02/13 0445 05/03/13 0529  WBC 5.4 6.1 5.8 6.0 6.1  HGB 10.5* 10.5* 10.5* 11.9* 11.3*  HCT 31.1* 30.8* 31.0* 36.2* 34.0*  MCV 90.9 91.4 91.2 91.2 92.1  PLT 108* 109* 94* 91* 94*   Cardiac Enzymes:  Recent Labs Lab 04/29/13 2016 04/30/13 0210 04/30/13 0846  TROPONINI <0.30 <0.30 <0.30   BNP (last 3 results)  Recent Labs  11/08/12 0540  PROBNP 34.6   CBG: No results found for this basename: GLUCAP,  in the last 168 hours  Recent Results (from the past 240 hour(s))  MRSA PCR SCREENING     Status: None   Collection Time    04/29/13  8:07 PM      Result Value Range Status   MRSA by PCR NEGATIVE  NEGATIVE Final   Comment:            The GeneXpert MRSA Assay (FDA     approved for NASAL specimens     only), is one component of a     comprehensive MRSA colonization     surveillance program. It is not     intended to diagnose MRSA     infection nor to guide or     monitor treatment for     MRSA infections.     Studies: No results found.  Scheduled Meds: . escitalopram  10 mg Oral Daily  . folic acid  1 mg Oral Daily  . LORazepam  0-4 mg Oral Q12H  . magnesium oxide  200 mg Oral BID  . metoprolol tartrate  12.5 mg Oral BID  . multivitamin with minerals  1 tablet Oral Daily  . nicotine  14 mg Transdermal Daily  . pantoprazole  40 mg Oral BID  . QUEtiapine  100 mg Oral QHS  . sodium chloride  3 mL Intravenous Q12H  . thiamine  100 mg Oral Daily   Or  . thiamine  100 mg  Intravenous Daily   Continuous Infusions: . sodium chloride 100 mL/hr at 05/01/13 0527  . sodium chloride 0.9 % 1,000 mL with potassium chloride 20 mEq infusion 100 mL/hr at 05/02/13 1148    Active Problems:   Alcohol withdrawal syndrome  Elevated liver enzymes   Depression   Alcohol intoxication   Alcohol dependence   Tachycardia   Anemia   Metabolic acidosis   Nonspecific (abnormal) findings on radiological and other examination of gastrointestinal tract    Time spent: 25 minutes.     Malon Siddall  [facility] Pager [phone]. If 7PM-7AM, please contact night-coverage at [email or web address], password [facility] 05/03/2013, 3:13 PM  LOS: 4 days

## 2013-05-04 ENCOUNTER — Encounter (HOSPITAL_COMMUNITY): Payer: Self-pay | Admitting: Gastroenterology

## 2013-05-04 ENCOUNTER — Encounter: Payer: Self-pay | Admitting: Gastroenterology

## 2013-05-04 LAB — CBC
Hemoglobin: 11.7 g/dL — ABNORMAL LOW (ref 13.0–17.0)
MCH: 30.9 pg (ref 26.0–34.0)
Platelets: 124 10*3/uL — ABNORMAL LOW (ref 150–400)
RBC: 3.79 MIL/uL — ABNORMAL LOW (ref 4.22–5.81)
WBC: 6.5 10*3/uL (ref 4.0–10.5)

## 2013-05-04 LAB — COMPREHENSIVE METABOLIC PANEL
BUN: 8 mg/dL (ref 6–23)
CO2: 26 mEq/L (ref 19–32)
Calcium: 9.2 mg/dL (ref 8.4–10.5)
Creatinine, Ser: 0.62 mg/dL (ref 0.50–1.35)
GFR calc Af Amer: >90 mL/min (ref >90)
GFR calc non Af Amer: >90 mL/min (ref >90)
Glucose, Bld: 92 mg/dL (ref 70–99)
Total Protein: 6.3 g/dL (ref 6.0–8.3)

## 2013-05-04 MED ORDER — METOPROLOL TARTRATE 25 MG PO TABS: 25.0000 mg | ORAL_TABLET | Freq: Two times a day (BID) | ORAL | Status: DC

## 2013-05-04 MED ORDER — PANTOPRAZOLE SODIUM 40 MG PO TBEC: 40.0000 mg | DELAYED_RELEASE_TABLET | Freq: Two times a day (BID) | ORAL | Status: DC

## 2013-05-04 MED ORDER — THIAMINE HCL 100 MG PO TABS: 100.0000 mg | ORAL_TABLET | Freq: Every day | ORAL | Status: DC

## 2013-05-04 MED ORDER — FOLIC ACID 1 MG PO TABS: 1.0000 mg | ORAL_TABLET | Freq: Every day | ORAL | Status: DC

## 2013-05-04 MED ORDER — OXYCODONE HCL 5 MG PO TABS: 5.0000 mg | ORAL_TABLET | ORAL | Status: DC | PRN

## 2013-05-04 NOTE — Progress Notes (Signed)
Clinical Social Work Progress Note PSYCHIATRY SERVICE LINE 05/04/2013  Patient:  Jeffrey Black  Account:  [id number]  Admit Date:  04/29/2013  Clinical Social Worker:  Unk Lightning, LCSW  Date/Time:  05/04/2013 09:45 AM  Review of Patient  Overall Medical Condition:   Patient reports he is feeling better and is ready to DC.   Participation Level:  Active  Participation Quality  Appropriate   Other Participation Quality:   Patient had appropriate eye contact and engaged throughout assessment.   Affect  Appropriate   Cognitive  Appropriate   Reaction to Medications/Concerns:   Patient reports that when he left [facility] they "underwrote" his prescriptions. Patient reports he is going to follow up with outpatient agency so they can adjust his medications.   Modes of Intervention  Support  Solution-Focused   Summary of Progress/Plan at Discharge   CSW met with patient at bedside. Patient sitting in chair and watching TV. Patient agreeable to session with CSW and reports that he is feeling much better.    CSW and patient discussed patient's DC plans. Patient reports he wants to remain sober. CSW encouraged patient to develop a plan prior to DC to ensure he had support to remain sober. Patient reports he will follow up with AA meetings. Due to patient's high SBIRT score, CSW suggested inpatient therapy. Patient declining inpatient and reports it is not beneficial. CSW suggested intensive outpatient therapy. Patient has previous experience with IOP and reports if he changes his mind about returning he is aware how to schedule an appointment.    Patient reports that after he DC from [facility] they provided him information for [facility]. Patient reports he has not scheduled an appointment yet but has their information and is agreeable to schedule an appointment at DC.    Patient engaged in assessment but continues to refuse resources. Patient has been to several treatment facilities in the past  and is knowledgeable about resources if he decides he wants treatment.    CSW is signing off but available if further needs arise.

## 2013-05-04 NOTE — Discharge Summary (Signed)
Physician Discharge Summary  Jeffrey Black JYN:[id number] DOB: Aug 27, 1961 DOA: 04/29/2013  PCP: No primary provider on file.  Admit date: 04/29/2013 Discharge date: 05/04/2013  Time spent: 40 minutes  Recommendations for Outpatient Follow-up:  1. Follow up with primary care provider. Patient has been provided a list of MDs, which whom he can establish care.  2. Follow up with outpatient psychiatrist.  3. Follow up with Dr Claudette Head, GI.   Discharge Diagnoses:  Active Problems:   Alcohol withdrawal syndrome   Elevated liver enzymes   Depression   Alcohol intoxication   Alcohol dependence   Tachycardia   Anemia   Metabolic acidosis   Nonspecific (abnormal) findings on radiological and other examination of gastrointestinal tract   Discharge Condition: Satisfactory.   Diet recommendation: Regular.   Filed Weights   04/29/13 1945  Weight: 92.4 kg (203 lb 11.3 oz)    History of present illness:  52 year old with PMH significant for alcohol abuse, multiple falls, depression, suicidal ideation, s/p multiple admissions for alcohol withdrawal, admitted on 04/29/13 after he was found on the floor, intoxicated on alcohol. Admitted for further management.    Hospital Course:  1. Alcohol intoxication/Withdrawal: Patient has a known history of heavy alcohol use, and on day of admission, was found on floor, intoxicated, and with early features of ETOH withdrawal phenomena. He was managed with benzodiazepines, per [facility] protocol, as well as vitamin supplements, and although hospital course was punctuated by hallucinosis, clinical response was satisfactory. As of 05/03/13, ETOH withdrawal had resolved. He has been counseled appropriately, was evaluated by psychiatrist during this hospitalization, and apparently, does not meet criteria for acute psychiatric hospitalization. Inpatient ETOH rehab was offered, but patient declined.  2. ETOH abuse/Transaminitis: Patent was noted to have a  mild-moderate transaminitis, as well as a mild metabolic acidosis, likely due to alcoholic ketosis. Lactic acid was 4 at presentation, but had normalized at 1.0 on repeat testing. Ammonia was normal at 23.  3. Anemia/Abdominal pain: At presentation, patient had upper abdominal pain, associated with vomiting. He also complained of melenic stools. Lipase was negative, Hb stable. Stool Guaiac positive. Dr Claudette Head provided GI consultation, and patient is s/p upper GI endoscopy 05/03/13, that showed suspected Barrett's esophagus; LA Class B esophagitis, Erosive gastritis in the antrum. Small hiatal hernia. Recommended PPI BID for 8 weeks then daily, indefinitely. Biopsies were taken. He will follow up with Dr Russella Dar, on discharge.  4. Thrombocytopenia: Likely seocndary to ETOH abuse. Platelet count was 186 on admission, dropped to a low of 91 on 05/02/13, and as of 05/04/13, was 124.  5. Tachycardia/SVT: This was a transient phenmenon, and reportedly, has been a chronic problem. Controlled on Metoprolol. TSH was 1.202 on 04/15/13.  6. Tobacco use: Patient smokes 1-1/2 packs per day x 30 years. He was counseled appropriately, and managed with Nicoderm patch.   7. Chest pain: Patient did complain of chest pain during hospitalization, CEs were negative and CXR of 04/30/13, confirmed acute mildly displaced right anterior sixth rib fracture, as well as other bilateral subacute, healing rib fractures. Managed satisfactorily with analgesics.  8. Fall: Patient has a history of multiple falls, secondary to alcohol intoxication. CT head negative. As described in #7 above, he did sustain rib fractures, but fortunately, no other bony injuries.  9. HTN: BP was found to be sub-optimal in AM of 05/04/13. Have increased Lopressor to 25 mg BID, on discharge.   Procedures:  See Below.   EGD 05/03/13.  Consultations:  Dr Claudette Head, GI.   Discharge Exam: Filed Vitals:   05/03/13 1300 05/03/13 1321 05/03/13 2114  05/04/13 0630  BP: 137/98 153/104 162/118 157/109  Pulse:  76 99 80  Temp:  98.1 F (36.7 C) 98.6 F (37 C) 97.7 F (36.5 C)  TempSrc:  Oral Oral Axillary  Resp: 49 20 20 20   Height:      Weight:      SpO2: 96% 98% 99% 98%    General: Comfortable, alert, communicative, fully oriented, not short of breath at rest.  HEENT:  No clinical pallor, no jaundice, no conjunctival injection or discharge. Hydration is fair.  NECK:  Supple, JVP not seen, no carotid bruits, no palpable lymphadenopathy, no palpable goiter. CHEST:  Clinically clear to auscultation, no wheezes, no crackles. HEART:  Sounds 1 and 2 heard, normal, regular, no murmurs. ABDOMEN:  Full, soft, non-tender, no palpable organomegaly, no palpable masses, normal bowel sounds. GENITALIA:  Not excamined. LOWER EXTREMITIES:  No pitting edema, palpable peripheral pulses. MUSCULOSKELETAL SYSTEM:  Unremarkable, apart from bilateral lower rib cage tenderness. . CENTRAL NERVOUS SYSTEM:  No focal neurologic deficit on gross examination.  Discharge Instructions     Medication List    STOP taking these medications       omeprazole 20 MG capsule  Commonly known as:  PRILOSEC      TAKE these medications       escitalopram 10 MG tablet  Commonly known as:  LEXAPRO  Take 1 tablet (10 mg total) by mouth daily. For depression     folic acid 1 MG tablet  Commonly known as:  FOLVITE  Take 1 tablet (1 mg total) by mouth daily.     gabapentin 300 MG capsule  Commonly known as:  NEURONTIN  Take 1 capsule (300 mg total) by mouth 3 (three) times daily. For anxiety/pain control     metoprolol tartrate 25 MG tablet  Commonly known as:  LOPRESSOR  Take 1 tablet (25 mg total) by mouth 2 (two) times daily.     oxyCODONE 5 MG immediate release tablet  Commonly known as:  Oxy IR/ROXICODONE  Take 1 tablet (5 mg total) by mouth every 4 (four) hours as needed for pain.     pantoprazole 40 MG tablet  Commonly known as:  PROTONIX  Take  1 tablet (40 mg total) by mouth 2 (two) times daily.     QUEtiapine 100 MG tablet  Commonly known as:  SEROQUEL  Take 1 tablet (100 mg total) by mouth at bedtime. For mood control     thiamine 100 MG tablet  Take 1 tablet (100 mg total) by mouth daily.       No Known Allergies Follow-up Information   Schedule an appointment as soon as possible for a visit with Judie Petit T. Russella Dar, MD.   Contact information:   [address] [place] [address] [phone]       Please follow up. (Follow up with outpatient psychiatry.)       Please follow up. (Please utilize resources provided, to establish primary MD. )        The results of significant diagnostics from this hospitalization (including imaging, microbiology, ancillary and laboratory) are listed below for reference.    Significant Diagnostic Studies: Dg Ribs Bilateral  04/30/2013   *RADIOLOGY REPORT*  Clinical Data: Fall.  Bilateral chest wall pain.  BILATERAL RIBS - 3+ VIEW  Comparison: 04/29/2013  Findings: A minimally-displaced right anterior sixth rib fracture is  seen which appears acute. No acute left-sided rib fractures are identified.  A subacute healing nondisplaced rib fractures are seen involving the right lateral eighth and ninth ribs, and the posterior 11th rib.  There is also an incompletely healed left posterior 11th rib fracture which shows some callus formation.  No evidence of pneumothorax or hemothorax.  Atelectasis is noted in the right lower lung zone.  IMPRESSION:  1.  Acute mildly displaced right anterior sixth rib fracture. 2.  Other bilateral subacute, healing rib fractures, as described above.   Original Report Authenticated By: Myles Rosenthal, M.D.   Ct Head Wo Contrast  04/29/2013   *RADIOLOGY REPORT*  Clinical Data:  Fall  CT HEAD WITHOUT CONTRAST CT CERVICAL SPINE WITHOUT CONTRAST  Technique:  Multidetector CT imaging of the head and cervical spine was performed following the standard protocol without  intravenous contrast.  Multiplanar CT image reconstructions of the cervical spine were also generated.  Comparison:  CT 04/22/2013  CT HEAD  Findings: Ventricle size is normal.  Mild atrophy for age. Negative for hemorrhage mass or infarction.  No change from the prior study.  Negative for skull fracture.  IMPRESSION: Mild atrophy without acute abnormality.  CT CERVICAL SPINE  Findings: Image quality degraded by patient motion.  Allowing for this no fracture is identified.  There is mild cervical disc degeneration.  IMPRESSION: Image quality degraded by motion.  No fracture or malalignment is identified.   Original Report Authenticated By: Janeece Riggers, M.D.   Ct Head Wo Contrast  04/22/2013   *RADIOLOGY REPORT*  Clinical Data:  Fall down stairs, intoxicated  CT HEAD WITHOUT CONTRAST CT CERVICAL SPINE WITHOUT CONTRAST  Technique:  Multidetector CT imaging of the head and cervical spine was performed following the standard protocol without intravenous contrast.  Multiplanar CT image reconstructions of the cervical spine were also generated.  Comparison:  12/02/2008  CT HEAD  Findings: No evidence of parenchymal hemorrhage or extra-axial fluid collection. No mass lesion, mass effect, or midline shift.  No CT evidence of acute infarction.  Mild subcortical white matter and periventricular small vessel ischemic changes.  Cerebral volume is age appropriate.  No ventriculomegaly.  The visualized paranasal sinuses are essentially clear. The mastoid air cells are unopacified.  No evidence of calvarial fracture.  IMPRESSION: No evidence of acute intracranial abnormality.  CT CERVICAL SPINE  Findings: Straightening of the cervical spine, likely positional.  No evidence of fracture or dislocation.  Vertebral body heights are maintained.  The dens appears intact.  No prevertebral soft tissue swelling.  Mild multilevel degenerative changes.  Visualized thyroid is unremarkable.  Small bilateral cervical lymph nodes, likely  reactive.  Visualized lungs are notable for mild right apical pleural parenchymal scarring.  IMPRESSION: No evidence of traumatic injury to the cervical spine.  Mild multilevel degenerative changes.   Original Report Authenticated By: Charline Bills, M.D.   Ct Cervical Spine Wo Contrast  04/29/2013   *RADIOLOGY REPORT*  Clinical Data:  Fall  CT HEAD WITHOUT CONTRAST CT CERVICAL SPINE WITHOUT CONTRAST  Technique:  Multidetector CT imaging of the head and cervical spine was performed following the standard protocol without intravenous contrast.  Multiplanar CT image reconstructions of the cervical spine were also generated.  Comparison:  CT 04/22/2013  CT HEAD  Findings: Ventricle size is normal.  Mild atrophy for age. Negative for hemorrhage mass or infarction.  No change from the prior study.  Negative for skull fracture.  IMPRESSION: Mild atrophy without acute abnormality.  CT CERVICAL  SPINE  Findings: Image quality degraded by patient motion.  Allowing for this no fracture is identified.  There is mild cervical disc degeneration.  IMPRESSION: Image quality degraded by motion.  No fracture or malalignment is identified.   Original Report Authenticated By: Janeece Riggers, M.D.   Ct Cervical Spine Wo Contrast  04/22/2013   *RADIOLOGY REPORT*  Clinical Data:  Fall down stairs, intoxicated  CT HEAD WITHOUT CONTRAST CT CERVICAL SPINE WITHOUT CONTRAST  Technique:  Multidetector CT imaging of the head and cervical spine was performed following the standard protocol without intravenous contrast.  Multiplanar CT image reconstructions of the cervical spine were also generated.  Comparison:  12/02/2008  CT HEAD  Findings: No evidence of parenchymal hemorrhage or extra-axial fluid collection. No mass lesion, mass effect, or midline shift.  No CT evidence of acute infarction.  Mild subcortical white matter and periventricular small vessel ischemic changes.  Cerebral volume is age appropriate.  No ventriculomegaly.  The  visualized paranasal sinuses are essentially clear. The mastoid air cells are unopacified.  No evidence of calvarial fracture.  IMPRESSION: No evidence of acute intracranial abnormality.  CT CERVICAL SPINE  Findings: Straightening of the cervical spine, likely positional.  No evidence of fracture or dislocation.  Vertebral body heights are maintained.  The dens appears intact.  No prevertebral soft tissue swelling.  Mild multilevel degenerative changes.  Visualized thyroid is unremarkable.  Small bilateral cervical lymph nodes, likely reactive.  Visualized lungs are notable for mild right apical pleural parenchymal scarring.  IMPRESSION: No evidence of traumatic injury to the cervical spine.  Mild multilevel degenerative changes.   Original Report Authenticated By: Charline Bills, M.D.   Dg Pelvis Portable  04/29/2013   *RADIOLOGY REPORT*  Clinical Data: 52 year old male status post fall.  Unresponsive. Pain.  PORTABLE PELVIS  Comparison: CT abdomen 02/25/2013.  Findings: Portable AP view the pelvis.  Femoral heads normally located.  Hip joint spaces preserved. Bone mineralization is within normal limits.  Both proximal femurs appear grossly intact.  Pelvis intact.  Sacral ala and SI joints appear intact.  No acute fracture identified.  Negative visible bowel gas pattern.  IMPRESSION: No acute fracture or dislocation identified about the pelvis.  If there is localizing hip pain, recommend follow-up hip series.   Original Report Authenticated By: Erskine Speed, M.D.   Dg Chest Port 1 View  04/29/2013   *RADIOLOGY REPORT*  Clinical Data: History of fall.  Shortness of breath.  Weakness.  PORTABLE CHEST - 1 VIEW  Comparison: Chest x-ray 02/22/2013.  Findings: Lung volumes are low.  No pneumothorax.  Linear opacities in the lung bases bilaterally, favored to reflect subsegmental atelectasis.  No definite acute consolidative airspace disease.  No pleural effusions.  Crowding of the pulmonary vasculature,  accentuated by low lung volumes, without frank pulmonary edema. Borderline cardiomegaly. The patient is rotated to the right on today's exam, resulting in distortion of the mediastinal contours and reduced diagnostic sensitivity and specificity for mediastinal pathology.  IMPRESSION: 1.  Low lung volumes with bibasilar subsegmental atelectasis.   Original Report Authenticated By: Trudie Reed, M.D.    Microbiology: Recent Results (from the past 240 hour(s))  MRSA PCR SCREENING     Status: None   Collection Time    04/29/13  8:07 PM      Result Value Range Status   MRSA by PCR NEGATIVE  NEGATIVE Final   Comment:            The GeneXpert MRSA Assay (  FDA     approved for NASAL specimens     only), is one component of a     comprehensive MRSA colonization     surveillance program. It is not     intended to diagnose MRSA     infection nor to guide or     monitor treatment for     MRSA infections.     Labs: Basic Metabolic Panel:  Recent Labs Lab 04/29/13 2016 04/30/13 0210 05/01/13 0453 05/01/13 0500 05/02/13 0445 05/03/13 0529 05/04/13 0524  NA  --  134* 135  --  135 135 137  K  --  3.5 2.9*  --  3.5 4.0 3.9  CL  --  97 99  --  98 101 102  CO2  --  21 28  --  26 29 26   GLUCOSE  --  94 110*  --  102* 106* 92  BUN  --  13 9  --  7 7 8   CREATININE  --  0.55 0.58  --  0.59 0.62 0.62  CALCIUM  --  8.6 8.7  --  9.6 9.4 9.2  MG 1.8  --   --  1.8  --  2.0  --    Liver Function Tests:  Recent Labs Lab 04/29/13 0848 05/02/13 0840 05/03/13 0529 05/04/13 0524  AST 43* 79* 81* 105*  ALT 32 63* 87* 145*  ALKPHOS 96 94 91 101  BILITOT 0.5 0.3 0.2* 0.2*  PROT 7.6 6.9 6.4 6.3  ALBUMIN 3.7 3.5 3.3* 3.3*    Recent Labs Lab 04/29/13 2016  LIPASE 32    Recent Labs Lab 05/02/13 0840  AMMONIA 23   CBC:  Recent Labs Lab 04/30/13 0846 05/01/13 0453 05/02/13 0445 05/03/13 0529 05/04/13 0524  WBC 6.1 5.8 6.0 6.1 6.5  HGB 10.5* 10.5* 11.9* 11.3* 11.7*  HCT 30.8*  31.0* 36.2* 34.0* 35.3*  MCV 91.4 91.2 91.2 92.1 93.1  PLT 109* 94* 91* 94* 124*   Cardiac Enzymes:  Recent Labs Lab 04/29/13 2016 04/30/13 0210 04/30/13 0846  TROPONINI <0.30 <0.30 <0.30   BNP: BNP (last 3 results)  Recent Labs  11/08/12 0540  PROBNP 34.6   CBG: No results found for this basename: GLUCAP,  in the last 168 hours     Signed:  Delmer Kowalski,CHRISTOPHER  [facility] 05/04/2013, 11:27 AM

## 2013-05-10 ENCOUNTER — Encounter (HOSPITAL_COMMUNITY): Payer: Self-pay | Admitting: *Deleted

## 2013-05-10 ENCOUNTER — Emergency Department (HOSPITAL_COMMUNITY)
Admission: EM | Admit: 2013-05-10 | Discharge: 2013-05-11 | Disposition: A | Discharge status: Home / Self Care | Payer: PRIVATE HEALTH INSURANCE | Attending: Emergency Medicine | Admitting: Emergency Medicine

## 2013-05-10 DIAGNOSIS — F10920 Alcohol use, unspecified with intoxication, uncomplicated: Secondary | ICD-10-CM

## 2013-05-10 DIAGNOSIS — Z859 Personal history of malignant neoplasm, unspecified: Secondary | ICD-10-CM | POA: Insufficient documentation

## 2013-05-10 DIAGNOSIS — F1010 Alcohol abuse, uncomplicated: Secondary | ICD-10-CM | POA: Insufficient documentation

## 2013-05-10 DIAGNOSIS — Z872 Personal history of diseases of the skin and subcutaneous tissue: Secondary | ICD-10-CM | POA: Insufficient documentation

## 2013-05-10 DIAGNOSIS — F17200 Nicotine dependence, unspecified, uncomplicated: Secondary | ICD-10-CM | POA: Insufficient documentation

## 2013-05-10 LAB — BASIC METABOLIC PANEL
BUN: 17 mg/dL (ref 6–23)
Chloride: 90 mEq/L — ABNORMAL LOW (ref 96–112)
GFR calc Af Amer: >90 mL/min (ref >90)
GFR calc non Af Amer: 82 mL/min — ABNORMAL LOW (ref >90)
Potassium: 4 mEq/L (ref 3.5–5.1)
Sodium: 138 mEq/L (ref 135–145)

## 2013-05-10 LAB — CBC WITH DIFFERENTIAL/PLATELET
Basophils Absolute: 0 10*3/uL (ref 0.0–0.1)
Basophils Relative: 0 % (ref 0–1)
Eosinophils Absolute: 0 10*3/uL (ref 0.0–0.7)
HCT: 42.9 % (ref 39.0–52.0)
Hemoglobin: 14.5 g/dL (ref 13.0–17.0)
Lymphocytes Relative: 6 % — ABNORMAL LOW (ref 12–46)
Monocytes Relative: 6 % (ref 3–12)
Neutro Abs: 11 10*3/uL — ABNORMAL HIGH (ref 1.7–7.7)
Neutrophils Relative %: 88 % — ABNORMAL HIGH (ref 43–77)
RDW: 13.5 % (ref 11.5–15.5)
WBC: 12.4 10*3/uL — ABNORMAL HIGH (ref 4.0–10.5)

## 2013-05-10 MED ORDER — SODIUM CHLORIDE 0.9 % IV BOLUS (SEPSIS)
1000.0000 mL | Freq: Once | INTRAVENOUS | Status: AC
  Administered 2013-05-10: 1000 mL via INTRAVENOUS

## 2013-05-10 MED ORDER — LORAZEPAM 1 MG PO TABS
1.0000 mg | ORAL_TABLET | Freq: Four times a day (QID) | ORAL | Status: DC | PRN
  Administered 2013-05-10 – 2013-05-11 (×2): 1 mg via ORAL
  Filled 2013-05-10 (×2): qty 1

## 2013-05-10 MED ORDER — FOLIC ACID 1 MG PO TABS
1.0000 mg | ORAL_TABLET | Freq: Every day | ORAL | Status: DC
  Administered 2013-05-10: 1 mg via ORAL
  Filled 2013-05-10: qty 1

## 2013-05-10 MED ORDER — ADULT MULTIVITAMIN W/MINERALS CH
1.0000 | ORAL_TABLET | Freq: Every day | ORAL | Status: DC
  Administered 2013-05-10: 1 via ORAL
  Filled 2013-05-10: qty 1

## 2013-05-10 MED ORDER — VITAMIN B-1 100 MG PO TABS
100.0000 mg | ORAL_TABLET | Freq: Every day | ORAL | Status: DC
  Administered 2013-05-10: 100 mg via ORAL
  Filled 2013-05-10: qty 1

## 2013-05-10 MED ORDER — SODIUM CHLORIDE 0.9 % IV SOLN: Freq: Once | INTRAVENOUS | Status: DC

## 2013-05-10 MED ORDER — LORAZEPAM 2 MG/ML IJ SOLN
1.0000 mg | Freq: Four times a day (QID) | INTRAMUSCULAR | Status: DC | PRN
  Filled 2013-05-10: qty 1

## 2013-05-10 MED ORDER — THIAMINE HCL 100 MG/ML IJ SOLN: 100.0000 mg | Freq: Every day | INTRAMUSCULAR | Status: DC

## 2013-05-10 NOTE — ED Notes (Signed)
Patient states he can not remember the last he drank ETOH

## 2013-05-10 NOTE — ED Notes (Signed)
EMS called to hotel on [address]  Found sitting in lobby.  Patient requested for 911 be called. He reports that he drank 3/4 of a bottle of vodka today with pain from cracking ribs last week.   Currently he rates his pain 7 of 10.

## 2013-05-10 NOTE — ED Notes (Signed)
Pt states he is unable to give a urine sample at this time. Urinal is at bedside and sitter will alert when he has a sample

## 2013-05-11 LAB — URINE MICROSCOPIC-ADD ON

## 2013-05-11 LAB — RAPID URINE DRUG SCREEN, HOSP PERFORMED
Barbiturates: NOT DETECTED
Benzodiazepines: NOT DETECTED
Cocaine: NOT DETECTED
Opiates: NOT DETECTED

## 2013-05-11 LAB — URINALYSIS, ROUTINE W REFLEX MICROSCOPIC
Bilirubin Urine: NEGATIVE
Glucose, UA: NEGATIVE mg/dL
Specific Gravity, Urine: 1.027 (ref 1.005–1.030)
Urobilinogen, UA: 0.2 mg/dL (ref 0.0–1.0)
pH: 5.5 (ref 5.0–8.0)

## 2013-05-11 MED ORDER — CALCIUM CARBONATE ANTACID 500 MG PO CHEW
1.0000 | CHEWABLE_TABLET | Freq: Once | ORAL | Status: AC
  Administered 2013-05-11: 200 mg via ORAL
  Filled 2013-05-11: qty 1

## 2013-05-11 MED ORDER — LORAZEPAM 2 MG/ML IJ SOLN
2.0000 mg | Freq: Once | INTRAMUSCULAR | Status: AC
  Administered 2013-05-11: 2 mg via INTRAVENOUS

## 2013-05-11 NOTE — ED Provider Notes (Signed)
History    CSN: [id number] Arrival date & time 05/10/13  2018  First MD Initiated Contact with Patient 05/10/13 2019     Chief Complaint  Patient presents with  . Alcohol Intoxication   (Consider location/radiation/quality/duration/timing/severity/associated sxs/prior Treatment) HPI Comments: Pt comes in intoxicated. States that he has broken ribs, no pain meds so he has been drinking. Pt has hx of alcohol abuse. Pt denies nausea, emesis, fevers, chills, chest pains, shortness of breath, headaches, abdominal pain, uti like symptoms.   Patient is a 52 y.o. male presenting with intoxication. The history is provided by the patient and medical records.  Alcohol Intoxication Pertinent negatives include no chest pain, no abdominal pain, no headaches and no shortness of breath.   Past Medical History  Diagnosis Date  . ETOH abuse   . Psoriasis   . Cancer     Mother and sister unknown type of cancer   Past Surgical History  Procedure Laterality Date  . Left 4th finger surgery    . Esophagogastroduodenoscopy N/A 05/03/2013    Procedure: ESOPHAGOGASTRODUODENOSCOPY (EGD);  Surgeon: Meryl Dare, MD;  Location: [facility];  Service: Endoscopy;  Laterality: N/A;   Family History  Problem Relation Age of Onset  . Stroke Father   . Diabetes type II Mother   . Cancer - Colon Other    History  Substance Use Topics  . Smoking status: Current Every Day Smoker -- 1.00 packs/day for 20 years    Types: Cigarettes  . Smokeless tobacco: Never Used  . Alcohol Use: Yes     Comment: drinks 1 pt per day    Review of Systems  Constitutional: Positive for activity change.  Respiratory: Negative for chest tightness and shortness of breath.   Cardiovascular: Negative for chest pain.  Gastrointestinal: Negative for nausea, vomiting and abdominal pain.  Neurological: Negative for headaches.    Allergies  Review of patient's allergies indicates no known allergies.  Home Medications    Current Outpatient Rx  Name  Route  Sig  Dispense  Refill  . escitalopram (LEXAPRO) 10 MG tablet   Oral   Take 1 tablet (10 mg total) by mouth daily. For depression   30 tablet   0   . folic acid (FOLVITE) 1 MG tablet   Oral   Take 1 tablet (1 mg total) by mouth daily.   30 tablet   0   . gabapentin (NEURONTIN) 300 MG capsule   Oral   Take 1 capsule (300 mg total) by mouth 3 (three) times daily. For anxiety/pain control   90 capsule   0   . metoprolol tartrate (LOPRESSOR) 25 MG tablet   Oral   Take 1 tablet (25 mg total) by mouth 2 (two) times daily.   60 tablet   0   . oxyCODONE (OXY IR/ROXICODONE) 5 MG immediate release tablet   Oral   Take 1 tablet (5 mg total) by mouth every 4 (four) hours as needed for pain.   30 tablet   0   . pantoprazole (PROTONIX) 40 MG tablet   Oral   Take 1 tablet (40 mg total) by mouth 2 (two) times daily.   60 tablet   4   . QUEtiapine (SEROQUEL) 100 MG tablet   Oral   Take 1 tablet (100 mg total) by mouth at bedtime. For mood control   30 tablet   0   . thiamine 100 MG tablet   Oral   Take 1 tablet (100  mg total) by mouth daily.   30 tablet   0    BP 107/64  Pulse 124  Temp(Src) 98 F (36.7 C) (Oral)  Resp 18  SpO2 98% Physical Exam  Nursing note and vitals reviewed. Constitutional: He is oriented to person, place, and time. He appears well-developed.  HENT:  Head: Normocephalic and atraumatic.  Eyes: Conjunctivae and EOM are normal. Pupils are equal, round, and reactive to light.  Neck: Normal range of motion. Neck supple.  Cardiovascular: Normal rate and regular rhythm.   Pulmonary/Chest: Effort normal and breath sounds normal.  Abdominal: Soft. Bowel sounds are normal. He exhibits no distension. There is no tenderness. There is no rebound and no guarding.  Neurological: He is alert and oriented to person, place, and time.  Skin: Skin is warm.    ED Course  Procedures (including critical care time) Labs  Reviewed  CBC WITH DIFFERENTIAL - Abnormal; Notable for the following:    WBC 12.4 (*)    Neutrophils Relative % 88 (*)    Lymphocytes Relative 6 (*)    Neutro Abs 11.0 (*)    All other components within normal limits  BASIC METABOLIC PANEL - Abnormal; Notable for the following:    Chloride 90 (*)    GFR calc non Af Amer 82 (*)    All other components within normal limits  ETHANOL - Abnormal; Notable for the following:    Alcohol, Ethyl (B) 361 (*)    All other components within normal limits  ACETAMINOPHEN LEVEL  URINE RAPID DRUG SCREEN (HOSP PERFORMED)  URINALYSIS, ROUTINE W REFLEX MICROSCOPIC   No results found. No diagnosis found.  MDM  Pt comes in intoxicated. No suicidal ideation, homicidal ideation right now. Will be legally sober at 7:00 am. No trauma per hx from EMS, no trauma on exam. Will monitor and discharge when clinically sober.  Derwood Kaplan, MD 05/11/13 [id number]

## 2013-05-11 NOTE — ED Notes (Signed)
Patient is alert and oriented x3.  He was given DC instructions and follow up visit instructions.  Patient gave verbal understanding.  He was DC ambulatory under his own power to home.  V/S stable.  He was not showing any signs of distress on DC 

## 2013-05-14 ENCOUNTER — Inpatient Hospital Stay (HOSPITAL_COMMUNITY)
Admission: EM | Admit: 2013-05-14 | Discharge: 2013-05-18 | DRG: 897 | Disposition: A | Discharge status: Home / Self Care | Payer: PRIVATE HEALTH INSURANCE | Attending: Internal Medicine | Admitting: Internal Medicine

## 2013-05-14 ENCOUNTER — Encounter (HOSPITAL_COMMUNITY): Payer: Self-pay | Admitting: Emergency Medicine

## 2013-05-14 DIAGNOSIS — F3289 Other specified depressive episodes: Secondary | ICD-10-CM | POA: Diagnosis present

## 2013-05-14 DIAGNOSIS — R7401 Elevation of levels of liver transaminase levels: Secondary | ICD-10-CM | POA: Diagnosis present

## 2013-05-14 DIAGNOSIS — F10129 Alcohol abuse with intoxication, unspecified: Secondary | ICD-10-CM | POA: Diagnosis present

## 2013-05-14 DIAGNOSIS — R748 Abnormal levels of other serum enzymes: Secondary | ICD-10-CM

## 2013-05-14 DIAGNOSIS — D696 Thrombocytopenia, unspecified: Secondary | ICD-10-CM

## 2013-05-14 DIAGNOSIS — Z791 Long term (current) use of non-steroidal anti-inflammatories (NSAID): Secondary | ICD-10-CM

## 2013-05-14 DIAGNOSIS — L408 Other psoriasis: Secondary | ICD-10-CM | POA: Diagnosis present

## 2013-05-14 DIAGNOSIS — F1010 Alcohol abuse, uncomplicated: Secondary | ICD-10-CM

## 2013-05-14 DIAGNOSIS — F17200 Nicotine dependence, unspecified, uncomplicated: Secondary | ICD-10-CM | POA: Diagnosis present

## 2013-05-14 DIAGNOSIS — R933 Abnormal findings on diagnostic imaging of other parts of digestive tract: Secondary | ICD-10-CM

## 2013-05-14 DIAGNOSIS — F10121 Alcohol abuse with intoxication delirium: Secondary | ICD-10-CM

## 2013-05-14 DIAGNOSIS — R7402 Elevation of levels of lactic acid dehydrogenase (LDH): Secondary | ICD-10-CM | POA: Diagnosis present

## 2013-05-14 DIAGNOSIS — D759 Disease of blood and blood-forming organs, unspecified: Secondary | ICD-10-CM | POA: Diagnosis present

## 2013-05-14 DIAGNOSIS — R4182 Altered mental status, unspecified: Secondary | ICD-10-CM

## 2013-05-14 DIAGNOSIS — F10229 Alcohol dependence with intoxication, unspecified: Principal | ICD-10-CM | POA: Diagnosis present

## 2013-05-14 DIAGNOSIS — IMO0001 Reserved for inherently not codable concepts without codable children: Secondary | ICD-10-CM

## 2013-05-14 DIAGNOSIS — E876 Hypokalemia: Secondary | ICD-10-CM

## 2013-05-14 DIAGNOSIS — D61818 Other pancytopenia: Secondary | ICD-10-CM

## 2013-05-14 DIAGNOSIS — F32A Depression, unspecified: Secondary | ICD-10-CM

## 2013-05-14 DIAGNOSIS — F10930 Alcohol use, unspecified with withdrawal, uncomplicated: Secondary | ICD-10-CM

## 2013-05-14 DIAGNOSIS — F10920 Alcohol use, unspecified with intoxication, uncomplicated: Secondary | ICD-10-CM

## 2013-05-14 DIAGNOSIS — Z79899 Other long term (current) drug therapy: Secondary | ICD-10-CM

## 2013-05-14 DIAGNOSIS — F10230 Alcohol dependence with withdrawal, uncomplicated: Secondary | ICD-10-CM

## 2013-05-14 DIAGNOSIS — D649 Anemia, unspecified: Secondary | ICD-10-CM

## 2013-05-14 DIAGNOSIS — E872 Acidosis, unspecified: Secondary | ICD-10-CM

## 2013-05-14 DIAGNOSIS — F1020 Alcohol dependence, uncomplicated: Secondary | ICD-10-CM

## 2013-05-14 DIAGNOSIS — R000 Tachycardia, unspecified: Secondary | ICD-10-CM

## 2013-05-14 DIAGNOSIS — F329 Major depressive disorder, single episode, unspecified: Secondary | ICD-10-CM | POA: Diagnosis present

## 2013-05-14 LAB — SALICYLATE LEVEL: Salicylate Lvl: <2 mg/dL — ABNORMAL LOW (ref 2.8–20.0)

## 2013-05-14 LAB — COMPREHENSIVE METABOLIC PANEL
AST: 212 U/L — ABNORMAL HIGH (ref 0–37)
Albumin: 3.9 g/dL (ref 3.5–5.2)
BUN: 9 mg/dL (ref 6–23)
Calcium: 9.7 mg/dL (ref 8.4–10.5)
Creatinine, Ser: 0.64 mg/dL (ref 0.50–1.35)
Total Bilirubin: 0.4 mg/dL (ref 0.3–1.2)
Total Protein: 7.2 g/dL (ref 6.0–8.3)

## 2013-05-14 LAB — GLUCOSE, CAPILLARY
Glucose-Capillary: 114 mg/dL — ABNORMAL HIGH (ref 70–99)
Glucose-Capillary: 74 mg/dL (ref 70–99)

## 2013-05-14 LAB — CBC
HCT: 39.4 % (ref 39.0–52.0)
MCH: 31 pg (ref 26.0–34.0)
MCHC: 34.3 g/dL (ref 30.0–36.0)
MCV: 90.6 fL (ref 78.0–100.0)
Platelets: 124 10*3/uL — ABNORMAL LOW (ref 150–400)
RDW: 13.2 % (ref 11.5–15.5)

## 2013-05-14 MED ORDER — SODIUM CHLORIDE 0.9 % IV SOLN
INTRAVENOUS | Status: DC
  Administered 2013-05-14 – 2013-05-18 (×6): via INTRAVENOUS

## 2013-05-14 MED ORDER — SODIUM CHLORIDE 0.9 % IV BOLUS (SEPSIS)
1000.0000 mL | Freq: Once | INTRAVENOUS | Status: AC
  Administered 2013-05-14: 1000 mL via INTRAVENOUS

## 2013-05-14 MED ORDER — BIOTENE DRY MOUTH MT LIQD
15.0000 mL | Freq: Two times a day (BID) | OROMUCOSAL | Status: DC
  Administered 2013-05-14 – 2013-05-15 (×2): 15 mL via OROMUCOSAL

## 2013-05-14 MED ORDER — LORAZEPAM 2 MG/ML IJ SOLN
2.0000 mg | Freq: Four times a day (QID) | INTRAMUSCULAR | Status: DC | PRN
  Administered 2013-05-14 (×2): 1 mg via INTRAVENOUS
  Administered 2013-05-15 (×3): 2 mg via INTRAVENOUS
  Filled 2013-05-14 (×4): qty 1

## 2013-05-14 MED ORDER — SODIUM CHLORIDE 0.9 % IV SOLN
INTRAVENOUS | Status: DC
  Administered 2013-05-14: 16:00:00 via INTRAVENOUS

## 2013-05-14 MED ORDER — FOLIC ACID 1 MG PO TABS
1.0000 mg | ORAL_TABLET | Freq: Every day | ORAL | Status: DC
  Administered 2013-05-15 – 2013-05-18 (×4): 1 mg via ORAL
  Filled 2013-05-14 (×5): qty 1

## 2013-05-14 MED ORDER — ESCITALOPRAM OXALATE 10 MG PO TABS
10.0000 mg | ORAL_TABLET | Freq: Every day | ORAL | Status: DC
  Filled 2013-05-14: qty 1

## 2013-05-14 MED ORDER — PANTOPRAZOLE SODIUM 40 MG PO TBEC
40.0000 mg | DELAYED_RELEASE_TABLET | Freq: Two times a day (BID) | ORAL | Status: DC
  Administered 2013-05-14 – 2013-05-18 (×8): 40 mg via ORAL
  Filled 2013-05-14 (×10): qty 1

## 2013-05-14 MED ORDER — SODIUM CHLORIDE 0.9 % IV SOLN
INTRAVENOUS | Status: DC
  Administered 2013-05-14 (×2): via INTRAVENOUS

## 2013-05-14 MED ORDER — VITAMIN B-1 100 MG PO TABS
100.0000 mg | ORAL_TABLET | Freq: Every day | ORAL | Status: DC
  Administered 2013-05-15 – 2013-05-18 (×4): 100 mg via ORAL
  Filled 2013-05-14 (×5): qty 1

## 2013-05-14 MED ORDER — METOPROLOL TARTRATE 25 MG PO TABS
25.0000 mg | ORAL_TABLET | Freq: Two times a day (BID) | ORAL | Status: DC
  Administered 2013-05-14 – 2013-05-18 (×8): 25 mg via ORAL
  Filled 2013-05-14 (×11): qty 1

## 2013-05-14 MED ORDER — QUETIAPINE FUMARATE 100 MG PO TABS
100.0000 mg | ORAL_TABLET | Freq: Every day | ORAL | Status: DC
  Administered 2013-05-14 – 2013-05-17 (×4): 100 mg via ORAL
  Filled 2013-05-14 (×6): qty 1

## 2013-05-14 MED ORDER — PNEUMOCOCCAL VAC POLYVALENT 25 MCG/0.5ML IJ INJ
0.5000 mL | INJECTION | INTRAMUSCULAR | Status: AC
  Filled 2013-05-14 (×3): qty 0.5

## 2013-05-14 MED ORDER — ONDANSETRON HCL 4 MG/2ML IJ SOLN: 4.0000 mg | Freq: Four times a day (QID) | INTRAMUSCULAR | Status: DC | PRN

## 2013-05-14 MED ORDER — ONDANSETRON HCL 4 MG PO TABS: 4.0000 mg | ORAL_TABLET | Freq: Four times a day (QID) | ORAL | Status: DC | PRN

## 2013-05-14 MED ORDER — ENOXAPARIN SODIUM 40 MG/0.4ML ~~LOC~~ SOLN
40.0000 mg | SUBCUTANEOUS | Status: DC
  Administered 2013-05-14 – 2013-05-17 (×3): 40 mg via SUBCUTANEOUS
  Filled 2013-05-14 (×6): qty 0.4

## 2013-05-14 NOTE — ED Notes (Signed)
Per Pt's sister pt has been drinking very heavily today.  Per pt's sister pt walked down flight of stairs to the car on their way here.  Pt is very lethargic. Pt O2 was 86 on room air. Nasal trumpet placed as well as 3L: O2 via nasal canula pt O2 came up to 93%.

## 2013-05-14 NOTE — ED Notes (Signed)
Report called to ICU

## 2013-05-14 NOTE — ED Notes (Signed)
Attempted to cath patient per MD order for urine sample.  Only small amount obtained, not enough to send.  Sister reports patient has not been able to eat or drink anything due to vomiting.

## 2013-05-14 NOTE — ED Provider Notes (Signed)
History    CSN: [id number] Arrival date & time 05/14/13  1208  First MD Initiated Contact with Patient 05/14/13 1215     Chief Complaint  Patient presents with  . intoxicated    (Consider location/radiation/quality/duration/timing/severity/associated sxs/prior Treatment) The history is provided by the patient and a relative. The history is limited by the condition of the patient.  pt from home w sister, hx etoh abuse, presents w etoh intoxication, sister indicating pt interested in rehab/detox program. Notes multiple prior ED visits and hospital stays w same in past, including last week, but states went immediately back to drinking then. Sister indicates was able to walk down steps to her car, she brought her. No recent trauma or fall. She denies any other recent health problems or physical c/o. Pt not verbally responsive to questions, ?severely intoxicated - level 5 caveat.     Past Medical History  Diagnosis Date  . ETOH abuse   . Psoriasis   . Cancer     Mother and sister unknown type of cancer   Past Surgical History  Procedure Laterality Date  . Left 4th finger surgery    . Esophagogastroduodenoscopy N/A 05/03/2013    Procedure: ESOPHAGOGASTRODUODENOSCOPY (EGD);  Surgeon: Meryl Dare, MD;  Location: [facility];  Service: Endoscopy;  Laterality: N/A;   Family History  Problem Relation Age of Onset  . Stroke Father   . Diabetes type II Mother   . Cancer - Colon Other    History  Substance Use Topics  . Smoking status: Current Every Day Smoker -- 1.00 packs/day for 20 years    Types: Cigarettes  . Smokeless tobacco: Never Used  . Alcohol Use: Yes     Comment: drinks 1 pt per day    Review of Systems  Unable to perform ROS: Mental status change  level 5 caveat  Allergies  Review of patient's allergies indicates no known allergies.  Home Medications   Current Outpatient Rx  Name  Route  Sig  Dispense  Refill  . escitalopram (LEXAPRO) 10 MG tablet    Oral   Take 1 tablet (10 mg total) by mouth daily. For depression   30 tablet   0   . folic acid (FOLVITE) 1 MG tablet   Oral   Take 1 tablet (1 mg total) by mouth daily.   30 tablet   0   . gabapentin (NEURONTIN) 300 MG capsule   Oral   Take 1 capsule (300 mg total) by mouth 3 (three) times daily. For anxiety/pain control   90 capsule   0   . metoprolol tartrate (LOPRESSOR) 25 MG tablet   Oral   Take 1 tablet (25 mg total) by mouth 2 (two) times daily.   60 tablet   0   . oxyCODONE (OXY IR/ROXICODONE) 5 MG immediate release tablet   Oral   Take 1 tablet (5 mg total) by mouth every 4 (four) hours as needed for pain.   30 tablet   0   . pantoprazole (PROTONIX) 40 MG tablet   Oral   Take 1 tablet (40 mg total) by mouth 2 (two) times daily.   60 tablet   4   . QUEtiapine (SEROQUEL) 100 MG tablet   Oral   Take 1 tablet (100 mg total) by mouth at bedtime. For mood control   30 tablet   0   . thiamine 100 MG tablet   Oral   Take 1 tablet (100 mg total) by mouth  daily.   30 tablet   0    BP 137/87  Pulse 101  Resp 16  SpO2 93% Physical Exam  Nursing note and vitals reviewed. Constitutional: He appears well-developed and well-nourished. No distress.  HENT:  Head: Atraumatic.  Mouth/Throat: Oropharynx is clear and moist.  Eyes: Conjunctivae are normal. Pupils are equal, round, and reactive to light. No scleral icterus.  Neck: Neck supple. No tracheal deviation present.  Cardiovascular: Normal rate, regular rhythm and normal heart sounds.   Pulmonary/Chest: Effort normal and breath sounds normal. No accessory muscle usage. No respiratory distress. He exhibits no tenderness.  Abdominal: Soft. Bowel sounds are normal. He exhibits no distension. There is no tenderness.  Musculoskeletal: Normal range of motion. He exhibits no edema and no tenderness.  Neurological:  Pt appears intoxicated. Opens eyes spontaneously and to command. Makes purposeful movement w bil  extermities, but not compliant w neuro exam.   Skin: Skin is warm and dry.  Psychiatric:  Intoxicated, uncooperative.     ED Course  Procedures (including critical care time)  Results for orders placed during the hospital encounter of 05/14/13  GLUCOSE, CAPILLARY      Result Value Range   Glucose-Capillary 114 (*) 70 - 99 mg/dL  CBC      Result Value Range   WBC 6.6  4.0 - 10.5 K/uL   RBC 4.35  4.22 - 5.81 MIL/uL   Hemoglobin 13.5  13.0 - 17.0 g/dL   HCT 96.0  45.4 - 09.8 %   MCV 90.6  78.0 - 100.0 fL   MCH 31.0  26.0 - 34.0 pg   MCHC 34.3  30.0 - 36.0 g/dL   RDW 11.9  14.7 - 82.9 %   Platelets 124 (*) 150 - 400 K/uL  COMPREHENSIVE METABOLIC PANEL      Result Value Range   Sodium 136  135 - 145 mEq/L   Potassium 3.4 (*) 3.5 - 5.1 mEq/L   Chloride 91 (*) 96 - 112 mEq/L   CO2 25  19 - 32 mEq/L   Glucose, Bld 112 (*) 70 - 99 mg/dL   BUN 9  6 - 23 mg/dL   Creatinine, Ser 5.62  0.50 - 1.35 mg/dL   Calcium 9.7  8.4 - 13.0 mg/dL   Total Protein 7.2  6.0 - 8.3 g/dL   Albumin 3.9  3.5 - 5.2 g/dL   AST 865 (*) 0 - 37 U/L   ALT 212 (*) 0 - 53 U/L   Alkaline Phosphatase 147 (*) 39 - 117 U/L   Total Bilirubin 0.4  0.3 - 1.2 mg/dL   GFR calc non Af Amer >90  >90 mL/min   GFR calc Af Amer >90  >90 mL/min  ETHANOL      Result Value Range   Alcohol, Ethyl (B) 610 (*) 0 - 11 mg/dL  ACETAMINOPHEN LEVEL      Result Value Range   Acetaminophen (Tylenol), Serum <15.0  10 - 30 ug/mL  SALICYLATE LEVEL      Result Value Range   Salicylate Lvl <2.0 (*) 2.8 - 20.0 mg/dL      MDM  Iv ns. Labs.  Reviewed nursing notes and prior charts for additional history.   Given severe alcohol intoxication w altered mental status, hx ?complicated etoh withdrawal/hallucinations, will admit to med service.  [facility] paged to admit.  Recheck pt, maintaining airway, able to clear through, arousable, but peristently v intoxicated.  rr 14. o2 sats 94%.  Suzi Roots, MD 05/14/13  1400

## 2013-05-14 NOTE — H&P (Signed)
[facility] History and Physical  EINER MEALS NFA:[id number] DOB: January 15, 1961 DOA: 05/14/2013  Referring physician: ED physician PCP: No primary provider on file.   Chief Complaint: Altered mental status   HPI:  Patient is 52 year old male with long history of alcohol abuse, admitted to [facility] long hospital for acute intoxication. Please note that patient was not able to provide history at the time of admission but family present at bedside and provide useful information. Patient has long history of depression and has been admitted to rehabilitation facility in the past for 30 days duration, relapses soon after discharge. There is no reported fevers or chills, no shortness of breath or chest pain, no specific abdominal or urinary concerns at the time of admission.  In emergency department, patient with altered mental status and unable to provide history, blood alcohol level greater than 600, transaminase is elevated. [facility] asked to admit for further management   Assessment and Plan: Acute alcohol intoxication  - Will admit patient to step down unit, monitor for signs of withdrawal, provide supportive care  - Will provide IV fluids, analgesia as needed, anti-emetics as needed - Keep on CIWA protocol, Ativan 2 mg IV every 6 hours, continue multivitamin, folic acid, thiamine - Supplemented electrolytes such as potassium as indicated - Repeat complete metabolic panel and CBC in the morning - Keep n.p.o. 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 ETOH abuse   . Psoriasis   . Cancer     Mother and sister unknown type of cancer    Past Surgical History  Procedure Laterality Date  . Left 4th finger surgery    . Esophagogastroduodenoscopy N/A 05/03/2013    Procedure: ESOPHAGOGASTRODUODENOSCOPY (EGD);  Surgeon: Meryl Dare, MD;  Location: [facility];  Service: Endoscopy;  Laterality: N/A;    Social History:  reports that he has been smoking Cigarettes.  He has a 20 pack-year smoking history. He has never used smokeless tobacco. He reports that  drinks alcohol. He reports that he does not use illicit drugs.  No Known Allergies  Family History  Problem Relation Age of Onset  . Stroke Father   . Diabetes type II Mother   . Cancer - Colon Other     Prior to Admission medications   Medication Sig Start Date End Date Taking? Authorizing Provider  escitalopram (LEXAPRO) 10 MG tablet Take 1 tablet (10 mg total) by mouth daily. For depression 04/20/13  Yes Sanjuana Kava, NP  folic acid (FOLVITE) 1 MG tablet Take 1 tablet (1 mg total) by mouth daily. 05/04/13  Yes Laveda Norman, MD  gabapentin (NEURONTIN) 300 MG capsule Take 1 capsule (300 mg total) by mouth 3 (three) times daily. For anxiety/pain control 04/20/13  Yes Sanjuana Kava, NP  metoprolol tartrate (LOPRESSOR) 25 MG tablet Take 1 tablet (25 mg total) by mouth 2 (two) times daily. 05/04/13  Yes Laveda Norman, MD  oxyCODONE (OXY IR/ROXICODONE) 5 MG immediate release tablet Take 1 tablet (5 mg total) by  mouth every 4 (four) hours as needed for pain. 05/04/13  Yes Laveda Norman, MD  pantoprazole (PROTONIX) 40 MG tablet Take 1 tablet (40 mg total) by mouth 2 (two) times daily. 05/04/13  Yes Laveda Norman, MD  QUEtiapine (SEROQUEL) 100 MG tablet Take 1 tablet (100 mg total) by mouth at bedtime. For mood control 04/20/13  Yes Sanjuana Kava, NP  thiamine 100 MG tablet Take 1 tablet (100 mg total) by mouth daily. 05/04/13  Yes Laveda Norman, MD    Physical  Exam: Filed Vitals:   05/14/13 1226 05/14/13 1300  BP: 137/87 121/78  Pulse: 101 98  Resp: 16 14  SpO2: 93% 94%    Physical Exam  Constitutional: Intoxicated, more alert on exam, follows some commands appropriately  HENT: Normocephalic. External right and left ear normal. dry mucous membranes  Eyes: Conjunctivae and EOM are normal. PERRLA, no scleral icterus.  Neck: Normal ROM. Neck supple. No JVD. No tracheal deviation. No thyromegaly.  CVS: regular rhythm, tachycardic, S1/S2 +, no murmurs, no gallops, no carotid bruit.  Pulmonary: Effort and breath sounds normal, no stridor,  Decreased breath sounds at bases Abdominal: Soft. BS +,  no distension, tenderness, rebound or guarding.  Musculoskeletal: Normal range of motion. No edema and no tenderness.  Lymphadenopathy: No lymphadenopathy noted, cervical, inguinal. Neuro: overall somnolent but easy to arouse, follows some commands appropriately when awake Skin: Skin is warm and dry. No rash noted. Not diaphoretic. No erythema. No pallor.  Psychiatric: unable to assess due to altered mental status   Labs on Admission:  Basic Metabolic Panel:  Recent Labs Lab 05/10/13 2100 05/14/13 1245  NA 138 136  K 4.0 3.4*  CL 90* 91*  CO2 19 25  GLUCOSE 91 112*  BUN 17 9  CREATININE 1.03 0.64  CALCIUM 9.0 9.7   Liver Function Tests:  Recent Labs Lab 05/14/13 1245  AST 212*  ALT 212*  ALKPHOS 147*  BILITOT 0.4  PROT 7.2  ALBUMIN 3.9   CBC:  Recent Labs Lab 05/10/13 2100 05/14/13 1245  WBC 12.4* 6.6  NEUTROABS 11.0*  --   HGB 14.5 13.5  HCT 42.9 39.4  MCV 92.7 90.6  PLT 225 124*   CBG:  Recent Labs Lab 05/14/13 1225  GLUCAP 114*   Radiological Exams on Admission: No results found.  EKG: Normal sinus rhythm, no ST/T wave changes  Debbora Presto, MD  [facility] Pager [phone]  If 7PM-7AM, please contact night-coverage [email or web address] Password [facility] 05/14/2013, 2:21 PM

## 2013-05-15 DIAGNOSIS — F1010 Alcohol abuse, uncomplicated: Secondary | ICD-10-CM

## 2013-05-15 LAB — COMPREHENSIVE METABOLIC PANEL
BUN: 8 mg/dL (ref 6–23)
CO2: 22 mEq/L (ref 19–32)
Chloride: 102 mEq/L (ref 96–112)
Creatinine, Ser: 0.63 mg/dL (ref 0.50–1.35)
GFR calc Af Amer: >90 mL/min (ref >90)
GFR calc non Af Amer: >90 mL/min (ref >90)
Glucose, Bld: 64 mg/dL — ABNORMAL LOW (ref 70–99)
Total Bilirubin: 0.3 mg/dL (ref 0.3–1.2)

## 2013-05-15 LAB — GLUCOSE, CAPILLARY
Glucose-Capillary: 74 mg/dL (ref 70–99)
Glucose-Capillary: 201 mg/dL — ABNORMAL HIGH (ref 70–99)

## 2013-05-15 LAB — RAPID URINE DRUG SCREEN, HOSP PERFORMED
Benzodiazepines: NOT DETECTED
Cocaine: NOT DETECTED
Opiates: NOT DETECTED
Tetrahydrocannabinol: NOT DETECTED

## 2013-05-15 LAB — CBC
MCH: 31.4 pg (ref 26.0–34.0)
Platelets: 62 10*3/uL — ABNORMAL LOW (ref 150–400)
RBC: 3.47 MIL/uL — ABNORMAL LOW (ref 4.22–5.81)
WBC: 3.9 10*3/uL — ABNORMAL LOW (ref 4.0–10.5)

## 2013-05-15 LAB — MAGNESIUM: Magnesium: 1.6 mg/dL (ref 1.5–2.5)

## 2013-05-15 MED ORDER — ADULT MULTIVITAMIN W/MINERALS CH
1.0000 | ORAL_TABLET | Freq: Every day | ORAL | Status: DC
  Administered 2013-05-16 – 2013-05-18 (×3): 1 via ORAL
  Filled 2013-05-15 (×4): qty 1

## 2013-05-15 MED ORDER — INSULIN ASPART 100 UNIT/ML ~~LOC~~ SOLN
0.0000 [IU] | SUBCUTANEOUS | Status: DC
  Administered 2013-05-16 (×3): 1 [IU] via SUBCUTANEOUS
  Administered 2013-05-16: 2 [IU] via SUBCUTANEOUS
  Administered 2013-05-16: 3 [IU] via SUBCUTANEOUS
  Administered 2013-05-17 (×3): 1 [IU] via SUBCUTANEOUS

## 2013-05-15 MED ORDER — LORAZEPAM 1 MG PO TABS: 1.0000 mg | ORAL_TABLET | Freq: Four times a day (QID) | ORAL | Status: DC | PRN

## 2013-05-15 MED ORDER — DEXTROSE 50 % IV SOLN
50.0000 mL | Freq: Once | INTRAVENOUS | Status: AC | PRN
  Administered 2013-05-15: 50 mL via INTRAVENOUS

## 2013-05-15 MED ORDER — MORPHINE SULFATE 2 MG/ML IJ SOLN
1.0000 mg | INTRAMUSCULAR | Status: DC | PRN
  Administered 2013-05-15 (×2): 2 mg via INTRAVENOUS
  Administered 2013-05-18: 1 mg via INTRAVENOUS
  Administered 2013-05-18: 2 mg via INTRAVENOUS
  Filled 2013-05-15 (×4): qty 1

## 2013-05-15 MED ORDER — DEXTROSE 50 % IV SOLN
INTRAVENOUS | Status: AC
  Filled 2013-05-15: qty 50

## 2013-05-15 MED ORDER — LORAZEPAM 2 MG/ML IJ SOLN
1.0000 mg | Freq: Four times a day (QID) | INTRAMUSCULAR | Status: DC | PRN
  Administered 2013-05-16: 4 mg via INTRAVENOUS
  Administered 2013-05-16: 2 mg via INTRAVENOUS
  Administered 2013-05-16 (×2): 4 mg via INTRAVENOUS
  Administered 2013-05-17: 2 mg via INTRAVENOUS
  Filled 2013-05-15: qty 1
  Filled 2013-05-15 (×4): qty 2
  Filled 2013-05-15: qty 1

## 2013-05-15 NOTE — Progress Notes (Signed)
Patient ID: Jeffrey Black, male   DOB: 1961-08-27, 52 y.o.   MRN: [id number] [facility] PROGRESS NOTE  ULRICH SOULES WUJ:[id number] DOB: 09/23/1961 DOA: 05/14/2013 PCP: No primary provider on file.  HPI:  Patient is 52 year old male with long history of alcohol abuse, admitted to [facility] hospital for acute intoxication. Please note that patient was not able to provide history at the time of admission but family present at bedside and provide useful information. Patient has long history of depression and has been admitted to rehabilitation facility in the past for 30 days duration, relapses soon after discharge. There is no reported fevers or chills, no shortness of breath or chest pain, no specific abdominal or urinary concerns at the time of admission.   In emergency department, patient with altered mental status and unable to provide history, blood alcohol level greater than 600, transaminase is elevated. [facility] asked to admit for further management   Assessment and Plan:  Acute alcohol intoxication  - pt more alert this am, follows commands appropriately and tolerating clear liquid diet - continue IV fluids, analgesia as needed, anti-emetics as needed  - Keep on CIWA protocol, Ativan 2 mg IV every 6 hours, continue multivitamin, folic acid, thiamine  - Supplemented electrolytes such as potassium as indicated  - Repeat complete metabolic panel and CBC in the morning  - plan on advancing diet as pt tolerating  - monitor for signs of withdrawal  Thrombocytopenia  - Secondary to alcohol-induced bone marrow damage  - no signs of active bleeding at this time  Hypokalemia  - supplemented and within normal limits this AM Pancytopenia - likely sequela of alcohol induced bone marrow damage - Hg drop since admission also likely dilutional - no signs of active bleed, CBC in AM Transaminitis - secondary to alcohol abuse, trending down    Consultants:  None  Procedures/Studies:  None  Antibiotics:  None  Code Status: Full Family Communication: Pt at bedside Disposition Plan: Keep in SDU today   HPI/Subjective: No events overnight.   Objective: Filed Vitals:   05/15/13 0400 05/15/13 0500 05/15/13 0600 05/15/13 0700  BP: 110/65 98/49 112/93   Pulse: 93 100 98   Temp: 98.4 F (36.9 C)   99.2 F (37.3 C)  TempSrc: Oral   Oral  Resp: 21 16 25    Height:      Weight:  88.7 kg (195 lb 8.8 oz)    SpO2: 97% 99% 99%     Intake/Output Summary (Last 24 hours) at 05/15/13 0825 Last data filed at 05/15/13 0630  Gross per 24 hour  Intake 3802.08 ml  Output   1750 ml  Net 2052.08 ml    Exam:   General:  Pt is alert, follows commands appropriately, not in acute distress  Cardiovascular: Regular rate and rhythm, S1/S2, no murmurs, no rubs, no gallops  Respiratory: Clear to auscultation bilaterally, no wheezing, decreased breath sounds at bases  Abdomen: Soft, non tender, non distended, bowel sounds present, no guarding  Extremities: No edema, pulses DP and PT palpable bilaterally  Neuro: Still rather somnolent but easy to arouse, follows commands appropriately, tremors present   Data Reviewed: Basic Metabolic Panel:  Recent Labs Lab 05/10/13 2100 05/14/13 1245 05/15/13 0337  NA 138 136 140  K 4.0 3.4* 3.5  CL 90* 91* 102  CO2 19 25 22   GLUCOSE 91 112* 64*  BUN 17 9 8   CREATININE 1.03 0.64 0.63  CALCIUM 9.0 9.7 8.0*  MG  --   --  1.6   Liver Function Tests:  Recent Labs Lab 05/14/13 1245 05/15/13 0337  AST 212* 157*  ALT 212* 166*  ALKPHOS 147* 106  BILITOT 0.4 0.3  PROT 7.2 5.5*  ALBUMIN 3.9 2.9*   CBC:  Recent Labs Lab 05/10/13 2100 05/14/13 1245 05/15/13 0337  WBC 12.4* 6.6 3.9*  NEUTROABS 11.0*  --   --   HGB 14.5 13.5 10.9*  HCT 42.9 39.4 32.0*  MCV 92.7 90.6 92.2  PLT 225 124* 62*   CBG:  Recent Labs Lab 05/14/13 1225 05/14/13 1953 05/14/13 2131  05/15/13 0740  GLUCAP 114* 74 79 62*    Recent Results (from the past 240 hour(s))  MRSA PCR SCREENING     Status: None   Collection Time    05/14/13  3:34 PM      Result Value Range Status   MRSA by PCR NEGATIVE  NEGATIVE Final   Comment:            The GeneXpert MRSA Assay (FDA     approved for NASAL specimens     only), is one component of a     comprehensive MRSA colonization     surveillance program. It is not     intended to diagnose MRSA     infection nor to guide or     monitor treatment for     MRSA infections.     Scheduled Meds: . antiseptic oral rinse  15 mL Mouth Rinse BID  . enoxaparin (LOVENOX) injection  40 mg Subcutaneous Q24H  . folic acid  1 mg Oral Daily  . metoprolol tartrate  25 mg Oral BID  . pantoprazole  40 mg Oral BID  . pneumococcal 23 valent vaccine  0.5 mL Intramuscular Tomorrow-1000  . QUEtiapine  100 mg Oral QHS  . thiamine  100 mg Oral Daily   Continuous Infusions: . sodium chloride 125 mL/hr at 05/15/13 0630    Debbora Presto, MD  [facility] Pager [phone]  If 7PM-7AM, please contact night-coverage [email or web address] Password [email or web address] 05/15/2013, 8:25 AM   LOS: 1 day

## 2013-05-15 NOTE — Progress Notes (Signed)
Hypoglycemic Event  CBG: 64  Treatment: 15 GM carbohydrate snack  Symptoms: Shaky, Hungry and Nervous/irritable  Follow-up CBG: Time:0800 CBG Result:74  Possible Reasons for Event: Inadequate meal intake  Comments/MD notified:0800 Gaye Pollack, Denny Peon B  Remember to initiate Hypoglycemia Order Set & complete

## 2013-05-15 NOTE — Progress Notes (Addendum)
Patient increasingly agitated and uncooperative. Patient stated to nurse that he was going to be noncompliant, try to get out of bed, not listen to anyone until he gets a diet coke. Patient told by nurse that he is on clear liquid diet. Patient is currently ignoring nurse and not responding to questions. Ativan given (SEE MAR). Will continue to monitor.

## 2013-05-16 LAB — CBC
Hemoglobin: 10.9 g/dL — ABNORMAL LOW (ref 13.0–17.0)
MCH: 30.9 pg (ref 26.0–34.0)
MCHC: 34.5 g/dL (ref 30.0–36.0)
RDW: 12.9 % (ref 11.5–15.5)

## 2013-05-16 LAB — GLUCOSE, CAPILLARY
Glucose-Capillary: 128 mg/dL — ABNORMAL HIGH (ref 70–99)
Glucose-Capillary: 88 mg/dL (ref 70–99)
Glucose-Capillary: 111 mg/dL — ABNORMAL HIGH (ref 70–99)
Glucose-Capillary: 145 mg/dL — ABNORMAL HIGH (ref 70–99)
Glucose-Capillary: 171 mg/dL — ABNORMAL HIGH (ref 70–99)

## 2013-05-16 LAB — COMPREHENSIVE METABOLIC PANEL
ALT: 114 U/L — ABNORMAL HIGH (ref 0–53)
AST: 80 U/L — ABNORMAL HIGH (ref 0–37)
Alkaline Phosphatase: 100 U/L (ref 39–117)
CO2: 27 mEq/L (ref 19–32)
Chloride: 94 mEq/L — ABNORMAL LOW (ref 96–112)
GFR calc Af Amer: >90 mL/min (ref >90)
GFR calc non Af Amer: >90 mL/min (ref >90)
Glucose, Bld: 111 mg/dL — ABNORMAL HIGH (ref 70–99)
Potassium: 3 mEq/L — ABNORMAL LOW (ref 3.5–5.1)
Sodium: 131 mEq/L — ABNORMAL LOW (ref 135–145)

## 2013-05-16 MED ORDER — CHLORHEXIDINE GLUCONATE 0.12 % MT SOLN
15.0000 mL | Freq: Two times a day (BID) | OROMUCOSAL | Status: DC
  Administered 2013-05-16 (×2): 15 mL via OROMUCOSAL
  Filled 2013-05-16 (×2): qty 15

## 2013-05-16 MED ORDER — LORAZEPAM 2 MG/ML IJ SOLN
1.0000 mg | Freq: Four times a day (QID) | INTRAMUSCULAR | Status: DC
  Administered 2013-05-16 – 2013-05-17 (×4): 1 mg via INTRAVENOUS
  Filled 2013-05-16 (×3): qty 1

## 2013-05-16 MED ORDER — LORAZEPAM 1 MG PO TABS: 1.0000 mg | ORAL_TABLET | Freq: Four times a day (QID) | ORAL | Status: DC | PRN

## 2013-05-16 MED ORDER — BIOTENE DRY MOUTH MT LIQD
15.0000 mL | Freq: Two times a day (BID) | OROMUCOSAL | Status: DC
  Administered 2013-05-16 – 2013-05-18 (×5): 15 mL via OROMUCOSAL

## 2013-05-16 MED ORDER — LORAZEPAM 2 MG/ML IJ SOLN: 1.0000 mg | Freq: Four times a day (QID) | INTRAMUSCULAR | Status: DC | PRN

## 2013-05-16 NOTE — Progress Notes (Signed)
INITIAL NUTRITION ASSESSMENT  DOCUMENTATION CODES Per approved criteria  -Not Applicable   INTERVENTION: - Encouraged continued excellent intake - Recommend MD check cholesterol level per pt request  - Will continue to monitor   NUTRITION DIAGNOSIS: Altered GI function related to alcohol abuse/acute alcohol intoxication as evidenced by H&P.   Goal: Pt to consume >90% of meals  Monitor:  Weights, labs, intake  Reason for Assessment: Nutrition risk   52 y.o. male  Admitting Dx: Altered mental status  ASSESSMENT: Pt admitted acute intoxication with long history of alcohol abuse. Blood alcohol level greater than 600 mg/dL. Pt getting folic acid, thiamine, and multivitamin. Met with pt who reports eating "like a hog" PTA with great appetite and stable weight. Pt requests cholesterol level be checked. Pt with low sodium and potassium, elevated AST/ALT. Repots eating well this morning.   Height: Ht Readings from Last 1 Encounters:  05/14/13 6\' 2"  (1.88 m)    Weight: Wt Readings from Last 1 Encounters:  05/16/13 203 lb 7.8 oz (92.3 kg)    Ideal Body Weight: 190 lb  % Ideal Body Weight: 107%  Wt Readings from Last 10 Encounters:  05/16/13 203 lb 7.8 oz (92.3 kg)  04/29/13 203 lb 11.3 oz (92.4 kg)  04/29/13 203 lb 11.3 oz (92.4 kg)  04/17/13 210 lb (95.255 kg)  04/16/13 208 lb (94.348 kg)  04/13/13 209 lb 10.5 oz (95.1 kg)  02/22/13 211 lb 3.2 oz (95.8 kg)    Usual Body Weight: 203-211 lb  % Usual Body Weight: 96-100%  BMI:  Body mass index is 26.11 kg/(m^2).  Estimated Nutritional Needs: Kcal: 2300-2600 Protein: 110-140g Fluid: 2.3-2.6L/day  Skin: Intact   Diet Order: Carb Control  EDUCATION NEEDS: -No education needs identified at this time   Intake/Output Summary (Last 24 hours) at 05/16/13 1404 Last data filed at 05/16/13 1129  Gross per 24 hour  Intake   2000 ml  Output   4500 ml  Net  -2500 ml    Last BM: 6/30  Labs:   Recent Labs Lab  05/14/13 1245 05/15/13 0337 05/16/13 0855  NA 136 140 131*  K 3.4* 3.5 3.0*  CL 91* 102 94*  CO2 25 22 27   BUN 9 8 4*  CREATININE 0.64 0.63 0.54  CALCIUM 9.7 8.0* 8.3*  MG  --  1.6  --   GLUCOSE 112* 64* 111*    CBG (last 3)   Recent Labs  05/16/13 0352 05/16/13 0754 05/16/13 1145  GLUCAP 88 243* 111*    Scheduled Meds: . antiseptic oral rinse  15 mL Mouth Rinse q12n4p  . chlorhexidine  15 mL Mouth Rinse BID  . enoxaparin (LOVENOX) injection  40 mg Subcutaneous Q24H  . folic acid  1 mg Oral Daily  . insulin aspart  0-9 Units Subcutaneous Q4H  . LORazepam  1 mg Intravenous Q6H  . metoprolol tartrate  25 mg Oral BID  . multivitamin with minerals  1 tablet Oral Daily  . pantoprazole  40 mg Oral BID  . QUEtiapine  100 mg Oral QHS  . thiamine  100 mg Oral Daily    Continuous Infusions: . sodium chloride 125 mL/hr at 05/16/13 0700    Past Medical History  Diagnosis Date  . ETOH abuse   . Psoriasis   . Cancer     Mother and sister unknown type of cancer    Past Surgical History  Procedure Laterality Date  . Left 4th finger surgery    .  Esophagogastroduodenoscopy N/A 05/03/2013    Procedure: ESOPHAGOGASTRODUODENOSCOPY (EGD);  Surgeon: Meryl Dare, MD;  Location: [facility];  Service: Endoscopy;  Laterality: N/A;     Levon Hedger MS, RD, LDN [phone] Pager [phone] After Hours Pager

## 2013-05-16 NOTE — Progress Notes (Signed)
Patient ID: Jeffrey Black, male   DOB: [birth year], 52 y.o.   MRN: [id number] [facility] PROGRESS NOTE  Jeffrey Black WGN:[id number] DOB: 1961/01/08 DOA: 05/14/2013 PCP: No primary provider on file.  HPI:  Patient is 52 year old male with long history of alcohol abuse, admitted to [facility] long hospital for acute intoxication. Please note that patient was not able to provide history at the time of admission but family present at bedside and provide useful information. Patient has long history of depression and has been admitted to rehabilitation facility in the past for 30 days duration, relapses soon after discharge. There is no reported fevers or chills, no shortness of breath or chest pain, no specific abdominal or urinary concerns at the time of admission.  In emergency department, patient with altered mental status and unable to provide history, blood alcohol level greater than 600, transaminase is elevated. [facility] asked to admit for further management   Assessment and Plan:  Acute alcohol intoxication  - pt more alert this am, follows commands appropriately and tolerating clear liquid diet  - continue IV fluids, analgesia as needed, anti-emetics as needed  - Keep on CIWA protocol, Ativan 1 mg IV every 6 hours, continue multivitamin, folic acid, thiamine  - Supplemented electrolytes as indicated  - plan on advancing diet as pt tolerating  - monitor for signs of withdrawal  Thrombocytopenia  - Secondary to alcohol-induced bone marrow damage  - no signs of active bleeding at this time  - CBC in AM Hypokalemia  - supplemented and within normal limits this AM  Pancytopenia  - likely sequela of alcohol induced bone marrow damage  - Hg drop since admission also likely dilutional  - no signs of active bleed, CBC in AM  Transaminitis  - secondary to alcohol abuse, trending down   Consultants:  None Procedures/Studies:  None Antibiotics:  None Code Status: Full  Family  Communication: Pt at bedside  Disposition Plan: Keep in SDU today   HPI/Subjective: No events overnight.   Objective: Filed Vitals:   05/16/13 0400 05/16/13 0500 05/16/13 0524 05/16/13 0600  BP:   133/84 139/102  Pulse: 79 83 77 80  Temp: 99.4 F (37.4 C)     TempSrc: Oral     Resp: 26 18  17   Height:      Weight: 92.3 kg (203 lb 7.8 oz)     SpO2: 97% 98%  96%    Intake/Output Summary (Last 24 hours) at 05/16/13 0846 Last data filed at 05/16/13 0600  Gross per 24 hour  Intake   2750 ml  Output   3630 ml  Net   -880 ml    Exam:   General:  Pt is alert, follows commands appropriately, not in acute distress  Cardiovascular: Regular rate and rhythm, S1/S2, no murmurs, no rubs, no gallops  Respiratory: Clear to auscultation bilaterally, no wheezing, no crackles, no rhonchi  Abdomen: Soft, non tender, non distended, bowel sounds present, no guarding  Extremities: No edema, pulses DP and PT palpable bilaterally  Neuro: Grossly nonfocal, with tremors   Data Reviewed: Basic Metabolic Panel:  Recent Labs Lab 05/10/13 2100 05/14/13 1245 05/15/13 0337  NA 138 136 140  K 4.0 3.4* 3.5  CL 90* 91* 102  CO2 19 25 22   GLUCOSE 91 112* 64*  BUN 17 9 8   CREATININE 1.03 0.64 0.63  CALCIUM 9.0 9.7 8.0*  MG  --   --  1.6   Liver Function Tests:  Recent Labs  Lab 05/14/13 1245 05/15/13 0337  AST 212* 157*  ALT 212* 166*  ALKPHOS 147* 106  BILITOT 0.4 0.3  PROT 7.2 5.5*  ALBUMIN 3.9 2.9*   No results found for this basename: LIPASE, AMYLASE,  in the last 168 hours No results found for this basename: AMMONIA,  in the last 168 hours CBC:  Recent Labs Lab 05/10/13 2100 05/14/13 1245 05/15/13 0337  WBC 12.4* 6.6 3.9*  NEUTROABS 11.0*  --   --   HGB 14.5 13.5 10.9*  HCT 42.9 39.4 32.0*  MCV 92.7 90.6 92.2  PLT 225 124* 62*   Cardiac Enzymes: No results found for this basename: CKTOTAL, CKMB, CKMBINDEX, TROPONINI,  in the last 168 hours BNP: No components  found with this basename: POCBNP,  CBG:  Recent Labs Lab 05/15/13 1547 05/15/13 2136 05/16/13 0039 05/16/13 0352 05/16/13 0754  GLUCAP 103* 201* 128* 88 243*    Recent Results (from the past 240 hour(s))  MRSA PCR SCREENING     Status: None   Collection Time    05/14/13  3:34 PM      Result Value Range Status   MRSA by PCR NEGATIVE  NEGATIVE Final   Comment:            The GeneXpert MRSA Assay (FDA     approved for NASAL specimens     only), is one component of a     comprehensive MRSA colonization     surveillance program. It is not     intended to diagnose MRSA     infection nor to guide or     monitor treatment for     MRSA infections.     Scheduled Meds: . antiseptic oral rinse  15 mL Mouth Rinse q12n4p  . chlorhexidine  15 mL Mouth Rinse BID  . enoxaparin (LOVENOX) injection  40 mg Subcutaneous Q24H  . folic acid  1 mg Oral Daily  . insulin aspart  0-9 Units Subcutaneous Q4H  . metoprolol tartrate  25 mg Oral BID  . multivitamin with minerals  1 tablet Oral Daily  . pantoprazole  40 mg Oral BID  . pneumococcal 23 valent vaccine  0.5 mL Intramuscular Tomorrow-1000  . QUEtiapine  100 mg Oral QHS  . thiamine  100 mg Oral Daily   Continuous Infusions: . sodium chloride 125 mL/hr at 05/16/13 0700     Debbora Presto, MD  [facility] Pager [phone]  If 7PM-7AM, please contact night-coverage [email or web address] Password [facility] 05/16/2013, 8:46 AM   LOS: 2 days

## 2013-05-16 NOTE — Clinical Social Work Note (Signed)
CSW attempted to see Pt due to ETOH abuse. CSW will see in am and hope Pt is more alert to complete assessment.   Doreen Salvage, LCSW ICU/Stepdown Clinical Social Worker [facility] Cell [phone] Hours 8am-1200pm M-F

## 2013-05-16 NOTE — Progress Notes (Signed)
Chaplain saw Jeffrey Black while rounding in ICU d/t re-admission, Jeffrey Black's statement about being non-compliant.  Provided emotional support with Jeffrey Black.   Jeffrey Black spoke about death of mother 03-28-2010?)  Jeffrey Black does not have support from his father, and is distraught about his father's girlfriend.  Described closeness with mother and distress over father's affairs.  Spoke about his mother's care as key in his having "good years" between 03-28-94 and 03/29/07.  Jeffrey Black spoke with chaplain about feeling estranged from himself - not able to do things that are joyful in his life.  Spoke about feeling as though he "has strength" and wishes to "continue to fight"  Chaplain will continue to follow for support Please page as needs arise.   Belva Crome, MDiv

## 2013-05-17 LAB — GLUCOSE, CAPILLARY
Glucose-Capillary: 104 mg/dL — ABNORMAL HIGH (ref 70–99)
Glucose-Capillary: 123 mg/dL — ABNORMAL HIGH (ref 70–99)

## 2013-05-17 LAB — BASIC METABOLIC PANEL
BUN: 4 mg/dL — ABNORMAL LOW (ref 6–23)
CO2: 27 mEq/L (ref 19–32)
Calcium: 9 mg/dL (ref 8.4–10.5)
Chloride: 99 mEq/L (ref 96–112)
Creatinine, Ser: 0.55 mg/dL (ref 0.50–1.35)

## 2013-05-17 LAB — CBC
HCT: 33.5 % — ABNORMAL LOW (ref 39.0–52.0)
MCH: 30.9 pg (ref 26.0–34.0)
MCV: 90.1 fL (ref 78.0–100.0)
Platelets: 60 10*3/uL — ABNORMAL LOW (ref 150–400)
RBC: 3.72 MIL/uL — ABNORMAL LOW (ref 4.22–5.81)
WBC: 2.5 10*3/uL — ABNORMAL LOW (ref 4.0–10.5)

## 2013-05-17 MED ORDER — LORAZEPAM 2 MG/ML IJ SOLN
1.0000 mg | Freq: Two times a day (BID) | INTRAMUSCULAR | Status: DC
  Administered 2013-05-17 – 2013-05-18 (×2): 1 mg via INTRAVENOUS
  Filled 2013-05-17: qty 1

## 2013-05-17 MED ORDER — NICOTINE 21 MG/24HR TD PT24
21.0000 mg | MEDICATED_PATCH | Freq: Every day | TRANSDERMAL | Status: DC
  Administered 2013-05-18: 21 mg via TRANSDERMAL
  Filled 2013-05-17 (×2): qty 1

## 2013-05-17 MED ORDER — POTASSIUM CHLORIDE 20 MEQ/15ML (10%) PO LIQD
40.0000 meq | Freq: Once | ORAL | Status: AC
  Administered 2013-05-17: 40 meq via ORAL
  Filled 2013-05-17: qty 30

## 2013-05-17 MED ORDER — POTASSIUM CHLORIDE CRYS ER 20 MEQ PO TBCR: 40.0000 meq | EXTENDED_RELEASE_TABLET | Freq: Three times a day (TID) | ORAL | Status: DC

## 2013-05-17 MED ORDER — HYDRALAZINE HCL 20 MG/ML IJ SOLN
10.0000 mg | Freq: Once | INTRAMUSCULAR | Status: AC
  Administered 2013-05-17: 10 mg via INTRAVENOUS
  Filled 2013-05-17: qty 1

## 2013-05-17 MED ORDER — POTASSIUM CHLORIDE CRYS ER 20 MEQ PO TBCR
40.0000 meq | EXTENDED_RELEASE_TABLET | Freq: Once | ORAL | Status: AC
  Administered 2013-05-17: 40 meq via ORAL
  Filled 2013-05-17: qty 2

## 2013-05-17 MED ORDER — POTASSIUM CHLORIDE CRYS ER 20 MEQ PO TBCR
20.0000 meq | EXTENDED_RELEASE_TABLET | Freq: Once | ORAL | Status: AC
  Administered 2013-05-17: 20 meq via ORAL
  Filled 2013-05-17: qty 1

## 2013-05-17 NOTE — Progress Notes (Signed)
Assumed care for patient. No change from ICU assessment. Will continue to monitor.

## 2013-05-17 NOTE — Progress Notes (Signed)
Patient ID: Jeffrey Black, male   DOB: [birth year], 52 y.o.   MRN: [id number] [facility] PROGRESS NOTE  Jeffrey Black FAO:[id number] DOB: 1960/12/19 DOA: 05/14/2013 PCP: No primary provider on file.  HPI:  Patient is 52 year old male with long history of alcohol abuse, admitted to [facility] long hospital for acute intoxication. Please note that patient was not able to provide history at the time of admission but family present at bedside and provide useful information. Patient has long history of depression and has been admitted to rehabilitation facility in the past for 30 days duration, relapses soon after discharge. There is no reported fevers or chills, no shortness of breath or chest pain, no specific abdominal or urinary concerns at the time of admission.   In emergency department, patient with altered mental status and unable to provide history, blood alcohol level greater than 600, transaminase is elevated. 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   Objective: Filed Vitals:   05/17/13 0357 05/17/13 0500 05/17/13 0600 05/17/13 0800  BP:  122/101 159/91 137/100  Pulse:  92  114  Temp: 97.8 F (36.6 C)     TempSrc: Oral     Resp:  18 13   Height:      Weight: 87.4 kg (192 lb 10.9 oz)     SpO2:  99%      Intake/Output Summary (Last 24 hours) at 05/17/13 0841 Last data filed at 05/17/13 6962  Gross per 24 hour  Intake   2857 ml  Output   9200 ml  Net  -6343 ml    Exam:   General:  Pt is alert, follows commands appropriately, not in acute distress  Cardiovascular: Regular rate and rhythm, S1/S2, no murmurs, no rubs, no gallops  Respiratory: Clear to auscultation bilaterally, no wheezing, no crackles, no rhonchi  Abdomen: Soft, non tender, non distended, bowel sounds present, no guarding  Extremities: No edema, pulses DP and PT palpable bilaterally  Neuro: Grossly nonfocal  Data Reviewed: Basic Metabolic Panel:  Recent Labs Lab 05/10/13 2100 05/14/13 1245 05/15/13 0337 05/16/13 0855 05/17/13 0335  NA 138 136 140 131* 137  K 4.0 3.4* 3.5 3.0* 2.9*  CL 90* 91* 102 94* 99  CO2 19 25 22 27 27   GLUCOSE 91 112* 64* 111* 107*  BUN 17 9 8  4* 4*  CREATININE 1.03 0.64 0.63 0.54 0.55  CALCIUM 9.0 9.7 8.0* 8.3* 9.0  MG  --   --  1.6  --   --    Liver Function Tests:  Recent Labs Lab 05/14/13 1245 05/15/13 0337 05/16/13 0855  AST 212* 157* 80*  ALT 212* 166* 114*  ALKPHOS 147* 106 100  BILITOT 0.4 0.3 0.4  PROT 7.2 5.5* 5.6*  ALBUMIN 3.9 2.9* 2.8*   CBC:  Recent Labs Lab 05/10/13 2100 05/14/13 1245 05/15/13 0337 05/16/13 0855 05/17/13 0335  WBC 12.4* 6.6 3.9* 3.0* 2.5*  NEUTROABS 11.0*  --   --   --   --   HGB 14.5 13.5 10.9* 10.9* 11.5*  HCT 42.9 39.4 32.0* 31.6* 33.5*  MCV 92.7 90.6 92.2 89.5 90.1  PLT 225 124* 62* 58* 60*    CBG:  Recent Labs Lab 05/16/13 1540 05/16/13 1912  05/16/13 2310 05/17/13 0337 05/17/13 0734  GLUCAP 145* 146* 171* 104* 123*    Recent Results (from the past 240 hour(s))  MRSA PCR SCREENING     Status: None   Collection Time    05/14/13  3:34 PM      Result Value Range Status   MRSA by PCR NEGATIVE  NEGATIVE Final   Comment:            The GeneXpert MRSA Assay (FDA     approved for NASAL specimens     only), is one component of a     comprehensive MRSA colonization     surveillance program. It is not     intended to diagnose MRSA     infection nor to guide or     monitor treatment for     MRSA infections.     Scheduled Meds: . antiseptic oral rinse  15 mL Mouth Rinse q12n4p  . chlorhexidine  15 mL Mouth Rinse BID  . enoxaparin (LOVENOX) injection  40 mg Subcutaneous Q24H  . folic acid  1 mg Oral Daily  . insulin aspart  0-9 Units Subcutaneous Q4H  . LORazepam  1 mg Intravenous Q6H  . metoprolol tartrate  25 mg Oral BID  . multivitamin with minerals  1 tablet Oral Daily  . pantoprazole  40 mg Oral BID  . potassium chloride  20 mEq Oral Once  . QUEtiapine  100 mg Oral QHS  . thiamine  100 mg Oral Daily   Continuous Infusions: . sodium chloride 75 mL/hr at 05/17/13 0400     Debbora Presto, MD  [facility] Pager [phone]  If 7PM-7AM, please contact night-coverage [email or web address] Password [email or web address] 05/17/2013, 8:41 AM   LOS: 3 days

## 2013-05-17 NOTE — Clinical Social Work Note (Signed)
CSW met very briefly with Pt. He had just gotten his lunch and made the comment "just when you get your food, everyone comes to see you." CSW offered to try again later today or in the am. CSW explained that the consult was for substance abuse and Pt replied "come in the am". CSW will follow and assess in the am.  Doreen Salvage, LCSW ICU/Stepdown Clinical Social Worker [facility] Cell [phone] Hours 8am-1200pm M-F

## 2013-05-17 NOTE — Progress Notes (Addendum)
Pt addressed several  Concerns tonight: Blood work to check cholesterol level, Weakness, and financial concerns to get medications when discharged.   Called floor coverage provider for elevated DBP.

## 2013-05-18 DIAGNOSIS — D61818 Other pancytopenia: Secondary | ICD-10-CM

## 2013-05-18 LAB — CBC
HCT: 34.4 % — ABNORMAL LOW (ref 39.0–52.0)
Hemoglobin: 11.7 g/dL — ABNORMAL LOW (ref 13.0–17.0)
RBC: 3.77 MIL/uL — ABNORMAL LOW (ref 4.22–5.81)
WBC: 4.4 10*3/uL (ref 4.0–10.5)

## 2013-05-18 LAB — GLUCOSE, CAPILLARY
Glucose-Capillary: 101 mg/dL — ABNORMAL HIGH (ref 70–99)
Glucose-Capillary: 98 mg/dL (ref 70–99)
Glucose-Capillary: 109 mg/dL — ABNORMAL HIGH (ref 70–99)

## 2013-05-18 LAB — BASIC METABOLIC PANEL
BUN: 4 mg/dL — ABNORMAL LOW (ref 6–23)
CO2: 24 mEq/L (ref 19–32)
Chloride: 102 mEq/L (ref 96–112)
GFR calc non Af Amer: >90 mL/min (ref >90)
Glucose, Bld: 111 mg/dL — ABNORMAL HIGH (ref 70–99)
Potassium: 3.7 mEq/L (ref 3.5–5.1)
Sodium: 135 mEq/L (ref 135–145)

## 2013-05-18 MED ORDER — ADULT MULTIVITAMIN W/MINERALS CH: 1.0000 | ORAL_TABLET | Freq: Every day | ORAL | Status: DC

## 2013-05-18 MED ORDER — OXYCODONE HCL 5 MG PO TABS
5.0000 mg | ORAL_TABLET | Freq: Once | ORAL | Status: AC
  Administered 2013-05-18: 5 mg via ORAL
  Filled 2013-05-18: qty 1

## 2013-05-18 NOTE — Discharge Summary (Signed)
Physician Discharge Summary  Jeffrey Black ZOX:[id number] DOB: 1961-07-24 DOA: 05/14/2013  PCP: No primary provider on file.  Admit date: 05/14/2013 Discharge date: 05/18/2013  Time spent: Greater than 30 minutes  Recommendations for Outpatient Follow-up:  -Advised to follow up with PCP in 2 weeks. -Was provided with OP resources for his ETOH abuse as he has refused inpatient rehab.   Discharge Diagnoses:  Active Problems:   Hypokalemia   Alcohol intoxication   Other pancytopenia   Discharge Condition: Stable  Filed Weights   05/17/13 0357 05/17/13 1422 05/18/13 0455  Weight: 87.4 kg (192 lb 10.9 oz) 90.1 kg (198 lb 10.2 oz) 87.6 kg (193 lb 2 oz)    History of present illness:  Patient is a 52 year old male with long history of alcohol abuse, admitted to [facility] long hospital for acute intoxication. Please note that patient was not able to provide history at the time of admission but family present at bedside and provide useful information. Patient has long history of depression and has been admitted to rehabilitation facility in the past for 30 days duration, relapses soon after discharge. There is no reported fevers or chills, no shortness of breath or chest pain, no specific abdominal or urinary concerns at the time of admission.  In emergency department, patient with altered mental status and unable to provide history, blood alcohol level greater than 600, transaminase is elevated. TRH asked to admit for further management   Hospital Course:  Acute Alcohol Intoxication -ETOH level 610 on admission. -No signs of withdrawals this admission. -Was maintained on a CIWA protocol. -Patient is refusing inpatient ETOH rehab and would prefer to be discharged home today. -SW has provided him with OP resources.  Pancytopenia -From ETOH-induced bone marrow suppression.  -Stable. -Refrain from using heparin-products during future  admissions.  Hypokalemia -Repleted.  Procedures:  None   Consultations:  None  Discharge Instructions      Discharge Orders   Future Orders Complete By Expires     Discontinue IV  As directed     Increase activity slowly  As directed         Medication List    STOP taking these medications       oxyCODONE 5 MG immediate release tablet  Commonly known as:  Oxy IR/ROXICODONE      TAKE these medications       escitalopram 10 MG tablet  Commonly known as:  LEXAPRO  Take 1 tablet (10 mg total) by mouth daily. For depression     folic acid 1 MG tablet  Commonly known as:  FOLVITE  Take 1 tablet (1 mg total) by mouth daily.     gabapentin 300 MG capsule  Commonly known as:  NEURONTIN  Take 1 capsule (300 mg total) by mouth 3 (three) times daily. For anxiety/pain control     metoprolol tartrate 25 MG tablet  Commonly known as:  LOPRESSOR  Take 1 tablet (25 mg total) by mouth 2 (two) times daily.     multivitamin with minerals Tabs  Take 1 tablet by mouth daily.     pantoprazole 40 MG tablet  Commonly known as:  PROTONIX  Take 1 tablet (40 mg total) by mouth 2 (two) times daily.     QUEtiapine 100 MG tablet  Commonly known as:  SEROQUEL  Take 1 tablet (100 mg total) by mouth at bedtime. For mood control     thiamine 100 MG tablet  Take 1 tablet (100 mg total) by mouth  daily.       No Known Allergies Follow-up Information   Schedule an appointment as soon as possible for a visit in 2 weeks to follow up. (With your regular provider)        The results of significant diagnostics from this hospitalization (including imaging, microbiology, ancillary and laboratory) are listed below for reference.    Significant Diagnostic Studies: Dg Ribs Bilateral  04/30/2013   *RADIOLOGY REPORT*  Clinical Data: Fall.  Bilateral chest wall pain.  BILATERAL RIBS - 3+ VIEW  Comparison: 04/29/2013  Findings: A minimally-displaced right anterior sixth rib fracture is seen  which appears acute. No acute left-sided rib fractures are identified.  A subacute healing nondisplaced rib fractures are seen involving the right lateral eighth and ninth ribs, and the posterior 11th rib.  There is also an incompletely healed left posterior 11th rib fracture which shows some callus formation.  No evidence of pneumothorax or hemothorax.  Atelectasis is noted in the right lower lung zone.  IMPRESSION:  1.  Acute mildly displaced right anterior sixth rib fracture. 2.  Other bilateral subacute, healing rib fractures, as described above.   Original Report Authenticated By: Myles Rosenthal, M.D.   Ct Head Wo Contrast  04/29/2013   *RADIOLOGY REPORT*  Clinical Data:  Fall  CT HEAD WITHOUT CONTRAST CT CERVICAL SPINE WITHOUT CONTRAST  Technique:  Multidetector CT imaging of the head and cervical spine was performed following the standard protocol without intravenous contrast.  Multiplanar CT image reconstructions of the cervical spine were also generated.  Comparison:  CT 04/22/2013  CT HEAD  Findings: Ventricle size is normal.  Mild atrophy for age. Negative for hemorrhage mass or infarction.  No change from the prior study.  Negative for skull fracture.  IMPRESSION: Mild atrophy without acute abnormality.  CT CERVICAL SPINE  Findings: Image quality degraded by patient motion.  Allowing for this no fracture is identified.  There is mild cervical disc degeneration.  IMPRESSION: Image quality degraded by motion.  No fracture or malalignment is identified.   Original Report Authenticated By: Janeece Riggers, M.D.   Ct Head Wo Contrast  04/22/2013   *RADIOLOGY REPORT*  Clinical Data:  Fall down stairs, intoxicated  CT HEAD WITHOUT CONTRAST CT CERVICAL SPINE WITHOUT CONTRAST  Technique:  Multidetector CT imaging of the head and cervical spine was performed following the standard protocol without intravenous contrast.  Multiplanar CT image reconstructions of the cervical spine were also generated.  Comparison:   12/02/2008  CT HEAD  Findings: No evidence of parenchymal hemorrhage or extra-axial fluid collection. No mass lesion, mass effect, or midline shift.  No CT evidence of acute infarction.  Mild subcortical white matter and periventricular small vessel ischemic changes.  Cerebral volume is age appropriate.  No ventriculomegaly.  The visualized paranasal sinuses are essentially clear. The mastoid air cells are unopacified.  No evidence of calvarial fracture.  IMPRESSION: No evidence of acute intracranial abnormality.  CT CERVICAL SPINE  Findings: Straightening of the cervical spine, likely positional.  No evidence of fracture or dislocation.  Vertebral body heights are maintained.  The dens appears intact.  No prevertebral soft tissue swelling.  Mild multilevel degenerative changes.  Visualized thyroid is unremarkable.  Small bilateral cervical lymph nodes, likely reactive.  Visualized lungs are notable for mild right apical pleural parenchymal scarring.  IMPRESSION: No evidence of traumatic injury to the cervical spine.  Mild multilevel degenerative changes.   Original Report Authenticated By: Charline Bills, M.D.   Ct Cervical Spine  Wo Contrast  04/29/2013   *RADIOLOGY REPORT*  Clinical Data:  Fall  CT HEAD WITHOUT CONTRAST CT CERVICAL SPINE WITHOUT CONTRAST  Technique:  Multidetector CT imaging of the head and cervical spine was performed following the standard protocol without intravenous contrast.  Multiplanar CT image reconstructions of the cervical spine were also generated.  Comparison:  CT 04/22/2013  CT HEAD  Findings: Ventricle size is normal.  Mild atrophy for age. Negative for hemorrhage mass or infarction.  No change from the prior study.  Negative for skull fracture.  IMPRESSION: Mild atrophy without acute abnormality.  CT CERVICAL SPINE  Findings: Image quality degraded by patient motion.  Allowing for this no fracture is identified.  There is mild cervical disc degeneration.  IMPRESSION: Image  quality degraded by motion.  No fracture or malalignment is identified.   Original Report Authenticated By: Janeece Riggers, M.D.   Ct Cervical Spine Wo Contrast  04/22/2013   *RADIOLOGY REPORT*  Clinical Data:  Fall down stairs, intoxicated  CT HEAD WITHOUT CONTRAST CT CERVICAL SPINE WITHOUT CONTRAST  Technique:  Multidetector CT imaging of the head and cervical spine was performed following the standard protocol without intravenous contrast.  Multiplanar CT image reconstructions of the cervical spine were also generated.  Comparison:  12/02/2008  CT HEAD  Findings: No evidence of parenchymal hemorrhage or extra-axial fluid collection. No mass lesion, mass effect, or midline shift.  No CT evidence of acute infarction.  Mild subcortical white matter and periventricular small vessel ischemic changes.  Cerebral volume is age appropriate.  No ventriculomegaly.  The visualized paranasal sinuses are essentially clear. The mastoid air cells are unopacified.  No evidence of calvarial fracture.  IMPRESSION: No evidence of acute intracranial abnormality.  CT CERVICAL SPINE  Findings: Straightening of the cervical spine, likely positional.  No evidence of fracture or dislocation.  Vertebral body heights are maintained.  The dens appears intact.  No prevertebral soft tissue swelling.  Mild multilevel degenerative changes.  Visualized thyroid is unremarkable.  Small bilateral cervical lymph nodes, likely reactive.  Visualized lungs are notable for mild right apical pleural parenchymal scarring.  IMPRESSION: No evidence of traumatic injury to the cervical spine.  Mild multilevel degenerative changes.   Original Report Authenticated By: Charline Bills, M.D.   Dg Pelvis Portable  04/29/2013   *RADIOLOGY REPORT*  Clinical Data: 52 year old male status post fall.  Unresponsive. Pain.  PORTABLE PELVIS  Comparison: CT abdomen 02/25/2013.  Findings: Portable AP view the pelvis.  Femoral heads normally located.  Hip joint spaces  preserved. Bone mineralization is within normal limits.  Both proximal femurs appear grossly intact.  Pelvis intact.  Sacral ala and SI joints appear intact.  No acute fracture identified.  Negative visible bowel gas pattern.  IMPRESSION: No acute fracture or dislocation identified about the pelvis.  If there is localizing hip pain, recommend follow-up hip series.   Original Report Authenticated By: Erskine Speed, M.D.   Dg Chest Port 1 View  04/29/2013   *RADIOLOGY REPORT*  Clinical Data: History of fall.  Shortness of breath.  Weakness.  PORTABLE CHEST - 1 VIEW  Comparison: Chest x-ray 02/22/2013.  Findings: Lung volumes are low.  No pneumothorax.  Linear opacities in the lung bases bilaterally, favored to reflect subsegmental atelectasis.  No definite acute consolidative airspace disease.  No pleural effusions.  Crowding of the pulmonary vasculature, accentuated by low lung volumes, without frank pulmonary edema. Borderline cardiomegaly. The patient is rotated to the right on today's exam, resulting  in distortion of the mediastinal contours and reduced diagnostic sensitivity and specificity for mediastinal pathology.  IMPRESSION: 1.  Low lung volumes with bibasilar subsegmental atelectasis.   Original Report Authenticated By: Trudie Reed, M.D.    Microbiology: Recent Results (from the past 240 hour(s))  MRSA PCR SCREENING     Status: None   Collection Time    05/14/13  3:34 PM      Result Value Range Status   MRSA by PCR NEGATIVE  NEGATIVE Final   Comment:            The GeneXpert MRSA Assay (FDA     approved for NASAL specimens     only), is one component of a     comprehensive MRSA colonization     surveillance program. It is not     intended to diagnose MRSA     infection nor to guide or     monitor treatment for     MRSA infections.     Labs: Basic Metabolic Panel:  Recent Labs Lab 05/14/13 1245 05/15/13 0337 05/16/13 0855 05/17/13 0335 05/18/13 0359  NA 136 140 131* 137  135  K 3.4* 3.5 3.0* 2.9* 3.7  CL 91* 102 94* 99 102  CO2 25 22 27 27 24   GLUCOSE 112* 64* 111* 107* 111*  BUN 9 8 4* 4* 4*  CREATININE 0.64 0.63 0.54 0.55 0.57  CALCIUM 9.7 8.0* 8.3* 9.0 9.2  MG  --  1.6  --   --   --    Liver Function Tests:  Recent Labs Lab 05/14/13 1245 05/15/13 0337 05/16/13 0855  AST 212* 157* 80*  ALT 212* 166* 114*  ALKPHOS 147* 106 100  BILITOT 0.4 0.3 0.4  PROT 7.2 5.5* 5.6*  ALBUMIN 3.9 2.9* 2.8*   No results found for this basename: LIPASE, AMYLASE,  in the last 168 hours No results found for this basename: AMMONIA,  in the last 168 hours CBC:  Recent Labs Lab 05/14/13 1245 05/15/13 0337 05/16/13 0855 05/17/13 0335 05/18/13 0359  WBC 6.6 3.9* 3.0* 2.5* 4.4  HGB 13.5 10.9* 10.9* 11.5* 11.7*  HCT 39.4 32.0* 31.6* 33.5* 34.4*  MCV 90.6 92.2 89.5 90.1 91.2  PLT 124* 62* 58* 60* 72*   Cardiac Enzymes: No results found for this basename: CKTOTAL, CKMB, CKMBINDEX, TROPONINI,  in the last 168 hours BNP: BNP (last 3 results)  Recent Labs  11/08/12 0540  PROBNP 34.6   CBG:  Recent Labs Lab 05/17/13 1616 05/18/13 0006 05/18/13 0359 05/18/13 0752 05/18/13 1159  GLUCAP 126* 101* 101* 98 109*       Signed:  HERNANDEZ ACOSTA,ESTELA  [facility] Pager: [phone] 05/18/2013, 2:22 PM

## 2013-05-18 NOTE — Clinical Social Work Psychosocial (Signed)
Clinical Social Work Department BRIEF PSYCHOSOCIAL ASSESSMENT 05/18/2013  Patient:  Jeffrey Black, Jeffrey Black     Account Number:  [id number]     Admit date:  05/14/2013  Clinical Social Worker:  Jodelle Red  Date/Time:  05/18/2013 10:00 AM  Referred by:  Physician  Date Referred:  05/16/2013 Referred for  Substance Abuse   Other Referral:   Interview type:  Patient Other interview type:   CHART REVIEW    PSYCHOSOCIAL DATA Living Status:  ALONE Admitted from facility:   Level of care:   Primary support name:  [employer] Primary support relationship to patient:  SIBLING Degree of support available:   limited, main support is sister and his neice.  mother is deceased and was his main support.  pt is estranged from his son and daughter  father is local, but not very supportive    CURRENT CONCERNS Current Concerns  Financial Resources  Substance Abuse   Other Concerns:   possible inpt treatment need    SOCIAL WORK ASSESSMENT / PLAN CSW met with Pt at length to discuss his ETOH abuse. Pt described lengthy h/o of ETOH abuse and many years of attempting variety treatment options as well. Pt reports he was sober for many years and then relapsed after his mother's death. He is open to treatment currently, but is not sure about inpt as an option. He recently was d[facility]. He resides at an extended stay motel and does have some savings to assist with his daily needs, but reports this is running out. His last job was in Dec of 2013.  CSW attempted to educate Pt on the long term effects of ETOH abuse and also shared that his blood ETOH level was 610. CSW disclosed that was the highest that she knew of in someone that actually lived. Pt did not believe that it was that high and CSW showed him the results. This appeared to make an impact on him. He plans to consider tx and will discuss with his sister.   Assessment/plan status:  Psychosocial Support/Ongoing  Assessment of Needs Other assessment/ plan:   treatment for ETOH   Information/referral to community resources:   CSW provided list of resources for sub abuse.    PATIENT'S/FAMILY'S RESPONSE TO PLAN OF CARE: Pt now open to consider treatment. He prefers outpt, but appears to be realizing that his safest and best option is inpt currently. He plans to consider this and discuss with his sister. CSW will revisit in the am and further work on tx plan.   Doreen Salvage, LCSW ICU/Stepdown Clinical Social Worker [facility] Cell [phone] Hours 8am-1200pm M-F

## 2013-06-23 ENCOUNTER — Encounter (HOSPITAL_COMMUNITY): Payer: Self-pay | Admitting: *Deleted

## 2013-06-23 ENCOUNTER — Emergency Department (HOSPITAL_COMMUNITY): Payer: PRIVATE HEALTH INSURANCE

## 2013-06-23 ENCOUNTER — Emergency Department (HOSPITAL_COMMUNITY)
Admission: EM | Admit: 2013-06-23 | Discharge: 2013-06-24 | Discharge status: Against Medical Advice | Payer: PRIVATE HEALTH INSURANCE | Attending: Emergency Medicine | Admitting: Emergency Medicine

## 2013-06-23 DIAGNOSIS — Z872 Personal history of diseases of the skin and subcutaneous tissue: Secondary | ICD-10-CM | POA: Insufficient documentation

## 2013-06-23 DIAGNOSIS — Z859 Personal history of malignant neoplasm, unspecified: Secondary | ICD-10-CM | POA: Insufficient documentation

## 2013-06-23 DIAGNOSIS — Z79899 Other long term (current) drug therapy: Secondary | ICD-10-CM | POA: Insufficient documentation

## 2013-06-23 DIAGNOSIS — F1010 Alcohol abuse, uncomplicated: Secondary | ICD-10-CM | POA: Insufficient documentation

## 2013-06-23 DIAGNOSIS — IMO0002 Reserved for concepts with insufficient information to code with codable children: Secondary | ICD-10-CM

## 2013-06-23 DIAGNOSIS — F17200 Nicotine dependence, unspecified, uncomplicated: Secondary | ICD-10-CM | POA: Insufficient documentation

## 2013-06-23 LAB — POCT I-STAT, CHEM 8
HCT: 40 % (ref 39.0–52.0)
Hemoglobin: 13.6 g/dL (ref 13.0–17.0)
Potassium: 3.4 mEq/L — ABNORMAL LOW (ref 3.5–5.1)
Sodium: 148 mEq/L — ABNORMAL HIGH (ref 135–145)
TCO2: 23 mmol/L (ref 0–100)

## 2013-06-23 LAB — BASIC METABOLIC PANEL
BUN: 7 mg/dL (ref 6–23)
CO2: 24 mEq/L (ref 19–32)
Calcium: 8.4 mg/dL (ref 8.4–10.5)
Chloride: 112 mEq/L (ref 96–112)
Creatinine, Ser: 0.69 mg/dL (ref 0.50–1.35)
GFR calc Af Amer: >90 mL/min (ref >90)
GFR calc non Af Amer: >90 mL/min (ref >90)
Glucose, Bld: 90 mg/dL (ref 70–99)
Potassium: 3.8 mEq/L (ref 3.5–5.1)
Sodium: 149 mEq/L — ABNORMAL HIGH (ref 135–145)

## 2013-06-23 LAB — URINALYSIS, ROUTINE W REFLEX MICROSCOPIC
Glucose, UA: NEGATIVE mg/dL
Hgb urine dipstick: NEGATIVE
Leukocytes, UA: NEGATIVE
Protein, ur: NEGATIVE mg/dL
Specific Gravity, Urine: 1.013 (ref 1.005–1.030)
Urobilinogen, UA: 0.2 mg/dL (ref 0.0–1.0)

## 2013-06-23 LAB — CBC WITH DIFFERENTIAL/PLATELET
Basophils Relative: 0 % (ref 0–1)
Eosinophils Absolute: 0.1 10*3/uL (ref 0.0–0.7)
Hemoglobin: 13.5 g/dL (ref 13.0–17.0)
Lymphs Abs: 2.8 10*3/uL (ref 0.7–4.0)
Monocytes Relative: 7 % (ref 3–12)
Neutro Abs: 8.4 10*3/uL — ABNORMAL HIGH (ref 1.7–7.7)
Neutrophils Relative %: 69 % (ref 43–77)
Platelets: 305 10*3/uL (ref 150–400)
RBC: 4.21 MIL/uL — ABNORMAL LOW (ref 4.22–5.81)

## 2013-06-23 LAB — ACETAMINOPHEN LEVEL: Acetaminophen (Tylenol), Serum: <15 ug/mL (ref 10–30)

## 2013-06-23 LAB — COMPREHENSIVE METABOLIC PANEL
ALT: 28 U/L (ref 0–53)
Albumin: 3.7 g/dL (ref 3.5–5.2)
Alkaline Phosphatase: 78 U/L (ref 39–117)
BUN: 8 mg/dL (ref 6–23)
Calcium: 8.5 mg/dL (ref 8.4–10.5)
GFR calc Af Amer: >90 mL/min (ref >90)
Glucose, Bld: 102 mg/dL — ABNORMAL HIGH (ref 70–99)
Potassium: 3.4 mEq/L — ABNORMAL LOW (ref 3.5–5.1)
Sodium: 148 mEq/L — ABNORMAL HIGH (ref 135–145)
Total Protein: 6.8 g/dL (ref 6.0–8.3)

## 2013-06-23 LAB — SALICYLATE LEVEL: Salicylate Lvl: <2 mg/dL — ABNORMAL LOW (ref 2.8–20.0)

## 2013-06-23 LAB — AMMONIA: Ammonia: 46 umol/L (ref 11–60)

## 2013-06-23 LAB — CK: Total CK: 92 U/L (ref 7–232)

## 2013-06-23 LAB — RAPID URINE DRUG SCREEN, HOSP PERFORMED
Amphetamines: NOT DETECTED
Barbiturates: NOT DETECTED
Benzodiazepines: NOT DETECTED
Cocaine: NOT DETECTED
Opiates: NOT DETECTED
Tetrahydrocannabinol: NOT DETECTED

## 2013-06-23 LAB — CG4 I-STAT (LACTIC ACID): Lactic Acid, Venous: 1.92 mmol/L (ref 0.5–2.2)

## 2013-06-23 MED ORDER — NALOXONE HCL 0.4 MG/ML IJ SOLN
INTRAMUSCULAR | Status: AC
  Filled 2013-06-23: qty 2

## 2013-06-23 MED ORDER — NALOXONE HCL 0.4 MG/ML IJ SOLN
0.4000 mg | Freq: Once | INTRAMUSCULAR | Status: AC
  Administered 2013-06-23: 0.4 mg via INTRAVENOUS

## 2013-06-23 MED ORDER — NALOXONE HCL 0.4 MG/ML IJ SOLN
INTRAMUSCULAR | Status: AC
  Administered 2013-06-23: 0.8 mg
  Filled 2013-06-23: qty 3

## 2013-06-23 MED ORDER — SODIUM CHLORIDE 0.9 % IV BOLUS (SEPSIS)
1000.0000 mL | Freq: Once | INTRAVENOUS | Status: AC
  Administered 2013-06-23: 1000 mL via INTRAVENOUS

## 2013-06-23 MED ORDER — SODIUM CHLORIDE 0.9 % IV BOLUS (SEPSIS): 1000.0000 mL | Freq: Once | INTRAVENOUS | Status: DC

## 2013-06-23 MED ORDER — NALOXONE HCL 1 MG/ML IJ SOLN
0.8000 mg | Freq: Once | INTRAMUSCULAR | Status: AC
  Administered 2013-06-23: 0.8 mg via INTRAVENOUS

## 2013-06-23 NOTE — ED Provider Notes (Signed)
CSN: [id number]     Arrival date & time 06/23/13  1753 History     First MD Initiated Contact with Patient 06/23/13 1825     Chief Complaint  Patient presents with  . Altered Mental Status   (Consider location/radiation/quality/duration/timing/severity/associated sxs/prior Treatment) Patient is a 52 y.o. male presenting with altered mental status. The history is provided by the EMS personnel.  Altered Mental Status Presenting symptoms: unresponsiveness   Severity:  Severe Most recent episode:  Today Episode history:  Single Timing:  Constant Progression:  Unchanged Chronicity:  New Context: alcohol use (smelled of ETOH for EMS)     Past Medical History  Diagnosis Date  . ETOH abuse   . Psoriasis   . Cancer     Mother and sister unknown type of cancer   Past Surgical History  Procedure Laterality Date  . Left 4th finger surgery    . Esophagogastroduodenoscopy N/A 05/03/2013    Procedure: ESOPHAGOGASTRODUODENOSCOPY (EGD);  Surgeon: Meryl Dare, MD;  Location: [facility];  Service: Endoscopy;  Laterality: N/A;   Family History  Problem Relation Age of Onset  . Stroke Father   . Diabetes type II Mother   . Cancer - Colon Other    History  Substance Use Topics  . Smoking status: Current Every Day Smoker -- 1.00 packs/day for 20 years    Types: Cigarettes  . Smokeless tobacco: Never Used  . Alcohol Use: Yes     Comment: drinks 1 pt per day    Review of Systems  Unable to perform ROS: Patient unresponsive  Psychiatric/Behavioral: Positive for altered mental status.    Allergies  Review of patient's allergies indicates no known allergies.  Home Medications   Current Outpatient Rx  Name  Route  Sig  Dispense  Refill  . escitalopram (LEXAPRO) 10 MG tablet   Oral   Take 1 tablet (10 mg total) by mouth daily. For depression   30 tablet   0   . folic acid (FOLVITE) 1 MG tablet   Oral   Take 1 tablet (1 mg total) by mouth daily.   30 tablet   0   .  gabapentin (NEURONTIN) 300 MG capsule   Oral   Take 1 capsule (300 mg total) by mouth 3 (three) times daily. For anxiety/pain control   90 capsule   0   . metoprolol tartrate (LOPRESSOR) 25 MG tablet   Oral   Take 1 tablet (25 mg total) by mouth 2 (two) times daily.   60 tablet   0   . Multiple Vitamin (MULTIVITAMIN WITH MINERALS) TABS   Oral   Take 1 tablet by mouth daily.         . pantoprazole (PROTONIX) 40 MG tablet   Oral   Take 1 tablet (40 mg total) by mouth 2 (two) times daily.   60 tablet   4   . QUEtiapine (SEROQUEL) 100 MG tablet   Oral   Take 1 tablet (100 mg total) by mouth at bedtime. For mood control   30 tablet   0   . thiamine 100 MG tablet   Oral   Take 1 tablet (100 mg total) by mouth daily.   30 tablet   0    BP 126/86  Pulse 114  Temp(Src) 98.2 F (36.8 C)  Resp 18  SpO2 100% Physical Exam  Nursing note and vitals reviewed. Constitutional: He appears well-developed and well-nourished. He appears lethargic.  HENT:  Head: Normocephalic and atraumatic.  Right Ear: External ear normal.  Left Ear: External ear normal.  Nose: Nose normal.  Eyes: Pupils are equal, round, and reactive to light. Right eye exhibits no discharge. Left eye exhibits no discharge.  Neck: Neck supple.  Cardiovascular: Normal rate, regular rhythm, normal heart sounds and intact distal pulses.   Pulmonary/Chest: Effort normal and breath sounds normal.  Abdominal: Soft. There is no tenderness.  Musculoskeletal: He exhibits no edema.  Neurological: He appears lethargic.  Moans and moves all 4 extremities to pain. Does not answer questions or respond to verbal stimuli  Skin: Skin is warm and dry.    ED Course   Procedures (including critical care time)  Labs Reviewed  COMPREHENSIVE METABOLIC PANEL - Abnormal; Notable for the following:    Sodium 148 (*)    Potassium 3.4 (*)    Glucose, Bld 102 (*)    Total Bilirubin 0.1 (*)    All other components within normal  limits  CBC WITH DIFFERENTIAL - Abnormal; Notable for the following:    WBC 12.2 (*)    RBC 4.21 (*)    Neutro Abs 8.4 (*)    All other components within normal limits  ETHANOL - Abnormal; Notable for the following:    Alcohol, Ethyl (B) 455 (*)    All other components within normal limits  SALICYLATE LEVEL - Abnormal; Notable for the following:    Salicylate Lvl <2.0 (*)    All other components within normal limits  POCT I-STAT, CHEM 8 - Abnormal; Notable for the following:    Sodium 148 (*)    Potassium 3.4 (*)    Creatinine, Ser 1.60 (*)    Glucose, Bld 104 (*)    Calcium, Ion 1.08 (*)    All other components within normal limits  GLUCOSE, CAPILLARY  URINALYSIS, ROUTINE W REFLEX MICROSCOPIC  ACETAMINOPHEN LEVEL  AMMONIA  CK  URINE RAPID DRUG SCREEN (HOSP PERFORMED)  CG4 I-STAT (LACTIC ACID)   Ct Head Wo Contrast  06/23/2013   *RADIOLOGY REPORT*  Clinical Data: Altered mental status.  Found unresponsive.  CT HEAD WITHOUT CONTRAST  Technique:  Contiguous axial images were obtained from the base of the skull through the vertex without contrast.  Comparison: 04/29/2013  Findings: No acute intracranial abnormality.  Specifically, no hemorrhage, hydrocephalus, mass lesion, acute infarction, or significant intracranial injury.  No acute calvarial abnormality. Visualized paranasal sinuses and mastoids clear.  Orbital soft tissues unremarkable.  IMPRESSION: No acute intracranial abnormality.   Original Report Authenticated By: Charlett Nose, M.D.   Dg Chest Port 1 View  06/23/2013   *RADIOLOGY REPORT*  Clinical Data: Altered mental status.  Unresponsive.  PORTABLE CHEST - 1 VIEW  Comparison: 04/30/2013  Findings: Low lung volumes.  Vascular crowding.  Mild cardiomegaly and bibasilar atelectasis.  No effusions.  No acute bony abnormality.  IMPRESSION: Cardiomegaly, vascular congestion.  Low lung volumes with bibasilar atelectasis.   Original Report Authenticated By: Charlett Nose, M.D.   1.  Intoxication     MDM  52 year old male brought to the ED by EMS for unresponsiveness. Per EMS, bystanders stated that patient go to the store and was normal-appearing and then drove back to his living facility. They noted him driving back but that did not seem to 30 minutes they went to check on him. When they found him he was noted to be unresponsive in his car. Patient had minimal response to 2 mg of Narcan total. He progressively became more and more responsive throughout his ED stay,  which combined with his labs and imaging is consistent with alcohol intoxication as the cause of his altered mental status. Care transferred with patient continuing to sober but not yet able to be safely discharged. His sodium is mildly elevated, but this is most c/w dehydration given he had extensive intoxication along with being out in the summer heat. Will hydrate.  Audree Camel, MD 06/23/13 [phone]

## 2013-06-23 NOTE — ED Notes (Signed)
Pt found unresponsive in his car by friends (in there approx 30 min). Per EMS - Pt not responding to painful stimuli, +ETOH.

## 2013-06-23 NOTE — ED Notes (Signed)
HX of hospitalization for ETOH poisoning. Very delayed (approx 5 sec) response to pain stimuli across nail bed no stimuli with vigorous sternal rub

## 2013-06-23 NOTE — ED Notes (Signed)
Critical ETOH of 455 given to Dr Criss Alvine

## 2013-06-24 ENCOUNTER — Telehealth (HOSPITAL_COMMUNITY): Payer: Self-pay | Admitting: Licensed Clinical Social Worker

## 2013-06-24 ENCOUNTER — Emergency Department (HOSPITAL_COMMUNITY)
Admission: EM | Admit: 2013-06-24 | Discharge: 2013-06-25 | Disposition: A | Discharge status: Other Acute Inpatient Hospital | Payer: PRIVATE HEALTH INSURANCE | Attending: Emergency Medicine | Admitting: Emergency Medicine

## 2013-06-24 ENCOUNTER — Encounter (HOSPITAL_COMMUNITY): Payer: Self-pay | Admitting: Emergency Medicine

## 2013-06-24 DIAGNOSIS — F10239 Alcohol dependence with withdrawal, unspecified: Secondary | ICD-10-CM | POA: Diagnosis present

## 2013-06-24 DIAGNOSIS — F1020 Alcohol dependence, uncomplicated: Secondary | ICD-10-CM

## 2013-06-24 DIAGNOSIS — F10229 Alcohol dependence with intoxication, unspecified: Secondary | ICD-10-CM | POA: Insufficient documentation

## 2013-06-24 DIAGNOSIS — F17200 Nicotine dependence, unspecified, uncomplicated: Secondary | ICD-10-CM | POA: Insufficient documentation

## 2013-06-24 DIAGNOSIS — Z859 Personal history of malignant neoplasm, unspecified: Secondary | ICD-10-CM | POA: Insufficient documentation

## 2013-06-24 DIAGNOSIS — R4789 Other speech disturbances: Secondary | ICD-10-CM | POA: Insufficient documentation

## 2013-06-24 LAB — CBC WITH DIFFERENTIAL/PLATELET
Basophils Absolute: 0.1 10*3/uL (ref 0.0–0.1)
Eosinophils Relative: 0 % (ref 0–5)
Lymphocytes Relative: 15 % (ref 12–46)
MCV: 93.3 fL (ref 78.0–100.0)
Neutrophils Relative %: 78 % — ABNORMAL HIGH (ref 43–77)
Platelets: 342 10*3/uL (ref 150–400)
RBC: 4.15 MIL/uL — ABNORMAL LOW (ref 4.22–5.81)
RDW: 14.1 % (ref 11.5–15.5)
WBC: 11.3 10*3/uL — ABNORMAL HIGH (ref 4.0–10.5)

## 2013-06-24 LAB — COMPREHENSIVE METABOLIC PANEL
ALT: 37 U/L (ref 0–53)
AST: 26 U/L (ref 0–37)
Albumin: 3.6 g/dL (ref 3.5–5.2)
Alkaline Phosphatase: 78 U/L (ref 39–117)
BUN: 8 mg/dL (ref 6–23)
CO2: 25 mEq/L (ref 19–32)
Calcium: 8.8 mg/dL (ref 8.4–10.5)
Chloride: 104 mEq/L (ref 96–112)
Creatinine, Ser: 0.74 mg/dL (ref 0.50–1.35)
GFR calc Af Amer: >90 mL/min (ref >90)
GFR calc non Af Amer: >90 mL/min (ref >90)
Glucose, Bld: 159 mg/dL — ABNORMAL HIGH (ref 70–99)
Potassium: 3.3 mEq/L — ABNORMAL LOW (ref 3.5–5.1)
Sodium: 141 mEq/L (ref 135–145)
Total Bilirubin: 0.2 mg/dL — ABNORMAL LOW (ref 0.3–1.2)
Total Protein: 6.5 g/dL (ref 6.0–8.3)

## 2013-06-24 LAB — ETHANOL: Alcohol, Ethyl (B): 383 mg/dL — ABNORMAL HIGH (ref 0–11)

## 2013-06-24 MED ORDER — LORAZEPAM 1 MG PO TABS
0.0000 mg | ORAL_TABLET | Freq: Four times a day (QID) | ORAL | Status: DC
  Administered 2013-06-24: 1 mg via ORAL
  Administered 2013-06-25: 2 mg via ORAL
  Filled 2013-06-24: qty 1
  Filled 2013-06-24: qty 2

## 2013-06-24 MED ORDER — METOPROLOL TARTRATE 25 MG PO TABS
25.0000 mg | ORAL_TABLET | Freq: Two times a day (BID) | ORAL | Status: DC
  Administered 2013-06-24: 25 mg via ORAL
  Filled 2013-06-24: qty 1

## 2013-06-24 MED ORDER — VITAMIN B-1 100 MG PO TABS
100.0000 mg | ORAL_TABLET | Freq: Every day | ORAL | Status: DC
  Administered 2013-06-24: 100 mg via ORAL
  Filled 2013-06-24: qty 1

## 2013-06-24 MED ORDER — POTASSIUM CHLORIDE CRYS ER 20 MEQ PO TBCR
20.0000 meq | EXTENDED_RELEASE_TABLET | Freq: Two times a day (BID) | ORAL | Status: DC
  Administered 2013-06-24: 20 meq via ORAL
  Filled 2013-06-24 (×3): qty 1

## 2013-06-24 MED ORDER — LORAZEPAM 1 MG PO TABS: 0.0000 mg | ORAL_TABLET | Freq: Two times a day (BID) | ORAL | Status: DC

## 2013-06-24 MED ORDER — SODIUM CHLORIDE 0.9 % IV BOLUS (SEPSIS)
1000.0000 mL | Freq: Once | INTRAVENOUS | Status: AC
  Administered 2013-06-24: 1000 mL via INTRAVENOUS

## 2013-06-24 MED ORDER — ALUM & MAG HYDROXIDE-SIMETH 200-200-20 MG/5ML PO SUSP: 30.0000 mL | ORAL | Status: DC | PRN

## 2013-06-24 MED ORDER — DIPHENHYDRAMINE HCL 50 MG/ML IJ SOLN
25.0000 mg | Freq: Once | INTRAMUSCULAR | Status: AC
  Administered 2013-06-24: 25 mg via INTRAMUSCULAR
  Filled 2013-06-24: qty 1

## 2013-06-24 MED ORDER — ZOLPIDEM TARTRATE 5 MG PO TABS: 5.0000 mg | ORAL_TABLET | Freq: Every evening | ORAL | Status: DC | PRN

## 2013-06-24 MED ORDER — HALOPERIDOL LACTATE 5 MG/ML IJ SOLN
5.0000 mg | Freq: Once | INTRAMUSCULAR | Status: AC
  Administered 2013-06-24: 5 mg via INTRAMUSCULAR
  Filled 2013-06-24: qty 1

## 2013-06-24 MED ORDER — QUETIAPINE FUMARATE 100 MG PO TABS
100.0000 mg | ORAL_TABLET | Freq: Every day | ORAL | Status: DC
  Administered 2013-06-24: 100 mg via ORAL
  Filled 2013-06-24: qty 1

## 2013-06-24 MED ORDER — THIAMINE HCL 100 MG/ML IJ SOLN: 100.0000 mg | Freq: Every day | INTRAMUSCULAR | Status: DC

## 2013-06-24 MED ORDER — ONDANSETRON HCL 4 MG PO TABS: 4.0000 mg | ORAL_TABLET | Freq: Three times a day (TID) | ORAL | Status: DC | PRN

## 2013-06-24 MED ORDER — POTASSIUM CHLORIDE 20 MEQ PO PACK
20.0000 meq | PACK | Freq: Two times a day (BID) | ORAL | Status: DC
  Filled 2013-06-24: qty 1

## 2013-06-24 MED ORDER — LORAZEPAM 2 MG/ML IJ SOLN
1.0000 mg | Freq: Once | INTRAMUSCULAR | Status: AC
  Administered 2013-06-24: 1 mg via INTRAMUSCULAR
  Filled 2013-06-24: qty 1

## 2013-06-24 NOTE — ED Provider Notes (Signed)
CSN: [id number]     Arrival date & time 06/24/13  1149 History     First MD Initiated Contact with Patient 06/24/13 1157     Chief Complaint  Patient presents with  . Alcohol Intoxication   (Consider location/radiation/quality/duration/timing/severity/associated sxs/prior Treatment) HPI Patient presents to the emergency department for detox from alcohol.  Patient, states he drinks a half gallon of Everclear a day.  Patient denies chest pain, shortness breath, nausea, vomiting, hallucinations, tremor, weakness, numbness, dizziness, headache, blurred vision, or syncope.  Patient, states, that he recently went to detox back in June and was sober for 45 days Past Medical History  Diagnosis Date  . ETOH abuse   . Psoriasis   . Cancer     Mother and sister unknown type of cancer   Past Surgical History  Procedure Laterality Date  . Left 4th finger surgery    . Esophagogastroduodenoscopy N/A 05/03/2013    Procedure: ESOPHAGOGASTRODUODENOSCOPY (EGD);  Surgeon: Meryl Dare, MD;  Location: [facility];  Service: Endoscopy;  Laterality: N/A;   Family History  Problem Relation Age of Onset  . Stroke Father   . Diabetes type II Mother   . Cancer - Colon Other    History  Substance Use Topics  . Smoking status: Current Every Day Smoker -- 1.00 packs/day for 20 years    Types: Cigarettes  . Smokeless tobacco: Never Used  . Alcohol Use: Yes     Comment: drinks 1 pt per day    Review of Systems All other systems negative except as documented in the HPI. All pertinent positives and negatives as reviewed in the HPI. Allergies  Review of patient's allergies indicates no known allergies.  Home Medications   Current Outpatient Rx  Name  Route  Sig  Dispense  Refill  . Multiple Vitamin (MULTIVITAMIN WITH MINERALS) TABS   Oral   Take 1 tablet by mouth daily.         . QUEtiapine (SEROQUEL) 100 MG tablet   Oral   Take 1 tablet (100 mg total) by mouth at bedtime. For mood control   30 tablet   0   . metoprolol tartrate (LOPRESSOR) 25 MG tablet   Oral   Take 1 tablet (25 mg total) by mouth 2 (two) times daily.   60 tablet   0    BP 150/92  Pulse 129  Temp(Src) 98.1 F (36.7 C)  Resp 18  SpO2 100% Physical Exam  Nursing note and vitals reviewed. Constitutional: He is oriented to person, place, and time. He appears well-developed and well-nourished. No distress.  HENT:  Head: Normocephalic and atraumatic.  Mouth/Throat: Oropharynx is clear and moist.  Eyes: Pupils are equal, round, and reactive to light.  Neck: Normal range of motion. Neck supple.  Cardiovascular: Normal rate, regular rhythm and normal heart sounds.  Exam reveals no gallop and no friction rub.   No murmur heard. Pulmonary/Chest: Effort normal and breath sounds normal. No respiratory distress.  Neurological: He is alert and oriented to person, place, and time.  Skin: Skin is warm and dry.  Psychiatric: Thought content normal. His mood appears not anxious. His affect is not angry. His speech is slurred. His speech is not rapid and/or pressured and not delayed. He is not agitated and not aggressive. He does not exhibit a depressed mood.    ED Course   Procedures (including critical care time)  Labs Reviewed  ETHANOL - Abnormal; Notable for the following:    Alcohol, Ethyl (  B) 383 (*)    All other components within normal limits  CBC WITH DIFFERENTIAL - Abnormal; Notable for the following:    WBC 11.3 (*)    RBC 4.15 (*)    Hemoglobin 12.9 (*)    HCT 38.7 (*)    Neutrophils Relative % 78 (*)    Neutro Abs 8.9 (*)    All other components within normal limits  COMPREHENSIVE METABOLIC PANEL - Abnormal; Notable for the following:    Potassium 3.3 (*)    Glucose, Bld 159 (*)    Total Bilirubin 0.2 (*)    All other components within normal limits  URINE RAPID DRUG SCREEN (HOSP PERFORMED)   Ct Head Wo Contrast  06/23/2013   *RADIOLOGY REPORT*  Clinical Data: Altered mental status.   Found unresponsive.  CT HEAD WITHOUT CONTRAST  Technique:  Contiguous axial images were obtained from the base of the skull through the vertex without contrast.  Comparison: 04/29/2013  Findings: No acute intracranial abnormality.  Specifically, no hemorrhage, hydrocephalus, mass lesion, acute infarction, or significant intracranial injury.  No acute calvarial abnormality. Visualized paranasal sinuses and mastoids clear.  Orbital soft tissues unremarkable.  IMPRESSION: No acute intracranial abnormality.   Original Report Authenticated By: Charlett Nose, M.D.   Dg Chest Port 1 View  06/23/2013   *RADIOLOGY REPORT*  Clinical Data: Altered mental status.  Unresponsive.  PORTABLE CHEST - 1 VIEW  Comparison: 04/30/2013  Findings: Low lung volumes.  Vascular crowding.  Mild cardiomegaly and bibasilar atelectasis.  No effusions.  No acute bony abnormality.  IMPRESSION: Cardiomegaly, vascular congestion.  Low lung volumes with bibasilar atelectasis.   Original Report Authenticated By: Charlett Nose, M.D.   Patient will be referred to the ACT Team and psych for placement for deep  MDM    Carlyle Dolly, PA-C 06/24/13 1531

## 2013-06-24 NOTE — BH Assessment (Signed)
Assessment Note  Jeffrey Black is an 52 y.o. male who presents to [facility] seeking detox from alcohol.  He reports that he had been sober for 45 days until he began drinking again to help him cope with his psoriasis.  He states he has been drinking 2 bottles of everclear over the last 3 days.  [facility] is [facility] at [facility] and they brought him to the ED for treatment.  He is able to return to the facility when his treatment is complete.  He denies any SI, now or in the last six months, HI, now or in the last six months, or AVH.  His current CIWA is a 10, with withdrawal symptoms of anxiety, headache, tremor and sweats.  He denies any other substance use.  He reports he has been in AA since his discharge from his last detox at [facility] in June and that he has a temporary sponsor.  THis Clinical research associate assessed the patient in the presence of Maryjean Morn, [place], who is in agreement with the disposition for inpatient detox at [facility].     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 ETOH abuse   . Psoriasis   . Cancer     Mother and sister unknown type of cancer   Axis IV: problems with access to health care services Axis V: 41-50 serious symptoms  Past Medical History:  Past Medical History  Diagnosis Date  . ETOH abuse   . Psoriasis   . Cancer     Mother and sister unknown type of cancer    Past Surgical History  Procedure Laterality Date  . Left 4th finger surgery    . Esophagogastroduodenoscopy N/A 05/03/2013    Procedure: ESOPHAGOGASTRODUODENOSCOPY (EGD);  Surgeon: Meryl Dare, MD;  Location: [facility];  Service: Endoscopy;  Laterality: N/A;    Family History:  Family History  Problem Relation Age of Onset  . Stroke Father   . Diabetes type II Mother   . Cancer - Colon Other     Social History:  reports that he has been smoking Cigarettes.  He has a 20 pack-year smoking  history. He has never used smokeless tobacco. He reports that  drinks alcohol. He reports that he does not use illicit drugs.  Additional Social History:  Alcohol / Drug Use History of alcohol / drug use?: Yes Substance #1 Name of Substance 1: Alcohol-Everclear 1 - Age of First Use: 16 1 - Amount (size/oz): 2 bottles 1 - Frequency: over 2 days 1 - Duration: 3 days 1 - Last Use / Amount: 06/24/13-  CIWA: CIWA-Ar BP: 134/82 mmHg Pulse Rate: 119 (RN notified) Nausea and Vomiting: no nausea and no vomiting Tactile Disturbances: none Tremor: two Auditory Disturbances: not present Paroxysmal Sweats: two Visual Disturbances: not present Anxiety: moderately anxious, or guarded, so anxiety is inferred Headache, Fullness in Head: very mild Agitation: normal activity Orientation and Clouding of Sensorium: cannot do serial additions or is uncertain about date CIWA-Ar Total: 10 COWS:    Allergies: No Known Allergies  Home Medications:  (Not in a hospital admission)  OB/GYN Status:  No LMP for male patient.  General Assessment Data Location of Assessment: [facility] Is this a Tele or Face-to-Face Assessment?: Face-to-Face Is this an Initial Assessment or a Re-assessment for this encounter?: Initial Assessment Living Arrangements: Alone Can pt return to current living arrangement?: Yes  Admission Status: Voluntary Is patient capable of signing voluntary admission?: Yes Transfer from: Acute Hospital Referral Source: Self/Family/Friend  Education Status Is patient currently in school?: No  Risk to self Suicidal Ideation: No Suicidal Intent: No Is patient at risk for suicide?: No Suicidal Plan?: No Access to Means: No What has been your use of drugs/alcohol within the last 12 months?: recent relapse after 45 days sobriety Previous Attempts/Gestures: No How many times?: 0 Intentional Self Injurious Behavior: None Family Suicide History: No Recent stressful life event(s): Other  (Comment);Recent negative physical changes (recent replapse, psoriasis) Persecutory voices/beliefs?: No Depression: Yes Depression Symptoms: Tearfulness;Isolating;Guilt;Loss of interest in usual pleasures;Feeling worthless/self pity;Feeling angry/irritable Substance abuse history and/or treatment for substance abuse?: Yes Suicide prevention information given to non-admitted patients: Not applicable  Risk to Others Homicidal Ideation: No Thoughts of Harm to Others: No Current Homicidal Intent: No Current Homicidal Plan: No Access to Homicidal Means: No History of harm to others?: No Assessment of Violence: None Noted Does patient have access to weapons?: No Criminal Charges Pending?: No Does patient have a court date: No  Psychosis Hallucinations: None noted Delusions: None noted  Mental Status Report Appear/Hygiene: Disheveled Eye Contact: Fair Motor Activity: Freedom of movement Speech: Logical/coherent;Slurred Level of Consciousness: Quiet/awake Mood: Depressed Affect: Appropriate to circumstance Anxiety Level: None Thought Processes: Coherent;Relevant Judgement: Unimpaired Orientation: Person;Place;Time;Situation Obsessive Compulsive Thoughts/Behaviors: None  Cognitive Functioning Concentration: Normal Memory: Recent Intact;Remote Intact IQ: Average Insight: Fair Impulse Control: Poor Appetite: Good Sleep: No Change Total Hours of Sleep: 9 Vegetative Symptoms: None  ADLScreening [facility] Assessment Services) Patient's cognitive ability adequate to safely complete daily activities?: Yes Patient able to express need for assistance with ADLs?: Yes Independently performs ADLs?: Yes (appropriate for developmental age)  Prior Inpatient Therapy Prior Inpatient Therapy: Yes Prior Therapy Dates: June 2014 Prior Therapy Facilty/Provider(s): [facility] Reason for Treatment: Detox  Prior Outpatient Therapy Prior Outpatient Therapy: Yes Prior Therapy Dates: ongoing Prior  Therapy Facilty/Provider(s): [facility] Reason for Treatment: SA  ADL Screening (condition at time of admission) Patient's cognitive ability adequate to safely complete daily activities?: Yes Patient able to express need for assistance with ADLs?: Yes Independently performs ADLs?: Yes (appropriate for developmental age)  Home Assistive Devices/Equipment Home Assistive Devices/Equipment: None    Abuse/Neglect Assessment (Assessment to be complete while patient is alone) Physical Abuse: Denies Verbal Abuse: Denies Sexual Abuse: Denies     Advance Directives (For Healthcare) Advance Directive: Patient does not have advance directive;Patient would not like information Nutrition Screen- [facility] Adult/[facility] Patient's home diet: Regular  Additional Information 1:1 In Past 12 Months?: No CIRT Risk: No Elopement Risk: No Does patient have medical clearance?: Yes     Disposition:  Disposition Initial Assessment Completed for this Encounter: Yes Disposition of Patient: Inpatient treatment program Type of inpatient treatment program: Adult  On Site Evaluation by:   Reviewed with Physician:    Steward Ros 06/24/2013 11:13 PM

## 2013-06-24 NOTE — ED Notes (Signed)
[facility] had [facility]an and fluid, pt was sent back her for detox, pt smells of ETOH.

## 2013-06-24 NOTE — ED Provider Notes (Signed)
4:14 AM ambulating to the restroom NAD, tolerates PO fluids, speech clear, no deficits on exam, PT is calling to try to get a ride home.   PT admits to alcohol use, no SI/ HI  Stable for d/c home, ETOH precautions and outpatient referrals provided    Sunnie Nielsen, MD 06/24/13 [phone]

## 2013-06-24 NOTE — ED Provider Notes (Signed)
Medical screening examination/treatment/procedure(s) were performed by non-physician practitioner and as supervising physician I was immediately available for consultation/collaboration.    Christien Berthelot R Hanley Rispoli, MD 06/24/13 1623 

## 2013-06-24 NOTE — ED Notes (Signed)
Pt agitated states that he is ready to leave.  Alert and oriented x 4.  No distress noted.  Resp symmetrical and unlabored.  Skin warm and dry.  Pt denies SI or HI and is acting appropiate.  Dr. Dierdre Highman notified.  States that if pt is able to obtain a ride he can sign AMA.  Pt informed [employer] called to be paid for by pt.

## 2013-06-24 NOTE — ED Notes (Signed)
1 bag of belongings placed in locker 26 by Aggie Cosier

## 2013-06-24 NOTE — ED Notes (Signed)
Per pt, was at [facility] yesterday for same symptoms-last drink this am

## 2013-06-24 NOTE — ED Notes (Addendum)
Pt is awake and alert, denies HI or SI, pt reports he wants help to detox however he would only like to 4 mg of Ativan, like is last inpatient treatment in June/2014. Pt is agitated, yelling and aggressive. "Fuck y'all, you can't help me" pt is redirectable for 2 to 3 mins at a time. Pt is threaten the [employer] officer and staff.   Support and encouragement offered. Will continue to monitor for safety.

## 2013-06-24 NOTE — ED Notes (Signed)
Due to patient heart rate, patient cannot go home at this time.   Dr. Dierdre Highman advised that we need to get additional fluids hung on patient and let him rest for another couple of hours.  Patient is not cooperative and refused fluids.   Patient request to speak with MD.   MD aware.

## 2013-06-24 NOTE — ED Notes (Signed)
Pt ambulated around nurses station and back to room with no difficulty.

## 2013-06-24 NOTE — ED Notes (Addendum)
Pt is unsteady on feet. Pt will go back to psych ED once he is ambulatory and steady on his feet.  Charge [place] aware.

## 2013-06-24 NOTE — Progress Notes (Signed)
[facility] ED Cm spoke with pt who states he does not have a pcp.  Pt noted attempting to walk to bathroom pt noted weaving and staggering in hallway and attempting to go in other patients rooms CM assisted pt to and from the bathroom and back to his bed ED RN updated

## 2013-06-24 NOTE — ED Notes (Addendum)
Pt requested that staff speak to his niece regarding his difficulties related detoxing from ETOH. Per his request, staff did speak with his niece who relayed that he has a history of multiple, difficult detoxes and required one instance of medical detox r/t tachycardia. Staff will monitor closely as pts ETOH level decreases.

## 2013-06-24 NOTE — Progress Notes (Addendum)
Chaplain saw pt while rounding in ED.  Familiar with pt from previous admission.  Provided support with pt at bedside.  Pt asking for chaplain to locate his niece, whom works at [employer].  Niece was not on shift at time.  Reported to pt.    Belva Crome MDiv

## 2013-06-25 ENCOUNTER — Encounter (HOSPITAL_COMMUNITY): Payer: Self-pay | Admitting: *Deleted

## 2013-06-25 ENCOUNTER — Inpatient Hospital Stay (HOSPITAL_COMMUNITY)
Admission: AD | Admit: 2013-06-25 | Discharge: 2013-06-29 | DRG: 897 | Disposition: A | Discharge status: Home / Self Care | Payer: PRIVATE HEALTH INSURANCE | Attending: Psychiatry | Admitting: Psychiatry

## 2013-06-25 DIAGNOSIS — F10239 Alcohol dependence with withdrawal, unspecified: Secondary | ICD-10-CM

## 2013-06-25 DIAGNOSIS — F609 Personality disorder, unspecified: Secondary | ICD-10-CM | POA: Diagnosis present

## 2013-06-25 DIAGNOSIS — F329 Major depressive disorder, single episode, unspecified: Secondary | ICD-10-CM

## 2013-06-25 DIAGNOSIS — D649 Anemia, unspecified: Secondary | ICD-10-CM

## 2013-06-25 DIAGNOSIS — F3130 Bipolar disorder, current episode depressed, mild or moderate severity, unspecified: Secondary | ICD-10-CM | POA: Diagnosis present

## 2013-06-25 DIAGNOSIS — F32A Depression, unspecified: Secondary | ICD-10-CM

## 2013-06-25 DIAGNOSIS — F1020 Alcohol dependence, uncomplicated: Principal | ICD-10-CM

## 2013-06-25 DIAGNOSIS — R000 Tachycardia, unspecified: Secondary | ICD-10-CM

## 2013-06-25 DIAGNOSIS — D61818 Other pancytopenia: Secondary | ICD-10-CM

## 2013-06-25 DIAGNOSIS — IMO0001 Reserved for inherently not codable concepts without codable children: Secondary | ICD-10-CM

## 2013-06-25 DIAGNOSIS — R933 Abnormal findings on diagnostic imaging of other parts of digestive tract: Secondary | ICD-10-CM

## 2013-06-25 DIAGNOSIS — E876 Hypokalemia: Secondary | ICD-10-CM

## 2013-06-25 DIAGNOSIS — I10 Essential (primary) hypertension: Secondary | ICD-10-CM | POA: Diagnosis present

## 2013-06-25 DIAGNOSIS — Z79899 Other long term (current) drug therapy: Secondary | ICD-10-CM

## 2013-06-25 DIAGNOSIS — E872 Acidosis, unspecified: Secondary | ICD-10-CM

## 2013-06-25 DIAGNOSIS — D696 Thrombocytopenia, unspecified: Secondary | ICD-10-CM

## 2013-06-25 DIAGNOSIS — R748 Abnormal levels of other serum enzymes: Secondary | ICD-10-CM

## 2013-06-25 DIAGNOSIS — F10929 Alcohol use, unspecified with intoxication, unspecified: Secondary | ICD-10-CM

## 2013-06-25 DIAGNOSIS — F10939 Alcohol use, unspecified with withdrawal, unspecified: Secondary | ICD-10-CM

## 2013-06-25 DIAGNOSIS — F1010 Alcohol abuse, uncomplicated: Secondary | ICD-10-CM

## 2013-06-25 HISTORY — DX: Essential (primary) hypertension: I10

## 2013-06-25 MED ORDER — CHLORDIAZEPOXIDE HCL 25 MG PO CAPS
25.0000 mg | ORAL_CAPSULE | Freq: Four times a day (QID) | ORAL | Status: AC | PRN
  Administered 2013-06-26 – 2013-06-27 (×2): 25 mg via ORAL
  Filled 2013-06-25 (×2): qty 1

## 2013-06-25 MED ORDER — ONDANSETRON 4 MG PO TBDP: 4.0000 mg | ORAL_TABLET | Freq: Three times a day (TID) | ORAL | Status: DC | PRN

## 2013-06-25 MED ORDER — HYDROXYZINE HCL 25 MG PO TABS
25.0000 mg | ORAL_TABLET | Freq: Four times a day (QID) | ORAL | Status: AC | PRN
  Administered 2013-06-25 – 2013-06-26 (×2): 25 mg via ORAL

## 2013-06-25 MED ORDER — NICOTINE 21 MG/24HR TD PT24
21.0000 mg | MEDICATED_PATCH | Freq: Every day | TRANSDERMAL | Status: DC
  Administered 2013-06-26 – 2013-06-29 (×4): 21 mg via TRANSDERMAL
  Filled 2013-06-25 (×8): qty 1

## 2013-06-25 MED ORDER — ONDANSETRON 4 MG PO TBDP: 4.0000 mg | ORAL_TABLET | Freq: Four times a day (QID) | ORAL | Status: AC | PRN

## 2013-06-25 MED ORDER — CHLORDIAZEPOXIDE HCL 25 MG PO CAPS
25.0000 mg | ORAL_CAPSULE | Freq: Every day | ORAL | Status: AC
  Administered 2013-06-29: 25 mg via ORAL
  Filled 2013-06-25: qty 1

## 2013-06-25 MED ORDER — ADULT MULTIVITAMIN W/MINERALS CH
1.0000 | ORAL_TABLET | Freq: Every day | ORAL | Status: DC
  Administered 2013-06-25 – 2013-06-29 (×5): 1 via ORAL
  Filled 2013-06-25 (×6): qty 1
  Filled 2013-06-25: qty 3

## 2013-06-25 MED ORDER — ALUM & MAG HYDROXIDE-SIMETH 200-200-20 MG/5ML PO SUSP: 30.0000 mL | ORAL | Status: DC | PRN

## 2013-06-25 MED ORDER — VITAMIN B-1 100 MG PO TABS
100.0000 mg | ORAL_TABLET | Freq: Every day | ORAL | Status: DC
  Administered 2013-06-25 – 2013-06-29 (×5): 100 mg via ORAL
  Filled 2013-06-25 (×7): qty 1

## 2013-06-25 MED ORDER — CHLORDIAZEPOXIDE HCL 25 MG PO CAPS
25.0000 mg | ORAL_CAPSULE | Freq: Three times a day (TID) | ORAL | Status: AC
  Administered 2013-06-27 (×3): 25 mg via ORAL
  Filled 2013-06-25 (×3): qty 1

## 2013-06-25 MED ORDER — LOPERAMIDE HCL 2 MG PO CAPS: 2.0000 mg | ORAL_CAPSULE | ORAL | Status: AC | PRN

## 2013-06-25 MED ORDER — CHLORDIAZEPOXIDE HCL 25 MG PO CAPS
50.0000 mg | ORAL_CAPSULE | Freq: Once | ORAL | Status: AC
  Administered 2013-06-25: 50 mg via ORAL
  Filled 2013-06-25: qty 2

## 2013-06-25 MED ORDER — QUETIAPINE FUMARATE 50 MG PO TABS
50.0000 mg | ORAL_TABLET | Freq: Two times a day (BID) | ORAL | Status: DC
  Administered 2013-06-26 – 2013-06-29 (×7): 50 mg via ORAL
  Filled 2013-06-25 (×5): qty 1
  Filled 2013-06-25: qty 6
  Filled 2013-06-25: qty 1
  Filled 2013-06-25: qty 6
  Filled 2013-06-25 (×4): qty 1

## 2013-06-25 MED ORDER — GABAPENTIN 300 MG PO CAPS
300.0000 mg | ORAL_CAPSULE | Freq: Three times a day (TID) | ORAL | Status: DC
  Administered 2013-06-25 – 2013-06-28 (×9): 300 mg via ORAL
  Filled 2013-06-25 (×13): qty 1

## 2013-06-25 MED ORDER — QUETIAPINE FUMARATE 100 MG PO TABS
100.0000 mg | ORAL_TABLET | Freq: Every day | ORAL | Status: DC
  Administered 2013-06-25 – 2013-06-27 (×3): 100 mg via ORAL
  Filled 2013-06-25 (×5): qty 1

## 2013-06-25 MED ORDER — ESCITALOPRAM OXALATE 10 MG PO TABS
10.0000 mg | ORAL_TABLET | Freq: Every day | ORAL | Status: DC
  Administered 2013-06-26 – 2013-06-27 (×2): 10 mg via ORAL
  Filled 2013-06-25 (×5): qty 1

## 2013-06-25 MED ORDER — CHLORDIAZEPOXIDE HCL 25 MG PO CAPS
25.0000 mg | ORAL_CAPSULE | ORAL | Status: AC
  Administered 2013-06-28 (×2): 25 mg via ORAL
  Filled 2013-06-25 (×2): qty 1

## 2013-06-25 MED ORDER — CHLORDIAZEPOXIDE HCL 25 MG PO CAPS
25.0000 mg | ORAL_CAPSULE | Freq: Four times a day (QID) | ORAL | Status: AC
  Administered 2013-06-25 – 2013-06-26 (×5): 25 mg via ORAL
  Filled 2013-06-25 (×6): qty 1

## 2013-06-25 MED ORDER — THIAMINE HCL 100 MG/ML IJ SOLN: 100.0000 mg | Freq: Every day | INTRAMUSCULAR | Status: DC

## 2013-06-25 MED ORDER — METOPROLOL TARTRATE 25 MG PO TABS
25.0000 mg | ORAL_TABLET | Freq: Two times a day (BID) | ORAL | Status: DC
  Administered 2013-06-25 – 2013-06-29 (×9): 25 mg via ORAL
  Filled 2013-06-25 (×3): qty 1
  Filled 2013-06-25: qty 6
  Filled 2013-06-25 (×4): qty 1
  Filled 2013-06-25: qty 6
  Filled 2013-06-25 (×4): qty 1

## 2013-06-25 MED ORDER — NICOTINE 21 MG/24HR TD PT24
MEDICATED_PATCH | TRANSDERMAL | Status: AC
  Administered 2013-06-25: 21 mg via TRANSDERMAL
  Filled 2013-06-25: qty 1

## 2013-06-25 MED ORDER — ACETAMINOPHEN 325 MG PO TABS
650.0000 mg | ORAL_TABLET | Freq: Four times a day (QID) | ORAL | Status: DC | PRN
  Administered 2013-06-25: 650 mg via ORAL

## 2013-06-25 MED ORDER — MAGNESIUM HYDROXIDE 400 MG/5ML PO SUSP: 30.0000 mL | Freq: Every day | ORAL | Status: DC | PRN

## 2013-06-25 NOTE — ED Notes (Signed)
Pt to transport to [facility] after breakfast (has missed meal at [facility])

## 2013-06-25 NOTE — ED Notes (Signed)
Security called for transport.

## 2013-06-25 NOTE — Progress Notes (Signed)
Patient ID: Jeffrey Black, male   DOB: 1961/04/14, 52 y.o.   MRN: [id number] 06-25-2013 nursing admission note: [facility] from etoh. He has been on the unit before around June 2014 and is staying in a half way house called [facility] He denies any si/hi/av. His ciwa was a 9 and he was given his initial med's on the unit. His w/d symptoms are tremors and anxiety. He drinks about 1/5 of liquor daily and denies any drug use. His last drink was on 06-24-13. He stated he had no pain, no allergies and has never had a seizure. He is a fall risk, with precautions taken and smokes 1ppd.  He was given a nicotine patch 21 mg. 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 He has a degree from [employer]. He has a medical hx of htn and was hypertensive on admission.  He was pleasant/coooperative during the admission process and was escorted to the 500 hall. He was familiar with the milieu. This admission is complete.

## 2013-06-25 NOTE — ED Notes (Signed)
eatting breakfast 

## 2013-06-25 NOTE — ED Notes (Signed)
Up to the bathroom, ambulatory w/o difficulty 

## 2013-06-25 NOTE — [facility] Suicide Risk Assessment (Signed)
Suicide Risk Assessment  Admission Assessment     Nursing information obtained from:  Patient Demographic factors:  Male;Caucasian;Unemployed Current Mental Status:  NA (denies any si/hi//av) Loss Factors:  Decrease in vocational status;Financial problems / change in socioeconomic status Historical Factors:  Family history of mental illness or substance abuse Risk Reduction Factors:  Sense of responsibility to family;Religious beliefs about death;Positive social support  CLINICAL FACTORS:   Depression:   Comorbid alcohol abuse/dependence Insomnia Alcohol/Substance Abuse/Dependencies  COGNITIVE FEATURES THAT CONTRIBUTE TO RISK:  Closed-mindedness Polarized thinking Thought constriction (tunnel vision)    SUICIDE RISK:   Moderate:  Frequent suicidal ideation with limited intensity, and duration, some specificity in terms of plans, no associated intent, good self-control, limited dysphoria/symptomatology, some risk factors present, and identifiable protective factors, including available and accessible social support.  PLAN OF CARE: Supportive approach/coping skills/relapse prevention                               [facility] Detox protocol                               Reassess and address the co morbidites  I certify that inpatient services furnished can reasonably be expected to improve the patient's condition.  Jovann Luse A 06/25/2013, 6:01 PM

## 2013-06-25 NOTE — ED Notes (Signed)
EDP into see 

## 2013-06-25 NOTE — ED Notes (Signed)
Pt ambulatory to [facility] w/ security and mHt.  Belongings given to security

## 2013-06-25 NOTE — H&P (Signed)
Psychiatric Admission Assessment Adult  Patient Identification:  Jeffrey Black Date of Evaluation:  06/25/2013 Chief Complaint:  Alcohol Dependence 303.90 History of Present Illness:  Patient presented to [facility] wanting detox from alcohol.  Patient denies suicidal ideation, homicidal ideation, psychosis, and paranoia.    Patient states that he started to drink on Wednesday and was sitting in car at a halfway house and they called 911 for EMS to come get him and carry to hospital.  "Patient states that he did not feel like he was getting the help needed at the hospital so signed out AMA.  I went and got another fifth of Everclear and thought I needed to do something different; get help with my mood stabilization." Elements:  Location:  [facility]. Quality:  Affecting patient mentally and physically. Severity:  Drinking alcohol daily. Associated Signs/Symptoms: Depression Symptoms:  depressed mood, anhedonia, (Hypo) Manic Symptoms:  None noted Anxiety Symptoms:  Excessive Worry, Psychotic Symptoms:  None noted PTSD Symptoms: None noted  Psychiatric Specialty Exam: Physical Exam  Constitutional: He appears well-developed.  HENT:  Head: Normocephalic.  Neck: Normal range of motion.  Respiratory: Effort normal.  Musculoskeletal: Normal range of motion.  Skin: Skin is warm and dry.    Review of Systems  Musculoskeletal:       Porosis on face/legs bilaterally/ arms bilaterally  Skin:       Porosis   Psychiatric/Behavioral: Positive for depression, memory loss and substance abuse. Negative for suicidal ideas and hallucinations. The patient is nervous/anxious and has insomnia.     Blood pressure 149/101, pulse 108, temperature 98.5 F (36.9 C), temperature source Oral, resp. rate 16, height 6' (1.829 m), weight 90.719 kg (200 lb), SpO2 98.00%.Body mass index is 27.12 kg/(m^2).  General Appearance: Casual and Disheveled  Eye Contact::  Good  Speech:  Clear and  Coherent and Normal Rate  Volume:  Normal  Mood:  Anxious and Depressed  Affect:  Depressed and Flat  Thought Process:  Circumstantial and Goal Directed  Orientation:  Full (Time, Place, and Person)  Thought Content:  Rumination  Suicidal Thoughts:  No  Homicidal Thoughts:  No  Memory:  Immediate;   Fair Recent;   Fair Remote;   Fair  Judgement:  Impaired  Insight:  Fair and Present  Psychomotor Activity:  Normal  Concentration:  Fair  Recall:  Fair  Akathisia:  No  Handed:  Right  AIMS (if indicated):     Assets:  Communication Skills Desire for Improvement Housing Transportation  Sleep:       Past Psychiatric History: Diagnosis:  Hospitalizations:  Outpatient Care:  Substance Abuse Care:  Self-Mutilation:  Suicidal Attempts:  Violent Behaviors:   Past Medical History:   Past Medical History  Diagnosis Date  . ETOH abuse   . Psoriasis   . Hypertension    Loss of Consciousness:  Black out related to alcohol Allergies:  No Known Allergies PTA Medications: Prescriptions prior to admission  Medication Sig Dispense Refill  . Multiple Vitamin (MULTIVITAMIN WITH MINERALS) TABS Take 1 tablet by mouth daily.      . QUEtiapine (SEROQUEL) 100 MG tablet Take 1 tablet (100 mg total) by mouth at bedtime. For mood control  30 tablet  0  . metoprolol tartrate (LOPRESSOR) 25 MG tablet Take 1 tablet (25 mg total) by mouth 2 (two) times daily.  60 tablet  0    Previous Psychotropic Medications:  Medication/Dose  Substance Abuse History in the last 12 months:  yes  Consequences of Substance Abuse: Blackouts:    Social History:  reports that he has been smoking Cigarettes.  He has a 20 pack-year smoking history. He has never used smokeless tobacco. He reports that  drinks alcohol. He reports that he does not use illicit drugs. Additional Social History: Pain Medications: none  Prescriptions: seroquel, mv, lopressor  Over the Counter: none  History  of alcohol / drug use?: Yes Longest period of sobriety (when/how long): 6 wks  Negative Consequences of Use: Financial;Legal;Personal relationships;Work / Programmer, multimedia Withdrawal Symptoms: Sweats;Change in blood pressure;Tremors Name of Substance 1: etoh -vodka and everclear  1 - Age of First Use: 16 1 - Amount (size/oz): 1/5 daily  1 - Frequency: daily  1 - Duration: 3 days  1 - Last Use / Amount: 06-24-13                  Current Place of Residence:   Place of Birth:   Family Members: Marital Status:  Divorced Children:  Sons: 1 step son 49 yr  Daughters: 1 daughter 93 yr Relationships: Education:  Corporate treasurer Problems/Performance: Religious Beliefs/Practices: History of Abuse (Emotional/Psychical/Sexual) Occupational Experiences; Hotel manager History:  None. Legal History: Hobbies/Interests:  Family History:   Family History  Problem Relation Age of Onset  . Stroke Father   . Diabetes type II Mother   . Cancer - Colon Other     Results for orders placed during the hospital encounter of 06/24/13 (from the past 72 hour(s))  ETHANOL     Status: Abnormal   Collection Time    06/24/13 12:11 PM      Result Value Range   Alcohol, Ethyl (B) 383 (*) 0 - 11 mg/dL   Comment:            LOWEST DETECTABLE LIMIT FOR     SERUM ALCOHOL IS 11 mg/dL     FOR MEDICAL PURPOSES ONLY  CBC WITH DIFFERENTIAL     Status: Abnormal   Collection Time    06/24/13 12:11 PM      Result Value Range   WBC 11.3 (*) 4.0 - 10.5 K/uL   RBC 4.15 (*) 4.22 - 5.81 MIL/uL   Hemoglobin 12.9 (*) 13.0 - 17.0 g/dL   HCT 40.9 (*) 81.1 - 91.4 %   MCV 93.3  78.0 - 100.0 fL   MCH 31.1  26.0 - 34.0 pg   MCHC 33.3  30.0 - 36.0 g/dL   RDW 78.2  95.6 - 21.3 %   Platelets 342  150 - 400 K/uL   Neutrophils Relative % 78 (*) 43 - 77 %   Neutro Abs 8.9 (*) 1.7 - 7.7 K/uL   Lymphocytes Relative 15  12 - 46 %   Lymphs Abs 1.7  0.7 - 4.0 K/uL   Monocytes Relative 6  3 - 12 %   Monocytes Absolute 0.7  0.1 -  1.0 K/uL   Eosinophils Relative 0  0 - 5 %   Eosinophils Absolute 0.0  0.0 - 0.7 K/uL   Basophils Relative 1  0 - 1 %   Basophils Absolute 0.1  0.0 - 0.1 K/uL  COMPREHENSIVE METABOLIC PANEL     Status: Abnormal   Collection Time    06/24/13 12:11 PM      Result Value Range   Sodium 141  135 - 145 mEq/L   Potassium 3.3 (*) 3.5 - 5.1 mEq/L   Chloride 104  96 - 112 mEq/L   CO2 25  19 - 32 mEq/L   Glucose, Bld 159 (*) 70 - 99 mg/dL   BUN 8  6 - 23 mg/dL   Creatinine, Ser 1.91  0.50 - 1.35 mg/dL   Calcium 8.8  8.4 - 47.8 mg/dL   Total Protein 6.5  6.0 - 8.3 g/dL   Albumin 3.6  3.5 - 5.2 g/dL   AST 26  0 - 37 U/L   ALT 37  0 - 53 U/L   Alkaline Phosphatase 78  39 - 117 U/L   Total Bilirubin 0.2 (*) 0.3 - 1.2 mg/dL   GFR calc non Af Amer >90  >90 mL/min   GFR calc Af Amer >90  >90 mL/min   Comment:            The eGFR has been calculated     using the CKD EPI equation.     This calculation has not been     validated in all clinical     situations.     eGFR's persistently     <90 mL/min signify     possible Chronic Kidney Disease.  ETHANOL     Status: Abnormal   Collection Time    06/25/13  1:59 AM      Result Value Range   Alcohol, Ethyl (B) 138 (*) 0 - 11 mg/dL   Comment:            LOWEST DETECTABLE LIMIT FOR     SERUM ALCOHOL IS 11 mg/dL     FOR MEDICAL PURPOSES ONLY   Psychological Evaluations:  Assessment:   AXIS I:  Alcohol Abuse and Depressive Disorder NOS AXIS II:  Deferred AXIS III:   Past Medical History  Diagnosis Date  . ETOH abuse   . Psoriasis   . Hypertension    AXIS IV:  other psychosocial or environmental problems, problems related to social environment and problems with primary support group AXIS V:  41-50 serious symptoms  Treatment Plan/Recommendations:  1. Admit for crisis management and stabilization. Estimated length of stay is 3 to 5 days. 2. Medication management to reduce current symptoms to base line and improve the   patient's overall  level of functioning.  3. Treat health problems as indicated: Neosporin ointment, apply to wound spot to back of left leg bid. 4. Develop treatment plan to decrease risk of relapse upon discharge and the need for readmission.  5. Psycho-social education regarding relapse prevention and self-care.  6. Health care follow up as needed for medical problems.  7. Review and reinstate any pertinent home medications for other health issues where appropriate.  8.  Call for Consult with Hospitalist for additional specialty patient services as needed  Treatment Plan Summary: Daily contact with patient to assess and evaluate symptoms and progress in treatment Medication management Supportive approach/coping skills/identify detox needs/relapse prevention Reassess and address the co morbidities  Current Medications:  Current Facility-Administered Medications  Medication Dose Route Frequency Provider Last Rate Last Dose  . acetaminophen (TYLENOL) tablet 650 mg  650 mg Oral Q6H PRN Court Joy, PA-C      . alum & mag hydroxide-simeth (MAALOX/MYLANTA) 200-200-20 MG/5ML suspension 30 mL  30 mL Oral PRN Court Joy, PA-C      . chlordiazePOXIDE (LIBRIUM) capsule 25 mg  25 mg Oral Q6H PRN Court Joy, PA-C      . chlordiazePOXIDE (LIBRIUM) capsule 25 mg  25 mg Oral QID Court Joy,  PA-C       Followed by  . [START ON 06/27/2013] chlordiazePOXIDE (LIBRIUM) capsule 25 mg  25 mg Oral TID Court Joy, PA-C       Followed by  . [START ON 06/28/2013] chlordiazePOXIDE (LIBRIUM) capsule 25 mg  25 mg Oral BH-qamhs Court Joy, PA-C       Followed by  . [START ON 06/29/2013] chlordiazePOXIDE (LIBRIUM) capsule 25 mg  25 mg Oral Daily Court Joy, PA-C      . hydrOXYzine (ATARAX/VISTARIL) tablet 25 mg  25 mg Oral Q6H PRN Court Joy, PA-C      . loperamide (IMODIUM) capsule 2-4 mg  2-4 mg Oral PRN Court Joy, PA-C      . magnesium hydroxide (MILK OF MAGNESIA) suspension 30 mL  30 mL  Oral Daily PRN Court Joy, PA-C      . metoprolol tartrate (LOPRESSOR) tablet 25 mg  25 mg Oral BID Court Joy, PA-C   25 mg at 06/25/13 1100  . multivitamin with minerals tablet 1 tablet  1 tablet Oral Daily Court Joy, PA-C   1 tablet at 06/25/13 1055  . nicotine (NICODERM CQ - dosed in mg/24 hours) patch 21 mg  21 mg Transdermal Daily Valente David, RN   21 mg at 06/25/13 1112  . ondansetron (ZOFRAN-ODT) disintegrating tablet 4 mg  4 mg Oral Q6H PRN Court Joy, PA-C      . QUEtiapine (SEROQUEL) tablet 100 mg  100 mg Oral QHS Court Joy, PA-C      . thiamine (VITAMIN B-1) tablet 100 mg  100 mg Oral Daily Court Joy, PA-C   100 mg at 06/25/13 1055    Observation Level/Precautions:  15 minute checks  Laboratory:  CBC Chemistry Profile Folic Acid UA Vitamin B-12  Psychotherapy:  Group sessions  Medications:  Add/Modify/Discontinue as needed for treatment (Librium protocol)  Consultations:  None at this time  Discharge Concerns:  Relapse  Estimated LOS: 3-5 days  Other:     I certify that inpatient services furnished can reasonably be expected to improve the patient's condition.   Assunta Found, FNP-BC 8/9/20141:48 PM  Agree with assessment and plan Madie Reno A. Dub Mikes, M.D.

## 2013-06-25 NOTE — [facility] Group Notes (Signed)
[facility] Group Notes:  (Clinical Social Work)  06/25/2013   3:00-4:00PM  Summary of Progress/Problems:   The main focus of today's process group was for the patient to identify ways in which they have sabotaged their own mental health wellness/recovery.  Motivational interviewing was used to explore the reasons they engage in this behavior, and reasons they may have for wanting to change.  The Stages of Change were explained to the group using a handout, and patients identified where they are with regard to changing self-defeating behaviors.  The patient expressed that he isolates himself, which leads to more depression, which leads to more isolation, and it continues.  He feels he is in the Preparation Stage of Change, and stated that he is making plans to go back on medications which were helpful to him when he was on them in 2010.  He is going to look for a job as well when discharged.  Toward the end of group he opened up more.  Type of Therapy:  Process Group  Participation Level:  Active  Participation Quality:  Attentive and Sharing  Affect:  Blunted and Depressed  Cognitive:  Alert  Insight:  Developing/Improving  Engagement in Therapy:  Engaged  Modes of Intervention:  Education, Motivational Interviewing   Ambrose Mantle, LCSW 06/25/2013, 4:28 PM

## 2013-06-26 DIAGNOSIS — F1020 Alcohol dependence, uncomplicated: Principal | ICD-10-CM

## 2013-06-26 LAB — BASIC METABOLIC PANEL
Calcium: 8.9 mg/dL (ref 8.4–10.5)
GFR calc Af Amer: >90 mL/min (ref >90)
GFR calc non Af Amer: >90 mL/min (ref >90)
Sodium: 140 mEq/L (ref 135–145)

## 2013-06-26 MED ORDER — HYDROXYZINE HCL 25 MG PO TABS
25.0000 mg | ORAL_TABLET | Freq: Once | ORAL | Status: DC
  Filled 2013-06-26: qty 1

## 2013-06-26 MED ORDER — TRAZODONE HCL 50 MG PO TABS
50.0000 mg | ORAL_TABLET | Freq: Every evening | ORAL | Status: DC | PRN
  Administered 2013-06-27: 50 mg via ORAL
  Filled 2013-06-26: qty 1

## 2013-06-26 NOTE — Progress Notes (Signed)
Patient ID: Jeffrey Black, male   DOB: Apr 16, 1961, 52 y.o.   MRN: [id number] D)  Has been pleasant and cooperative this evening, c/o headache earlier and was given tylenol, and a pitcher of gatorade, fluids were encouraged.  Stated main c/o's were anxiety and tremors.  Has been interacting appropriately with staff and peers, attended group, stayed in dayroom afterward to watch tv, no other c/o's voiced, denies thoughts of self harm. A)  Will continue to monitor for safety, continue POC R)  Safety maintained.

## 2013-06-26 NOTE — Progress Notes (Signed)
Adult Psychoeducational Group Note  Date:  06/26/2013 Time:  1:30PM  Group Topic/Focus:  Making Healthy Choices:   The focus of this group is to help patients identify negative/unhealthy choices they were using prior to admission and identify positive/healthier coping strategies to replace them upon discharge.  Participation Level:  Active  Participation Quality:  Appropriate  Affect:  Appropriate  Cognitive:  Appropriate  Insight: Appropriate  Engagement in Group:  Engaged  Modes of Intervention:  Discussion  Additional Comments:  Pt was active throughout group   Sadira Standard K 06/26/2013, 3:12 PM

## 2013-06-26 NOTE — Progress Notes (Signed)
[facility] Group Notes:  (Nursing/MHT/Case Management/Adjunct)  Date:  06/25/2013 Time:  2000  Type of Therapy:  Psychoeducational Skills  Participation Level:  Minimal  Participation Quality:  Attentive  Affect:  Depressed  Cognitive:  Disorganized  Insight:  Lacking  Engagement in Group:  Limited  Modes of Intervention:  Education  Summary of Progress/Problems: The patient expressed in group that he was simply happy to be here. He also mentioned that he is taking a new set of medication. He did not state a goal for tomorrow.   Hazle Coca S 06/26/2013, 12:48 AM

## 2013-06-26 NOTE — Progress Notes (Signed)
[facility] MD Progress Note  06/26/2013 1:03 PM Jeffrey Black  MRN:  [id number] Subjective:  "I didn't sleep well last night, I had sweats, bad dreams, and I think it was the vistaril." He goes on to say he has had harder detoxes in the past, and is convinced it is the vistaril. Diagnosis:  Alcohol dependence  ADL's:  Impaired  Sleep: Poor  Appetite:  Good  Suicidal Ideation:  denies Homicidal Ideation:  denies AEB (as evidenced by):  Psychiatric Specialty Exam: Review of Systems  Constitutional: Positive for chills and diaphoresis.  Psychiatric/Behavioral: Positive for depression and substance abuse. The patient is nervous/anxious and has insomnia.     Blood pressure 150/105, pulse 97, temperature 96.9 F (36.1 C), temperature source Oral, resp. rate 16, height 6' (1.829 m), weight 90.719 kg (200 lb), SpO2 98.00%.Body mass index is 27.12 kg/(m^2).  General Appearance: Disheveled  Eye Solicitor::  Fair  Speech:  Clear and Coherent  Volume:  Normal  Mood:  Depressed and Irritable  Affect:  Congruent  Thought Process:  Goal Directed  Orientation:  Full (Time, Place, and Person)  Thought Content:  WDL  Suicidal Thoughts:  No  Homicidal Thoughts:  No  Memory:  Immediate;   Fair  Judgement:  Impaired  Insight:  Lacking  Psychomotor Activity:  Normal  + fine tremors both hands.  Concentration:  Fair  Recall:  Fair  Akathisia:  No  Handed:  Right  AIMS (if indicated):     Assets:  Communication Skills Desire for Improvement Physical Health Resilience  Sleep:  Number of Hours: 6.5   Current Medications: Current Facility-Administered Medications  Medication Dose Route Frequency Provider Last Rate Last Dose  . acetaminophen (TYLENOL) tablet 650 mg  650 mg Oral Q6H PRN Court Joy, PA-C   650 mg at 06/25/13 2035  . alum & mag hydroxide-simeth (MAALOX/MYLANTA) 200-200-20 MG/5ML suspension 30 mL  30 mL Oral PRN Court Joy, PA-C      . chlordiazePOXIDE (LIBRIUM) capsule  25 mg  25 mg Oral Q6H PRN Court Joy, PA-C   25 mg at 06/26/13 7829  . chlordiazePOXIDE (LIBRIUM) capsule 25 mg  25 mg Oral QID Court Joy, PA-C   25 mg at 06/26/13 5621   Followed by  . [START ON 06/27/2013] chlordiazePOXIDE (LIBRIUM) capsule 25 mg  25 mg Oral TID Court Joy, PA-C       Followed by  . [START ON 06/28/2013] chlordiazePOXIDE (LIBRIUM) capsule 25 mg  25 mg Oral BH-qamhs Court Joy, PA-C       Followed by  . [START ON 06/29/2013] chlordiazePOXIDE (LIBRIUM) capsule 25 mg  25 mg Oral Daily Court Joy, PA-C      . escitalopram (LEXAPRO) tablet 10 mg  10 mg Oral Daily Rachael Fee, MD   10 mg at 06/26/13 0824  . gabapentin (NEURONTIN) capsule 300 mg  300 mg Oral TID Rachael Fee, MD   300 mg at 06/26/13 1158  . hydrOXYzine (ATARAX/VISTARIL) tablet 25 mg  25 mg Oral Q6H PRN Court Joy, PA-C   25 mg at 06/25/13 2138  . loperamide (IMODIUM) capsule 2-4 mg  2-4 mg Oral PRN Court Joy, PA-C      . magnesium hydroxide (MILK OF MAGNESIA) suspension 30 mL  30 mL Oral Daily PRN Court Joy, PA-C      . metoprolol tartrate (LOPRESSOR) tablet 25 mg  25 mg Oral BID Court Joy, PA-C  25 mg at 06/26/13 0827  . multivitamin with minerals tablet 1 tablet  1 tablet Oral Daily Court Joy, PA-C   1 tablet at 06/26/13 1478  . nicotine (NICODERM CQ - dosed in mg/24 hours) patch 21 mg  21 mg Transdermal Daily Valente David, RN   21 mg at 06/26/13 2956  . ondansetron (ZOFRAN-ODT) disintegrating tablet 4 mg  4 mg Oral Q6H PRN Court Joy, PA-C      . QUEtiapine (SEROQUEL) tablet 100 mg  100 mg Oral QHS Court Joy, PA-C   100 mg at 06/25/13 2136  . QUEtiapine (SEROQUEL) tablet 50 mg  50 mg Oral BID Rachael Fee, MD   50 mg at 06/26/13 2130  . thiamine (VITAMIN B-1) tablet 100 mg  100 mg Oral Daily Court Joy, PA-C   100 mg at 06/26/13 8657    Lab Results:  Results for orders placed during the hospital encounter of 06/24/13 (from the past  48 hour(s))  ETHANOL     Status: Abnormal   Collection Time    06/25/13  1:59 AM      Result Value Range   Alcohol, Ethyl (B) 138 (*) 0 - 11 mg/dL   Comment:            LOWEST DETECTABLE LIMIT FOR     SERUM ALCOHOL IS 11 mg/dL     FOR MEDICAL PURPOSES ONLY    Physical Findings: AIMS: Facial and Oral Movements Muscles of Facial Expression: None, normal Lips and Perioral Area: None, normal Jaw: None, normal Tongue: None, normal,Extremity Movements Upper (arms, wrists, hands, fingers): None, normal Lower (legs, knees, ankles, toes): None, normal, Trunk Movements Neck, shoulders, hips: None, normal, Overall Severity Severity of abnormal movements (highest score from questions above): None, normal Incapacitation due to abnormal movements: None, normal Patient's awareness of abnormal movements (rate only patient's report): No Awareness, Dental Status Current problems with teeth and/or dentures?: No Does patient usually wear dentures?: No  CIWA:  CIWA-Ar Total: 8 COWS:     Treatment Plan Summary: Daily contact with patient to assess and evaluate symptoms and progress in treatment Medication management  Plan: 1. Continue crisis management and stabilization. 2. Medication management to reduce current symptoms to base line and improve patient's overall level of functioning 3. Treat health problems as indicated. 4. Develop treatment plan to decrease risk of relapse upon discharge and the need for readmission. 5. Psycho-social education regarding relapse prevention and self care. 6. Health care follow up as needed for medical problems. 7. Continue home medications where appropriate. 8. Added trazodone for sleep 50mg . X 1 at hs. 9. Reviewed CIWA with patient and symptoms. He would like his lexapro increased to 20mg  ASAP. 10. Reviewed labs and will order BMP to monitor K+  Medical Decision Making Problem Points:  Established problem, stable/improving (1) Data Points:  Review or  order clinical lab tests (1) Review or order medicine tests (1)  I certify that inpatient services furnished can reasonably be expected to improve the patient's condition.  Rona Ravens. Patrece Tallie RPAC 1:25 PM 06/26/2013

## 2013-06-26 NOTE — Progress Notes (Signed)
Psychoeducational Group Note  Date:  06/26/2013 Time:  1015  Group Topic/Focus:  Making Healthy Choices:   The focus of this group is to help patients identify negative/unhealthy choices they were using prior to admission and identify positive/healthier coping strategies to replace them upon discharge.  Participation Level:  Active  Participation Quality:  Appropriate  Affect:  Appropriate  Cognitive:  Appropriate  Insight:  Improving  Engagement in Group:  Engaged  Additional Comments:    Dione Housekeeper 06/26/2013

## 2013-06-26 NOTE — Progress Notes (Signed)
Psychoeducational Group Note  Date: 06/26/2013 Time:  06/26/2013  Group Topic/Focus:  Gratefulness:  The focus of this group is to help patients identify what two things they are most grateful for in their lives. What helps ground them and to center them on their work to their recovery.  Participation Level:  Did Not Attend  Jeffrey Black   

## 2013-06-26 NOTE — [facility] Group Notes (Signed)
[facility] Group Notes:  (Clinical Social Work)  06/26/2013   3:00-4:00PM  Summary of Progress/Problems:   The main focus of today's process group was to   identify the patient's current support system and decide on other supports that can be put in place.  The picture on workbook was used to discuss why additional supports are needed, and a hand-out was distributed with four definitions/levels of support, then used to talk about how patients have given and received all different kinds of support.  An emphasis was placed on using counselor, doctor, therapy groups, 12-step groups, and problem-specific support groups to expand supports.  The patient identified one additional support as being psychiatrist and therapist.  He also will be in AA and halfway house.  Type of Therapy:  Process Group  Participation Level:  Active  Participation Quality:  Attentive and Sharing  Affect:  Appropriate  Cognitive:  Appropriate and Oriented  Insight:  Engaged  Engagement in Therapy:  Engaged  Modes of Intervention:  Education,  Support and [employer], LCSW 06/26/2013, 4:40 PM

## 2013-06-26 NOTE — Progress Notes (Signed)
Pt attended group 

## 2013-06-26 NOTE — Progress Notes (Signed)
D) Pt attending the groups and interacting with his peers. Affect and mood are appropriate. Rates his depression at a 6 and his hopelessness at a 4. Denies SI and HI. States that he feels happy to be here because he made the choice to come and he didn't get so far gone as last time with his drinking. Has participated fully in the program.  A) Given support, reassurance and praise. Encouraged to go to all the groups and to work on the his booklet. R) Denies SI and HI.  Invested in his recovery.

## 2013-06-27 MED ORDER — ESCITALOPRAM OXALATE 20 MG PO TABS
20.0000 mg | ORAL_TABLET | Freq: Every day | ORAL | Status: DC
  Administered 2013-06-28 – 2013-06-29 (×2): 20 mg via ORAL
  Filled 2013-06-27 (×3): qty 1
  Filled 2013-06-27: qty 3

## 2013-06-27 NOTE — [facility] Group Notes (Signed)
[facility] LCSW Group Therapy          Overcoming Obstacles       1:15 -2:30        06/27/2013   1:00 PM     Type of Therapy:  Group Therapy  Participation Level:  Appropriate  Participation Quality:  Appropriate  Affect:  Appropriate, Alert  Cognitive:  Attentive Appropriate  Insight: Developing/Improving Engaged  Engagement in Therapy: Developing/Imprvoing Engaged  Modes of Intervention:  Discussion Exploration  Education Rapport BuildingProblem-Solving Support  Summary of Progress/Problems:  The main focus of today's group was overcoming  Obstacles.  He advised the obstacle he needs to overcome is staying on his medications.  He advised of being on a "cocktail" of medications in 2010 that worked well but began to feel well and stopped taking them.  Patient shared he does not like to be on medications for extended period.  Patient was advised of need for to stay on meds to maintain therapeutic levels.   Wynn Banker 06/27/2013    1:00 PM

## 2013-06-27 NOTE — Progress Notes (Signed)
[facility] Group Notes:  (Nursing/MHT/Case Management/Adjunct)  Date:  06/26/2013 Time:  2000  Type of Therapy:  Psychoeducational Skills  Participation Level:  Active  Participation Quality:  Appropriate  Affect:  Appropriate  Cognitive:  Appropriate  Insight:  Improving  Engagement in Group:  Developing/Improving  Modes of Intervention:  Discussion  Summary of Progress/Problems: The patient expressed in group this evening that he had a good day. He explained that he learned a great deal in group, ie. Self-esteem, support groups. His goal for tomorrow is to speak with the social worker about his discharge plans.   Briella Hobday S 06/27/2013, 1:34 AM

## 2013-06-27 NOTE — Progress Notes (Signed)
Patient ID: Jeffrey Black, male   DOB: 12-Nov-1961, 52 y.o.   MRN: [id number] D)  Has been pleasant and cooperative this evening, stated he had had bloodwork drawn tonight and wanted to know results.  Was able to tell him his K+ was wnl, which he appreciated, but was also voicing concern about his liver. Encouraged him to speak with his MD tomorrow, and by then the results should be in.  Trazodone was held this evening d/t notice posted by pharmacy of incompatibility with seroquel, pt was agreeable, was given vistaril for sleep.  PA on call notified and repeat was ordered if needed.  Pt has been sleeping well without repeat.  Attended group, has been interacting appropriately with staff and peers.   A)  Will continue to monitor for safety, continue POC R)  Safety maintained.

## 2013-06-27 NOTE — Progress Notes (Signed)
Recreation Therapy Notes   Date: 08.11.2014 Time: 3:00pm Location: [address] Dayroom  Group Topic: Stress Management  Goal Area(s) Addresses:  Patient will verbalize importance of using healthy stress management.  Patient will identify stress management technique of choice.  Behavioral Response: Did not attend  Jearl Klinefelter, LRT/CTRS  Jearl Klinefelter 06/27/2013 4:19 PM

## 2013-06-27 NOTE — [facility] Group Notes (Signed)
[facility] LCSW Aftercare Discharge Planning Group Note   06/27/2013 12:59 PM    Participation Quality:  Appropraite  Mood/Affect:  Appropriate  Depression Rating:  3  Anxiety Rating:  3  Thoughts of Suicide:  No  Will you contract for safety?   NA  Current AVH:  No  Plan for Discharge/Comments:  Patient attending discharge planning group and actively participated in group.  Patient will need referral for outpatient follow up and is agreeable to [facility].  CSW provided all participants with daily workbooko.   Transportation Means: Patient has transportation.   Supports:  Patient has a support system.   Ruthe Roemer, Joesph July

## 2013-06-27 NOTE — Progress Notes (Signed)
NUTRITION ASSESSMENT  Pt identified as at risk on the Malnutrition Screen Tool  INTERVENTION: 1. Educated patient on the importance of nutrition and encouraged intake of food and beverages. 2. Discussed weight goals. 3. Supplements: continue MVI and thiamine daily  NUTRITION DIAGNOSIS: Unintentional weight loss related to sub-optimal intake as evidenced by pt report.   Goal: Pt to meet >/= 90% of their estimated nutrition needs.  Monitor:  PO intake  Assessment:  Patient admitted for continued etoh abuse. UBW of 229 lbss December of last year.  Weight loss secondary to severe drinking.  Binges and then cant eat for 4 days.  Patient is 87% of IBW.  13% weight loss in the last 8 months.  Patient concerned about elevated glucose at times and about los potassium.  Discussed impact of etoh abuse on nutrition.  52 y.o. male  Height: Ht Readings from Last 1 Encounters:  06/25/13 6' (1.829 m)    Weight: Wt Readings from Last 1 Encounters:  06/25/13 200 lb (90.719 kg)    Weight Hx: Wt Readings from Last 10 Encounters:  06/25/13 200 lb (90.719 kg)  05/18/13 193 lb 2 oz (87.6 kg)  04/29/13 203 lb 11.3 oz (92.4 kg)  04/29/13 203 lb 11.3 oz (92.4 kg)  04/17/13 210 lb (95.255 kg)  04/16/13 208 lb (94.348 kg)  04/13/13 209 lb 10.5 oz (95.1 kg)  02/22/13 211 lb 3.2 oz (95.8 kg)    BMI:  Body mass index is 27.12 kg/(m^2). Pt meets criteria for .overweight based on current BMI.  Estimated Nutritional Needs: Kcal: 25-30 kcal/kg Protein: > 1 gram protein/kg Fluid: 1 ml/kcal  Diet Order: General Pt is also offered choice of unit snacks mid-morning and mid-afternoon.  Pt is eating as desired.   Lab results and medications reviewed. HgbA1C=6.1 02/24/13  Oran Rein, RD, LDN Clinical Inpatient Dietitian Pager:  [phone] Weekend and after hours pager:  [phone]
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
## 2013-06-27 NOTE — Progress Notes (Signed)
Patient ID: Jeffrey Black, male   DOB: 1961-10-11, 52 y.o.   MRN: [id number] [facility] MD Progress Note  06/27/2013 2:00 PM Jeffrey Black  MRN:  [id number]  Subjective:  Patient has been suffering with chronic alcohol dependence and has at least nine [facility] visits during this current year. He was admitted for alcohol relapse and detox treatment. He has complained that he did not sleep and needs his medication adjustment. He has been tolerating his librium protocol and has no apparent withdrawal symptoms today. He may benefit from rehab treatment.   Diagnosis:  Alcohol dependence  ADL's:  Impaired  Sleep: Poor  Appetite:  Good  Suicidal Ideation:  denies Homicidal Ideation:  denies AEB (as evidenced by):  Psychiatric Specialty Exam: Review of Systems  Constitutional: Positive for chills and diaphoresis.  Psychiatric/Behavioral: Positive for depression and substance abuse. The patient is nervous/anxious and has insomnia.     Blood pressure 131/93, pulse 82, temperature 97.5 F (36.4 C), temperature source Oral, resp. rate 18, height 6' (1.829 m), weight 90.719 kg (200 lb), SpO2 98.00%.Body mass index is 27.12 kg/(m^2).  General Appearance: Disheveled  Eye Solicitor::  Fair  Speech:  Clear and Coherent  Volume:  Normal  Mood:  Depressed and Irritable  Affect:  Congruent  Thought Process:  Goal Directed  Orientation:  Full (Time, Place, and Person)  Thought Content:  WDL  Suicidal Thoughts:  No  Homicidal Thoughts:  No  Memory:  Immediate;   Fair  Judgement:  Impaired  Insight:  Lacking  Psychomotor Activity:  Normal  + fine tremors both hands.  Concentration:  Fair  Recall:  Fair  Akathisia:  No  Handed:  Right  AIMS (if indicated):     Assets:  Communication Skills Desire for Improvement Physical Health Resilience  Sleep:  Number of Hours: 6   Current Medications: Current Facility-Administered Medications  Medication Dose Route Frequency Provider Last Rate Last Dose  .  acetaminophen (TYLENOL) tablet 650 mg  650 mg Oral Q6H PRN Court Joy, PA-C   650 mg at 06/25/13 2035  . alum & mag hydroxide-simeth (MAALOX/MYLANTA) 200-200-20 MG/5ML suspension 30 mL  30 mL Oral PRN Court Joy, PA-C      . chlordiazePOXIDE (LIBRIUM) capsule 25 mg  25 mg Oral Q6H PRN Court Joy, PA-C   25 mg at 06/27/13 0956  . chlordiazePOXIDE (LIBRIUM) capsule 25 mg  25 mg Oral TID Court Joy, PA-C   25 mg at 06/27/13 1157   Followed by  . [START ON 06/28/2013] chlordiazePOXIDE (LIBRIUM) capsule 25 mg  25 mg Oral BH-qamhs Court Joy, PA-C       Followed by  . [START ON 06/29/2013] chlordiazePOXIDE (LIBRIUM) capsule 25 mg  25 mg Oral Daily Court Joy, PA-C      . [START ON 06/28/2013] escitalopram (LEXAPRO) tablet 20 mg  20 mg Oral Daily Nehemiah Settle, MD      . gabapentin (NEURONTIN) capsule 300 mg  300 mg Oral TID Rachael Fee, MD   300 mg at 06/27/13 1157  . hydrOXYzine (ATARAX/VISTARIL) tablet 25 mg  25 mg Oral Q6H PRN Court Joy, PA-C   25 mg at 06/26/13 2227  . hydrOXYzine (ATARAX/VISTARIL) tablet 25 mg  25 mg Oral Once Court Joy, PA-C      . loperamide (IMODIUM) capsule 2-4 mg  2-4 mg Oral PRN Court Joy, PA-C      . magnesium hydroxide (MILK OF MAGNESIA)  suspension 30 mL  30 mL Oral Daily PRN Court Joy, PA-C      . metoprolol tartrate (LOPRESSOR) tablet 25 mg  25 mg Oral BID Court Joy, PA-C   25 mg at 06/27/13 1610  . multivitamin with minerals tablet 1 tablet  1 tablet Oral Daily Court Joy, PA-C   1 tablet at 06/27/13 0816  . nicotine (NICODERM CQ - dosed in mg/24 hours) patch 21 mg  21 mg Transdermal Daily Valente David, RN   21 mg at 06/27/13 0817  . ondansetron (ZOFRAN-ODT) disintegrating tablet 4 mg  4 mg Oral Q6H PRN Court Joy, PA-C      . QUEtiapine (SEROQUEL) tablet 100 mg  100 mg Oral QHS Court Joy, PA-C   100 mg at 06/26/13 2200  . QUEtiapine (SEROQUEL) tablet 50 mg  50 mg Oral BID  Rachael Fee, MD   50 mg at 06/27/13 0816  . thiamine (VITAMIN B-1) tablet 100 mg  100 mg Oral Daily Court Joy, PA-C   100 mg at 06/27/13 9604  . traZODone (DESYREL) tablet 50 mg  50 mg Oral QHS PRN Verne Spurr, PA-C        Lab Results:  Results for orders placed during the hospital encounter of 06/25/13 (from the past 48 hour(s))  BASIC METABOLIC PANEL     Status: Abnormal   Collection Time    06/26/13  7:30 PM      Result Value Range   Sodium 140  135 - 145 mEq/L   Potassium 3.8  3.5 - 5.1 mEq/L   Chloride 103  96 - 112 mEq/L   CO2 26  19 - 32 mEq/L   Glucose, Bld 116 (*) 70 - 99 mg/dL   BUN 13  6 - 23 mg/dL   Creatinine, Ser 5.40  0.50 - 1.35 mg/dL   Calcium 8.9  8.4 - 98.1 mg/dL   GFR calc non Af Amer >90  >90 mL/min   GFR calc Af Amer >90  >90 mL/min   Comment:            The eGFR has been calculated     using the CKD EPI equation.     This calculation has not been     validated in all clinical     situations.     eGFR's persistently     <90 mL/min signify     possible Chronic Kidney Disease.     Performed at [facility]    Physical Findings: AIMS: Facial and Oral Movements Muscles of Facial Expression: None, normal Lips and Perioral Area: None, normal Jaw: None, normal Tongue: None, normal,Extremity Movements Upper (arms, wrists, hands, fingers): None, normal Lower (legs, knees, ankles, toes): None, normal, Trunk Movements Neck, shoulders, hips: None, normal, Overall Severity Severity of abnormal movements (highest score from questions above): None, normal Incapacitation due to abnormal movements: None, normal Patient's awareness of abnormal movements (rate only patient's report): No Awareness, Dental Status Current problems with teeth and/or dentures?: No Does patient usually wear dentures?: No  CIWA:  CIWA-Ar Total: 1 COWS:     Treatment Plan Summary: Daily contact with patient to assess and evaluate symptoms and progress in  treatment Medication management  Plan: 1. Continue crisis management and stabilization. 2. Medication management to reduce current symptoms to base line and improve patient's overall level of functioning 3. Treat health problems as indicated. 4. Develop treatment plan to decrease risk of relapse upon  discharge and the need for readmission. 5. Psycho-social education regarding relapse prevention and self care. 6. Health care follow up as needed for medical problems. 7. Continue home medications where appropriate. 8. Continue trazodone for sleep 50mg . X 1 at hs. 9. Reviewed CIWA with patient and symptoms. Increase lexapro 20mg  QD. 10. Reviewed labs and will order BMP to monitor K+  Medical Decision Making Problem Points:  Established problem, stable/improving (1) Data Points:  Review or order clinical lab tests (1) Review or order medicine tests (1)  I certify that inpatient services furnished can reasonably be expected to improve the patient's condition.    Nehemiah Settle., MD 06/27/2013 2:01 PM

## 2013-06-27 NOTE — Progress Notes (Signed)
Psychoeducational Group Note  Date:  06/27/2013 Time:  1100  Group Topic/Focus:  Self Care:   The focus of this group is to help patients understand the importance of self-care in order to improve or restore emotional, physical, spiritual, interpersonal, and financial health.  Participation Level: Did Not Attend  Participation Quality:  Not Applicable  Affect:  Not Applicable  Cognitive:  Not Applicable  Insight:  Not Applicable  Engagement in Group: Not Applicable  Additional Comments:  Patient did not attend group, patient remained in bed.  Karleen Hampshire Brittini 06/27/2013, 7:02 PM

## 2013-06-27 NOTE — Progress Notes (Signed)
Pt observed in the dayroom watching TV and interacting with peers.  Pt reports his withdrawal symptoms are decreasing.  He says he is still somewhat anxious.  He denies SI/HI/AV.  Pt make his needs known to staff.  Pt voices no needs or concerns at this time.  Support and encouragement offered.  Safety maintained with q15 minute checks.

## 2013-06-27 NOTE — Progress Notes (Signed)
Patient ID: Jeffrey Black, male   DOB: 1961-05-01, 52 y.o.   MRN: [id number] D- Patient reports he slept well and his appetite is good.  His energy level is normal and his ability to pay attention is good.  He rates his depression a 4 and his hopelessness at 3.  He denies thoughts of self harm.  His blood pressure is high. Librium given prn at 1000 for elevated BP and tremors.  A  Supported patient.  R- patient is interacting with others and attending groups.  He denies thoughts if self harm.
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
## 2013-06-28 DIAGNOSIS — F6381 Intermittent explosive disorder: Secondary | ICD-10-CM

## 2013-06-28 DIAGNOSIS — F1994 Other psychoactive substance use, unspecified with psychoactive substance-induced mood disorder: Secondary | ICD-10-CM

## 2013-06-28 MED ORDER — QUETIAPINE FUMARATE 200 MG PO TABS
200.0000 mg | ORAL_TABLET | Freq: Every day | ORAL | Status: DC
  Administered 2013-06-28: 200 mg via ORAL
  Filled 2013-06-28: qty 3
  Filled 2013-06-28 (×2): qty 1

## 2013-06-28 MED ORDER — GABAPENTIN 400 MG PO CAPS
400.0000 mg | ORAL_CAPSULE | Freq: Three times a day (TID) | ORAL | Status: DC
  Administered 2013-06-28 – 2013-06-29 (×3): 400 mg via ORAL
  Filled 2013-06-28 (×2): qty 1
  Filled 2013-06-28: qty 9
  Filled 2013-06-28: qty 1
  Filled 2013-06-28 (×2): qty 9
  Filled 2013-06-28 (×2): qty 1

## 2013-06-28 NOTE — [facility] Suicide Risk Assessment (Signed)
[facility] INPATIENT:  Family/Significant Other Suicide Prevention Education  Suicide Prevention Education:  Patient Refusal for Family/Significant Other Suicide Prevention Education: The patient Jeffrey Black has refused to provide written consent for family/significant other to be provided Family/Significant Other Suicide Prevention Education during admission and/or prior to discharge.  Physician notified.  Patient advised his father is elderly and he would not want him to receive the information.  Wynn Banker 06/28/2013, 12:59 PM

## 2013-06-28 NOTE — Progress Notes (Signed)
The focus of this group is to educate the patient on the purpose and policies of crisis stabilization and provide a format to answer questions about their admission.  The group details unit policies and expectations of patients while admitted.  Patient attended 0900 nurse education orientation group.  Patient actively participated, appropriate affect, alert, appropriate insight and engagement.  Patient will work on discharge goals.

## 2013-06-28 NOTE — Progress Notes (Signed)
Recreation Therapy Notes  Date: 08.12.2014 Time: 2:45pm Location: 500 Hall Dayroom  Group Topic: Animal Assisted Activities  Goal Area(s) Addresses:  Patient will interact appropriately with dog team.    Behavioral Response: Did not attend.   Marykay Lex Nolia Tschantz, LRT/CTRS  Aryaa Bunting L 06/28/2013 5:00 PM

## 2013-06-28 NOTE — Progress Notes (Signed)
Chaplain provided brief support with pt on 500 hallway.  Familiar with pt from previous admissions as well as support in [facility] ED on this admission.  Will continue to follow during admission.

## 2013-06-28 NOTE — Progress Notes (Signed)
Patient ID: Jeffrey Black, male   DOB: 10-23-1961, 52 y.o.   MRN: [id number] D-Patient reports he slept fair and says his appetite is improving.  His energy level is normal and his ability to pay attention is good.  He rates his depression at 3 and hopelessness at 2. He denies thoughts of self harm.  He says he feels that he is ready to go.  He is hoping to be discharged tomorrow and says that he will sign out AMA tomorrow if not discharged.  A-Asked patient what his urgency is to discharge and he says he is getting bored and frustrated being here.  R- Encouraged patient to use learned coping skills to practice dealing with the frustration he feels so he will be equipped to deal with frustration after d/c and not turn to drinking.

## 2013-06-28 NOTE — Progress Notes (Signed)
Patient ID: Jeffrey Black, male   DOB: 06-24-1961, 52 y.o.   MRN: [id number] [facility] MD Progress Note  06/28/2013 12:41 PM Jeffrey Black  MRN:  [id number]  Subjective:  Patient complaints of anxiety and coming off of alcohol withdrawal symptoms and tolerating alcohol detox treatment. Patient stated that he need money from his sister and niece who may go out of town and wants to know when he will be discharged and he will be going to half way house as disposition plans. He was denied craving for alcoholism. Patient approached personally requesting medication management for frequent agitation and anger out burst which caused problems with his family in the past. Reportedly he has been tolerating librium protocol and medications and has no apparent withdrawal symptoms. He may benefit from rehab treatment and refuses as he was previously received rehab therapy.    Diagnosis:  Alcohol dependence, substance induced mood disorder, Intermittent explosive disorder  ADL's:  Impaired  Sleep: Poor  Appetite:  Good  Suicidal Ideation:  denies Homicidal Ideation:  denies AEB (as evidenced by):  Psychiatric Specialty Exam: Review of Systems  Constitutional: Positive for chills and diaphoresis.  Psychiatric/Behavioral: Positive for depression and substance abuse. The patient is nervous/anxious and has insomnia.     Blood pressure 116/85, pulse 88, temperature 98.7 F (37.1 C), temperature source Oral, resp. rate 16, height 6' (1.829 m), weight 90.719 kg (200 lb), SpO2 98.00%.Body mass index is 27.12 kg/(m^2).  General Appearance: Disheveled  Eye Solicitor::  Fair  Speech:  Clear and Coherent  Volume:  Normal  Mood:  Depressed and Irritable  Affect:  Congruent  Thought Process:  Goal Directed  Orientation:  Full (Time, Place, and Person)  Thought Content:  WDL  Suicidal Thoughts:  No  Homicidal Thoughts:  No  Memory:  Immediate;   Fair  Judgement:  Impaired  Insight:  Lacking  Psychomotor  Activity:  Normal  + fine tremors both hands.  Concentration:  Fair  Recall:  Fair  Akathisia:  No  Handed:  Right  AIMS (if indicated):     Assets:  Communication Skills Desire for Improvement Physical Health Resilience  Sleep:  Number of Hours: 6.75   Current Medications: Current Facility-Administered Medications  Medication Dose Route Frequency Provider Last Rate Last Dose  . acetaminophen (TYLENOL) tablet 650 mg  650 mg Oral Q6H PRN Court Joy, PA-C   650 mg at 06/25/13 2035  . alum & mag hydroxide-simeth (MAALOX/MYLANTA) 200-200-20 MG/5ML suspension 30 mL  30 mL Oral PRN Court Joy, PA-C      . chlordiazePOXIDE (LIBRIUM) capsule 25 mg  25 mg Oral BH-qamhs Court Joy, PA-C   25 mg at 06/28/13 0741   Followed by  . [START ON 06/29/2013] chlordiazePOXIDE (LIBRIUM) capsule 25 mg  25 mg Oral Daily Court Joy, PA-C      . escitalopram (LEXAPRO) tablet 20 mg  20 mg Oral Daily Nehemiah Settle, MD   20 mg at 06/28/13 0741  . gabapentin (NEURONTIN) capsule 300 mg  300 mg Oral TID Rachael Fee, MD   300 mg at 06/28/13 1145  . hydrOXYzine (ATARAX/VISTARIL) tablet 25 mg  25 mg Oral Once Court Joy, PA-C      . magnesium hydroxide (MILK OF MAGNESIA) suspension 30 mL  30 mL Oral Daily PRN Court Joy, PA-C      . metoprolol tartrate (LOPRESSOR) tablet 25 mg  25 mg Oral BID Court Joy, PA-C  25 mg at 06/28/13 0742  . multivitamin with minerals tablet 1 tablet  1 tablet Oral Daily Court Joy, PA-C   1 tablet at 06/28/13 0981  . nicotine (NICODERM CQ - dosed in mg/24 hours) patch 21 mg  21 mg Transdermal Daily Valente David, RN   21 mg at 06/28/13 0739  . QUEtiapine (SEROQUEL) tablet 100 mg  100 mg Oral QHS Court Joy, PA-C   100 mg at 06/27/13 2210  . QUEtiapine (SEROQUEL) tablet 50 mg  50 mg Oral BID Rachael Fee, MD   50 mg at 06/28/13 0741  . thiamine (VITAMIN B-1) tablet 100 mg  100 mg Oral Daily Court Joy, PA-C   100 mg at  06/28/13 0741  . traZODone (DESYREL) tablet 50 mg  50 mg Oral QHS PRN Verne Spurr, PA-C   50 mg at 06/27/13 2211    Lab Results:  Results for orders placed during the hospital encounter of 06/25/13 (from the past 48 hour(s))  BASIC METABOLIC PANEL     Status: Abnormal   Collection Time    06/26/13  7:30 PM      Result Value Range   Sodium 140  135 - 145 mEq/L   Potassium 3.8  3.5 - 5.1 mEq/L   Chloride 103  96 - 112 mEq/L   CO2 26  19 - 32 mEq/L   Glucose, Bld 116 (*) 70 - 99 mg/dL   BUN 13  6 - 23 mg/dL   Creatinine, Ser 1.91  0.50 - 1.35 mg/dL   Calcium 8.9  8.4 - 47.8 mg/dL   GFR calc non Af Amer >90  >90 mL/min   GFR calc Af Amer >90  >90 mL/min   Comment:            The eGFR has been calculated     using the CKD EPI equation.     This calculation has not been     validated in all clinical     situations.     eGFR's persistently     <90 mL/min signify     possible Chronic Kidney Disease.     Performed at [facility]    Physical Findings: AIMS: Facial and Oral Movements Muscles of Facial Expression: None, normal Lips and Perioral Area: None, normal Jaw: None, normal Tongue: None, normal,Extremity Movements Upper (arms, wrists, hands, fingers): None, normal Lower (legs, knees, ankles, toes): None, normal, Trunk Movements Neck, shoulders, hips: None, normal, Overall Severity Severity of abnormal movements (highest score from questions above): None, normal Incapacitation due to abnormal movements: None, normal Patient's awareness of abnormal movements (rate only patient's report): No Awareness, Dental Status Current problems with teeth and/or dentures?: No Does patient usually wear dentures?: No  CIWA:  CIWA-Ar Total: 1 COWS:     Treatment Plan Summary: Daily contact with patient to assess and evaluate symptoms and progress in treatment Medication management  Plan: 1. Continue crisis management and stabilization. 2. Medication management to  reduce current symptoms to base line and improve patient's overall level of functioning; Discontinue trazodone - concerns about QT prolongation along with seroquel. Continue Librium protocol and CIWA monitoring for alcohol withdrawal, continue Lexapro 20mg  QD Increase Seroquel 200 mg Qhs / mood swings and agitation, increase neurontin 400 mg TID for anxiety. Patient was educated about haldol as he quarries.  3. Treat health problems as indicated. 4. Develop treatment plan to decrease risk of relapse upon discharge and the need for readmission.  5. Psycho-social education regarding relapse prevention and self care. 6. Health care follow up as needed for medical problems. 7. Continue home medications where appropriate. 8. Disposition plans in progress and LOS is 1-2 days  Medical Decision Making Problem Points:  Established problem, stable/improving (1) Data Points:  Review or order clinical lab tests (1) Review or order medicine tests (1)  I certify that inpatient services furnished can reasonably be expected to improve the patient's condition.    Nehemiah Settle., MD 06/28/2013 12:41 PM

## 2013-06-28 NOTE — Progress Notes (Signed)
Pt observed in his room lying in bed awake.  Pt reports he is feeling ok and that he is ready to be discharged.  He reports no withdrawal symptoms at this time.  He denies SI/HI/AV.  He says he can return to the halfway house, [facility], when he is discharged.  Pt says he has been going to groups.  Pt voices no needs or concerns at this time.  Encouraged to discuss his request for discharge with his MD.  Support and encouragement offered.  Safety maintained with q15 minute checks.

## 2013-06-28 NOTE — Progress Notes (Signed)
Agree with assessment and plan Charde Macfarlane A. Zakirah Weingart, M.D. 

## 2013-06-28 NOTE — [facility] Group Notes (Signed)
[facility] LCSW Group Therapy      Feelings About Diagnosis 1:15 - 2:30 PM         06/28/2013    Type of Therapy:  Group Therapy  Participation Level:  Active  Participation Quality:  Appropriate  Affect:  Appropriate  Cognitive:  Alert and Appropriate  Insight:  Developing/Improving and Engaged  Engagement in Therapy:  Developing/Improving and Engaged  Modes of Intervention:  Discussion, Education, Exploration, Problem-Solving, Rapport Building, Support  Summary of Progress/Problems:  Patient actively participated in group. Patient discussed past and present diagnosis and the effects it has had on  life.  Patient talked about family and society being judgmental and the stigma associated with having a mental health diagnosis.   He shared he accepts his illness but will need to assess whether he has to be on medications for the rest of his life.  He talked about problems he has had in the past with side effects from the medications.  Patient encouraged to discuss negative side effects with MD rather than stopping medication.  Wynn Banker 06/28/2013

## 2013-06-28 NOTE — Progress Notes (Signed)
Brief chaplain follow up for continued emotional support on [address].  Pt anticipating discharge to same halfway house he was residing previously. Spoke with chaplain about stresses, including loss of his mother and health of his father.  Spoke about developing his relationship with his sister and niece in order to have support and coping mechanisms outside of consuming alcohol.   Pt positive about outlook, relating stories of members of his AA community who have remained sober for extended periods.  Also feels that the two periods of extended sobriety were when he was on medication management for depression.   Belva Crome MDiv

## 2013-06-29 DIAGNOSIS — F3130 Bipolar disorder, current episode depressed, mild or moderate severity, unspecified: Secondary | ICD-10-CM

## 2013-06-29 MED ORDER — QUETIAPINE FUMARATE 200 MG PO TABS: 200.0000 mg | ORAL_TABLET | Freq: Every day | ORAL | Status: DC

## 2013-06-29 MED ORDER — METOPROLOL TARTRATE 25 MG PO TABS: 25.0000 mg | ORAL_TABLET | Freq: Two times a day (BID) | ORAL | Status: DC

## 2013-06-29 MED ORDER — GABAPENTIN 400 MG PO CAPS: 400.0000 mg | ORAL_CAPSULE | Freq: Three times a day (TID) | ORAL | Status: DC

## 2013-06-29 MED ORDER — QUETIAPINE FUMARATE 50 MG PO TABS: 50.0000 mg | ORAL_TABLET | Freq: Two times a day (BID) | ORAL | Status: DC

## 2013-06-29 MED ORDER — ADULT MULTIVITAMIN W/MINERALS CH: 1.0000 | ORAL_TABLET | Freq: Every day | ORAL | Status: DC

## 2013-06-29 MED ORDER — ESCITALOPRAM OXALATE 20 MG PO TABS: 20.0000 mg | ORAL_TABLET | Freq: Every day | ORAL | Status: DC

## 2013-06-29 NOTE — Progress Notes (Signed)
[facility] Adult Case Management Discharge Plan :  Will you be returning to the same living situation after discharge: Yes,  Patient is returning to his home. At discharge, do you have transportation home?:Yes,  Patient will arrange transportation. Do you have the ability to pay for your medications:Yes,  Patient has insurance.  Release of information consent forms completed and in the chart;  Patient's signature needed at discharge.  Patient to Follow up at: Follow-up Information   Follow up with [facility] On 06/30/2013. (Please go to [facility] walk in clinic on Thursday, June 30, 2013 or any weekday for medication managment.)    Contact information:   [address] [place], [place]   [address]  [phone]      Patient denies SI/HI: Patient no longer endorsing SI/HI or other thoughts of self harm.  Safety Planning and Suicide Prevention discussed:  .Reviewed with all patients during discharge planning group   Marianny Goris, Joesph July 06/29/2013, 10:50 AM

## 2013-06-29 NOTE — Progress Notes (Signed)
Recreation Therapy Notes  Date: 08.13.2014 Time: 3:00pm Location: [address] Dayroom  Group Topic: Communication, Team Building, Problem Solving  Goal Area(s) Addresses:  Patient will be able to recognize use of communication, team building and problem solving during course of group activity. Patient will verbalize need for communication, team building and problem solving to make group activity successful.  Patient will verbalize benefit of communication, team building and problem solving to post d/c goals.   Behavioral Response: Did not attend  Remmy Riffe L Jamon [place], LRT/CTRS  Lakoda Raske L 06/29/2013 4:59 PM 

## 2013-06-29 NOTE — Progress Notes (Signed)
Discharge Note: Discharge instructions/prescriptions/medication samples given to patient. Patient verbalized understanding of discharge instructions and prescriptions. Returned belongings to patient. Denies SI/HI/AVH. Patient d/c without incident to the lobby and transported home by sister. 

## 2013-06-29 NOTE — [facility] Group Notes (Signed)
[facility] LCSW Group Therapy  Emotional Regulation 1:15 - 2: 30 PM        06/29/2013  2:52 PM   Type of Therapy:  Group Therapy  Participation Level:  Appropriate  Participation Quality:  Appropriate  Affect:  Appropriate  Cognitive:  Attentive Appropriate  Insight:  Engaged  Engagement in Therapy:  Engaged  Modes of Intervention:  Discussion Exploration Problem-Solving Supportive  Summary of Progress/Problems:  Group topic was emotional regulations.  Patient participated in the discussion and was able to identify helplessness/frustration  emotions that needed to regulated.  Patient shared when he feels helplessness he tends to drink.  Patient was able to identify approprite coping skills.  Wynn Banker 06/29/2013 2:52 PM

## 2013-06-29 NOTE — [facility] Group Notes (Signed)
[facility] LCSW Aftercare Discharge Planning Group Note   06/29/2013 2:51 PM    Participation Quality:  Appropraite  Mood/Affect:  Appropriate  Depression Rating:  3  Anxiety Rating:  3  Thoughts of Suicide:  No  Will you contract for safety?   NA  Current AVH:  No  Plan for Discharge/Comments:  Patient attending discharge planning group and actively participated in group.  Patient advised he is willing to follow up with [facility] for medeication management.  CSW provided all participants with daily workbook and information on services offered by [facility].   Transportation Means: Patient has transportation.   Supports:  Patient has a support system.   Hailley Byers, Joesph July

## 2013-06-29 NOTE — [facility] Suicide Risk Assessment (Signed)
Suicide Risk Assessment  Discharge Assessment     Demographic Factors:  Male, Adolescent or young adult, Caucasian, Low socioeconomic status, Living alone and Unemployed  Mental Status Per Nursing Assessment::   On Admission:  NA (denies any si/hi//av)  Current Mental Status by Physician: Mental Status Examination: Patient appeared as per his stated age, casually dressed, and fairly groomed, and maintaining good eye contact. Patient has good mood and his affect was constricted. He has normal rate, rhythm, and volume of speech. His thought process is linear and goal directed. Patient has denied suicidal, homicidal ideations, intentions or plans. Patient has no evidence of auditory or visual hallucinations, delusions, and paranoia. Patient has fair insight judgment and impulse control.  Loss Factors: Decrease in vocational status and Financial problems/change in socioeconomic status  Historical Factors: Prior suicide attempts, Family history of mental illness or substance abuse and Impulsivity  Risk Reduction Factors:   Sense of responsibility to family, Religious beliefs about death, Positive social support, Positive therapeutic relationship and Positive coping skills or problem solving skills  Continued Clinical Symptoms:  Bipolar Disorder:   Mixed State Depression:   Comorbid alcohol abuse/dependence Recent sense of peace/wellbeing Alcohol/Substance Abuse/Dependencies Personality Disorders:   Comorbid alcohol abuse/dependence Unstable or Poor Therapeutic Relationship Previous Psychiatric Diagnoses and Treatments Medical Diagnoses and Treatments/Surgeries  Cognitive Features That Contribute To Risk:  Polarized thinking Thought constriction (tunnel vision)    Suicide Risk:  Mild:  Suicidal ideation of limited frequency, intensity, duration, and specificity.  There are no identifiable plans, no associated intent, mild dysphoria and related symptoms, good self-control (both  objective and subjective assessment), few other risk factors, and identifiable protective factors, including available and accessible social support.  Discharge Diagnoses:   AXIS I:  Bipolar, Depressed, Substance Induced Mood Disorder and Alcohol dependence AXIS II:  Personality Disorder NOS AXIS III:   Past Medical History  Diagnosis Date  . ETOH abuse   . Psoriasis   . Hypertension    AXIS IV:  economic problems, housing problems, occupational problems, other psychosocial or environmental problems, problems related to social environment and problems with primary support group AXIS V:  51-60 moderate symptoms  Plan Of Care/Follow-up recommendations:  Activity:  As tolerated Diet:  Regular  Is patient on multiple antipsychotic therapies at discharge:  No   Has Patient had three or more failed trials of antipsychotic monotherapy by history:  No  Recommended Plan for Multiple Antipsychotic Therapies: Not applicable  Nehemiah Settle., M.D. 06/29/2013, 12:27 PM

## 2013-06-29 NOTE — Tx Team (Addendum)
Interdisciplinary Treatment Plan Update   Date Reviewed:  06/29/2013  Time Reviewed:  9:46 AM  Progress in Treatment:   Attending groups: Yes Participating in groups: Yes Taking medication as prescribed: Yes  Tolerating medication: Yes Family/Significant other contact made: No, Patient declined family contact. Patient understands diagnosis: Yes  Discussing patient identified problems/goals with staff: Yes Medical problems stabilized or resolved: Yes Denies suicidal/homicidal ideation: Yes Patient has not harmed self or others: Yes  For review of initial/current patient goals, please see plan of care.  Estimated Length of Stay:  Discharge home today  Reasons for Continued Hospitalization:   New Problems/Goals identified:    Discharge Plan or Barriers:   Home with outpatient follow up at [facility]  Additional Comments:  N/A   Attendees:  Patient:  Jeffrey Black 06/29/2013 9:46 AM   Signature: Mervyn Gay, MD 06/29/2013 9:46 AM  Signature:  Verne Spurr, PA 06/29/2013 9:46 AM  Signature:  06/29/2013 9:46 AM  Signature: Harold Barban, RN 06/29/2013 9:46 AM  Signature:  Chinita Greenland RN 06/29/2013 9:46 AM  Signature:  Juline Patch, LCSW 06/29/2013 9:46 AM  Signature:  Reyes Ivan, LCSW 06/29/2013 9:46 AM  Signature:  Maseta Dorley,Care Coordinator 06/29/2013 9:46 AM  Signature: 06/29/2013 9:46 AM  Signature:    Signature:    Signature:      Scribe for Treatment Team:   Juline Patch,  06/29/2013 9:46 AM

## 2013-06-29 NOTE — Discharge Summary (Signed)
Physician Discharge Summary Note  Patient:  Jeffrey Black is an 52 y.o., male MRN:  [id number] DOB:  October 02, 1961 Patient phone:  [phone] (home)  Patient address:   [address] [place] [address],   Date of Admission:  06/25/2013 Date of Discharge: 8/13/20014  Reason for Admission:  Detox  Discharge Diagnoses: Active Problems:   * No active hospital problems. *  Review of Systems  Constitutional: Negative.  Negative for fever, chills, weight loss, malaise/fatigue and diaphoresis.  HENT: Negative for congestion and sore throat.   Eyes: Negative for blurred vision, double vision and photophobia.  Respiratory: Negative for cough, shortness of breath and wheezing.   Cardiovascular: Negative for chest pain, palpitations and PND.  Gastrointestinal: Negative for heartburn, nausea, vomiting, abdominal pain, diarrhea and constipation.  Musculoskeletal: Negative for myalgias, joint pain and falls.  Neurological: Negative for dizziness, tingling, tremors, sensory change, speech change, focal weakness, seizures, loss of consciousness, weakness and headaches.  Endo/Heme/Allergies: Negative for polydipsia. Does not bruise/bleed easily.  Psychiatric/Behavioral: Negative for depression, suicidal ideas, hallucinations, memory loss and substance abuse. The patient is not nervous/anxious and does not have insomnia.   Discharge Diagnoses:  AXIS I: Bipolar, Depressed, Substance Induced Mood Disorder and Alcohol dependence  AXIS II: Personality Disorder NOS  AXIS III:  Past Medical History   Diagnosis  Date   .  ETOH abuse    .  Psoriasis    .  Hypertension     AXIS IV: economic problems, housing problems, occupational problems, other psychosocial or environmental problems, problems related to social environment and problems with primary support group  AXIS V: 51-60 moderate symptoms   Level of Care:  OP  Hospital Course:  Jeffrey Black was admitted voluntarily after he presented to the  [facility] BIB police where he requested detox from alcohol.  He stated that had started to drink on Wednesday as he felt that he was not getting the help he needed on his recent hospital admission so he signed out AMA.  He was evaluated in the [facility] where he was found to have a BAL of 383. His sodium was slightly elevated at  148 and his potassium was low at 3.4. He was started on a Librium protocol and monitored until his BAL was down to 138.  He was accepted to [facility] and transferred over for further completion of detox.       Jeffrey Black was admitted for alcohol detox and crisis management.  He was treated with the standard protocol for detox.  Medical problems were identified and treated.  Home medications was restarted as appropriate.  Lexapro was started for his depression and quickly titrated to 20mg  per day. Seroquel was initiated for agitation and mood stabilization as well as for depression.  He was given 50mg  BID to help with agitation, and 100mg  at hs for sleep and mood stabilization.       Improvement was monitored by CIWA scores and patient's daily report of withdrawal symptom reduction.  Emotional and mental status was monitored by daily self inventory reports completed by the patient and clinical staff.  The patient was evaluated by the treatment team for stability and plans for continued recovery upon discharge.  He was offered further treatment options upon discharge including Residential, Intensive Outpatient and Outpatient treatment.       The patient's motivation was an integral factor for scheduling further treatment. Employment, transportation, bed availability, health status, family support, and any pending legal issues were also considered.  He declined  any further treatment or rehab and elected to follow up as noted below. Upon completion of detox the patient was both and mentally and medically stable for discharge.  Consults:  None  Significant Diagnostic Studies:  None Discharge Vitals:   Blood  pressure 126/85, pulse 89, temperature 98.4 F (36.9 C), temperature source Oral, resp. rate 16, height 6' (1.829 m), weight 90.719 kg (200 lb), SpO2 98.00%. Body mass index is 27.12 kg/(m^2). Lab Results:   Results for orders placed during the hospital encounter of 06/25/13 (from the past 72 hour(s))  BASIC METABOLIC PANEL     Status: Abnormal   Collection Time    06/26/13  7:30 PM      Result Value Range   Sodium 140  135 - 145 mEq/L   Potassium 3.8  3.5 - 5.1 mEq/L   Chloride 103  96 - 112 mEq/L   CO2 26  19 - 32 mEq/L   Glucose, Bld 116 (*) 70 - 99 mg/dL   BUN 13  6 - 23 mg/dL   Creatinine, Ser 1.61  0.50 - 1.35 mg/dL   Calcium 8.9  8.4 - 09.6 mg/dL   GFR calc non Af Amer >90  >90 mL/min   GFR calc Af Amer >90  >90 mL/min   Comment:            The eGFR has been calculated     using the CKD EPI equation.     This calculation has not been     validated in all clinical     situations.     eGFR's persistently     <90 mL/min signify     possible Chronic Kidney Disease.     Performed at [facility]    Physical Findings: AIMS: Facial and Oral Movements Muscles of Facial Expression: None, normal Lips and Perioral Area: None, normal Jaw: None, normal Tongue: None, normal,Extremity Movements Upper (arms, wrists, hands, fingers): None, normal Lower (legs, knees, ankles, toes): None, normal, Trunk Movements Neck, shoulders, hips: None, normal, Overall Severity Severity of abnormal movements (highest score from questions above): None, normal Incapacitation due to abnormal movements: None, normal Patient's awareness of abnormal movements (rate only patient's report): No Awareness, Dental Status Current problems with teeth and/or dentures?: No Does patient usually wear dentures?: No  CIWA:  CIWA-Ar Total: 7 COWS:     Psychiatric Specialty Exam: See Psychiatric Specialty Exam and Suicide Risk Assessment completed by Attending Physician prior to  discharge.  Discharge destination:  Home  Is patient on multiple antipsychotic therapies at discharge:  No   Has Patient had three or more failed trials of antipsychotic monotherapy by history:  No  Recommended Plan for Multiple Antipsychotic Therapies: NA  Discharge Orders   Future Orders Complete By Expires   Diet - low sodium heart healthy  As directed    Discharge instructions  As directed    Comments:     Take all of your medications as directed. Be sure to keep all of your follow up appointments.  If you are unable to keep your follow up appointment, call your Doctor's office to let them know, and reschedule.  Make sure that you have enough medication to last until your appointment. Be sure to get plenty of rest. Going to bed at the same time each night will help. Try to avoid sleeping during the day.  Increase your activity as tolerated. Regular exercise will help you to sleep better and improve your mental health. Eating  a heart healthy diet is recommended. Try to avoid salty or fried foods. Be sure to avoid all alcohol and illegal drugs.   Increase activity slowly  As directed        Medication List       Indication   escitalopram 20 MG tablet  Commonly known as:  LEXAPRO  Take 1 tablet (20 mg total) by mouth daily.   Indication:  Generalized Anxiety Disorder     gabapentin 400 MG capsule  Commonly known as:  NEURONTIN  Take 1 capsule (400 mg total) by mouth 3 (three) times daily.   Indication:  Aggressive Behavior, Agitation, Neuropathic Pain     metoprolol tartrate 25 MG tablet  Commonly known as:  LOPRESSOR  Take 1 tablet (25 mg total) by mouth 2 (two) times daily.   Indication:  Feeling Anxious, High Blood Pressure     multivitamin with minerals Tabs tablet  Take 1 tablet by mouth daily.    nutritional supplement   QUEtiapine 200 MG tablet  Commonly known as:  SEROQUEL  Take 1 tablet (200 mg total) by mouth at bedtime.   Indication:  Trouble Sleeping, Manic  Phase of Manic-Depression, Mood control     QUEtiapine 50 MG tablet  Commonly known as:  SEROQUEL  Take 1 tablet (50 mg total) by mouth 2 (two) times daily.   Indication:  Manic Phase of Manic-Depression           Follow-up Information   Follow up with [facility] On 06/30/2013. (Please go to [facility] walk in clinic on Thursday, June 30, 2013 or any weekday for medication managment.)    Contact information:   [address] [place], [place]   [address]  [phone]      Follow-up recommendations:   Activities: Resume activity as tolerated. Diet: Heart healthy low sodium diet Tests: Follow up testing will be determined by your out patient provider.  Comments:    Total Discharge Time:  Greater than 30 minutes.  Signed: Rona Ravens. Mashburn RPAC 11:36 AM 06/29/2013  Reviewed the information documented and agree with the treatment plan.  Mailee Klaas,JANARDHAHA R. 07/03/2013 1:35 PM

## 2013-07-05 NOTE — Progress Notes (Signed)
Patient Discharge Instructions:  After Visit Summary (AVS):   Faxed to:  07/05/13 Discharge Summary Note:   Faxed to:  07/05/13 Psychiatric Admission Assessment Note:   Faxed to:  07/05/13 Suicide Risk Assessment - Discharge Assessment:   Faxed to:  07/05/13 Faxed/Sent to the Next Level Care provider:  07/05/13 Faxed to [facility] @ [phone]  Jerelene Redden, 07/05/2013, 3:54 PM

## 2013-07-14 ENCOUNTER — Encounter (HOSPITAL_COMMUNITY): Payer: Self-pay | Admitting: Emergency Medicine

## 2013-07-14 ENCOUNTER — Encounter (HOSPITAL_COMMUNITY): Payer: Self-pay

## 2013-07-14 ENCOUNTER — Emergency Department (HOSPITAL_COMMUNITY): Payer: No Typology Code available for payment source

## 2013-07-14 ENCOUNTER — Emergency Department (HOSPITAL_COMMUNITY)
Admission: EM | Admit: 2013-07-14 | Discharge: 2013-07-14 | Disposition: A | Discharge status: Home / Self Care | Payer: No Typology Code available for payment source | Attending: Emergency Medicine | Admitting: Emergency Medicine

## 2013-07-14 ENCOUNTER — Emergency Department (HOSPITAL_COMMUNITY)
Admission: EM | Admit: 2013-07-14 | Discharge: 2013-07-15 | Disposition: A | Discharge status: Home / Self Care | Payer: No Typology Code available for payment source | Attending: Emergency Medicine | Admitting: Emergency Medicine

## 2013-07-14 DIAGNOSIS — F10929 Alcohol use, unspecified with intoxication, unspecified: Secondary | ICD-10-CM

## 2013-07-14 DIAGNOSIS — Y92410 Unspecified street and highway as the place of occurrence of the external cause: Secondary | ICD-10-CM | POA: Insufficient documentation

## 2013-07-14 DIAGNOSIS — I10 Essential (primary) hypertension: Secondary | ICD-10-CM | POA: Insufficient documentation

## 2013-07-14 DIAGNOSIS — Z79899 Other long term (current) drug therapy: Secondary | ICD-10-CM | POA: Insufficient documentation

## 2013-07-14 DIAGNOSIS — Y9389 Activity, other specified: Secondary | ICD-10-CM | POA: Insufficient documentation

## 2013-07-14 DIAGNOSIS — IMO0002 Reserved for concepts with insufficient information to code with codable children: Secondary | ICD-10-CM | POA: Insufficient documentation

## 2013-07-14 DIAGNOSIS — F17200 Nicotine dependence, unspecified, uncomplicated: Secondary | ICD-10-CM | POA: Insufficient documentation

## 2013-07-14 DIAGNOSIS — F919 Conduct disorder, unspecified: Secondary | ICD-10-CM | POA: Insufficient documentation

## 2013-07-14 DIAGNOSIS — S298XXA Other specified injuries of thorax, initial encounter: Secondary | ICD-10-CM | POA: Insufficient documentation

## 2013-07-14 DIAGNOSIS — F10229 Alcohol dependence with intoxication, unspecified: Secondary | ICD-10-CM | POA: Insufficient documentation

## 2013-07-14 DIAGNOSIS — Z872 Personal history of diseases of the skin and subcutaneous tissue: Secondary | ICD-10-CM | POA: Insufficient documentation

## 2013-07-14 DIAGNOSIS — R61 Generalized hyperhidrosis: Secondary | ICD-10-CM | POA: Insufficient documentation

## 2013-07-14 DIAGNOSIS — I959 Hypotension, unspecified: Secondary | ICD-10-CM | POA: Insufficient documentation

## 2013-07-14 DIAGNOSIS — F1010 Alcohol abuse, uncomplicated: Secondary | ICD-10-CM | POA: Insufficient documentation

## 2013-07-14 DIAGNOSIS — Z043 Encounter for examination and observation following other accident: Secondary | ICD-10-CM | POA: Insufficient documentation

## 2013-07-14 DIAGNOSIS — Z9889 Other specified postprocedural states: Secondary | ICD-10-CM | POA: Insufficient documentation

## 2013-07-14 DIAGNOSIS — F29 Unspecified psychosis not due to a substance or known physiological condition: Secondary | ICD-10-CM | POA: Insufficient documentation

## 2013-07-14 LAB — CBC
MCH: 31.4 pg (ref 26.0–34.0)
MCHC: 33.6 g/dL (ref 30.0–36.0)
Platelets: 345 10*3/uL (ref 150–400)
RDW: 13.8 % (ref 11.5–15.5)

## 2013-07-14 LAB — CBC WITH DIFFERENTIAL/PLATELET
Basophils Absolute: 0.1 10*3/uL (ref 0.0–0.1)
Eosinophils Absolute: 0.4 10*3/uL (ref 0.0–0.7)
Eosinophils Relative: 4 % (ref 0–5)
MCH: 31.1 pg (ref 26.0–34.0)
MCV: 93.1 fL (ref 78.0–100.0)
Platelets: 307 10*3/uL (ref 150–400)
RDW: 13.9 % (ref 11.5–15.5)
WBC: 10 10*3/uL (ref 4.0–10.5)

## 2013-07-14 LAB — COMPREHENSIVE METABOLIC PANEL
ALT: 20 U/L (ref 0–53)
AST: 18 U/L (ref 0–37)
Albumin: 3.7 g/dL (ref 3.5–5.2)
Calcium: 8.9 mg/dL (ref 8.4–10.5)
Sodium: 138 mEq/L (ref 135–145)
Total Protein: 6.7 g/dL (ref 6.0–8.3)

## 2013-07-14 LAB — URINALYSIS, ROUTINE W REFLEX MICROSCOPIC
Bilirubin Urine: NEGATIVE
Glucose, UA: NEGATIVE mg/dL
Hgb urine dipstick: NEGATIVE
Nitrite: NEGATIVE
Specific Gravity, Urine: 1.002 — ABNORMAL LOW (ref 1.005–1.030)
pH: 6.5 (ref 5.0–8.0)

## 2013-07-14 LAB — PROTIME-INR
INR: 0.98 (ref 0.00–1.49)
Prothrombin Time: 12.8 seconds (ref 11.6–15.2)

## 2013-07-14 MED ORDER — SODIUM CHLORIDE 0.9 % IV BOLUS (SEPSIS)
1000.0000 mL | Freq: Once | INTRAVENOUS | Status: AC
  Administered 2013-07-14: 1000 mL via INTRAVENOUS

## 2013-07-14 NOTE — ED Notes (Signed)
Pt remained cooperative although he did pull out his IV when he was beginning third liter NS.  No further complaints.
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
## 2013-07-14 NOTE — ED Notes (Signed)
Warm blanket given to pt
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
## 2013-07-14 NOTE — ED Notes (Signed)
[employer] police badge 29 at bedside.
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
## 2013-07-14 NOTE — ED Notes (Addendum)
General assessment unchanged.  Pt cooperative, second liter NS infused.  C/O pain pain in head posterior that began after incident.

## 2013-07-14 NOTE — ED Notes (Signed)
[employer] police badge 614 presented signed search warrant for blood draw, copied for medical records.  Lab personnel [place], here to draw legal blood with kit presented by [facility] police.
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
## 2013-07-15 ENCOUNTER — Emergency Department (HOSPITAL_COMMUNITY): Payer: No Typology Code available for payment source

## 2013-07-15 LAB — COMPREHENSIVE METABOLIC PANEL
ALT: 23 U/L (ref 0–53)
AST: 19 U/L (ref 0–37)
Calcium: 8.7 mg/dL (ref 8.4–10.5)
Sodium: 144 mEq/L (ref 135–145)
Total Protein: 7.2 g/dL (ref 6.0–8.3)

## 2013-07-15 LAB — RAPID URINE DRUG SCREEN, HOSP PERFORMED
Barbiturates: NOT DETECTED
Benzodiazepines: NOT DETECTED
Cocaine: NOT DETECTED
Opiates: NOT DETECTED
Tetrahydrocannabinol: NOT DETECTED

## 2013-07-15 MED ORDER — QUETIAPINE FUMARATE 100 MG PO TABS
200.0000 mg | ORAL_TABLET | Freq: Once | ORAL | Status: AC
  Administered 2013-07-15: 200 mg via ORAL
  Filled 2013-07-15: qty 2

## 2013-07-15 MED ORDER — LORAZEPAM 1 MG PO TABS
0.0000 mg | ORAL_TABLET | Freq: Four times a day (QID) | ORAL | Status: DC
  Administered 2013-07-15: 2 mg via ORAL
  Filled 2013-07-15 (×2): qty 2

## 2013-07-15 MED ORDER — LORAZEPAM 1 MG PO TABS
0.0000 mg | ORAL_TABLET | Freq: Two times a day (BID) | ORAL | Status: DC
Start: 2013-07-17 — End: 2013-07-15

## 2013-07-15 MED ORDER — ADULT MULTIVITAMIN W/MINERALS CH
1.0000 | ORAL_TABLET | Freq: Every day | ORAL | Status: DC
  Administered 2013-07-15: 1 via ORAL
  Filled 2013-07-15: qty 1

## 2013-07-15 MED ORDER — VITAMIN B-1 100 MG PO TABS
100.0000 mg | ORAL_TABLET | Freq: Every day | ORAL | Status: DC
  Administered 2013-07-15: 100 mg via ORAL
  Filled 2013-07-15: qty 1

## 2013-07-15 MED ORDER — FOLIC ACID 1 MG PO TABS
1.0000 mg | ORAL_TABLET | Freq: Every day | ORAL | Status: DC
  Administered 2013-07-15: 1 mg via ORAL
  Filled 2013-07-15: qty 1

## 2013-07-15 MED ORDER — THIAMINE HCL 100 MG/ML IJ SOLN: 100.0000 mg | Freq: Every day | INTRAMUSCULAR | Status: DC

## 2013-07-15 MED ORDER — LORAZEPAM 1 MG PO TABS
1.0000 mg | ORAL_TABLET | Freq: Four times a day (QID) | ORAL | Status: DC | PRN
  Administered 2013-07-15: 1 mg via ORAL
  Filled 2013-07-15: qty 1

## 2013-07-15 MED ORDER — LORAZEPAM 2 MG/ML IJ SOLN: 1.0000 mg | Freq: Four times a day (QID) | INTRAMUSCULAR | Status: DC | PRN

## 2013-07-15 NOTE — ED Notes (Signed)
telepsych with Venda Rodes being done now, MHT at bedside.

## 2013-07-15 NOTE — ED Notes (Signed)
Jeffrey Black notified of telepsych, act and consult to psychiatry. He was confused like Clinical research associate as PA told him earlier pt was discharging. When Clinical research associate asked RN giving report what changed in reference to the notes stating pt going to jail and being watched for suicide there vs here she said those notes were from a different visit and he's done with police.
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
## 2013-07-15 NOTE — ED Notes (Signed)
Talked to Venda Rodes, we are going to wait to do first opinion on pt as there's been some back and forth about him staying or going home.

## 2013-07-15 NOTE — ED Notes (Signed)
EDP Campos seen pt and rescinded IVC papers as he is discharging pt home.
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
## 2013-07-15 NOTE — ED Notes (Signed)
Writer tried to go over discharge papers with pt but he said he's too tired, he can't get a ride for an hour or two and just leave discharge papers at bedside.
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
## 2013-07-15 NOTE — ED Notes (Signed)
Pt is very irritable and rude. He initially dismissed Clinical research associate until Clinical research associate said ok so you'd like me to leave now without completing my assessment that would allow me to give you medication as you've requested? He said I'm detoxing, going through withdrawals and feel like shit, ok?! Writer said unfortunately more details than that are required in order to get him properly and he said like what? Writer said she was trying to ask him the questions. Pt said he's having +HI thoughts but wouldn't say toward whom but he's very angry sister was able to take out IVC papers on him without even seeing him and he said he hasn't even begun to be aggressive when asked about being aggressive with the family and what it looks like for him. He said "I just don't give a shit anymore if I live or die so I guess that makes me suicidal". Pt said he was clean for 5 wks after leaving [facility] and lives in a half way house and has been going to [employer] Pt wouldn't say what caused him to relapse. He says if we don't medicate him well we will have some problems. He reports having DT's and seizures when detoxing in the past. Pt reports a high tolerance to all medications therefore requiring higher doses of any medication given to him for any reason. Pt requested Ativan 4mg  and morphine for his chest pain.

## 2013-07-15 NOTE — ED Provider Notes (Signed)
Medical screening examination/treatment/procedure(s) were conducted as a shared visit with non-physician practitioner(s) and myself.  I personally evaluated the patient during the encounter  Patient does not appear to be a threat to himself or to others at this time.  He does have an issue with alcohol abuse and he will seek outpatient treatment for this.  No indication for involuntary commitment.  I reversed the IVC paperwork.  Lyanne Co, MD 07/15/13 [phone]
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
## 2013-07-23 ENCOUNTER — Encounter (HOSPITAL_COMMUNITY): Payer: Self-pay | Admitting: Emergency Medicine

## 2013-07-23 ENCOUNTER — Inpatient Hospital Stay (HOSPITAL_COMMUNITY)
Admission: EM | Admit: 2013-07-23 | Discharge: 2013-07-30 | DRG: 897 | Disposition: A | Discharge status: Home / Self Care | Payer: PRIVATE HEALTH INSURANCE | Attending: Internal Medicine | Admitting: Internal Medicine

## 2013-07-23 ENCOUNTER — Emergency Department (HOSPITAL_COMMUNITY): Payer: PRIVATE HEALTH INSURANCE

## 2013-07-23 DIAGNOSIS — F10129 Alcohol abuse with intoxication, unspecified: Secondary | ICD-10-CM

## 2013-07-23 DIAGNOSIS — D61818 Other pancytopenia: Secondary | ICD-10-CM

## 2013-07-23 DIAGNOSIS — Z9119 Patient's noncompliance with other medical treatment and regimen: Secondary | ICD-10-CM

## 2013-07-23 DIAGNOSIS — R748 Abnormal levels of other serum enzymes: Secondary | ICD-10-CM

## 2013-07-23 DIAGNOSIS — F10239 Alcohol dependence with withdrawal, unspecified: Secondary | ICD-10-CM

## 2013-07-23 DIAGNOSIS — D696 Thrombocytopenia, unspecified: Secondary | ICD-10-CM | POA: Diagnosis present

## 2013-07-23 DIAGNOSIS — N390 Urinary tract infection, site not specified: Secondary | ICD-10-CM | POA: Diagnosis not present

## 2013-07-23 DIAGNOSIS — F10229 Alcohol dependence with intoxication, unspecified: Secondary | ICD-10-CM

## 2013-07-23 DIAGNOSIS — R509 Fever, unspecified: Secondary | ICD-10-CM | POA: Diagnosis not present

## 2013-07-23 DIAGNOSIS — S3720XA Unspecified injury of bladder, initial encounter: Secondary | ICD-10-CM | POA: Diagnosis not present

## 2013-07-23 DIAGNOSIS — K2920 Alcoholic gastritis without bleeding: Secondary | ICD-10-CM | POA: Diagnosis present

## 2013-07-23 DIAGNOSIS — E872 Acidosis, unspecified: Secondary | ICD-10-CM | POA: Diagnosis present

## 2013-07-23 DIAGNOSIS — F32A Depression, unspecified: Secondary | ICD-10-CM | POA: Diagnosis present

## 2013-07-23 DIAGNOSIS — R933 Abnormal findings on diagnostic imaging of other parts of digestive tract: Secondary | ICD-10-CM

## 2013-07-23 DIAGNOSIS — Z79899 Other long term (current) drug therapy: Secondary | ICD-10-CM

## 2013-07-23 DIAGNOSIS — R319 Hematuria, unspecified: Secondary | ICD-10-CM | POA: Diagnosis not present

## 2013-07-23 DIAGNOSIS — F17200 Nicotine dependence, unspecified, uncomplicated: Secondary | ICD-10-CM | POA: Diagnosis present

## 2013-07-23 DIAGNOSIS — R7881 Bacteremia: Secondary | ICD-10-CM | POA: Diagnosis not present

## 2013-07-23 DIAGNOSIS — R7989 Other specified abnormal findings of blood chemistry: Secondary | ICD-10-CM

## 2013-07-23 DIAGNOSIS — F10931 Alcohol use, unspecified with withdrawal delirium: Principal | ICD-10-CM | POA: Diagnosis present

## 2013-07-23 DIAGNOSIS — F1010 Alcohol abuse, uncomplicated: Secondary | ICD-10-CM | POA: Diagnosis present

## 2013-07-23 DIAGNOSIS — I10 Essential (primary) hypertension: Secondary | ICD-10-CM | POA: Diagnosis present

## 2013-07-23 DIAGNOSIS — L408 Other psoriasis: Secondary | ICD-10-CM | POA: Diagnosis present

## 2013-07-23 DIAGNOSIS — R000 Tachycardia, unspecified: Secondary | ICD-10-CM | POA: Diagnosis present

## 2013-07-23 DIAGNOSIS — X58XXXA Exposure to other specified factors, initial encounter: Secondary | ICD-10-CM | POA: Diagnosis not present

## 2013-07-23 DIAGNOSIS — R809 Proteinuria, unspecified: Secondary | ICD-10-CM

## 2013-07-23 DIAGNOSIS — S3730XA Unspecified injury of urethra, initial encounter: Secondary | ICD-10-CM | POA: Diagnosis not present

## 2013-07-23 DIAGNOSIS — D649 Anemia, unspecified: Secondary | ICD-10-CM

## 2013-07-23 DIAGNOSIS — F1020 Alcohol dependence, uncomplicated: Secondary | ICD-10-CM | POA: Diagnosis present

## 2013-07-23 DIAGNOSIS — F10929 Alcohol use, unspecified with intoxication, unspecified: Secondary | ICD-10-CM

## 2013-07-23 DIAGNOSIS — R9431 Abnormal electrocardiogram [ECG] [EKG]: Secondary | ICD-10-CM | POA: Diagnosis present

## 2013-07-23 DIAGNOSIS — F319 Bipolar disorder, unspecified: Secondary | ICD-10-CM | POA: Diagnosis present

## 2013-07-23 DIAGNOSIS — Z91199 Patient's noncompliance with other medical treatment and regimen due to unspecified reason: Secondary | ICD-10-CM

## 2013-07-23 DIAGNOSIS — F10231 Alcohol dependence with withdrawal delirium: Principal | ICD-10-CM | POA: Diagnosis present

## 2013-07-23 DIAGNOSIS — E876 Hypokalemia: Secondary | ICD-10-CM | POA: Diagnosis present

## 2013-07-23 DIAGNOSIS — L409 Psoriasis, unspecified: Secondary | ICD-10-CM | POA: Diagnosis present

## 2013-07-23 DIAGNOSIS — F329 Major depressive disorder, single episode, unspecified: Secondary | ICD-10-CM | POA: Diagnosis present

## 2013-07-23 DIAGNOSIS — D62 Acute posthemorrhagic anemia: Secondary | ICD-10-CM | POA: Diagnosis not present

## 2013-07-23 DIAGNOSIS — E860 Dehydration: Secondary | ICD-10-CM | POA: Diagnosis present

## 2013-07-23 LAB — CK TOTAL AND CKMB (NOT AT ARMC)
CK, MB: 8.4 ng/mL (ref 0.3–4.0)
Relative Index: 0.4 (ref 0.0–2.5)
Total CK: 2280 U/L — ABNORMAL HIGH (ref 7–232)

## 2013-07-23 LAB — LACTIC ACID, PLASMA: Lactic Acid, Venous: 1 mmol/L (ref 0.5–2.2)

## 2013-07-23 LAB — CBC WITH DIFFERENTIAL/PLATELET
Basophils Absolute: 0 10*3/uL (ref 0.0–0.1)
Basophils Relative: 0 % (ref 0–1)
Eosinophils Absolute: 0 10*3/uL (ref 0.0–0.7)
Hemoglobin: 14.5 g/dL (ref 13.0–17.0)
MCH: 31.5 pg (ref 26.0–34.0)
MCHC: 34.1 g/dL (ref 30.0–36.0)
Monocytes Relative: 7 % (ref 3–12)
Neutro Abs: 13 10*3/uL — ABNORMAL HIGH (ref 1.7–7.7)
Neutrophils Relative %: 85 % — ABNORMAL HIGH (ref 43–77)
RDW: 13.9 % (ref 11.5–15.5)

## 2013-07-23 LAB — URINE MICROSCOPIC-ADD ON

## 2013-07-23 LAB — POCT I-STAT, CHEM 8
BUN: 32 mg/dL — ABNORMAL HIGH (ref 6–23)
Calcium, Ion: 1.01 mmol/L — ABNORMAL LOW (ref 1.12–1.23)
Chloride: 108 mEq/L (ref 96–112)
Creatinine, Ser: 1.4 mg/dL — ABNORMAL HIGH (ref 0.50–1.35)
HCT: 36 % — ABNORMAL LOW (ref 39.0–52.0)
Hemoglobin: 12.2 g/dL — ABNORMAL LOW (ref 13.0–17.0)
Potassium: 4.4 mEq/L (ref 3.5–5.1)
Sodium: 142 mEq/L (ref 135–145)
Sodium: 138 mEq/L (ref 135–145)
TCO2: 17 mmol/L (ref 0–100)
TCO2: 17 mmol/L (ref 0–100)

## 2013-07-23 LAB — LIPASE, BLOOD: Lipase: 37 U/L (ref 11–59)

## 2013-07-23 LAB — URINALYSIS, ROUTINE W REFLEX MICROSCOPIC
Glucose, UA: NEGATIVE mg/dL
Leukocytes, UA: NEGATIVE
pH: 6 (ref 5.0–8.0)

## 2013-07-23 LAB — MAGNESIUM: Magnesium: 1.9 mg/dL (ref 1.5–2.5)

## 2013-07-23 LAB — RAPID URINE DRUG SCREEN, HOSP PERFORMED
Barbiturates: NOT DETECTED
Benzodiazepines: NOT DETECTED
Cocaine: NOT DETECTED

## 2013-07-23 LAB — ETHANOL: Alcohol, Ethyl (B): 376 mg/dL — ABNORMAL HIGH (ref 0–11)

## 2013-07-23 MED ORDER — LORAZEPAM 1 MG PO TABS
2.0000 mg | ORAL_TABLET | Freq: Once | ORAL | Status: AC
  Administered 2013-07-23: 2 mg via ORAL

## 2013-07-23 MED ORDER — LORAZEPAM 1 MG PO TABS: 2.0000 mg | ORAL_TABLET | Freq: Once | ORAL | Status: DC

## 2013-07-23 MED ORDER — QUETIAPINE FUMARATE 50 MG PO TABS
50.0000 mg | ORAL_TABLET | Freq: Two times a day (BID) | ORAL | Status: DC
  Administered 2013-07-23 – 2013-07-30 (×12): 50 mg via ORAL
  Filled 2013-07-23 (×15): qty 1

## 2013-07-23 MED ORDER — ONDANSETRON HCL 4 MG PO TABS: 4.0000 mg | ORAL_TABLET | Freq: Four times a day (QID) | ORAL | Status: DC | PRN

## 2013-07-23 MED ORDER — FOLIC ACID 1 MG PO TABS
1.0000 mg | ORAL_TABLET | Freq: Every day | ORAL | Status: DC
  Administered 2013-07-23 – 2013-07-26 (×4): 1 mg via ORAL
  Filled 2013-07-23 (×5): qty 1

## 2013-07-23 MED ORDER — ADULT MULTIVITAMIN W/MINERALS CH
1.0000 | ORAL_TABLET | Freq: Every day | ORAL | Status: DC
  Administered 2013-07-23 – 2013-07-26 (×4): 1 via ORAL
  Filled 2013-07-23 (×5): qty 1

## 2013-07-23 MED ORDER — SODIUM CHLORIDE 0.9 % IV SOLN
INTRAVENOUS | Status: DC
  Administered 2013-07-23 – 2013-07-27 (×5): via INTRAVENOUS
  Administered 2013-07-28: 19:00:00 1000 mL via INTRAVENOUS
  Administered 2013-07-28 – 2013-07-30 (×2): via INTRAVENOUS

## 2013-07-23 MED ORDER — SODIUM CHLORIDE 0.9 % IV BOLUS (SEPSIS)
1000.0000 mL | Freq: Once | INTRAVENOUS | Status: AC
  Administered 2013-07-23: 1000 mL via INTRAVENOUS

## 2013-07-23 MED ORDER — GABAPENTIN 400 MG PO CAPS
400.0000 mg | ORAL_CAPSULE | Freq: Three times a day (TID) | ORAL | Status: DC
  Administered 2013-07-23 – 2013-07-30 (×21): 400 mg via ORAL
  Filled 2013-07-23 (×23): qty 1

## 2013-07-23 MED ORDER — QUETIAPINE FUMARATE 200 MG PO TABS
200.0000 mg | ORAL_TABLET | Freq: Every day | ORAL | Status: DC
  Administered 2013-07-23 – 2013-07-29 (×7): 200 mg via ORAL
  Filled 2013-07-23 (×8): qty 1

## 2013-07-23 MED ORDER — METOPROLOL TARTRATE 25 MG PO TABS
25.0000 mg | ORAL_TABLET | Freq: Two times a day (BID) | ORAL | Status: DC
  Administered 2013-07-23 – 2013-07-26 (×7): 25 mg via ORAL
  Filled 2013-07-23 (×8): qty 1

## 2013-07-23 MED ORDER — SODIUM CHLORIDE 0.9 % IJ SOLN
3.0000 mL | Freq: Two times a day (BID) | INTRAMUSCULAR | Status: DC
  Administered 2013-07-25 – 2013-07-29 (×3): 3 mL via INTRAVENOUS

## 2013-07-23 MED ORDER — LORAZEPAM 2 MG/ML IJ SOLN
1.0000 mg | Freq: Four times a day (QID) | INTRAMUSCULAR | Status: AC | PRN
  Administered 2013-07-23 – 2013-07-25 (×2): 1 mg via INTRAVENOUS
  Filled 2013-07-23 (×2): qty 1

## 2013-07-23 MED ORDER — VITAMIN B-1 100 MG PO TABS
100.0000 mg | ORAL_TABLET | Freq: Every day | ORAL | Status: DC
  Administered 2013-07-23 – 2013-07-26 (×4): 100 mg via ORAL
  Filled 2013-07-23 (×5): qty 1

## 2013-07-23 MED ORDER — MORPHINE SULFATE 4 MG/ML IJ SOLN
4.0000 mg | INTRAMUSCULAR | Status: DC | PRN
  Administered 2013-07-24 – 2013-07-30 (×15): 4 mg via INTRAVENOUS
  Filled 2013-07-23 (×15): qty 1

## 2013-07-23 MED ORDER — ESCITALOPRAM OXALATE 20 MG PO TABS
20.0000 mg | ORAL_TABLET | Freq: Every day | ORAL | Status: DC
  Administered 2013-07-23 – 2013-07-30 (×8): 20 mg via ORAL
  Filled 2013-07-23 (×8): qty 1

## 2013-07-23 MED ORDER — PNEUMOCOCCAL VAC POLYVALENT 25 MCG/0.5ML IJ INJ
0.5000 mL | INJECTION | INTRAMUSCULAR | Status: AC
  Administered 2013-07-24: 09:00:00 0.5 mL via INTRAMUSCULAR
  Filled 2013-07-23 (×2): qty 0.5

## 2013-07-23 MED ORDER — DILTIAZEM HCL 30 MG PO TABS
30.0000 mg | ORAL_TABLET | Freq: Four times a day (QID) | ORAL | Status: DC
  Administered 2013-07-23 – 2013-07-24 (×3): 30 mg via ORAL
  Filled 2013-07-23 (×7): qty 1

## 2013-07-23 MED ORDER — THIAMINE HCL 100 MG/ML IJ SOLN
100.0000 mg | Freq: Every day | INTRAMUSCULAR | Status: DC
  Filled 2013-07-23 (×4): qty 1

## 2013-07-23 MED ORDER — ONDANSETRON HCL 4 MG/2ML IJ SOLN: 4.0000 mg | Freq: Four times a day (QID) | INTRAMUSCULAR | Status: DC | PRN

## 2013-07-23 MED ORDER — LORAZEPAM 1 MG PO TABS
1.0000 mg | ORAL_TABLET | Freq: Four times a day (QID) | ORAL | Status: AC | PRN
  Administered 2013-07-23 – 2013-07-25 (×2): 1 mg via ORAL
  Filled 2013-07-23 (×4): qty 1

## 2013-07-23 MED ORDER — PANTOPRAZOLE SODIUM 40 MG PO TBEC
40.0000 mg | DELAYED_RELEASE_TABLET | Freq: Two times a day (BID) | ORAL | Status: DC
  Administered 2013-07-23 – 2013-07-30 (×14): 40 mg via ORAL
  Filled 2013-07-23 (×17): qty 1

## 2013-07-23 MED ORDER — ENOXAPARIN SODIUM 40 MG/0.4ML ~~LOC~~ SOLN
40.0000 mg | SUBCUTANEOUS | Status: DC
  Administered 2013-07-23: 21:00:00 40 mg via SUBCUTANEOUS
  Filled 2013-07-23 (×2): qty 0.4

## 2013-07-23 MED ORDER — LORAZEPAM 1 MG PO TABS
0.0000 mg | ORAL_TABLET | Freq: Two times a day (BID) | ORAL | Status: AC
  Administered 2013-07-25 – 2013-07-26 (×2): 1 mg via ORAL
  Administered 2013-07-26: 2 mg via ORAL
  Administered 2013-07-27: 1 mg via ORAL
  Filled 2013-07-23 (×2): qty 1
  Filled 2013-07-23: qty 2

## 2013-07-23 MED ORDER — LORAZEPAM 1 MG PO TABS
0.0000 mg | ORAL_TABLET | Freq: Four times a day (QID) | ORAL | Status: AC
  Administered 2013-07-23 (×2): 2 mg via ORAL
  Administered 2013-07-23: 4 mg via ORAL
  Administered 2013-07-24 (×4): 1 mg via ORAL
  Filled 2013-07-23: qty 4
  Filled 2013-07-23 (×3): qty 2
  Filled 2013-07-23 (×2): qty 1

## 2013-07-23 NOTE — ED Notes (Addendum)
PA asked this RN to perform another CIWA and Pt would be reevaluated for discharge.  Pt was sleeping when this RN entered the room and only woke long enough for assessment.  HR remains between 125-135.

## 2013-07-23 NOTE — Progress Notes (Signed)
Dr. Arbutus Leas notified via text of CIWA 21 after The CIWA in ED @ 1400 was 16 as ordered per CIWA protocol.

## 2013-07-23 NOTE — ED Provider Notes (Signed)
CSN: [id number]     Arrival date & time 07/23/13  0112 History   First MD Initiated Contact with Patient 07/23/13 0134     Chief Complaint  Patient presents with  . Alcohol Intoxication   (Consider location/radiation/quality/duration/timing/severity/associated sxs/prior Treatment) HPI History provided by pt, EMS and prior chart.  Level 5 caveat applies because patient is intoxicated.  Per EMS report, pt was found wandering around a hotel parking lot w/out pants on. [employer] were on the scene.  Pt admits to drinking 100 proof vodka and everclear last night.  Denies abusing any drugs.  Had a fall on stairs at some time and hit his chest on a step.  Complains of pain in chest.  Denies SOB.  Did not hit his head and denies headache.  States that he needs pain medication as well as ativan because he is detoxing.  He also reports that he can not urinate, though he can not tell me why or how long this has been going on.  States that he had an in and out cath at most recent ED visit.  Per prior chart, pt seen in ED frequently for alcohol intoxication. Past Medical History  Diagnosis Date  . ETOH abuse   . Psoriasis   . Hypertension    Past Surgical History  Procedure Laterality Date  . Left 4th finger surgery    . Esophagogastroduodenoscopy N/A 05/03/2013    Procedure: ESOPHAGOGASTRODUODENOSCOPY (EGD);  Surgeon: Meryl Dare, MD;  Location: [facility];  Service: Endoscopy;  Laterality: N/A;   Family History  Problem Relation Age of Onset  . Stroke Father   . Diabetes type II Mother   . Cancer - Colon Other    History  Substance Use Topics  . Smoking status: Current Every Day Smoker -- 1.00 packs/day for 20 years    Types: Cigarettes  . Smokeless tobacco: Never Used  . Alcohol Use: Yes     Comment: drinks 1/5  per day    Review of Systems  All other systems reviewed and are negative.    Allergies  Review of patient's allergies indicates no known allergies.  Home Medications    Current Outpatient Rx  Name  Route  Sig  Dispense  Refill  . escitalopram (LEXAPRO) 20 MG tablet   Oral   Take 1 tablet (20 mg total) by mouth daily.   30 tablet   0   . gabapentin (NEURONTIN) 400 MG capsule   Oral   Take 1 capsule (400 mg total) by mouth 3 (three) times daily.   120 capsule   0   . metoprolol tartrate (LOPRESSOR) 25 MG tablet   Oral   Take 1 tablet (25 mg total) by mouth 2 (two) times daily.   60 tablet   0   . Multiple Vitamin (MULTIVITAMIN WITH MINERALS) TABS tablet   Oral   Take 1 tablet by mouth daily.           Nutritional supplement.   . QUEtiapine (SEROQUEL) 200 MG tablet   Oral   Take 1 tablet (200 mg total) by mouth at bedtime.   30 tablet   0   . QUEtiapine (SEROQUEL) 50 MG tablet   Oral   Take 1 tablet (50 mg total) by mouth 2 (two) times daily.   60 tablet   0    BP 150/89  Pulse 132  Temp(Src) 98.8 F (37.1 C) (Oral)  SpO2 95% Physical Exam  Nursing note and vitals reviewed. Constitutional:  He is oriented to person, place, and time. He appears well-developed and well-nourished. No distress.  HENT:  Head: Normocephalic and atraumatic.  Eyes:  Normal appearance  Neck: Normal range of motion.  Cardiovascular: Regular rhythm.   Slightly tachycardic  Pulmonary/Chest: Effort normal and breath sounds normal. No respiratory distress.  Musculoskeletal: Normal range of motion.  Neurological: He is alert and oriented to person, place, and time.  Hands tremulous  Skin: Skin is warm and dry. No rash noted.  Several psoriatic lesions   Psychiatric:  Pt is walking around the hall in his underwear.  Speech is slurred.      ED Course  Procedures (including critical care time) Labs Review Labs Reviewed  ETHANOL - Abnormal; Notable for the following:    Alcohol, Ethyl (B) 376 (*)    All other components within normal limits  CBC WITH DIFFERENTIAL - Abnormal; Notable for the following:    WBC 15.3 (*)    Neutrophils Relative %  85 (*)    Neutro Abs 13.0 (*)    Lymphocytes Relative 9 (*)    All other components within normal limits  URINALYSIS, ROUTINE W REFLEX MICROSCOPIC - Abnormal; Notable for the following:    Color, Urine AMBER (*)    APPearance CLOUDY (*)    Specific Gravity, Urine 1.033 (*)    Hgb urine dipstick LARGE (*)    Ketones, ur 40 (*)    Protein, ur >300 (*)    All other components within normal limits  URINE MICROSCOPIC-ADD ON - Abnormal; Notable for the following:    Casts GRANULAR CAST (*)    All other components within normal limits  POCT I-STAT, CHEM 8 - Abnormal; Notable for the following:    BUN 32 (*)    Creatinine, Ser 1.40 (*)    Glucose, Bld 131 (*)    Calcium, Ion 1.01 (*)    All other components within normal limits  URINE RAPID DRUG SCREEN (HOSP PERFORMED)   Imaging Review Dg Chest 2 View  07/23/2013   *RADIOLOGY REPORT*  Clinical Data: The patient fell down stairs.  Chest pain and labored breathing.  Intoxicated.  CHEST - 2 VIEW  Comparison: 07/15/2013  Findings: Shallow inspiration. The heart size and pulmonary vascularity are normal. The lungs appear clear and expanded without focal air space disease or consolidation. No blunting of the costophrenic angles.  No pneumothorax.  Mediastinal contours appear intact.  No significant changes since previous study.  IMPRESSION: No evidence of active pulmonary disease.   Original Report Authenticated By: Burman Nieves, M.D.    MDM   1. Alcohol intoxication   2. Dehydration   3. Proteinuria   4. Elevated serum creatinine     52yo M presents w/ alcohol intoxication.  Seen in ED frequently for same.  Complained of pain in chest s/p fall w/ chest impact on stair step.  No head impact.  Denies SOB.  CXR pending.  Also reports that he is unable to urinate and that he had an in and out cath at most recent ED visit.  He will not elaborate on urinary retention.  Bladder scanner shows nearly urine in bladder.  Will in and out.  He is  demanding ativan because he is detoxing.  He is tremulous and mildly tachy on exam.  Ativan/CIWA protocol ordered.  He is also demanding pain medication and I have declined.    Nursing staff placed a foley and patient yanked it out and threatened to leave AMA  if we don't give him any medications.  He is bleeding from his penis.  Nursing staff placed in adult diaper. Will re-examine shortly to see if the bleeding has resolved and then refer to urology at time of discharge.  I again explained why we will not be administering any pain medications and that leaving AMA is not an option unless he has a ride.    Pt to receive 4mg  ativan based on CIWA protocol.  Labs sig for hemoglobinuria, possibly traumatic from foley, proteinuria, ketonuria, elevated BUN/CR, likely pre-renal/dehydration, leukocytosis and alcohol level 376.  CXR non-acute.    Bleeding from penis has stopped.  Patient is receiving second liter NS for dehydration.  Will recheck creatinine when its completed.  Laisure to resume care.  Pt will receive referral to urology and nephrology as well as outpatient resources for detox.  5:52 AM    Otilio Miu, PA-C 07/23/13 [phone]

## 2013-07-23 NOTE — ED Notes (Signed)
Bed: WA20 Expected date:  Expected time:  Means of arrival:  Comments: 25 M, etoh

## 2013-07-23 NOTE — ED Notes (Signed)
Bladder scan done with 445 ml of residual / retention noted. PA notified, for Foley cath insertion.

## 2013-07-23 NOTE — ED Notes (Signed)
Per EMS , pt. ETOH  , was picked up in a hotel parking lot whom was reported to be wandering around without pants on. [employer] was at the scene.

## 2013-07-23 NOTE — Progress Notes (Signed)
CRITICAL VALUE ALERT  Critical value received: ck,mb 8.4  Date of notification:  [id number]  Time of notification:  1842  Critical value read back:yes  Nurse who received alert:  CCM  MD notified (1st page):  Dr. Arbutus Leas  Time of first page:  1842  MD notified (2nd page):  Time of second page:  Responding MD: Dr Arbutus Leas  Time MD responded:  [phone]

## 2013-07-23 NOTE — ED Notes (Signed)
Pt. pulled out foley cathter.

## 2013-07-23 NOTE — ED Notes (Signed)
Pt. in papers scrub, wanded by Security , with personal belongings; 1 shirt , 1 underwear and a silver watch kept in TCU locker #26.

## 2013-07-23 NOTE — Consult Note (Signed)
Reason for Consult: Abnormal EKG   Referring Physician: ER MD Dr. Micheline Maze  No primary provider on file. Primary Cardiologist:new  Jeffrey Black is an 52 y.o. male.    Chief Complaint:  Pt presented with Alcohol intoxication.   HPI: 54 YOM (per EMS) was found wandering around a hotel parking lot w/out pants on. [employer] were on the scene. Pt admits to drinking 100 proof vodka and everclear last night. Denies abusing any drugs. Had a fall on stairs at some time and hit his chest on a step. Complains of pain in chest. Denies SOB. Did not hit his head and denies headache. States that he needs pain medication as well as ativan because he is detoxing. He also reports that he can not urinate, though he can not tell me why or how long this has been going on. States that he had an in and out cath at most recent ED visit. Per prior chart, pt seen in ED frequently for alcohol intoxication. He did have urinary retention, foley placed but he has now pulled this out.  EKG initially without acute changes but then HR up and ST changes are present.  Troponin I is negative.  Please note family has refused to take him at discharge.  Previous Echo 02/2013: Left ventricle: The cavity size was normal. Systolic function was normal. The estimated ejection fraction was in the range of 55% to 60%. Wall motion was normal; there were no regional wall motion abnormalities. - Atrial septum: No defect or patent foramen ovale was identified. - Pulmonary arteries: Systolic pressure was mildly increased. PA peak pressure: 33mm Hg (S).  He statee he has on occ. Had chest pain other than from today after hitting his chest on the step.  Last Christmas he thought he had a "heart attack".  He also stated his heart races at times and the lopressor had make it better.  No SOB lying flat now.   Past Medical History  Diagnosis Date  . ETOH abuse   . Psoriasis   . Hypertension     Past Surgical History  Procedure  Laterality Date  . Left 4th finger surgery    . Esophagogastroduodenoscopy N/A 05/03/2013    Procedure: ESOPHAGOGASTRODUODENOSCOPY (EGD);  Surgeon: Meryl Dare, MD;  Location: [facility];  Service: Endoscopy;  Laterality: N/A;    Family History  Problem Relation Age of Onset  . Stroke Father   . Diabetes type II Mother   . Cancer - Colon Other    Social History:  reports that he has been smoking Cigarettes.  He has a 20 pack-year smoking history. He has never used smokeless tobacco. He reports that  drinks alcohol. He reports that he does not use illicit drugs.  Allergies: No Known Allergies  OUTPATIENT MEDS: Lexapro 20 mg daily Neurontin 400 mg TID Lopressor 25 mg BID--he thinks he has been taking MVI Seroquel 200 mg at HS, and 50 mg BID     Results for orders placed during the hospital encounter of 07/23/13 (from the past 48 hour(s))  ETHANOL     Status: Abnormal   Collection Time    07/23/13  1:36 AM      Result Value Range   Alcohol, Ethyl (B) 376 (*) 0 - 11 mg/dL   Comment:            LOWEST DETECTABLE LIMIT FOR     SERUM ALCOHOL IS 11 mg/dL     FOR MEDICAL PURPOSES ONLY  CBC WITH DIFFERENTIAL     Status: Abnormal   Collection Time    07/23/13  1:36 AM      Result Value Range   WBC 15.3 (*) 4.0 - 10.5 K/uL   RBC 4.61  4.22 - 5.81 MIL/uL   Hemoglobin 14.5  13.0 - 17.0 g/dL   HCT 21.3  08.6 - 57.8 %   MCV 92.2  78.0 - 100.0 fL   MCH 31.5  26.0 - 34.0 pg   MCHC 34.1  30.0 - 36.0 g/dL   RDW 46.9  62.9 - 52.8 %   Platelets 211  150 - 400 K/uL   Neutrophils Relative % 85 (*) 43 - 77 %   Neutro Abs 13.0 (*) 1.7 - 7.7 K/uL   Lymphocytes Relative 9 (*) 12 - 46 %   Lymphs Abs 1.3  0.7 - 4.0 K/uL   Monocytes Relative 7  3 - 12 %   Monocytes Absolute 1.0  0.1 - 1.0 K/uL   Eosinophils Relative 0  0 - 5 %   Eosinophils Absolute 0.0  0.0 - 0.7 K/uL   Basophils Relative 0  0 - 1 %   Basophils Absolute 0.0  0.0 - 0.1 K/uL  POCT I-STAT, CHEM 8     Status: Abnormal    Collection Time    07/23/13  1:43 AM      Result Value Range   Sodium 142  135 - 145 mEq/L   Potassium 4.0  3.5 - 5.1 mEq/L   Chloride 108  96 - 112 mEq/L   BUN 32 (*) 6 - 23 mg/dL   Creatinine, Ser 4.13 (*) 0.50 - 1.35 mg/dL   Glucose, Bld 244 (*) 70 - 99 mg/dL   Calcium, Ion 0.10 (*) 1.12 - 1.23 mmol/L   TCO2 17  0 - 100 mmol/L   Hemoglobin 15.6  13.0 - 17.0 g/dL   HCT 27.2  53.6 - 64.4 %  URINE RAPID DRUG SCREEN (HOSP PERFORMED)     Status: None   Collection Time    07/23/13  2:56 AM      Result Value Range   Opiates NONE DETECTED  NONE DETECTED   Cocaine NONE DETECTED  NONE DETECTED   Benzodiazepines NONE DETECTED  NONE DETECTED   Amphetamines NONE DETECTED  NONE DETECTED   Tetrahydrocannabinol NONE DETECTED  NONE DETECTED   Barbiturates NONE DETECTED  NONE DETECTED   Comment:            DRUG SCREEN FOR MEDICAL PURPOSES     ONLY.  IF CONFIRMATION IS NEEDED     FOR ANY PURPOSE, NOTIFY LAB     WITHIN 5 DAYS.                LOWEST DETECTABLE LIMITS     FOR URINE DRUG SCREEN     Drug Class       Cutoff (ng/mL)     Amphetamine      1000     Barbiturate      200     Benzodiazepine   200     Tricyclics       300     Opiates          300     Cocaine          300     THC              50  URINALYSIS, ROUTINE W REFLEX MICROSCOPIC  Status: Abnormal   Collection Time    07/23/13  2:56 AM      Result Value Range   Color, Urine AMBER (*) YELLOW   Comment: BIOCHEMICALS MAY BE AFFECTED BY COLOR   APPearance CLOUDY (*) CLEAR   Specific Gravity, Urine 1.033 (*) 1.005 - 1.030   pH 6.0  5.0 - 8.0   Glucose, UA NEGATIVE  NEGATIVE mg/dL   Hgb urine dipstick LARGE (*) NEGATIVE   Bilirubin Urine NEGATIVE  NEGATIVE   Ketones, ur 40 (*) NEGATIVE mg/dL   Protein, ur >161 (*) NEGATIVE mg/dL   Urobilinogen, UA 0.2  0.0 - 1.0 mg/dL   Nitrite NEGATIVE  NEGATIVE   Leukocytes, UA NEGATIVE  NEGATIVE  URINE MICROSCOPIC-ADD ON     Status: Abnormal   Collection Time    07/23/13  2:56 AM       Result Value Range   Squamous Epithelial / LPF RARE  RARE   WBC, UA 0-2  <3 WBC/hpf   Casts GRANULAR CAST (*) NEGATIVE  POCT I-STAT, CHEM 8     Status: Abnormal   Collection Time    07/23/13  7:08 AM      Result Value Range   Sodium 138  135 - 145 mEq/L   Potassium 4.4  3.5 - 5.1 mEq/L   Chloride 106  96 - 112 mEq/L   BUN 26 (*) 6 - 23 mg/dL   Creatinine, Ser 0.96  0.50 - 1.35 mg/dL   Glucose, Bld 045 (*) 70 - 99 mg/dL   Calcium, Ion 4.09 (*) 1.12 - 1.23 mmol/L   TCO2 17  0 - 100 mmol/L   Hemoglobin 12.2 (*) 13.0 - 17.0 g/dL   HCT 81.1 (*) 91.4 - 78.2 %  MAGNESIUM     Status: None   Collection Time    07/23/13 11:55 AM      Result Value Range   Magnesium 1.9  1.5 - 2.5 mg/dL  TROPONIN I     Status: None   Collection Time    07/23/13 11:55 AM      Result Value Range   Troponin I <0.30  <0.30 ng/mL   Comment:            Due to the release kinetics of cTnI,     a negative result within the first hours     of the onset of symptoms does not rule out     myocardial infarction with certainty.     If myocardial infarction is still suspected,     repeat the test at appropriate intervals.   Dg Chest 2 View  07/23/2013   *RADIOLOGY REPORT*  Clinical Data: The patient fell down stairs.  Chest pain and labored breathing.  Intoxicated.  CHEST - 2 VIEW  Comparison: 07/15/2013  Findings: Shallow inspiration. The heart size and pulmonary vascularity are normal. The lungs appear clear and expanded without focal air space disease or consolidation. No blunting of the costophrenic angles.  No pneumothorax.  Mediastinal contours appear intact.  No significant changes since previous study.  IMPRESSION: No evidence of active pulmonary disease.   Original Report Authenticated By: Burman Nieves, M.D.    ROS: General:no colds or fevers, no weight changes Skin:+ psoriasis  no ulcers HEENT:no blurred vision, no congestion CV:see HPI PUL:see HPI GI:no diarrhea constipation or melena, no  indigestion GU:no hematuria, no dysuria, recent urinary retention then he just pulled foley out and now with hematuria  MS:no joint pain, no claudication Neuro:no syncope,  no lightheadedness Endo:no diabetes, no thyroid disease   Blood pressure 145/89, pulse 120, temperature 98.4 F (36.9 C), temperature source Oral, resp. rate 20, SpO2 100.00%. PE: General:Pleasant affect, NAD, when he closes his eyes he sees things today Skin:Warm and dry, brisk capillary refill, porosis of extreme ties  HEENT:normocephalic, sclera clear, mucus membranes moist Neck:supple, no JVD, no bruits  Heart:S1S2 RRR premature beats, without murmur, gallup, rub or click Lungs:clear without rales, rhonchi, or wheezes WUJ:WJXB, non tender, + BS, do not palpate liver spleen or masses Ext:no lower ext edema, 2+ pedal pulses, 2+ radial pulses, tremors of ext. Neuro:alert and oriented, MAE, follows commands, + facial symmetry    Assessment/Plan Active Problems:   Abnormal EKG   Tachyarrhythmia Acute EtOH Intoxication.  PLAN: add cardizem, stop BB once cardizem in system. Echo, check troponin.    [facility] R  Nurse Practitioner Certified [facility] Pager [phone] 07/23/2013, 1:49 PM  Very unfortunate 52 year old alcoholic with an impressive drinking history, has had an actually normal echocardiogram back in April of this year.  We're Stuart the patient as he presented this time again acutely intoxicated being found walking in a parking lot without pants on.  He was profoundly dehydrated upon arrival, but unlike previous ER visits, his potassium magnesium waxy relatively normal.  He has been placed on beta blockers in the past for tachycardia, but I don't have any details of what tachycardia was present sinus tachycardia.  Unfortunately it appears that he have "overdosed on beta blockers. "  We were asked to consult because of a brief burst of wide complex tachycardia that has features that  are consistent with SVT with aberrancy, but ventricle tachycardia cannot be excluded.  He does have ectopic beats which are conducted in the same manner as seen in this rhythm.  Each QRS complex has a peeling associated with it.  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  His baseline ECG today he is significantly different than previous ECGs with biphasic ST- T waves in the precordial leads that aren't new.  The only discomfort in his chest he notes is because he fell in his chest.  However he did note an episode back in January of resting chest pain, and does note intermittent dyspnea on exertion.  Of note: he is not the greatest of historians in his current condition.  As I don't think he would be clinically stable for discharge from intoxication standpoint with his tremors and high risk for DTs.  It is prudent to admit him at least for observation status to monitor on telemetry overnight the patient has no further arrhythmias.  Intravenous potassium magnesium are therapeutic --greater than 4 for potassium and 2 for magnesium.  Recommendations: Admit for overnight observation.  2-D echocardiogram in the morning to insure no change in his cardiac function given his high risk of alcohol mediated cardiomyopathy. Given the high likelihood of rebound tachycardia with beta blockers, even though they would be better for SVT or PVCs, I think thousand, there may be a better option in this gentleman who is likely to not take medications.  Sinus tachycardia is now could be rebound tachycardia.  Would evaluate for cardiac biomarkers to space on the abnormal ECG.  He may need an outpatient stress test, provided his echocardiogram has not changed.  Otherwise the plan will have to be adjusted based on his echocardiogram shows.  I would like him to have an outpatient monitor  placed our office next week just to evaluate the frequency of these arrhythmias.  Unfortunately  his compliance is likely to be suspect.  Will follow along and monitor for the echocardiogram tomorrow.  As the echocardiogram is a major factor in his evaluation, he will be seen until echocardiogram is ready to be read.  Marykay Lex, M.D., M.S. [facility] [address] [place], [place]  [address]  [phone] Pager # [phone] 07/23/2013 2:41 PM

## 2013-07-23 NOTE — Progress Notes (Signed)
Dr. Arbutus Leas requests that [facility] be notified of elevated ck,mb of 8.4

## 2013-07-23 NOTE — Progress Notes (Signed)
Ck,mb 8.4 reported to [facility], no new orders

## 2013-07-23 NOTE — ED Provider Notes (Signed)
Medical screening examination/treatment/procedure(s) were performed by non-physician practitioner and as supervising physician I was immediately available for consultation/collaboration.   Megan E Docherty, MD 07/23/13 0717 

## 2013-07-23 NOTE — ED Notes (Signed)
Pt's family called and informed this RN that they would not be coming to take him home upon discharge.  Sts "we've been doing this for too long and we're done."

## 2013-07-23 NOTE — ED Provider Notes (Signed)
6:15 AM Patient signed out to me by Kyung Bacca, PA-C at shift change.  Patient presents today intoxicated.  His creatine was found to be 1.4.  This was thought to be secondary to dehydration.  Plan is for the i-stat 8 to be repeated to see if the Creatine had improved after IVF.  Plan is also to repeat CIWA.   7:20 AM Repeat Creatine 1.0.   10:09 AM Patient continues to be tachycardic with a heart rate of 125 after getting IVF and Ativan per CIWA protocol.  Reassessed patient.  He reports that he is feeling better.  Patient alert and orientated.  He denies AH or VH.  No tremor at this time.  Will get EKG.  Will also order another one liter IVF.   Date: 07/23/2013  Rate: 119  Rhythm: sinus tachycardia  QRS Axis: normal  Intervals: normal  ST/T Wave abnormalities: nonspecific T wave changes  Conduction Disutrbances:none  Narrative Interpretation: T wave inversions in leads V3-V6  Old EKG Reviewed: changes noted, new t wave inversions  Patient denies chest pain at this time.     11:49 AM Patient found to have a tachy arrhythmia on his rhythm strip.  Rhythm strip evaluated by Dr. Rubin Payor.  Cardiology consulted regarding the arrythmia.  Magnesium ordered.  1:33 PM Discussed with Cardiology.  They report that they will come see the patient in the ED and recommend having the patient admitted for Observation.  Magnesium WNL.   Discussed with Dr. Arbutus Leas with [facility] who has agreed to admit the patient.    Pascal Lux Chester, PA-C 07/23/13 1544

## 2013-07-23 NOTE — H&P (Signed)
[facility] History and Physical  Jeffrey Black ZOX:[id number] DOB: 05/22/1961 DOA: 07/23/2013   PCP: No primary provider on file.   Chief Complaint: Chest pain, alcohol intoxication  HPI:  52 year old male with a history of recurrent alcohol abuse and intoxication as well as psoriasis presents after the patient was found wandering around without his pants on.  The patient was intoxicated, and [facility] was called and the patient was brought to the emergency department. Apparently the patient had passed out and fallen on the stairs and hit his chest on some steps. The patient states that he had been drinking 2x half a gallon of 100 proof vodka and Everclear the night before admission. The patient states that he has been drinking three quarters of a gallon of EverClear alcohol on a daily basis. The patient has been staying at [facility]. The patient currently is awake and alert. He denies any other illegal drug use. He complains of chest discomfort that is sharp and stabbing ever since he fell.  He believes that this is likely related to his alcohol use. He denies any fevers, chills, hematemesis, hematochezia, melena, dysuria, hematuria. He does have some epigastric abdominal pain. The patient was recently discharged from [facility] on 04/20/2013 after a recent stay for suicidal ideation. In fact, the patient has been multiple visits to [facility] for alcohol detox.  He has had numerous admissions for alcohol intoxication.  In the ED, the patient was noted to be tachycardic initially into 130s. On telemetry, the patient was noted to have a short run of tachycardia that appeared to be wide-complex.  [facility] was called and have seen the patient.  They wanted the patient to be admitted in light of the dysrhythmia and intoxication. The patient had been previously prescribed metoprolol tartrate, but he does not take the medications on regular basis. In ED, the patient was given 3 L normal saline. His heart rate  did improve in to 110s. He remained hemodynamically stable. The patient was placed on CIWA protocol. WBC count is 15.3. BMP showed bicarbonate of 17 with initial serum creatinine 1.40. Magnesium 1.9. Urine drug screen was negative. Urinalysis was negative for pyuria. EKG shows sinus rhythm with T-wave inversion V3 to V6.  Assessment/Plan: Alcohol intoxication with early signs of withdrawal  -Continue alcohol withdrawal protocol  -Patient has required Precedex in past due to DTs  -Continue thiamine, folic acid, multivitamin  -Urine drug screen negative Tachycardia  -Has been a chronic problem in the past -Has been partly attributed to volume depletion -Patient had short run of wide-complex tachycardia on telemetry -Patient likely has rebound tachycardia as well due to his noncompliance with metoprolol tartrate For which he was prescribed in the past for nonspecific tachycardia -Continue IV fluids  -04/15/13 TSH 1.202  -Repeat TSH  -02/28/2039 echocardiogram shows EF 55-60%,  No WMA -Cardiology he switched the patient to diltiazem Epigastric pain -Check lipase  -Likely due to alcoholic gastritis  -Continue PPI  -Continue IV fluids  -05/03/2013 EGD shows suspected Barrett's esophagus, Grade B esophagitis, erosive gastritis AKI -Serum creatinine 1.40 the time of admission -Serum creatinine improved to 1.00 after 3 L of fluid Tobacco use  -Smokes 1-1/2 packs per day x30 years  -Tobacco cessation discussed  -Nicoderm patch  Chest pain  -Likely due to trauma from falling  -Morphine when necessary  - ACS unlikely but will cycle troponin  -EKG  Metabolic acidosis  -Likely due to alcoholic ketosis  -Check lactic acid  -Continue IV fluids  -  Monitor BMP Bipolar disorder -Continue Seroquel previously prescribed by psychiatry       Past Medical History  Diagnosis Date  . ETOH abuse   . Psoriasis   . Hypertension    Past Surgical History  Procedure Laterality Date  . Left 4th  finger surgery    . Esophagogastroduodenoscopy N/A 05/03/2013    Procedure: ESOPHAGOGASTRODUODENOSCOPY (EGD);  Surgeon: Meryl Dare, MD;  Location: [facility];  Service: Endoscopy;  Laterality: N/A;   Social History:  reports that he has been smoking Cigarettes.  He has a 20 pack-year smoking history. He has never used smokeless tobacco. He reports that  drinks alcohol. He reports that he does not use illicit drugs.   Family History  Problem Relation Age of Onset  . Stroke Father   . Diabetes type II Mother   . Cancer - Colon Other      No Known Allergies    Prior to Admission medications   Medication Sig Start Date End Date Taking? Authorizing Provider  escitalopram (LEXAPRO) 20 MG tablet Take 1 tablet (20 mg total) by mouth daily. 06/29/13   Verne Spurr, PA-C  gabapentin (NEURONTIN) 400 MG capsule Take 1 capsule (400 mg total) by mouth 3 (three) times daily. 06/29/13   Verne Spurr, PA-C  metoprolol tartrate (LOPRESSOR) 25 MG tablet Take 1 tablet (25 mg total) by mouth 2 (two) times daily. 06/29/13   Verne Spurr, PA-C  Multiple Vitamin (MULTIVITAMIN WITH MINERALS) TABS tablet Take 1 tablet by mouth daily. 06/29/13   Verne Spurr, PA-C  QUEtiapine (SEROQUEL) 200 MG tablet Take 1 tablet (200 mg total) by mouth at bedtime. 06/29/13   Verne Spurr, PA-C  QUEtiapine (SEROQUEL) 50 MG tablet Take 1 tablet (50 mg total) by mouth 2 (two) times daily. 06/29/13   Verne Spurr, PA-C    Review of Systems:  Constitutional:  No weight loss, night sweats, Fevers, chills, fatigue.  Head&Eyes: No headache.  No vision loss.  No eye pain or scotoma ENT:  No Difficulty swallowing,Tooth/dental problems,Sore throat,   Cardio-vascular:  NoOrthopnea, PND, swelling in lower extremities,  dizziness, palpitations  GI:  No  abdominal pain, nausea, vomiting, diarrhea, loss of appetite, hematochezia, melena, heartburn, indigestion, Resp:  No shortness of breath with exertion or at rest. No cough.  No coughing up of blood .No wheezing.No chest wall deformity  Skin:  Chronic psoriasis plaques GU:  no dysuria, change in color of urine, no urgency or frequency. No flank pain.  Musculoskeletal:  No joint pain or swelling. No decreased range of motion. No back pain.  Psych:  No change in mood or affect.  Neurologic: No headache, no dysesthesia, no focal weakness, no vision loss.   Physical Exam: Filed Vitals:   07/23/13 1330 07/23/13 1343 07/23/13 1400 07/23/13 1430  BP: 145/89 145/89 140/80 154/79  Pulse: 113 120 116 125  Temp:    98.6 F (37 C)  TempSrc:      Resp: 20  25   Height:    6' (1.829 m)  Weight:    90.719 kg (200 lb)  SpO2: 100%  98% 100%   General:  A&O x 2, NAD, nontoxic, pleasant/cooperative Head/Eye: No conjunctival hemorrhage, no icterus, [place]/AT, No nystagmus ENT:  No icterus,  No thrush,, no pharyngeal exudate Neck:  No masses, no lymphadenpathy,  CV:  RRR, no rub, no gallop, no S3 Lung:  CTAB, good air movement, no wheeze, no rhonchi Abdomen: soft/NT, +BS, nondistended, no peritoneal signs Ext: No cyanosis, No  rashes, No petechiae, No lymphangitis, No edema Neuro: CNII-XII intact, strength 4/5 in bilateral upper and lower extremities, no dysmetria  Labs on Admission:  Basic Metabolic Panel:  Recent Labs Lab 07/23/13 0143 07/23/13 0708 07/23/13 1155  NA 142 138  --   K 4.0 4.4  --   CL 108 106  --   GLUCOSE 131* 124*  --   BUN 32* 26*  --   CREATININE 1.40* 1.00  --   MG  --   --  1.9   Liver Function Tests: No results found for this basename: AST, ALT, ALKPHOS, BILITOT, PROT, ALBUMIN,  in the last 168 hours No results found for this basename: LIPASE, AMYLASE,  in the last 168 hours No results found for this basename: AMMONIA,  in the last 168 hours CBC:  Recent Labs Lab 07/23/13 0136 07/23/13 0143 07/23/13 0708  WBC 15.3*  --   --   NEUTROABS 13.0*  --   --   HGB 14.5 15.6 12.2*  HCT 42.5 46.0 36.0*  MCV 92.2  --   --   PLT 211   --   --    Cardiac Enzymes:  Recent Labs Lab 07/23/13 1155  TROPONINI <0.30   BNP: No components found with this basename: POCBNP,  CBG: No results found for this basename: GLUCAP,  in the last 168 hours  Radiological Exams on Admission: Dg Chest 2 View  07/23/2013   *RADIOLOGY REPORT*  Clinical Data: The patient fell down stairs.  Chest pain and labored breathing.  Intoxicated.  CHEST - 2 VIEW  Comparison: 07/15/2013  Findings: Shallow inspiration. The heart size and pulmonary vascularity are normal. The lungs appear clear and expanded without focal air space disease or consolidation. No blunting of the costophrenic angles.  No pneumothorax.  Mediastinal contours appear intact.  No significant changes since previous study.  IMPRESSION: No evidence of active pulmonary disease.   Original Report Authenticated By: Burman Nieves, M.D.    EKG: Independently reviewed. Sinus tach, no ST-T changes    Time spent:60 minutes Code Status:   FULL Family Communication:   No Family at bedside   Masayuki Sakai, DO  [facility] Pager [phone]  If 7PM-7AM, please contact night-coverage [email or web address] Password [email or web address] 07/23/2013, 2:55 PM

## 2013-07-24 DIAGNOSIS — F1010 Alcohol abuse, uncomplicated: Secondary | ICD-10-CM

## 2013-07-24 DIAGNOSIS — F1020 Alcohol dependence, uncomplicated: Secondary | ICD-10-CM

## 2013-07-24 DIAGNOSIS — R748 Abnormal levels of other serum enzymes: Secondary | ICD-10-CM

## 2013-07-24 DIAGNOSIS — F10239 Alcohol dependence with withdrawal, unspecified: Secondary | ICD-10-CM

## 2013-07-24 LAB — TROPONIN I
Troponin I: <0.3 ng/mL (ref <0.30)
Troponin I: <0.3 ng/mL (ref <0.30)

## 2013-07-24 LAB — COMPREHENSIVE METABOLIC PANEL
AST: 53 U/L — ABNORMAL HIGH (ref 0–37)
BUN: 14 mg/dL (ref 6–23)
CO2: 24 mEq/L (ref 19–32)
Calcium: 8.5 mg/dL (ref 8.4–10.5)
Creatinine, Ser: 0.63 mg/dL (ref 0.50–1.35)
GFR calc non Af Amer: >90 mL/min (ref >90)
Total Bilirubin: 0.9 mg/dL (ref 0.3–1.2)

## 2013-07-24 LAB — CBC
Hemoglobin: 10.7 g/dL — ABNORMAL LOW (ref 13.0–17.0)
MCH: 31.6 pg (ref 26.0–34.0)
Platelets: 116 10*3/uL — ABNORMAL LOW (ref 150–400)
RBC: 3.39 MIL/uL — ABNORMAL LOW (ref 4.22–5.81)
WBC: 7.3 10*3/uL (ref 4.0–10.5)

## 2013-07-24 LAB — RETICULOCYTES: Retic Count, Absolute: 20.8 10*3/uL (ref 19.0–186.0)

## 2013-07-24 LAB — MAGNESIUM: Magnesium: 2.1 mg/dL (ref 1.5–2.5)

## 2013-07-24 NOTE — Progress Notes (Signed)
  Echocardiogram 2D Echocardiogram has been performed.  Arvil Chaco 07/24/2013, 11:40 AM

## 2013-07-24 NOTE — Progress Notes (Signed)
Pt prompted to void and was able to void 275 ml. Jeffrey Black A

## 2013-07-24 NOTE — Progress Notes (Signed)
   CARE MANAGEMENT NOTE 07/24/2013  Patient:  Jeffrey Black, Jeffrey Black   Account Number:  [id number]  Date Initiated:  07/24/2013  Documentation initiated by:  [facility]  Subjective/Objective Assessment:   ETOH abuse, falls     Action/Plan:   lives at [facility], multiple admission in recent months   Anticipated DC Date:  07/25/2013   Anticipated DC Plan:  HOME/SELF CARE  In-house referral  Clinical Social Worker      DC Planning Services  CM consult  Medication Assistance      Choice offered to / List presented to:             Status of service:  In process, will continue to follow Medicare Important Message given?   (If response is "NO", the following Medicare IM given date fields will be blank) Date Medicare IM given:   Date Additional Medicare IM given:    Discharge Disposition:    Per UR Regulation:    If discussed at Long Length of Stay Meetings, dates discussed:    Comments:  07/24/2013 1300 NCM spoke to pt and states his insurance will end on Wednesday. States he gets his meds from [place]. States he has not been to [employer]. His last job was in January. Pt was lethargic from medications to continue NCM consult. NCM will continue to follow up on meds assistance. Will do a benefit check on insurance and medication coverage. Will have weekday NCM follow up with [facility] on meds and scheduling an appt. Glyn Ade RN CCM Case Mgmt phone [phone]  07/24/2013 1111 Referral to CSW for follow up on living situation and assistance with drug rehab programs. Isidoro Donning RN CCM Case Mgmt phone [phone]

## 2013-07-24 NOTE — Progress Notes (Signed)
   CARE MANAGEMENT NOTE 07/24/2013  Patient:  Jeffrey Black, Jeffrey Black   Account Number:  [id number]  Date Initiated:  07/24/2013  Documentation initiated by:  [facility]  Subjective/Objective Assessment:   ETOH abuse, falls     Action/Plan:   lives at [facility], multiple admission in recent months   Anticipated DC Date:  07/25/2013   Anticipated DC Plan:  HOME/SELF CARE  In-house referral  Clinical Social Worker      DC Planning Services  CM consult  Medication Assistance      Choice offered to / List presented to:             Status of service:  In process, will continue to follow Medicare Important Message given?   (If response is "NO", the following Medicare IM given date fields will be blank) Date Medicare IM given:   Date Additional Medicare IM given:    Discharge Disposition:    Per UR Regulation:    If discussed at Long Length of Stay Meetings, dates discussed:    Comments:  07/24/2013 1111 Referral to CSW for follow up on living situation and assistance with drug rehab programs. Isidoro Donning RN CCM Case Mgmt phone [phone]

## 2013-07-24 NOTE — Progress Notes (Signed)
3p pt still unable to void. Pt assisted to BR and was able to urinate small amount that was could not be measured. Bladder scanner performed showing . MD informed. Will continue to monitor. Terran Hollenkamp A

## 2013-07-24 NOTE — Progress Notes (Signed)
Clinical Social Work Department BRIEF PSYCHOSOCIAL ASSESSMENT 07/24/2013  Patient:  Jeffrey Black, Jeffrey Black     Account Number:  [id number]     Admit date:  07/23/2013  Clinical Social Worker:  Doroteo Glassman  Date/Time:  07/24/2013 03:38 PM  Referred by:  Physician  Date Referred:  07/24/2013 Referred for  Homelessness   Other Referral:   Interview type:  Patient Other interview type:    PSYCHOSOCIAL DATA Living Status:  ALONE Admitted from facility:   Level of care:   Primary support name:  [employer] Primary support relationship to patient:  [place] Degree of support available:   poor    CURRENT CONCERNS Current Concerns  Other - See comment   Other Concerns:   Homelessness, SA    SOCIAL WORK ASSESSMENT / PLAN Met with Pt to discuss current SA and housing needs.    Pt reported that he has been staying in an Extended Stay for a period of time but that his money is running out and that he anticipates only being able to stay there for approx 2 more weeks.  After that, Pt stated that he intends to go to a shelter while he finds gainful employment through a temp agency.  He stated that he used to work as a Furniture conservator/restorer for a [employer] and he feels that his skills will ensure that he finds some type of employment.    Pt stated that going to his sister's home is not an options, as Pt stated, "She's probably through with me." He explained that his sister has done a lot in the past to help with his recovery from alcohol and he hasn't been able to abstain.    Pt expressed his interested in [facility] and [employer]    CSW provided Pt with information on [facility] and discussed their admission procedures (an initial appt must be made for an assessment before admission).  CSW also provided Pt with information on area [facility], per Pt request. CSW discussed with Pt, though, that in order to get into an [facility], he must be coming from a rehab facility.  Pt stated that  he understood this, as he was in an [employer] by hx.  CSW also gave Pt information on [employer].    Pt did not want CSW's assistance, at this time, with obtaining SA tx but stated that he'd notify his RN if he changes his mind.    CSW thanked Pt for his time.   Assessment/plan status:  No Further Intervention Required Other assessment/ plan:   Information/referral to community resources:   Area [employer]  [employer]    PATIENT'S/FAMILY'S RESPONSE TO PLAN OF CARE: Pt is beginning to understand that he will have to make some changes in his lifestyle.  While he's not ready for tx, at this time, he understands that in order to improve himself, tx will play a factor.    Pt thanked CSW for time and assistance.   Providence Crosby, LCSWA Clinical Social Work [phone]

## 2013-07-24 NOTE — Progress Notes (Signed)
Subjective:  Feels much better today.  Chest hurts from his fall.  Objective:  Vital Signs in the last 24 hours: Temp:  [97.9 F (36.6 C)-98.4 F (36.9 C)] 97.9 F (36.6 C) (09/07 1408) Pulse Rate:  [75-96] 75 (09/07 1408) Resp:  [16-20] 16 (09/07 1408) BP: (104-141)/(64-89) 127/83 mmHg (09/07 1408) SpO2:  [96 %-98 %] 98 % (09/07 1408)  Intake/Output from previous day: 09/06 0701 - 09/07 0700 In: 1258.8 [P.O.:240; I.V.:1018.8] Out: -  Intake/Output from this shift: Total I/O In: 1200 [P.O.:600; I.V.:600] Out: 925 [Urine:925]  Physical Exam: General appearance: alert, cooperative and no distress Neck: no adenopathy, no carotid bruit and no JVD Lungs: clear to auscultation bilaterally, normal percussion bilaterally and non-labored Heart: regular rate and rhythm, S1, S2 normal, no murmur, click, rub or gallop, normal apical impulse and no notable ectopy Abdomen: soft, non-tender; bowel sounds normal; no masses,  no organomegaly Extremities: extremities normal, atraumatic, no cyanosis or edema Pulses: 2+ and symmetric  Lab Results:  Recent Labs  07/23/13 0136  07/23/13 0708 07/24/13 0555  WBC 15.3*  --   --  7.3  HGB 14.5  < > 12.2* 10.7*  PLT 211  --   --  116*  < > = values in this interval not displayed.  Recent Labs  07/23/13 0708 07/24/13 0555  NA 138 142  K 4.4 3.5  CL 106 108  CO2  --  24  GLUCOSE 124* 82  BUN 26* 14  CREATININE 1.00 0.63    Recent Labs  07/23/13 2342 07/24/13 0555  TROPONINI <0.30 <0.30   Hepatic Function Panel  Recent Labs  07/24/13 0555  PROT 5.4*  ALBUMIN 2.8*  AST 53*  ALT 31  ALKPHOS 61  BILITOT 0.9   Cardiac Studies:  Echo - finally ready to be read this PM ~4:30. - hyperdynamic with near cavity obliteratino  Assessment/Plan:  Active Problems:   Alcohol intoxication in active alcoholic, acute   Abnormal EKG   Tachyarrhythmia  No further tachyarrhythmia - only occ PACs.  Overall HR has improved. My  inclination is that he may well have had PVCs & even short burst of VT (vs. SVT) due to LV cavity obliteration due to EtOH related dehydration.  Echo was essentially normal.  No further events on Tele.    At this point, I would simply continue BB & counsel the patient re: continued intoxication / dehydration etc.  He needs to continue BB! To avoid withdrawal tachycardia/HTN.  Continue Ativan PRN to avoid DTs & anxiety related tachycardia.  From a cardiology standpoint, I think he can likely be d/c'd.  Would need to know that he has a stable place to go on d/c prior to arranging OP monitor.  Will consider OP ST if further episodes occur, but with normal EF & no angina, low index of suspicion.  Would probably keep things simplified & avoid complicated/ complicating Sx.  I will be happy to see him in OP f/u to reassess.  At this point, I do not think a ST is necessary. He will need a PCP more than a cardiologist.  Contact us when he is ready for d/c & we will help arrange f/u.   LOS: 1 day   Kahmari Koller W, M.D., M.S. [facility] [address] [place], [place]  [address]  [phone] Pager # [phone] 07/24/2013 6:47 PM

## 2013-07-24 NOTE — Progress Notes (Signed)
[facility] PROGRESS NOTE  Jeffrey Black GMW:[id number] DOB: 10-04-1961 DOA: 07/23/2013 PCP: No primary provider on file. Brief HPI:  52 year old male with a history of recurrent alcohol abuse and intoxication as well as psoriasis presents after the patient was found wandering around without his pants on. The patient was intoxicated, and [employer] was called and the patient was brought to the emergency department. Apparently the patient had passed out and fallen on the stairs and hit his chest on some steps. The patient states that he had been drinking 2x half a gallon of 100 proof vodka and Everclear the night before admission. The patient states that he has been drinking three quarters of a gallon of EverClear alcohol on a daily basis. The patient has been staying at [facility]. The patient was recently discharged from [facility] on 04/20/2013 after a recent stay for suicidal ideation. In fact, the patient has been multiple visits to [facility] for alcohol detox. He has had numerous admissions for alcohol intoxication. In the ED, the patient was noted to be tachycardic initially into 130s. On telemetry, the patient was noted to have a short run of tachycardia that appeared to be wide-complex. [facility] was called and have seen the patient. They wanted the patient to be admitted in light of the dysrhythmia and intoxication. The patient had been previously prescribed metoprolol tartrate, but he does not take the medications on regular basis. He was admitted to medical service, cardiology consulted. He was started on CIWA Protocol and echo cardiogram ordered.     Assessment/Plan: Alcohol intoxication with early signs of withdrawal  -Continue alcohol withdrawal protocol  -Patient has required Precedex in past due to DTs  -Continue thiamine, folic acid, multivitamin  -Urine drug screen negative  Tachycardia  - resolved.  -Has been a chronic problem in the past  -Has been partly attributed to volume  depletion  -Patient had short run of wide-complex tachycardia on telemetry  -Patient likely has rebound tachycardia as well due to his noncompliance with metoprolol tartrate For which he was prescribed in the past for nonspecific tachycardia  -Continue IV fluids  -04/15/13 TSH 1.202  -Repeat TSH  Normal. -02/28/2039 echocardiogram shows EF 55-60%, No WMA   Epigastric pain  -Check lipase  -Likely due to alcoholic gastritis  -Continue PPI  -Continue IV fluids  -05/03/2013 EGD shows suspected Barrett's esophagus, Grade B esophagitis, erosive gastritis  AKI  -Serum creatinine 1.40 the time of admission  -Serum creatinine improved to 1.00 after 3 L of fluid  Tobacco use  -Smokes 1-1/2 packs per day x30 years  -Tobacco cessation discussed  -Nicoderm patch  Chest pain  -Likely due to trauma from falling  -Morphine when necessary  - ACS unlikely , troponins negative, elevated CK and elevated CKMB. Currently chest pain free.  Metabolic acidosis  -Likely due to alcoholic ketosis  - lactic acid  normal -Continue IV fluids  -Monitor BMP  Bipolar disorder  -Continue Seroquel previously prescribed by psychiatry      Code Status: full code Family Communication: none atbedside Disposition Plan: pending.    Consultants:  cardiology  Procedures:  echo  Antibiotics:  none  HPI/Subjective: Comfortable. No chest pain or sob. calm Objective: Filed Vitals:   07/24/13 1041  BP: 104/64  Pulse: 91  Temp:   Resp:     Intake/Output Summary (Last 24 hours) at 07/24/13 1131 Last data filed at 07/24/13 0825  Gross per 24 hour  Intake 1858.75 ml  Output  925 ml  Net 933.75 ml   Filed Weights   07/23/13 1430  Weight: 90.719 kg (200 lb)    Exam:   General:  Alert afebrile comfortable  Cardiovascular: s1s2  Respiratory: CTAB  Abdomen: soft NT ND BS+  Musculoskeletal: no pedal edema.   Data Reviewed: Basic Metabolic Panel:  Recent Labs Lab 07/23/13 0143  07/23/13 0708 07/23/13 1155 07/24/13 0555  NA 142 138  --  142  K 4.0 4.4  --  3.5  CL 108 106  --  108  CO2  --   --   --  24  GLUCOSE 131* 124*  --  82  BUN 32* 26*  --  14  CREATININE 1.40* 1.00  --  0.63  CALCIUM  --   --   --  8.5  MG  --   --  1.9 2.1   Liver Function Tests:  Recent Labs Lab 07/24/13 0555  AST 53*  ALT 31  ALKPHOS 61  BILITOT 0.9  PROT 5.4*  ALBUMIN 2.8*    Recent Labs Lab 07/23/13 1646  LIPASE 37   No results found for this basename: AMMONIA,  in the last 168 hours CBC:  Recent Labs Lab 07/23/13 0136 07/23/13 0143 07/23/13 0708 07/24/13 0555  WBC 15.3*  --   --  7.3  NEUTROABS 13.0*  --   --   --   HGB 14.5 15.6 12.2* 10.7*  HCT 42.5 46.0 36.0* 31.3*  MCV 92.2  --   --  92.3  PLT 211  --   --  116*   Cardiac Enzymes:  Recent Labs Lab 07/23/13 1155 07/23/13 1750 07/23/13 2342 07/24/13 0555  CKTOTAL  --  2280*  --   --   CKMB  --  8.4*  --   --   TROPONINI <0.30 <0.30 <0.30 <0.30   BNP (last 3 results)  Recent Labs  11/08/12 0540  PROBNP 34.6   CBG: No results found for this basename: GLUCAP,  in the last 168 hours  No results found for this or any previous visit (from the past 240 hour(s)).   Studies: Dg Chest 2 View  07/23/2013   *RADIOLOGY REPORT*  Clinical Data: The patient fell down stairs.  Chest pain and labored breathing.  Intoxicated.  CHEST - 2 VIEW  Comparison: 07/15/2013  Findings: Shallow inspiration. The heart size and pulmonary vascularity are normal. The lungs appear clear and expanded without focal air space disease or consolidation. No blunting of the costophrenic angles.  No pneumothorax.  Mediastinal contours appear intact.  No significant changes since previous study.  IMPRESSION: No evidence of active pulmonary disease.   Original Report Authenticated By: Burman Nieves, M.D.    Scheduled Meds: . escitalopram  20 mg Oral Daily  . folic acid  1 mg Oral Daily  . gabapentin  400 mg Oral TID  .  LORazepam  0-4 mg Oral Q6H   Followed by  . [START ON 07/25/2013] LORazepam  0-4 mg Oral Q12H  . metoprolol tartrate  25 mg Oral BID  . multivitamin with minerals  1 tablet Oral Daily  . pantoprazole  40 mg Oral BID AC  . QUEtiapine  200 mg Oral QHS  . QUEtiapine  50 mg Oral BID  . sodium chloride  3 mL Intravenous Q12H  . thiamine  100 mg Oral Daily   Or  . thiamine  100 mg Intravenous Daily   Continuous Infusions: . sodium chloride 75 mL/hr at 07/23/13  1625    Active Problems:   Alcohol intoxication in active alcoholic, acute   Abnormal EKG   Tachyarrhythmia    Time spent: 25 min    Jeffrey Black  [facility] Pager [phone]. If 7PM-7AM, please contact night-coverage at [email or web address], password [facility] 07/24/2013, 11:31 AM  LOS: 1 day

## 2013-07-25 LAB — BASIC METABOLIC PANEL
CO2: 26 mEq/L (ref 19–32)
Chloride: 104 mEq/L (ref 96–112)
Glucose, Bld: 105 mg/dL — ABNORMAL HIGH (ref 70–99)
Potassium: 3.3 mEq/L — ABNORMAL LOW (ref 3.5–5.1)
Sodium: 138 mEq/L (ref 135–145)

## 2013-07-25 LAB — IRON AND TIBC
Iron: 87 ug/dL (ref 42–135)
TIBC: 243 ug/dL (ref 215–435)

## 2013-07-25 LAB — VITAMIN B12: Vitamin B-12: 439 pg/mL (ref 211–911)

## 2013-07-25 LAB — FOLATE: Folate: >20 ng/mL

## 2013-07-25 MED ORDER — OXYCODONE HCL 5 MG PO TABS
5.0000 mg | ORAL_TABLET | ORAL | Status: DC | PRN
  Administered 2013-07-25: 5 mg via ORAL
  Administered 2013-07-26 – 2013-07-30 (×6): 10 mg via ORAL
  Filled 2013-07-25 (×2): qty 2
  Filled 2013-07-25: qty 1
  Filled 2013-07-25 (×4): qty 2

## 2013-07-25 NOTE — Progress Notes (Signed)
[facility] PROGRESS NOTE  JULY Jeffrey Black:[id number] DOB: 05-02-1961 DOA: 07/23/2013 PCP: No primary provider on file. Brief HPI:  52 year old male with a history of recurrent alcohol abuse and intoxication as well as psoriasis presents after the patient was found wandering around without his pants on. The patient was intoxicated, and [employer] was called and the patient was brought to the emergency department. Apparently the patient had passed out and fallen on the stairs and hit his chest on some steps. The patient states that he had been drinking 2x half a gallon of 100 proof vodka and Everclear the night before admission. The patient states that he has been drinking three quarters of a gallon of EverClear alcohol on a daily basis. The patient has been staying at [facility]. The patient was recently discharged from [facility] on 04/20/2013 after a recent stay for suicidal ideation. In fact, the patient has been multiple visits to [facility] for alcohol detox. He has had numerous admissions for alcohol intoxication. In the ED, the patient was noted to be tachycardic initially into 130s. On telemetry, the patient was noted to have a short run of tachycardia that appeared to be wide-complex. [facility] was called and have seen the patient. They wanted the patient to be admitted in light of the dysrhythmia and intoxication. The patient had been previously prescribed metoprolol tartrate, but he does not take the medications on regular basis. He was admitted to medical service, cardiology consulted. He was started on CIWA Protocol and echo cardiogram ordered.     Assessment/Plan: Alcohol intoxication with early signs of withdrawal  -Continue alcohol withdrawal protocol  -Patient has required Precedex in past due to DTs  -Continue thiamine, folic acid, multivitamin  -Urine drug screen negative  Tachycardia  - resolved.  -Has been a chronic problem in the past  -Has been partly attributed to volume  depletion  -Patient had short run of wide-complex tachycardia on telemetry  -Patient likely has rebound tachycardia as well due to his noncompliance with metoprolol tartrate For which he was prescribed in the past for nonspecific tachycardia  -Continue IV fluids  -04/15/13 TSH 1.202  -Repeat TSH  Normal. -02/28/2039 echocardiogram shows EF 55-60%, No WMA   Epigastric pain   -Likely due to alcoholic gastritis  -Continue PPI  -Continue IV fluids  -05/03/2013 EGD shows suspected Barrett's esophagus, Grade B esophagitis, erosive gastritis  AKI  -Serum creatinine 1.40 the time of admission  -Serum creatinine improved to 1.00 after 3 L of fluid  Tobacco use  -Smokes 1-1/2 packs per day x30 years  -Tobacco cessation discussed  -Nicoderm patch  Chest pain  -Likely due to trauma from falling  -Morphine when necessary  - ACS unlikely , troponins negative, elevated CK and elevated CKMB. Currently chest pain free.  Metabolic acidosis  -Likely due to alcoholic ketosis  - lactic acid  normal -Continue IV fluids  -Monitor BMP today Bipolar disorder  -Continue Seroquel previously prescribed by psychiatry      Code Status: full code Family Communication: none atbedside Disposition Plan: pending.    Consultants:  cardiology  Procedures:  echo  Antibiotics:  none  HPI/Subjective: Comfortable. No chest pain or sob. calm Objective: Filed Vitals:   07/25/13 0632  BP: 159/84  Pulse: 75  Temp: 97.3 F (36.3 C)  Resp: 12    Intake/Output Summary (Last 24 hours) at 07/25/13 1004 Last data filed at 07/25/13 0600  Gross per 24 hour  Intake   1830 ml  Output  1100 ml  Net    730 ml   Filed Weights   07/23/13 1430  Weight: 90.719 kg (200 lb)    Exam:   General:  Alert afebrile comfortable  Cardiovascular: s1s2  Respiratory: CTAB  Abdomen: soft NT ND BS+  Musculoskeletal: no pedal edema.   Data Reviewed: Basic Metabolic Panel:  Recent Labs Lab  07/23/13 0143 07/23/13 0708 07/23/13 1155 07/24/13 0555  NA 142 138  --  142  K 4.0 4.4  --  3.5  CL 108 106  --  108  CO2  --   --   --  24  GLUCOSE 131* 124*  --  82  BUN 32* 26*  --  14  CREATININE 1.40* 1.00  --  0.63  CALCIUM  --   --   --  8.5  MG  --   --  1.9 2.1   Liver Function Tests:  Recent Labs Lab 07/24/13 0555  AST 53*  ALT 31  ALKPHOS 61  BILITOT 0.9  PROT 5.4*  ALBUMIN 2.8*    Recent Labs Lab 07/23/13 1646  LIPASE 37   No results found for this basename: AMMONIA,  in the last 168 hours CBC:  Recent Labs Lab 07/23/13 0136 07/23/13 0143 07/23/13 0708 07/24/13 0555  WBC 15.3*  --   --  7.3  NEUTROABS 13.0*  --   --   --   HGB 14.5 15.6 12.2* 10.7*  HCT 42.5 46.0 36.0* 31.3*  MCV 92.2  --   --  92.3  PLT 211  --   --  116*   Cardiac Enzymes:  Recent Labs Lab 07/23/13 1155 07/23/13 1750 07/23/13 2342 07/24/13 0555  CKTOTAL  --  2280*  --   --   CKMB  --  8.4*  --   --   TROPONINI <0.30 <0.30 <0.30 <0.30   BNP (last 3 results)  Recent Labs  11/08/12 0540  PROBNP 34.6   CBG: No results found for this basename: GLUCAP,  in the last 168 hours  No results found for this or any previous visit (from the past 240 hour(s)).   Studies: No results found.  Scheduled Meds: . escitalopram  20 mg Oral Daily  . folic acid  1 mg Oral Daily  . gabapentin  400 mg Oral TID  . LORazepam  0-4 mg Oral Q12H  . metoprolol tartrate  25 mg Oral BID  . multivitamin with minerals  1 tablet Oral Daily  . pantoprazole  40 mg Oral BID AC  . QUEtiapine  200 mg Oral QHS  . QUEtiapine  50 mg Oral BID  . sodium chloride  3 mL Intravenous Q12H  . thiamine  100 mg Oral Daily   Or  . thiamine  100 mg Intravenous Daily   Continuous Infusions: . sodium chloride 75 mL/hr at 07/23/13 1625    Active Problems:   Alcohol intoxication in active alcoholic, acute   Abnormal EKG   Tachyarrhythmia    Time spent: 25 min    Ryson Bacha  [facility] Pager [phone]. If 7PM-7AM, please contact night-coverage at [email or web address], password [facility] 07/25/2013, 10:04 AM  LOS: 2 days

## 2013-07-25 NOTE — Progress Notes (Signed)
Patient becoming even more restless and agitated. Bed alarm being set off frequently. Does settle down a little bit when this RN spends some time in room with patient. Ginny Forth

## 2013-07-25 NOTE — Progress Notes (Signed)
Pt becoming a little more restless, tremors visible and now states he is seeing people and that they are sometimes talking to him-he realizes he is hallucinating. Continue to monitor. Ginny Forth

## 2013-07-25 NOTE — Progress Notes (Signed)
Nutrition Brief Note  Patient identified on the Malnutrition Screening Tool (MST) Report  Wt Readings from Last 15 Encounters:  07/23/13 200 lb (90.719 kg)  07/14/13 200 lb (90.719 kg)  06/25/13 200 lb (90.719 kg)  05/18/13 193 lb 2 oz (87.6 kg)  04/29/13 203 lb 11.3 oz (92.4 kg)  04/29/13 203 lb 11.3 oz (92.4 kg)  04/17/13 210 lb (95.255 kg)  04/16/13 208 lb (94.348 kg)  04/13/13 209 lb 10.5 oz (95.1 kg)  02/22/13 211 lb 3.2 oz (95.8 kg)    Body mass index is 27.12 kg/(m^2). Patient meets criteria for Overweight based on current BMI.   Current diet order is Heart Healthy, patient is consuming approximately 75-100% of meals at this time. Pt fell asleep during assessment. RN reports pt is eating very well and ate 100% of meals today. RN states that pt doesn't eat well when he binge drinks but, today he is asking for food as soon as he wakes up. Per weight history pt has maintained weight at 200 lbs for the past month. Labs and medications reviewed.   No nutrition interventions warranted at this time. If nutrition issues arise, please consult RD.   Ian Malkin RD, LDN Inpatient Clinical Dietitian Pager: [phone] After Hours Pager: [phone]

## 2013-07-25 NOTE — Progress Notes (Signed)
Patient seeing things in room which aren't there and is talking to them. Tremulous. Speech mumbling. IV ativan given and patient does drift off to sleep for few minutes at a time but then wakes up frequently. Continue to monitor. Ginny Forth

## 2013-07-25 NOTE — Care Management Note (Addendum)
    Page 1 of 2   07/27/2013     2:52:41 PM   CARE MANAGEMENT NOTE 07/27/2013  Patient:  Jeffrey Black, Jeffrey Black   Account Number:  [id number]  Date Initiated:  07/24/2013  Documentation initiated by:  [facility]  Subjective/Objective Assessment:   ETOH abuse, falls     Action/Plan:   lives at [facility], multiple admission in recent months   Anticipated DC Date:  07/29/2013   Anticipated DC Plan:  HOME/SELF CARE  In-house referral  Clinical Social Worker      DC Planning Services  CM consult  Medication Assistance      Choice offered to / List presented to:             Status of service:  In process, will continue to follow Medicare Important Message given?   (If response is "NO", the following Medicare IM given date fields will be blank) Date Medicare IM given:   Date Additional Medicare IM given:    Discharge Disposition:    Per UR Regulation:  Reviewed for med. necessity/level of care/duration of stay  If discussed at Long Length of Stay Meetings, dates discussed:    Comments:  07/27/13 Callen Vancuren RN,BSN NCM [phone] CIWA PROTOCAL,FEVER-IV ABX.D/C PLAN-EXTENDED STAY HOTEL.HAS SCRIPT COVERAGE.NO ANTICIPATED D/C NEEDS.  07/26/13 Lorenna Lurry RN,BSN NCM [phone] NO ANTICIPATED D/C NEEDS.  07/25/13 Neev Mcmains RN,BSN NCM [phone] PATIENT HAS SCRIPT COVERAGE ACTIVE SINCE 7/1.  07/24/2013 1300 NCM spoke to pt and states his insurance will end on Wednesday. States he gets his meds from [facility]. States he has not been to [employer]. His last job was in January. Pt was lethargic from medications to continue NCM consult. NCM will continue to follow up on meds assistance. Will do a benefit check on insurance and medication coverage. Will have weekday NCM follow up with [facility] on meds and scheduling an appt. Glyn Ade RN CCM Case Mgmt phone [phone]  07/24/2013 1111 Referral to CSW for follow up on living situation and assistance with drug rehab programs. Isidoro Donning RN CCM Case Mgmt phone [phone]

## 2013-07-25 NOTE — Progress Notes (Signed)
Subjective: Hallucinating, tremors.  HR controlled.  Lethargic   Objective: Vital signs in last 24 hours: Temp:  [97.3 F (36.3 C)-98.5 F (36.9 C)] 97.3 F (36.3 C) (09/08 1610) Pulse Rate:  [75-103] 75 (09/08 0632) Resp:  [12-20] 12 (09/08 9604) BP: (125-159)/(79-84) 159/84 mmHg (09/08 0632) SpO2:  [96 %-98 %] 96 % (09/08 5409) Weight change:  Last BM Date: 07/22/13 Intake/Output from previous day: +1005 09/07 0701 - 09/08 0700 In: 2430 [P.O.:1080; I.V.:1350] Out: 2025 [Urine:2025] Intake/Output this shift:    PE: General:somnolent, arouses but does not answer questions, NAD Skin:Warm and dry, brisk capillary refill HEENT:normocephalic, sclera clear, mucus membranes moist Heart:S1S2 RRR without murmur, gallup, rub or click Lungs:clear ant. without rales, rhonchi, or wheezes WJX:BJYN, non tender, + BS, do not palpate liver spleen or masses Ext:no lower ext edema, 2+ pedal pulses Neuro: somnolent     Lab Results:  Recent Labs  07/23/13 0136  07/23/13 0708 07/24/13 0555  WBC 15.3*  --   --  7.3  HGB 14.5  < > 12.2* 10.7*  HCT 42.5  < > 36.0* 31.3*  PLT 211  --   --  116*  < > = values in this interval not displayed. BMET  Recent Labs  07/24/13 0555 07/25/13 1050  NA 142 138  K 3.5 3.3*  CL 108 104  CO2 24 26  GLUCOSE 82 105*  BUN 14 10  CREATININE 0.63 0.59  CALCIUM 8.5 8.9    Recent Labs  07/23/13 2342 07/24/13 0555  TROPONINI <0.30 <0.30    No results found for this basename: CHOL, HDL, LDLCALC, LDLDIRECT, TRIG, CHOLHDL   Lab Results  Component Value Date   HGBA1C 6.1* 02/24/2013     Lab Results  Component Value Date   TSH 0.728 07/23/2013    Hepatic Function Panel  Recent Labs  07/24/13 0555  PROT 5.4*  ALBUMIN 2.8*  AST 53*  ALT 31  ALKPHOS 61  BILITOT 0.9     Studies/Results:  2D echo 07/24/13 Left ventricle: The cavity size was normal. Wall thickness was normal. Systolic function was vigorous - near  cavity obleteration. The estimated ejection fraction was in the range of 65% to 70%. Although no diagnostic regional wall motion abnormality was identified, this possibility cannot be completely excluded on the basis of this study. Doppler parameters are consistent with abnormal left ventricular relaxation (grade 1 diastolic dysfunction). - Left atrium: The atrium was mildly dilated. - Atrial septum: No defect or patent foramen ovale was identified.   Medications: I have reviewed the patient's current medications. Scheduled Meds: . escitalopram  20 mg Oral Daily  . folic acid  1 mg Oral Daily  . gabapentin  400 mg Oral TID  . LORazepam  0-4 mg Oral Q12H  . metoprolol tartrate  25 mg Oral BID  . multivitamin with minerals  1 tablet Oral Daily  . pantoprazole  40 mg Oral BID AC  . QUEtiapine  200 mg Oral QHS  . QUEtiapine  50 mg Oral BID  . sodium chloride  3 mL Intravenous Q12H  . thiamine  100 mg Oral Daily   Or  . thiamine  100 mg Intravenous Daily   Continuous Infusions: . sodium chloride 75 mL/hr at 07/23/13 1625   PRN Meds:.LORazepam, LORazepam, morphine injection, ondansetron (ZOFRAN) IV, ondansetron  Assessment/Plan: Active Problems:   Alcohol intoxication in active alcoholic, acute   Abnormal EKG   Tachyarrhythmia  PLAN: pt on medication maintaining  SR on BB.  Will arrange outpt monitor if pt has a place to live.  Please let us know day of D/C to arrange.   LOS: 2 days   Time spent with pt. :15 minutes. [facility] R  Nurse Practitioner Certified Pager [phone] 07/25/2013, 12:14 PM   Agree with note written by Nada Boozer RNP  No further arhythmias. On low dose BB. 2D nl. Suspect this is all related to ETOH. F/U with Dr. Herbie Baltimore as an OP. Exam benign.  Runell Gess 07/25/2013 6:12 PM

## 2013-07-25 NOTE — Progress Notes (Signed)
Patient pulled IV out earlier this am. Has been sleeping soundly since that time. Will not awaken patient at this time to restart IV due to patient finally resting. Will attempt restart when patient awakens. Jeffrey Black

## 2013-07-26 ENCOUNTER — Inpatient Hospital Stay (HOSPITAL_COMMUNITY): Payer: PRIVATE HEALTH INSURANCE

## 2013-07-26 DIAGNOSIS — R000 Tachycardia, unspecified: Secondary | ICD-10-CM

## 2013-07-26 LAB — URINE MICROSCOPIC-ADD ON

## 2013-07-26 LAB — CBC
Hemoglobin: 11 g/dL — ABNORMAL LOW (ref 13.0–17.0)
MCH: 31.3 pg (ref 26.0–34.0)
MCV: 93.2 fL (ref 78.0–100.0)
Platelets: 89 10*3/uL — ABNORMAL LOW (ref 150–400)
RBC: 3.52 MIL/uL — ABNORMAL LOW (ref 4.22–5.81)
WBC: 4.9 10*3/uL (ref 4.0–10.5)

## 2013-07-26 LAB — BASIC METABOLIC PANEL
CO2: 28 mEq/L (ref 19–32)
Calcium: 8.8 mg/dL (ref 8.4–10.5)
Chloride: 101 mEq/L (ref 96–112)
Creatinine, Ser: 0.79 mg/dL (ref 0.50–1.35)
Glucose, Bld: 112 mg/dL — ABNORMAL HIGH (ref 70–99)

## 2013-07-26 LAB — URINALYSIS, ROUTINE W REFLEX MICROSCOPIC
Bilirubin Urine: NEGATIVE
Glucose, UA: NEGATIVE mg/dL
Hgb urine dipstick: NEGATIVE
Ketones, ur: NEGATIVE mg/dL
Protein, ur: NEGATIVE mg/dL
Urobilinogen, UA: 1 mg/dL (ref 0.0–1.0)

## 2013-07-26 LAB — PROTIME-INR: Prothrombin Time: 12.4 seconds (ref 11.6–15.2)

## 2013-07-26 LAB — LACTIC ACID, PLASMA: Lactic Acid, Venous: 1.6 mmol/L (ref 0.5–2.2)

## 2013-07-26 MED ORDER — KETOROLAC TROMETHAMINE 15 MG/ML IJ SOLN
15.0000 mg | Freq: Three times a day (TID) | INTRAMUSCULAR | Status: DC | PRN
  Administered 2013-07-26: 15 mg via INTRAVENOUS
  Filled 2013-07-26: qty 1

## 2013-07-26 MED ORDER — METOPROLOL TARTRATE 50 MG PO TABS
50.0000 mg | ORAL_TABLET | Freq: Two times a day (BID) | ORAL | Status: DC
  Administered 2013-07-26 – 2013-07-30 (×8): 50 mg via ORAL
  Filled 2013-07-26 (×9): qty 1

## 2013-07-26 MED ORDER — ACETAMINOPHEN 325 MG PO TABS
650.0000 mg | ORAL_TABLET | ORAL | Status: DC | PRN
  Administered 2013-07-26 – 2013-07-28 (×3): 650 mg via ORAL
  Filled 2013-07-26 (×3): qty 2

## 2013-07-26 MED ORDER — LORAZEPAM 1 MG PO TABS
1.0000 mg | ORAL_TABLET | Freq: Once | ORAL | Status: AC
  Administered 2013-07-26: 1 mg via ORAL
  Filled 2013-07-26: qty 1

## 2013-07-26 NOTE — Plan of Care (Cosign Needed)
RN paged- pt w/ fever 102- had issues with foley self removed with balloon inflated last night with assoc. Hematuria- will check UA/cx, blood cx's, PCXR- will also provide Tylenol for fever and Toradol for rigors- Ativan x 1 dose for endorsed anxiety  Junious Silk, ANP

## 2013-07-26 NOTE — Plan of Care (Cosign Needed)
RN text paged: pt admitted with DT's and subsequently pulled out foley catheter. Able to void 400 cc but this associated with clots. Orders placed to call MD if unable to void. Also will check am labs at 6 am including CBC to ensure Hgb remains stable.Platelets at admission were low at 116,000. Will also check PT/INR/PTT. Medications reviewed and pt not on any anticoagulation. If hematuria does not improve may require urology evaluation  UPDATE: 547 am- hgb stable at 11 with normal coags " "             641 am- platelets have decreased to 89,000  Junious Silk, ANP

## 2013-07-26 NOTE — Progress Notes (Signed)
Pt voided 400 cc's of bright red blood with several large clots. Patient had pulled out foley catheter several days ago when in full DT's. Pt denies any self injury. Cleaned up and given some pain meds for pain both in chest area and uretheral area. [facility] night floor coverage notified. Will continue to monitor. Ginny Forth

## 2013-07-26 NOTE — Progress Notes (Signed)
At bedside while patient voiding in urinal, patient had blood surrounding urinal while voiding and on hands.  Patient voided 375cc of reddish urine without clots. Per RN from night shift, patient voided clots varying in sizes 2 times.  Will continue to monitor.

## 2013-07-26 NOTE — Progress Notes (Signed)
This RN at bedside while patient voiding. First approximately 300 cc's of urine flow is clear amber and then turns to bright red blood with clots at end of stream. Fewer clots this time but one clot is about the size of an egg. Will continue to monitor and be on alert for am labs. Ginny Forth

## 2013-07-26 NOTE — Progress Notes (Signed)
[facility] PROGRESS NOTE  Jeffrey Black UJW:[id number] DOB: [birth year] DOA: 07/23/2013 PCP: No primary provider on file. Brief HPI:  52 year old male with a history of recurrent alcohol abuse and intoxication as well as psoriasis presents after the patient was found wandering around without his pants on. The patient was intoxicated, and [facility] was called and the patient was brought to the emergency department. Apparently the patient had passed out and fallen on the stairs and hit his chest on some steps. The patient states that he had been drinking 2x half a gallon of 100 proof vodka and Everclear the night before admission. The patient states that he has been drinking three quarters of a gallon of EverClear alcohol on a daily basis. The patient has been staying at [facility]. The patient was recently discharged from [facility] on 04/20/2013 after a recent stay for suicidal ideation. In fact, the patient has been multiple visits to [facility] for alcohol detox. He has had numerous admissions for alcohol intoxication. In the ED, the patient was noted to be tachycardic initially into 130s. On telemetry, the patient was noted to have a short run of tachycardia that appeared to be wide-complex. [facility] was called and have seen the patient. They wanted the patient to be admitted in light of the dysrhythmia and intoxication. The patient had been previously prescribed metoprolol tartrate, but he does not take the medications on regular basis. He was admitted to medical service, cardiology consulted. He was started on CIWA Protocol and echo cardiogram ordered. Echocardiogram showed left ventricular EF of 65% with grade 1 diastolic dysfunction. Continue to monitor on telemetry and treat tachycardia with BB. Cardiology recommended outpatient monitor and requested to be notified at the time of discharge to arrange for it.     Assessment/Plan: Alcohol intoxication with early signs of withdrawal  -Continue alcohol  withdrawal protocol  -Patient has required Precedex in past due to DTs  -Continue thiamine, folic acid, multivitamin  -Urine drug screen negative   Tachycardia  - improving.  -Has been a chronic problem in the past  -Has been partly attributed to volume depletion and rebound from being on and off of b blockers. -Patient likely has rebound tachycardia as well due to his noncompliance with metoprolol tartrate For which he was prescribed in the past for nonspecific tachycardia  -Patient had short run of wide-complex tachycardia on telemetry  -Continue IV fluids  -04/15/13 TSH 1.202  -Repeat TSH  Normal. -02/28/2039 echocardiogram shows EF 55-60%, No WMA   Epigastric pain   -Likely due to alcoholic gastritis  -Continue PPI  -Continue IV fluids  -05/03/2013 EGD shows suspected Barrett's esophagus, Grade B esophagitis, erosive gastritis  AKI  -Serum creatinine 1.40 the time of admission  -Serum creatinine improved to 1.00 after 3 L of fluid  Tobacco use  -Smokes 1-1/2 packs per day x30 years  -Tobacco cessation discussed  -Nicoderm patch  Chest pain  -Likely due to trauma from falling  -Morphine when necessary  - ACS unlikely , troponins negative, elevated CK and elevated CKMB. Currently chest pain free.  Metabolic acidosis  -Likely due to alcoholic ketosis  - lactic acid  normal -Continue IV fluids  -Monitor BMP today Bipolar disorder  -Continue Seroquel previously prescribed by psychiatry      Code Status: full code Family Communication: none atbedside Disposition Plan: pending. Possibly in 1 to 2 days.    Consultants:  cardiology  Procedures:  echo  Antibiotics:  none  HPI/Subjective: Comfortable. No chest  pain or sob. Calm and sleeping soundly. Objective: Filed Vitals:   07/26/13 0443  BP: 115/73  Pulse: 107  Temp: 97.4 F (36.3 C)  Resp: 20    Intake/Output Summary (Last 24 hours) at 07/26/13 1305 Last data filed at 07/26/13 0920  Gross per 24  hour  Intake 1978.75 ml  Output   2820 ml  Net -841.25 ml   Filed Weights   07/23/13 1430  Weight: 90.719 kg (200 lb)    Exam:   General:  Alert afebrile comfortable  Cardiovascular: s1s2  Respiratory: CTAB  Abdomen: soft NT ND BS+  Musculoskeletal: no pedal edema.   Data Reviewed: Basic Metabolic Panel:  Recent Labs Lab 07/23/13 0143 07/23/13 0708 07/23/13 1155 07/24/13 0555 07/25/13 1050 07/26/13 0525  NA 142 138  --  142 138 137  K 4.0 4.4  --  3.5 3.3* 3.6  CL 108 106  --  108 104 101  CO2  --   --   --  24 26 28   GLUCOSE 131* 124*  --  82 105* 112*  BUN 32* 26*  --  14 10 10   CREATININE 1.40* 1.00  --  0.63 0.59 0.79  CALCIUM  --   --   --  8.5 8.9 8.8  MG  --   --  1.9 2.1  --   --    Liver Function Tests:  Recent Labs Lab 07/24/13 0555  AST 53*  ALT 31  ALKPHOS 61  BILITOT 0.9  PROT 5.4*  ALBUMIN 2.8*    Recent Labs Lab 07/23/13 1646  LIPASE 37   No results found for this basename: AMMONIA,  in the last 168 hours CBC:  Recent Labs Lab 07/23/13 0136 07/23/13 0143 07/23/13 0708 07/24/13 0555 07/26/13 0525  WBC 15.3*  --   --  7.3 4.9  NEUTROABS 13.0*  --   --   --   --   HGB 14.5 15.6 12.2* 10.7* 11.0*  HCT 42.5 46.0 36.0* 31.3* 32.8*  MCV 92.2  --   --  92.3 93.2  PLT 211  --   --  116* 89*   Cardiac Enzymes:  Recent Labs Lab 07/23/13 1155 07/23/13 1750 07/23/13 2342 07/24/13 0555  CKTOTAL  --  2280*  --   --   CKMB  --  8.4*  --   --   TROPONINI <0.30 <0.30 <0.30 <0.30   BNP (last 3 results)  Recent Labs  11/08/12 0540  PROBNP 34.6   CBG: No results found for this basename: GLUCAP,  in the last 168 hours  No results found for this or any previous visit (from the past 240 hour(s)).   Studies: No results found.  Scheduled Meds: . escitalopram  20 mg Oral Daily  . folic acid  1 mg Oral Daily  . gabapentin  400 mg Oral TID  . LORazepam  0-4 mg Oral Q12H  . metoprolol tartrate  50 mg Oral BID  .  multivitamin with minerals  1 tablet Oral Daily  . pantoprazole  40 mg Oral BID AC  . QUEtiapine  200 mg Oral QHS  . QUEtiapine  50 mg Oral BID  . sodium chloride  3 mL Intravenous Q12H  . thiamine  100 mg Oral Daily   Or  . thiamine  100 mg Intravenous Daily   Continuous Infusions: . sodium chloride 75 mL/hr at 07/26/13 0122    Active Problems:   Alcohol intoxication in active alcoholic, acute  Abnormal EKG   Tachyarrhythmia    Time spent: 25 min    Rubie Ficco  [facility] Pager [phone]. If 7PM-7AM, please contact night-coverage at [email or web address], password [facility] 07/26/2013, 1:05 PM  LOS: 3 days

## 2013-07-26 NOTE — Progress Notes (Signed)
Patient voided 300 cc of yellow urine with very minimal amount of blood on hand.  Will continue to monitor urine output.

## 2013-07-27 DIAGNOSIS — R509 Fever, unspecified: Secondary | ICD-10-CM | POA: Diagnosis not present

## 2013-07-27 DIAGNOSIS — N390 Urinary tract infection, site not specified: Secondary | ICD-10-CM | POA: Diagnosis not present

## 2013-07-27 LAB — CBC
HCT: 29.3 % — ABNORMAL LOW (ref 39.0–52.0)
Hemoglobin: 9.3 g/dL — ABNORMAL LOW (ref 13.0–17.0)
MCH: 31.2 pg (ref 26.0–34.0)
MCH: 31.2 pg (ref 26.0–34.0)
MCHC: 33.4 g/dL (ref 30.0–36.0)
MCV: 93 fL (ref 78.0–100.0)
RBC: 2.98 MIL/uL — ABNORMAL LOW (ref 4.22–5.81)
RDW: 13.4 % (ref 11.5–15.5)
WBC: 8.4 10*3/uL (ref 4.0–10.5)

## 2013-07-27 LAB — BASIC METABOLIC PANEL
BUN: 12 mg/dL (ref 6–23)
Calcium: 8.2 mg/dL — ABNORMAL LOW (ref 8.4–10.5)
Creatinine, Ser: 0.81 mg/dL (ref 0.50–1.35)
GFR calc non Af Amer: >90 mL/min (ref >90)
Glucose, Bld: 98 mg/dL (ref 70–99)
Sodium: 133 mEq/L — ABNORMAL LOW (ref 135–145)

## 2013-07-27 MED ORDER — LORAZEPAM 2 MG/ML IJ SOLN
0.0000 mg | Freq: Two times a day (BID) | INTRAMUSCULAR | Status: DC
  Administered 2013-07-29: 2 mg via INTRAVENOUS
  Filled 2013-07-27: qty 1

## 2013-07-27 MED ORDER — LORAZEPAM 2 MG/ML IJ SOLN
0.0000 mg | Freq: Four times a day (QID) | INTRAMUSCULAR | Status: AC
  Administered 2013-07-27 – 2013-07-28 (×2): 2 mg via INTRAVENOUS
  Filled 2013-07-27 (×4): qty 1

## 2013-07-27 MED ORDER — ADULT MULTIVITAMIN W/MINERALS CH
1.0000 | ORAL_TABLET | Freq: Every day | ORAL | Status: DC
  Administered 2013-07-27 – 2013-07-30 (×4): 1 via ORAL
  Filled 2013-07-27 (×4): qty 1

## 2013-07-27 MED ORDER — LORAZEPAM 2 MG/ML IJ SOLN
1.0000 mg | Freq: Four times a day (QID) | INTRAMUSCULAR | Status: AC | PRN
  Administered 2013-07-27 – 2013-07-29 (×2): 1 mg via INTRAVENOUS
  Filled 2013-07-27: qty 1

## 2013-07-27 MED ORDER — VITAMIN B-1 100 MG PO TABS
100.0000 mg | ORAL_TABLET | Freq: Every day | ORAL | Status: DC
  Administered 2013-07-27 – 2013-07-30 (×4): 100 mg via ORAL
  Filled 2013-07-27 (×4): qty 1

## 2013-07-27 MED ORDER — VANCOMYCIN HCL IN DEXTROSE 1-5 GM/200ML-% IV SOLN
1000.0000 mg | Freq: Three times a day (TID) | INTRAVENOUS | Status: DC
  Administered 2013-07-27: 07:00:00 1000 mg via INTRAVENOUS
  Filled 2013-07-27 (×2): qty 200

## 2013-07-27 MED ORDER — PIPERACILLIN-TAZOBACTAM 3.375 G IVPB
3.3750 g | Freq: Three times a day (TID) | INTRAVENOUS | Status: DC
  Administered 2013-07-27: 07:00:00 3.375 g via INTRAVENOUS
  Filled 2013-07-27 (×2): qty 50

## 2013-07-27 MED ORDER — THIAMINE HCL 100 MG/ML IJ SOLN
100.0000 mg | Freq: Every day | INTRAMUSCULAR | Status: DC
Start: 2013-07-27 — End: 2013-07-29
  Filled 2013-07-27 (×3): qty 1

## 2013-07-27 MED ORDER — IBUPROFEN 800 MG PO TABS
400.0000 mg | ORAL_TABLET | Freq: Three times a day (TID) | ORAL | Status: DC | PRN
  Administered 2013-07-27: 11:00:00 400 mg via ORAL
  Filled 2013-07-27: qty 1

## 2013-07-27 MED ORDER — LORAZEPAM 1 MG PO TABS
1.0000 mg | ORAL_TABLET | Freq: Four times a day (QID) | ORAL | Status: AC | PRN
  Administered 2013-07-27 – 2013-07-28 (×3): 1 mg via ORAL
  Filled 2013-07-27 (×3): qty 1

## 2013-07-27 MED ORDER — CEFTRIAXONE SODIUM 1 G IJ SOLR
1.0000 g | INTRAMUSCULAR | Status: DC
  Administered 2013-07-27 – 2013-07-28 (×2): 1 g via INTRAVENOUS
  Filled 2013-07-27 (×3): qty 10

## 2013-07-27 MED ORDER — FOLIC ACID 1 MG PO TABS
1.0000 mg | ORAL_TABLET | Freq: Every day | ORAL | Status: DC
  Administered 2013-07-27 – 2013-07-30 (×4): 1 mg via ORAL
  Filled 2013-07-27 (×4): qty 1

## 2013-07-27 NOTE — Progress Notes (Addendum)
[facility] PROGRESS NOTE  Jeffrey Black ZOX:[id number] DOB: 09/25/1961 DOA: 07/23/2013 PCP: No primary provider on file.  Assessment/Plan: Acute ETOH Intoxication with Withdrawals -Continue CIWA protocol. -Thiamine/folate  Fever/UTI -UA dirty, CXR negative, Blood/urine cx pending. -Was started on vanc/zosyn overnight. -Will narrow to Rocephin pending cx data. -Treat fever with tylenol/ibuprofen.  Hypokalemia -Replete PO. -Check Mag level.  Acidosis -Resolved.  Bipolar Disorder -Continue Seroquel.  Hematuria -Related to foley trauma. -No urinary retention. -Follow. -See no need for GU consult at present.  ABLA -2/2 hematuria. -Hb 8.6 from 11 on admission. -Transfusion threshold <7.0.  Code Status: Full Code Family Communication: Patient only  Disposition Plan: Home when medically stable.   Consultants:  None   Antibiotics:  Rocephin  Subjective: Has some dysuria and hematuria.  Objective: Filed Vitals:   07/27/13 0356 07/27/13 0420 07/27/13 0626 07/27/13 1026  BP: 165/96 165/96  132/97  Pulse: 106 106  115  Temp: 101.1 F (38.4 C)  103.1 F (39.5 C)   TempSrc: Oral  Oral   Resp: 20     Height:      Weight:      SpO2: 97%       Intake/Output Summary (Last 24 hours) at 07/27/13 1108 Last data filed at 07/27/13 0819  Gross per 24 hour  Intake   2255 ml  Output   3825 ml  Net  -1570 ml   Filed Weights   07/23/13 1430  Weight: 90.719 kg (200 lb)    Exam:   General:  AA Ox3, NAD  Cardiovascular: tachycardic, regular, no M/R/G  Respiratory: CTA B  Abdomen: S/NT/ND/+BS/no masses   Extremities: no C/C/E   Neurologic:  Non-focal.  Data Reviewed: Basic Metabolic Panel:  Recent Labs Lab 07/23/13 0708 07/23/13 1155 07/24/13 0555 07/25/13 1050 07/26/13 0525 07/27/13 0350  NA 138  --  142 138 137 133*  K 4.4  --  3.5 3.3* 3.6 3.0*  CL 106  --  108 104 101 99  CO2  --   --  24 26 28 28   GLUCOSE 124*  --  82 105* 112*  98  BUN 26*  --  14 10 10 12   CREATININE 1.00  --  0.63 0.59 0.79 0.81  CALCIUM  --   --  8.5 8.9 8.8 8.2*  MG  --  1.9 2.1  --   --   --    Liver Function Tests:  Recent Labs Lab 07/24/13 0555  AST 53*  ALT 31  ALKPHOS 61  BILITOT 0.9  PROT 5.4*  ALBUMIN 2.8*    Recent Labs Lab 07/23/13 1646  LIPASE 37   No results found for this basename: AMMONIA,  in the last 168 hours CBC:  Recent Labs Lab 07/23/13 0136 07/23/13 0143 07/23/13 0708 07/24/13 0555 07/26/13 0525 07/27/13 0350  WBC 15.3*  --   --  7.3 4.9 10.1  NEUTROABS 13.0*  --   --   --   --   --   HGB 14.5 15.6 12.2* 10.7* 11.0* 9.8*  HCT 42.5 46.0 36.0* 31.3* 32.8* 29.3*  MCV 92.2  --   --  92.3 93.2 93.3  PLT 211  --   --  116* 89* 69*   Cardiac Enzymes:  Recent Labs Lab 07/23/13 1155 07/23/13 1750 07/23/13 2342 07/24/13 0555  CKTOTAL  --  2280*  --   --   CKMB  --  8.4*  --   --   TROPONINI <0.30 <0.30 <0.30 <  0.30   BNP (last 3 results)  Recent Labs  11/08/12 0540  PROBNP 34.6   CBG: No results found for this basename: GLUCAP,  in the last 168 hours  No results found for this or any previous visit (from the past 240 hour(s)).   Studies: Dg Chest Port 1 View  07/26/2013   *RADIOLOGY REPORT*  Clinical Data: Fever  PORTABLE CHEST - 1 VIEW  Comparison: July 23, 2013.  Findings: Cardiomediastinal silhouette appears normal.  No acute pulmonary disease is noted.  Bony thorax is intact.  No pleural effusion or pneumothorax is noted.  IMPRESSION: No acute cardiopulmonary abnormality seen.   Original Report Authenticated By: Lupita Raider.,  M.D.    Scheduled Meds: . cefTRIAXone (ROCEPHIN)  IV  1 g Intravenous Q24H  . escitalopram  20 mg Oral Daily  . folic acid  1 mg Oral Daily  . gabapentin  400 mg Oral TID  . LORazepam  0-4 mg Intravenous Q6H   Followed by  . [START ON 07/29/2013] LORazepam  0-4 mg Intravenous Q12H  . metoprolol tartrate  50 mg Oral BID  . multivitamin with minerals   1 tablet Oral Daily  . pantoprazole  40 mg Oral BID AC  . QUEtiapine  200 mg Oral QHS  . QUEtiapine  50 mg Oral BID  . sodium chloride  3 mL Intravenous Q12H  . thiamine  100 mg Oral Daily   Or  . thiamine  100 mg Intravenous Daily   Continuous Infusions: . sodium chloride 75 mL/hr at 07/26/13 2305    Active Problems:   Alcohol intoxication in active alcoholic, acute   Abnormal EKG   Tachyarrhythmia    Time spent: 35 minutes.    Chaya Jan  [facility] Pager [phone]  If 7PM-7AM, please contact night-coverage at [email or web address], password [facility] 07/27/2013, 11:08 AM  LOS: 4 days

## 2013-07-27 NOTE — Progress Notes (Signed)
ANTIBIOTIC CONSULT NOTE - INITIAL  Pharmacy Consult for Vancomycinm, zosyn Indication: UTI  No Known Allergies  Patient Measurements: Height: 6' (182.9 cm) Weight: 200 lb (90.719 kg) IBW/kg (Calculated) : 77.6 Adjusted Body Weight:   Vital Signs: Temp: 103.1 F (39.5 C) (09/10 0626) Temp src: Oral (09/10 0626) BP: 165/96 mmHg (09/10 0420) Pulse Rate: 106 (09/10 0420) Intake/Output from previous day: 09/09 0701 - 09/10 0700 In: 1775 [I.V.:1775] Out: 3775 [Urine:3775] Intake/Output from this shift: Total I/O In: 1175 [I.V.:1175] Out: 2575 [Urine:2575]  Labs:  Recent Labs  07/25/13 1050 07/26/13 0525 07/27/13 0350  WBC  --  4.9 10.1  HGB  --  11.0* 9.8*  PLT  --  89* 69*  CREATININE 0.59 0.79 0.81   Estimated Creatinine Clearance: 117.1 ml/min (by C-G formula based on Cr of 0.81). No results found for this basename: VANCOTROUGH, VANCOPEAK, VANCORANDOM, GENTTROUGH, GENTPEAK, GENTRANDOM, TOBRATROUGH, TOBRAPEAK, TOBRARND, AMIKACINPEAK, AMIKACINTROU, AMIKACIN,  in the last 72 hours   Microbiology: No results found for this or any previous visit (from the past 720 hour(s)).  Medical History: Past Medical History  Diagnosis Date  . ETOH abuse   . Psoriasis   . Hypertension     Medications:  Anti-infectives   Start     Dose/Rate Route Frequency Ordered Stop   07/27/13 0645  piperacillin-tazobactam (ZOSYN) IVPB 3.375 g     3.375 g 12.5 mL/hr over 240 Minutes Intravenous 3 times per day 07/27/13 0640     07/27/13 0645  vancomycin (VANCOCIN) IVPB 1000 mg/200 mL premix     1,000 mg 200 mL/hr over 60 Minutes Intravenous 3 times per day 07/27/13 0640       Assessment: Patient with UTI and foley.    Goal of Therapy:  Vancomycin trough level 15-20 mcg/ml Zosyn based on renal function   Plan:  Measure antibiotic drug levels at steady state Follow up culture results Vancomycin 1gm iv q8hr Zosyn 3.375g IV Q8H infused over 4hrs.   Darlina Guys, Jacquenette Shone  [place] 07/27/2013,6:45 AM

## 2013-07-27 NOTE — ED Provider Notes (Signed)
Medical screening examination/treatment/procedure(s) were performed by non-physician practitioner and as supervising physician I was immediately available for consultation/collaboration.  Tannon Peerson R. Odelia Graciano, MD 07/27/13 1531 

## 2013-07-27 NOTE — Plan of Care (Cosign Needed)
RN paged- pt had been tapering CIWA but has re-developed symptoms c/w worsened ETOH withdrawal so will dc previous CIWA and and begin a new detox that includes both scheduled and prn Ativan dosing. If sx's do not improve will need to transfer to SDU since suspect may require Precedex. Drinks a minimum of 1/5 of liquor daily.  Sheralyn Boatman, ANP

## 2013-07-27 NOTE — Plan of Care (Cosign Needed)
RN paged- has once again spiked temp to 103 and now once again passing bloody clots- will begin empiric anbx tx with Zosyn and Vanco (suspect UTI related to recent foley trauma) until cx's are final. Suspect at this point would benefit from formal urology evaluation.  Junious Silk, ANP

## 2013-07-28 DIAGNOSIS — L409 Psoriasis, unspecified: Secondary | ICD-10-CM | POA: Diagnosis present

## 2013-07-28 DIAGNOSIS — R509 Fever, unspecified: Secondary | ICD-10-CM

## 2013-07-28 DIAGNOSIS — D62 Acute posthemorrhagic anemia: Secondary | ICD-10-CM | POA: Diagnosis not present

## 2013-07-28 DIAGNOSIS — R7881 Bacteremia: Secondary | ICD-10-CM | POA: Diagnosis not present

## 2013-07-28 DIAGNOSIS — E876 Hypokalemia: Secondary | ICD-10-CM

## 2013-07-28 DIAGNOSIS — R319 Hematuria, unspecified: Secondary | ICD-10-CM | POA: Diagnosis not present

## 2013-07-28 LAB — CBC
HCT: 25.3 % — ABNORMAL LOW (ref 39.0–52.0)
MCH: 31.6 pg (ref 26.0–34.0)
MCV: 93 fL (ref 78.0–100.0)
Platelets: 69 10*3/uL — ABNORMAL LOW (ref 150–400)
RBC: 2.72 MIL/uL — ABNORMAL LOW (ref 4.22–5.81)

## 2013-07-28 LAB — BASIC METABOLIC PANEL
CO2: 25 mEq/L (ref 19–32)
Calcium: 8.2 mg/dL — ABNORMAL LOW (ref 8.4–10.5)
Chloride: 103 mEq/L (ref 96–112)
Glucose, Bld: 103 mg/dL — ABNORMAL HIGH (ref 70–99)
Sodium: 134 mEq/L — ABNORMAL LOW (ref 135–145)

## 2013-07-28 LAB — URINE CULTURE

## 2013-07-28 MED ORDER — VANCOMYCIN HCL IN DEXTROSE 1-5 GM/200ML-% IV SOLN
1000.0000 mg | Freq: Three times a day (TID) | INTRAVENOUS | Status: DC
  Administered 2013-07-28 – 2013-07-29 (×4): 1000 mg via INTRAVENOUS
  Filled 2013-07-28 (×5): qty 200

## 2013-07-28 NOTE — Progress Notes (Signed)
Pt urinated clear yellow urine at his last void. No sign of blood. No clots. Pt stated that it did not hurt urinating.

## 2013-07-28 NOTE — Consult Note (Signed)
[facility]    Date of Admission:  07/23/2013           Day 3 vancomycin       Reason for Consult: Fever and positive blood culture    Referring Physician: Dr. Ted Mcalpine  Principal Problem:   Fever, unspecified Active Problems:   Positive blood culture   Thrombocytopenia, unspecified   Depression   Alcohol intoxication in active alcoholic, acute   Abnormal EKG   Tachyarrhythmia   UTI (urinary tract infection)   Acute blood loss anemia   Hematuria   Psoriasis   . escitalopram  20 mg Oral Daily  . folic acid  1 mg Oral Daily  . gabapentin  400 mg Oral TID  . LORazepam  0-4 mg Intravenous Q6H   Followed by  . [START ON 07/29/2013] LORazepam  0-4 mg Intravenous Q12H  . metoprolol tartrate  50 mg Oral BID  . multivitamin with minerals  1 tablet Oral Daily  . pantoprazole  40 mg Oral BID AC  . QUEtiapine  200 mg Oral QHS  . QUEtiapine  50 mg Oral BID  . sodium chloride  3 mL Intravenous Q12H  . thiamine  100 mg Oral Daily   Or  . thiamine  100 mg Intravenous Daily  . vancomycin  1,000 mg Intravenous Q8H    Recommendations: 1. Continue vancomycin pending final blood culture results  2. Screened for HIV and hepatitis C  Assessment: It is not entirely clear what caused his recent fevers. Certainly could be related to his traumatic Foley removal even though his urinalysis is surprisingly normal. The one positive blood culture could be true, transient bacteremia or could represent a skin contaminant. I will need to await final blood culture results before determining its significance. I agree with continuing vancomycin for now. Recommend screening for HIV infection and hepatitis C given his cytopenias and psoriasis.   HPI: Jeffrey Black is a 52 y.o. male was admitted on 9/6 after a fall. He had alcohol level of 376. He was afebrile on admission. He was given IV fluids and a Foley catheter was placed. He was started on alcoholic  all protocol. He pulled his inflated Foley catheter out 2 days after admission. He was able to void spontaneously but was passing clots. On 9/9 progress notes indicate that his alcohol withdrawal symptoms worsened and he began to spike fevers. Blood cultures obtained at that time are growing gram-positive cocci in clusters in one of 2 sets. Oddly enough, despite having grossly bloody urine his urinalysis showed only 0-2 red blood cells and 0-2 white blood cells. Urine culture grew to hemorrhoids. His chest x-ray was unremarkable. He was treated with broad empiric antibiotic therapy and is defervescing. He is feeling much better.   Review of Systems: Constitutional: positive for fatigue and fevers, negative for anorexia, chills, sweats and weight loss Eyes: negative Ears, nose, mouth, throat, and face: negative Respiratory: positive for chronic, unchanged cough Cardiovascular: positive for chest pain and tachycardia, negative for dyspnea, orthopnea and paroxysmal nocturnal dyspnea Gastrointestinal: negative Genitourinary:positive for dysuria and hematuria, negative for decreased stream, frequency, hesitancy and urinary incontinence Integument/breast: positive for psoriasis  Past Medical History  Diagnosis Date  . ETOH abuse   . Psoriasis   . Hypertension     History  Substance Use Topics  . Smoking status: Current Every Day Smoker -- 1.00 packs/day for 20 years    Types:  Cigarettes  . Smokeless tobacco: Never Used  . Alcohol Use: Yes     Comment: drinks 1/5  per day    Family History  Problem Relation Age of Onset  . Stroke Father   . Diabetes type II Mother   . Cancer - Colon Other    No Known Allergies  OBJECTIVE: Blood pressure 109/75, pulse 87, temperature 98.9 F (37.2 C), temperature source Oral, resp. rate 18, height 6' (1.829 m), weight 90.719 kg (200 lb), SpO2 95.00%. General: He is alert and comfortable sitting up in bed Skin: Scattered erythematous plaques Oral: No  oropharyngeal lesions Lymph nodes: No palpable nodes Lungs: Clear Cor: Reg S1 and S2 without murmurs Abdomen: soft and nontender Joints and extremities: No acute abnormalities Mood and affect: Normal  Lab Results  Component Value Date   WBC 5.7 07/28/2013   HGB 8.6* 07/28/2013   HCT 25.3* 07/28/2013   MCV 93.0 07/28/2013   PLT 69* 07/28/2013   BMET    Component Value Date/Time   NA 134* 07/28/2013 0420   K 3.3* 07/28/2013 0420   CL 103 07/28/2013 0420   CO2 25 07/28/2013 0420   GLUCOSE 103* 07/28/2013 0420   BUN 9 07/28/2013 0420   CREATININE 0.66 07/28/2013 0420   CALCIUM 8.2* 07/28/2013 0420   GFRNONAA >90 07/28/2013 0420   GFRAA >90 07/28/2013 0420   Lab Results  Component Value Date   ALT 31 07/24/2013   AST 53* 07/24/2013   ALKPHOS 61 07/24/2013   BILITOT 0.9 07/24/2013   Microbiology: Recent Results (from the past 240 hour(s))  URINE CULTURE     Status: None   Collection Time    07/26/13  9:47 PM      Result Value Range Status   Specimen Description URINE, CLEAN CATCH   Final   Special Requests NONE   Final   Culture  Setup Time     Final   Value: 07/27/2013 03:49     Performed at [facility] Count     Final   Value: >=100,000 COLONIES/ML     Performed at [facility]   Culture     Final   Value: DIPHTHEROIDS(CORYNEBACTERIUM SPECIES)     Note: Standardized susceptibility testing for this organism is not available.     Performed at [facility]   Report Status 07/28/2013 FINAL   Final  CULTURE, BLOOD (ROUTINE X 2)     Status: None   Collection Time    07/26/13 10:50 PM      Result Value Range Status   Specimen Description BLOOD RIGHT ARM   Final   Special Requests BOTTLES DRAWN AEROBIC AND ANAEROBIC    Final   Culture  Setup Time     Final   Value: 07/27/2013 04:10     Performed at [facility]   Culture     Final   Value: GRAM POSITIVE COCCI IN CLUSTERS     Note: Gram Stain Report Called to,Read Back By and Verified With:  ALLISON SETZER ON 07/27/2013 AT 10:20P BY WILEJ     Performed at [facility]   Report Status PENDING   Incomplete  CULTURE, BLOOD (ROUTINE X 2)     Status: None   Collection Time    07/26/13 10:55 PM      Result Value Range Status   Specimen Description BLOOD LEFT HAND   Final   Special Requests BOTTLES DRAWN AEROBIC AND ANAEROBIC    Final   Culture  Setup Time     Final   Value: 07/27/2013 04:09     Performed at [facility]   Culture     Final   Value:        BLOOD CULTURE RECEIVED NO GROWTH TO DATE CULTURE WILL BE HELD FOR 5 DAYS BEFORE ISSUING A FINAL NEGATIVE REPORT     Performed at [facility]   Report Status PENDING   Incomplete    Cliffton Asters, MD [facility] for Infectious Disease [facility] [phone] pager   [phone] cell 07/28/2013, 5:38 PM

## 2013-07-28 NOTE — Progress Notes (Signed)
[facility] PROGRESS NOTE  Jeffrey Black:[id number] DOB: 09-09-1961 DOA: 07/23/2013 PCP: No primary provider on file.  Assessment/Plan: Acute ETOH Intoxication with Withdrawals -Continue CIWA protocol. -Thiamine/folate  Fever/?UTI/GPC Bacteremia -Urine cx with diphteroids. -1/2 Blood cx with GPC in clusters (?contaminant). Have placed on vanc pending speciation. -Will DC rocephin for now. -ID consult requested. -Treat fever with tylenol/ibuprofen.  Hypokalemia -Replete PO. -Mag ok at 1.6.  Acidosis -Resolved.  Bipolar Disorder -Continue Seroquel.  Hematuria -Related to foley trauma. -No urinary retention. -Follow. -See no need for GU consult at present.  ABLA -2/2 hematuria. -Hb 8.6 from 11 on admission. -Transfusion threshold <7.0.  Thrombocytopenia -Chronic. -Presumed related to ETOH use.  Code Status: Full Code Family Communication: Patient only  Disposition Plan: Home when medically stable.   Consultants:  None   Antibiotics:  Rocephin  Subjective: No hematuria so far today.  Objective: Filed Vitals:   07/27/13 1600 07/27/13 2238 07/28/13 0548 07/28/13 1300  BP: 103/68 97/60 113/74 109/75  Pulse: 76  93 87  Temp:   100.1 F (37.8 C) 98.9 F (37.2 C)  TempSrc:   Oral Oral  Resp:   18   Height:      Weight:      SpO2:   91% 95%    Intake/Output Summary (Last 24 hours) at 07/28/13 1627 Last data filed at 07/28/13 1500  Gross per 24 hour  Intake   2020 ml  Output   1500 ml  Net    520 ml   Filed Weights   07/23/13 1430  Weight: 90.719 kg (200 lb)    Exam:   General:  AA Ox3, NAD  Cardiovascular: tachycardic, regular, no M/R/G  Respiratory: CTA B  Abdomen: S/NT/ND/+BS/no masses   Extremities: no C/C/E   Neurologic:  Non-focal.  Data Reviewed: Basic Metabolic Panel:  Recent Labs Lab 07/23/13 1155 07/24/13 0555 07/25/13 1050 07/26/13 0525 07/27/13 0350 07/28/13 0420  NA  --  142 138 137 133* 134*   K  --  3.5 3.3* 3.6 3.0* 3.3*  CL  --  108 104 101 99 103  CO2  --  24 26 28 28 25   GLUCOSE  --  82 105* 112* 98 103*  BUN  --  14 10 10 12 9   CREATININE  --  0.63 0.59 0.79 0.81 0.66  CALCIUM  --  8.5 8.9 8.8 8.2* 8.2*  MG 1.9 2.1  --   --   --  1.9   Liver Function Tests:  Recent Labs Lab 07/24/13 0555  AST 53*  ALT 31  ALKPHOS 61  BILITOT 0.9  PROT 5.4*  ALBUMIN 2.8*    Recent Labs Lab 07/23/13 1646  LIPASE 37   No results found for this basename: AMMONIA,  in the last 168 hours CBC:  Recent Labs Lab 07/23/13 0136  07/24/13 0555 07/26/13 0525 07/27/13 0350 07/27/13 1621 07/28/13 0420  WBC 15.3*  --  7.3 4.9 10.1 8.4 5.7  NEUTROABS 13.0*  --   --   --   --   --   --   HGB 14.5  < > 10.7* 11.0* 9.8* 9.3* 8.6*  HCT 42.5  < > 31.3* 32.8* 29.3* 27.7* 25.3*  MCV 92.2  --  92.3 93.2 93.3 93.0 93.0  PLT 211  --  116* 89* 69* 62* 69*  < > = values in this interval not displayed. Cardiac Enzymes:  Recent Labs Lab 07/23/13 1155 07/23/13 1750 07/23/13 2342 07/24/13 0555  CKTOTAL  --  2280*  --   --   CKMB  --  8.4*  --   --   TROPONINI <0.30 <0.30 <0.30 <0.30   BNP (last 3 results)  Recent Labs  11/08/12 0540  PROBNP 34.6   CBG: No results found for this basename: GLUCAP,  in the last 168 hours  Recent Results (from the past 240 hour(s))  URINE CULTURE     Status: None   Collection Time    07/26/13  9:47 PM      Result Value Range Status   Specimen Description URINE, CLEAN CATCH   Final   Special Requests NONE   Final   Culture  Setup Time     Final   Value: 07/27/2013 03:49     Performed at [facility] Count     Final   Value: >=100,000 COLONIES/ML     Performed at [facility]   Culture     Final   Value: DIPHTHEROIDS(CORYNEBACTERIUM SPECIES)     Note: Standardized susceptibility testing for this organism is not available.     Performed at [facility]   Report Status 07/28/2013 FINAL   Final  CULTURE,  BLOOD (ROUTINE X 2)     Status: None   Collection Time    07/26/13 10:50 PM      Result Value Range Status   Specimen Description BLOOD RIGHT ARM   Final   Special Requests BOTTLES DRAWN AEROBIC AND ANAEROBIC    Final   Culture  Setup Time     Final   Value: 07/27/2013 04:10     Performed at [facility]   Culture     Final   Value: GRAM POSITIVE COCCI IN CLUSTERS     Note: Gram Stain Report Called to,Read Back By and Verified With: ALLISON SETZER ON 07/27/2013 AT 10:20P BY WILEJ     Performed at [facility]   Report Status PENDING   Incomplete  CULTURE, BLOOD (ROUTINE X 2)     Status: None   Collection Time    07/26/13 10:55 PM      Result Value Range Status   Specimen Description BLOOD LEFT HAND   Final   Special Requests BOTTLES DRAWN AEROBIC AND ANAEROBIC    Final   Culture  Setup Time     Final   Value: 07/27/2013 04:09     Performed at [facility]   Culture     Final   Value:        BLOOD CULTURE RECEIVED NO GROWTH TO DATE CULTURE WILL BE HELD FOR 5 DAYS BEFORE ISSUING A FINAL NEGATIVE REPORT     Performed at [facility]   Report Status PENDING   Incomplete     Studies: Dg Chest Port 1 View  07/26/2013   *RADIOLOGY REPORT*  Clinical Data: Fever  PORTABLE CHEST - 1 VIEW  Comparison: July 23, 2013.  Findings: Cardiomediastinal silhouette appears normal.  No acute pulmonary disease is noted.  Bony thorax is intact.  No pleural effusion or pneumothorax is noted.  IMPRESSION: No acute cardiopulmonary abnormality seen.   Original Report Authenticated By: Lupita Raider.,  M.D.    Scheduled Meds: . cefTRIAXone (ROCEPHIN)  IV  1 g Intravenous Q24H  . escitalopram  20 mg Oral Daily  . folic acid  1 mg Oral Daily  . gabapentin  400 mg Oral TID  . LORazepam  0-4  mg Intravenous Q6H   Followed by  . [START ON 07/29/2013] LORazepam  0-4 mg Intravenous Q12H  . metoprolol tartrate  50 mg Oral BID  . multivitamin with minerals  1 tablet Oral  Daily  . pantoprazole  40 mg Oral BID AC  . QUEtiapine  200 mg Oral QHS  . QUEtiapine  50 mg Oral BID  . sodium chloride  3 mL Intravenous Q12H  . thiamine  100 mg Oral Daily   Or  . thiamine  100 mg Intravenous Daily  . vancomycin  1,000 mg Intravenous Q8H   Continuous Infusions: . sodium chloride 75 mL/hr at 07/28/13 0601    Active Problems:   Alcohol intoxication in active alcoholic, acute   Abnormal EKG   Tachyarrhythmia   Fever, unspecified   UTI (urinary tract infection)   Acute blood loss anemia   Hematuria    Time spent: 35 minutes.    Chaya Jan  [facility] Pager [phone]  If 7PM-7AM, please contact night-coverage at [email or web address], password [facility] 07/28/2013, 4:27 PM  LOS: 5 days

## 2013-07-28 NOTE — Progress Notes (Signed)
On call paged about blood culture results. No new orders. Setzer, Don Broach

## 2013-07-28 NOTE — Progress Notes (Signed)
ANTIBIOTIC CONSULT NOTE - INITIAL  Pharmacy Consult for vancomycin Indication: 1 out of 2 blood cultures with Gram positive cocci  No Known Allergies  Patient Measurements: Height: 6' (182.9 cm) Weight: 200 lb (90.719 kg) IBW/kg (Calculated) : 77.6   Vital Signs: Temp: 100.1 F (37.8 C) (09/11 0548) Temp src: Oral (09/11 0548) BP: 113/74 mmHg (09/11 0548) Pulse Rate: 93 (09/11 0548) Intake/Output from previous day: 09/10 0701 - 09/11 0700 In: 2125 [P.O.:600; I.V.:1525] Out: 2075 [Urine:2075] Intake/Output from this shift:    Labs:  Recent Labs  07/26/13 0525 07/27/13 0350 07/27/13 1621 07/28/13 0420  WBC 4.9 10.1 8.4 5.7  HGB 11.0* 9.8* 9.3* 8.6*  PLT 89* 69* 62* 69*  CREATININE 0.79 0.81  --  0.66   Estimated Creatinine Clearance: 118.6 ml/min (by C-G formula based on Cr of 0.66).   Microbiology: 9/9 blood:1/2 GPC in clusters 9/9 urine: Corynebacterium   Anti-infectives: 07/27/2013 >> vanc >> 9/10, **restart 9/11 >> 07/27/2013 >> zosyn >> 9/10 9/10 >> ceftriaxone >>   Assessment: 53 yoM admitted 9/6 with alcohol intoxication and chest pain. Pt self-removed foley on 9/9 while balloon was inflated and had associated hematuria. Patient has also had persistent fevers this admisson and has had to remain on the CIWA protocol due to tremors and hallucinations associated with EtOH withdrawal.  Pharmacy was originally consulted to dose vancomycin and Zosyn after traumatic foley removal. The patient received one dose of each 910 at approximately 0600. These antibiotics were this discontinued by MD and patient was started on ceftriaxone for presumed UTI. Pharmacy has now been re-consulted to dose vancomycin in the setting of 12 blood cultures with GPC in clusters.  WBC are WNL and have been so since 9/7  Patient has continued to spike fevers which is relieved with acetaminophen  Culture results as above  Renal function is stable with CrCl > 100 ml/min, UOP not  accurately charted but looks adequate  Goal of Therapy:  Vancomycin trough level 15-20 mcg/ml eradication of infection  Plan:  - vancomycin 1g IV q8h based on weight and renal function - continue ceftriaxone 1g IV q24h - follow-up culture results, renal function, clinical course, fever curve - obtain vancomycin trough at steady state with q8h dosing - follow-up antibiotic de-escalation and length of therapy  Thank you for the consult.  Tomi Bamberger, PharmD Clinical Pharmacist Pager: [phone] Pharmacy: [phone] 07/28/2013 9:43 AM

## 2013-07-28 NOTE — Progress Notes (Signed)
Pt is stilll urinated bright red blood. This evening when I emptied his urinal he has 400 ml of bright red blood with several clots. Will continue to monitor

## 2013-07-29 LAB — CBC
HCT: 25.9 % — ABNORMAL LOW (ref 39.0–52.0)
Hemoglobin: 8.6 g/dL — ABNORMAL LOW (ref 13.0–17.0)
MCH: 31.2 pg (ref 26.0–34.0)
MCHC: 33.2 g/dL (ref 30.0–36.0)
MCV: 93.8 fL (ref 78.0–100.0)
RBC: 2.76 MIL/uL — ABNORMAL LOW (ref 4.22–5.81)

## 2013-07-29 LAB — HIV ANTIBODY (ROUTINE TESTING W REFLEX): HIV: NONREACTIVE

## 2013-07-29 MED ORDER — VITAMINS A & D EX OINT
TOPICAL_OINTMENT | CUTANEOUS | Status: AC
  Administered 2013-07-29: 03:00:00
  Filled 2013-07-29: qty 5

## 2013-07-29 MED ORDER — POTASSIUM CHLORIDE CRYS ER 20 MEQ PO TBCR
40.0000 meq | EXTENDED_RELEASE_TABLET | Freq: Once | ORAL | Status: AC
  Administered 2013-07-29: 40 meq via ORAL
  Filled 2013-07-29: qty 2

## 2013-07-29 NOTE — Progress Notes (Signed)
[facility] PROGRESS NOTE  Jeffrey Black NWG:[id number] DOB: August 13, 1961 DOA: 07/23/2013 PCP: No primary provider on file.  Assessment/Plan: Acute ETOH Intoxication with Withdrawals -Continue CIWA protocol. -Thiamine/folate  Fever/?UTI/CNS Bacteremia -Urine cx with diphteroids. -1/2 Blood cx with CNS , likely a contaminant. -ID consult requested, appreciate Dr. Blair Dolphin assistance. -Seems to have defervesced. -Will observe off of vancomycin, and plan for DC home in am if no fever spikes.  Hypokalemia -Replete PO. -Mag ok at 1.6.  Acidosis -Resolved.  Bipolar Disorder -Continue Seroquel.  Hematuria -Related to foley trauma. -No urinary retention. -Follow. -See no need for GU consult at present. -Has had no hematuria today.  ABLA -2/2 hematuria. -Hb 8.6 from 11 on admission. -Transfusion threshold <7.0.  Thrombocytopenia -Chronic. -Presumed related to ETOH use.  Code Status: Full Code Family Communication: Patient only  Disposition Plan: Home when medically stable. Anticipate DC in 24 hours.   Consultants:  None   Antibiotics:  None  Subjective: No hematuria so far today. No fever.  Objective: Filed Vitals:   07/28/13 1300 07/28/13 2019 07/29/13 0633 07/29/13 1314  BP: 109/75 120/76 130/96 122/90  Pulse: 87 88 79 82  Temp: 98.9 F (37.2 C) 98.7 F (37.1 C) 98.4 F (36.9 C) 98 F (36.7 C)  TempSrc: Oral Oral Oral Oral  Resp:  16 14 16   Height:      Weight:      SpO2: 95% 93% 94% 97%    Intake/Output Summary (Last 24 hours) at 07/29/13 1534 Last data filed at 07/29/13 1100  Gross per 24 hour  Intake   2280 ml  Output    525 ml  Net   1755 ml   Filed Weights   07/23/13 1430  Weight: 90.719 kg (200 lb)    Exam:   General:  AA Ox3, NAD  Cardiovascular: tachycardic, regular, no M/R/G  Respiratory: CTA B  Abdomen: S/NT/ND/+BS/no masses   Extremities: no C/C/E   Neurologic:  Non-focal.  Data Reviewed: Basic  Metabolic Panel:  Recent Labs Lab 07/23/13 1155 07/24/13 0555 07/25/13 1050 07/26/13 0525 07/27/13 0350 07/28/13 0420  NA  --  142 138 137 133* 134*  K  --  3.5 3.3* 3.6 3.0* 3.3*  CL  --  108 104 101 99 103  CO2  --  24 26 28 28 25   GLUCOSE  --  82 105* 112* 98 103*  BUN  --  14 10 10 12 9   CREATININE  --  0.63 0.59 0.79 0.81 0.66  CALCIUM  --  8.5 8.9 8.8 8.2* 8.2*  MG 1.9 2.1  --   --   --  1.9   Liver Function Tests:  Recent Labs Lab 07/24/13 0555  AST 53*  ALT 31  ALKPHOS 61  BILITOT 0.9  PROT 5.4*  ALBUMIN 2.8*    Recent Labs Lab 07/23/13 1646  LIPASE 37   No results found for this basename: AMMONIA,  in the last 168 hours CBC:  Recent Labs Lab 07/23/13 0136  07/26/13 0525 07/27/13 0350 07/27/13 1621 07/28/13 0420 07/29/13 0419  WBC 15.3*  < > 4.9 10.1 8.4 5.7 5.3  NEUTROABS 13.0*  --   --   --   --   --   --   HGB 14.5  < > 11.0* 9.8* 9.3* 8.6* 8.6*  HCT 42.5  < > 32.8* 29.3* 27.7* 25.3* 25.9*  MCV 92.2  < > 93.2 93.3 93.0 93.0 93.8  PLT 211  < > 89* 69*  62* 69* 110*  < > = values in this interval not displayed. Cardiac Enzymes:  Recent Labs Lab 07/23/13 1155 07/23/13 1750 07/23/13 2342 07/24/13 0555  CKTOTAL  --  2280*  --   --   CKMB  --  8.4*  --   --   TROPONINI <0.30 <0.30 <0.30 <0.30   BNP (last 3 results)  Recent Labs  11/08/12 0540  PROBNP 34.6   CBG: No results found for this basename: GLUCAP,  in the last 168 hours  Recent Results (from the past 240 hour(s))  URINE CULTURE     Status: None   Collection Time    07/26/13  9:47 PM      Result Value Range Status   Specimen Description URINE, CLEAN CATCH   Final   Special Requests NONE   Final   Culture  Setup Time     Final   Value: 07/27/2013 03:49     Performed at [facility] Count     Final   Value: >=100,000 COLONIES/ML     Performed at [facility]   Culture     Final   Value: DIPHTHEROIDS(CORYNEBACTERIUM SPECIES)     Note:  Standardized susceptibility testing for this organism is not available.     Performed at [facility]   Report Status 07/28/2013 FINAL   Final  CULTURE, BLOOD (ROUTINE X 2)     Status: None   Collection Time    07/26/13 10:50 PM      Result Value Range Status   Specimen Description BLOOD RIGHT ARM   Final   Special Requests BOTTLES DRAWN AEROBIC AND ANAEROBIC    Final   Culture  Setup Time     Final   Value: 07/27/2013 04:10     Performed at [facility]   Culture     Final   Value: STAPHYLOCOCCUS SPECIES     Note: Gram Stain Report Called to,Read Back By and Verified With: ALLISON SETZER ON 07/27/2013 AT 10:20P BY WILEJ     Performed at [facility]   Report Status PENDING   Incomplete  CULTURE, BLOOD (ROUTINE X 2)     Status: None   Collection Time    07/26/13 10:55 PM      Result Value Range Status   Specimen Description BLOOD LEFT HAND   Final   Special Requests BOTTLES DRAWN AEROBIC AND ANAEROBIC    Final   Culture  Setup Time     Final   Value: 07/27/2013 04:09     Performed at [facility]   Culture     Final   Value:        BLOOD CULTURE RECEIVED NO GROWTH TO DATE CULTURE WILL BE HELD FOR 5 DAYS BEFORE ISSUING A FINAL NEGATIVE REPORT     Performed at [facility]   Report Status PENDING   Incomplete     Studies: No results found.  Scheduled Meds: . escitalopram  20 mg Oral Daily  . folic acid  1 mg Oral Daily  . gabapentin  400 mg Oral TID  . LORazepam  0-4 mg Intravenous Q12H  . metoprolol tartrate  50 mg Oral BID  . multivitamin with minerals  1 tablet Oral Daily  . pantoprazole  40 mg Oral BID AC  . QUEtiapine  200 mg Oral QHS  . QUEtiapine  50 mg Oral BID  . sodium chloride  3 mL Intravenous  Q12H  . thiamine  100 mg Oral Daily   Continuous Infusions: . sodium chloride 1,000 mL (07/28/13 1929)    Principal Problem:   Fever, unspecified Active Problems:   Thrombocytopenia, unspecified   Depression    Alcohol intoxication in active alcoholic, acute   Abnormal EKG   Tachyarrhythmia   UTI (urinary tract infection)   Acute blood loss anemia   Hematuria   Positive blood culture   Psoriasis    Time spent: 35 minutes.    Chaya Jan  [facility] Pager [phone]  If 7PM-7AM, please contact night-coverage at [email or web address], password [facility] 07/29/2013, 3:34 PM  LOS: 6 days

## 2013-07-29 NOTE — Progress Notes (Signed)
Patient ID: Jeffrey Black, male   DOB: [birth year], 52 y.o.   MRN: [id number]         [facility]    Date of Admission:  07/23/2013           Day 4 vancomycin  Principal Problem:   Fever, unspecified Active Problems:   Positive blood culture   Thrombocytopenia, unspecified   Depression   Alcohol intoxication in active alcoholic, acute   Abnormal EKG   Tachyarrhythmia   UTI (urinary tract infection)   Acute blood loss anemia   Hematuria   Psoriasis   . escitalopram  20 mg Oral Daily  . folic acid  1 mg Oral Daily  . gabapentin  400 mg Oral TID  . LORazepam  0-4 mg Intravenous Q12H  . metoprolol tartrate  50 mg Oral BID  . multivitamin with minerals  1 tablet Oral Daily  . pantoprazole  40 mg Oral BID AC  . QUEtiapine  200 mg Oral QHS  . QUEtiapine  50 mg Oral BID  . sodium chloride  3 mL Intravenous Q12H  . thiamine  100 mg Oral Daily   Or  . thiamine  100 mg Intravenous Daily  . vancomycin  1,000 mg Intravenous Q8H    Subjective: He is feeling better. He has not passed any more clots in his urine today.  Objective: Temp:  [98.4 F (36.9 C)-98.9 F (37.2 C)] 98.4 F (36.9 C) (09/12 0633) Pulse Rate:  [79-88] 79 (09/12 0633) Resp:  [14-16] 14 (09/12 0633) BP: (109-130)/(75-96) 130/96 mmHg (09/12 0633) SpO2:  [93 %-95 %] 94 % (09/12 1191)  General: He is slightly groggy but in no distress Skin: No rash Lungs: Clear Cor: Regular S1 and S2 no murmurs Abdomen: Soft and nontender  Lab Results Lab Results  Component Value Date   WBC 5.3 07/29/2013   HGB 8.6* 07/29/2013   HCT 25.9* 07/29/2013   MCV 93.8 07/29/2013   PLT 110* 07/29/2013    Lab Results  Component Value Date   CREATININE 0.66 07/28/2013   BUN 9 07/28/2013   NA 134* 07/28/2013   K 3.3* 07/28/2013   CL 103 07/28/2013   CO2 25 07/28/2013    Lab Results  Component Value Date   ALT 31 07/24/2013   AST 53* 07/24/2013   ALKPHOS 61 07/24/2013   BILITOT 0.9 07/24/2013        Microbiology: Recent Results (from the past 240 hour(s))  URINE CULTURE     Status: None   Collection Time    07/26/13  9:47 PM      Result Value Range Status   Specimen Description URINE, CLEAN CATCH   Final   Special Requests NONE   Final   Culture  Setup Time     Final   Value: 07/27/2013 03:49     Performed at [facility] Count     Final   Value: >=100,000 COLONIES/ML     Performed at [facility]   Culture     Final   Value: DIPHTHEROIDS(CORYNEBACTERIUM SPECIES)     Note: Standardized susceptibility testing for this organism is not available.     Performed at [facility]   Report Status 07/28/2013 FINAL   Final  CULTURE, BLOOD (ROUTINE X 2)     Status: None   Collection Time    07/26/13 10:50 PM      Result Value Range Status   Specimen Description  BLOOD RIGHT ARM   Final   Special Requests BOTTLES DRAWN AEROBIC AND ANAEROBIC    Final   Culture  Setup Time     Final   Value: 07/27/2013 04:10     Performed at [facility]   Culture     Final   Value: STAPHYLOCOCCUS SPECIES     Note: Gram Stain Report Called to,Read Back By and Verified With: ALLISON SETZER ON 07/27/2013 AT 10:20P BY WILEJ     Performed at [facility]   Report Status PENDING   Incomplete  CULTURE, BLOOD (ROUTINE X 2)     Status: None   Collection Time    07/26/13 10:55 PM      Result Value Range Status   Specimen Description BLOOD LEFT HAND   Final   Special Requests BOTTLES DRAWN AEROBIC AND ANAEROBIC    Final   Culture  Setup Time     Final   Value: 07/27/2013 04:09     Performed at [facility]   Culture     Final   Value:        BLOOD CULTURE RECEIVED NO GROWTH TO DATE CULTURE WILL BE HELD FOR 5 DAYS BEFORE ISSUING A FINAL NEGATIVE REPORT     Performed at [facility]   Report Status PENDING   Incomplete    Studies/Results: No results found.  Assessment: He has defervesced. The one positive blood cultures growing  coagulase-negative staph and I suspect it is an insignificant contaminant. His fever may have been due to to the urethral trauma and transient infection that was missed on cultures. Alcohol withdrawal may also have been a factor. I favor discontinuing vancomycin and observing off of antibiotics.  Plan: 1. Discontinue vancomycin and observe off of antibiotics 2. Please call Dr. Judyann Munson [phone]) for infectious disease questions this weekend  Cliffton Asters, MD [facility] [phone] pager   [phone] cell 07/29/2013, 10:52 AM

## 2013-07-30 LAB — CBC
HCT: 25.8 % — ABNORMAL LOW (ref 39.0–52.0)
Hemoglobin: 8.5 g/dL — ABNORMAL LOW (ref 13.0–17.0)
MCH: 30.9 pg (ref 26.0–34.0)
MCHC: 32.9 g/dL (ref 30.0–36.0)
MCV: 93.8 fL (ref 78.0–100.0)
RDW: 13.7 % (ref 11.5–15.5)

## 2013-07-30 LAB — CULTURE, BLOOD (ROUTINE X 2)

## 2013-07-30 LAB — BASIC METABOLIC PANEL
BUN: 8 mg/dL (ref 6–23)
CO2: 25 mEq/L (ref 19–32)
Chloride: 103 mEq/L (ref 96–112)
Creatinine, Ser: 0.83 mg/dL (ref 0.50–1.35)
Glucose, Bld: 86 mg/dL (ref 70–99)
Potassium: 3.7 mEq/L (ref 3.5–5.1)

## 2013-07-30 MED ORDER — OXYCODONE HCL 5 MG PO TABS: 5.0000 mg | ORAL_TABLET | ORAL | Status: DC | PRN

## 2013-07-30 MED ORDER — HYDRALAZINE HCL 20 MG/ML IJ SOLN
10.0000 mg | Freq: Once | INTRAMUSCULAR | Status: AC
  Administered 2013-07-30: 07:00:00 10 mg via INTRAVENOUS
  Filled 2013-07-30: qty 0.5
  Filled 2013-07-30: qty 1

## 2013-07-30 MED ORDER — THIAMINE HCL 100 MG PO TABS: 100.0000 mg | ORAL_TABLET | Freq: Every day | ORAL | Status: DC

## 2013-07-30 MED ORDER — FOLIC ACID 1 MG PO TABS: 1.0000 mg | ORAL_TABLET | Freq: Every day | ORAL | Status: DC

## 2013-07-30 MED ORDER — METOPROLOL TARTRATE 50 MG PO TABS: 50.0000 mg | ORAL_TABLET | Freq: Two times a day (BID) | ORAL | Status: DC

## 2013-07-30 NOTE — Discharge Summary (Signed)
Physician Discharge Summary  LAMONT TANT MWU:[id number] DOB: [birth year] DOA: 07/23/2013  PCP: No primary provider on file.  Admit date: 07/23/2013 Discharge date: 07/30/2013  Time spent: 45 minutes  Recommendations for Outpatient Follow-up:  -Will be discharged home today. -CM has provided information to patient on how to procure PCP follow up.   Discharge Diagnoses:  Principal Problem:   Fever, unspecified Active Problems:   Thrombocytopenia, unspecified   Depression   Alcohol intoxication in active alcoholic, acute   Abnormal EKG   Tachyarrhythmia   UTI (urinary tract infection)   Acute blood loss anemia   Hematuria   Positive blood culture   Psoriasis   Discharge Condition: Stable and improved.  Filed Weights   07/23/13 1430  Weight: 90.719 kg (200 lb)    History of present illness:  52 year old male with a history of recurrent alcohol abuse and intoxication as well as psoriasis presents after the patient was found wandering around without his pants on. The patient was intoxicated, and [employer] was called and the patient was brought to the emergency department. Apparently the patient had passed out and fallen on the stairs and hit his chest on some steps. The patient states that he had been drinking 2x half a gallon of 100 proof vodka and Everclear the night before admission. The patient states that he has been drinking three quarters of a gallon of EverClear alcohol on a daily basis. The patient has been staying at [facility]. The patient currently is awake and alert. He denies any other illegal drug use. He complains of chest discomfort that is sharp and stabbing ever since he fell. He believes that this is likely related to his alcohol use. He denies any fevers, chills, hematemesis, hematochezia, melena, dysuria, hematuria. He does have some epigastric abdominal pain. The patient was recently discharged from [facility] on 04/20/2013 after a recent stay for suicidal  ideation. In fact, the patient has been multiple visits to [facility] for alcohol detox. He has had numerous admissions for alcohol intoxication.  In the ED, the patient was noted to be tachycardic initially into 130s. On telemetry, the patient was noted to have a short run of tachycardia that appeared to be wide-complex. [facility] was called and have seen the patient. They wanted the patient to be admitted in light of the dysrhythmia and intoxication. The patient had been previously prescribed metoprolol tartrate, but he does not take the medications on regular basis. In ED, the patient was given 3 L normal saline. His heart rate did improve in to 110s. He remained hemodynamically stable. The patient was placed on CIWA protocol.   Hospital Course:   Acute ETOH Intoxication with Withdrawals  -Thiamine/folate  _has been at least 72 hours since last signs of withdrawal.  Fever/?UTI/CNS Bacteremia  -Urine cx with diphteroids.  -1/2 Blood cx with CNS , likely a contaminant.  -ID consult requested, appreciate Dr. Blair Dolphin assistance.  -Seems to have defervesced.  -No antibiotics recommended for DC.  Hypokalemia  -Repleted. -Mag ok at 1.6.   Acidosis  -Resolved.   Bipolar Disorder  -Continue Seroquel.   Hematuria  -Related to foley trauma.  -No urinary retention.  -Follow.  -See no need for GU consult at present.  -Has had no hematuria in 48 hours.  ABLA  -2/2 hematuria.  -Hb 8.6 from 11 on admission.  -Transfusion threshold <7.0.   Thrombocytopenia  -Chronic.  -Presumed related to ETOH use.   Procedures:  None   Consultations:  Cardiology  ID  Discharge Instructions  Discharge Orders   Future Orders Complete By Expires   Diet - low sodium heart healthy  As directed    Discontinue IV  As directed    Increase activity slowly  As directed        Medication List         escitalopram 20 MG tablet  Commonly known as:  LEXAPRO  Take 1 tablet (20 mg total) by mouth  daily.     folic acid 1 MG tablet  Commonly known as:  FOLVITE  Take 1 tablet (1 mg total) by mouth daily.     gabapentin 400 MG capsule  Commonly known as:  NEURONTIN  Take 1 capsule (400 mg total) by mouth 3 (three) times daily.     metoprolol 50 MG tablet  Commonly known as:  LOPRESSOR  Take 1 tablet (50 mg total) by mouth 2 (two) times daily.     multivitamin with minerals Tabs tablet  Take 1 tablet by mouth daily.     oxyCODONE 5 MG immediate release tablet  Commonly known as:  Oxy IR/ROXICODONE  Take 1-2 tablets (5-10 mg total) by mouth every 4 (four) hours as needed.     QUEtiapine 200 MG tablet  Commonly known as:  SEROQUEL  Take 1 tablet (200 mg total) by mouth at bedtime.     QUEtiapine 50 MG tablet  Commonly known as:  SEROQUEL  Take 1 tablet (50 mg total) by mouth 2 (two) times daily.     thiamine 100 MG tablet  Take 1 tablet (100 mg total) by mouth daily.       No Known Allergies    The results of significant diagnostics from this hospitalization (including imaging, microbiology, ancillary and laboratory) are listed below for reference.    Significant Diagnostic Studies: Dg Chest 2 View  07/23/2013   *RADIOLOGY REPORT*  Clinical Data: The patient fell down stairs.  Chest pain and labored breathing.  Intoxicated.  CHEST - 2 VIEW  Comparison: 07/15/2013  Findings: Shallow inspiration. The heart size and pulmonary vascularity are normal. The lungs appear clear and expanded without focal air space disease or consolidation. No blunting of the costophrenic angles.  No pneumothorax.  Mediastinal contours appear intact.  No significant changes since previous study.  IMPRESSION: No evidence of active pulmonary disease.   Original Report Authenticated By: Burman Nieves, M.D.   Dg Chest 2 View  07/15/2013   *RADIOLOGY REPORT*  Clinical Data: Chest pain  CHEST - 2 VIEW  Comparison: 07/14/2013  Findings: Heart size upper normal.  Mediastinal contours otherwise within  normal range.  Lungs remain clear.  No pleural effusion or pneumothorax.  No acute osseous finding.  IMPRESSION: No radiographic evidence of acute cardiopulmonary process.   Original Report Authenticated By: Jearld Lesch, M.D.   Dg Cervical Spine Complete  07/15/2013   *RADIOLOGY REPORT*  Clinical Data: Neck pain  CERVICAL SPINE - COMPLETE 4+ VIEW  Comparison: 04/29/2013 CT  Findings: Maintained vertebral body height and alignment.  Mild multilevel degenerative changes.  No displaced fracture or dislocation.  Patent neural foramen.  Maintained C1-2 articulation. No dens fracture.  Lung apices clear. Prevertebral soft tissues within normal limits.  IMPRESSION: Multilevel degenerative changes without acute osseous finding.   Original Report Authenticated By: Jearld Lesch, M.D.   Dg Pelvis Portable  07/14/2013   *RADIOLOGY REPORT*  Clinical Data: Trauma.  PORTABLE PELVIS  Comparison: 04/29/2013  Findings: No fracture.  The hip joints,  SI joints and symphysis pubis are normally spaced and aligned.  The soft tissues are unremarkable.  IMPRESSION: No fracture or dislocation.   Original Report Authenticated By: Amie Portland, M.D.   Dg Chest Port 1 View  07/26/2013   *RADIOLOGY REPORT*  Clinical Data: Fever  PORTABLE CHEST - 1 VIEW  Comparison: July 23, 2013.  Findings: Cardiomediastinal silhouette appears normal.  No acute pulmonary disease is noted.  Bony thorax is intact.  No pleural effusion or pneumothorax is noted.  IMPRESSION: No acute cardiopulmonary abnormality seen.   Original Report Authenticated By: Lupita Raider.,  M.D.   Dg Chest Portable 1 View  07/14/2013   *RADIOLOGY REPORT*  Clinical Data: Trauma.  PORTABLE CHEST - 1 VIEW  Comparison: 06/23/2013  Findings: The heart size and mediastinal contours are within normal limits. Lung volumes are low.  Both lungs are clear.  The visualized skeletal structures are unremarkable.  IMPRESSION: 1.  Low lung volumes. 2.  No acute cardiopulmonary  abnormalities.   Original Report Authenticated By: Signa Kell, M.D.    Microbiology: Recent Results (from the past 240 hour(s))  URINE CULTURE     Status: None   Collection Time    07/26/13  9:47 PM      Result Value Range Status   Specimen Description URINE, CLEAN CATCH   Final   Special Requests NONE   Final   Culture  Setup Time     Final   Value: 07/27/2013 03:49     Performed at [facility] Count     Final   Value: >=100,000 COLONIES/ML     Performed at [facility]   Culture     Final   Value: DIPHTHEROIDS(CORYNEBACTERIUM SPECIES)     Note: Standardized susceptibility testing for this organism is not available.     Performed at [facility]   Report Status 07/28/2013 FINAL   Final  CULTURE, BLOOD (ROUTINE X 2)     Status: None   Collection Time    07/26/13 10:50 PM      Result Value Range Status   Specimen Description BLOOD RIGHT ARM   Final   Special Requests BOTTLES DRAWN AEROBIC AND ANAEROBIC    Final   Culture  Setup Time     Final   Value: 07/27/2013 04:10     Performed at [facility]   Culture     Final   Value: STAPHYLOCOCCUS SPECIES (COAGULASE NEGATIVE)     Note: THE SIGNIFICANCE OF ISOLATING THIS ORGANISM FROM A SINGLE SET OF BLOOD CULTURES WHEN MULTIPLE SETS ARE DRAWN IS UNCERTAIN. PLEASE NOTIFY THE MICROBIOLOGY DEPARTMENT WITHIN ONE WEEK IF SPECIATION AND SENSITIVITIES ARE REQUIRED.     Note: Gram Stain Report Called to,Read Back By and Verified With: ALLISON SETZER ON 07/27/2013 AT 10:20P BY Serafina Mitchell     Performed at [facility]   Report Status 07/30/2013 FINAL   Final  CULTURE, BLOOD (ROUTINE X 2)     Status: None   Collection Time    07/26/13 10:55 PM      Result Value Range Status   Specimen Description BLOOD LEFT HAND   Final   Special Requests BOTTLES DRAWN AEROBIC AND ANAEROBIC    Final   Culture  Setup Time     Final   Value: 07/27/2013 04:09     Performed at [facility]    Culture     Final   Value:  BLOOD CULTURE RECEIVED NO GROWTH TO DATE CULTURE WILL BE HELD FOR 5 DAYS BEFORE ISSUING A FINAL NEGATIVE REPORT     Performed at [facility]   Report Status PENDING   Incomplete  MRSA PCR SCREENING     Status: None   Collection Time    07/30/13  4:50 AM      Result Value Range Status   MRSA by PCR NEGATIVE  NEGATIVE Final   Comment:            The GeneXpert MRSA Assay (FDA     approved for NASAL specimens     only), is one component of a     comprehensive MRSA colonization     surveillance program. It is not     intended to diagnose MRSA     infection nor to guide or     monitor treatment for     MRSA infections.     Labs: Basic Metabolic Panel:  Recent Labs Lab 07/23/13 1155  07/24/13 0555 07/25/13 1050 07/26/13 0525 07/27/13 0350 07/28/13 0420 07/30/13 0443  NA  --   < > 142 138 137 133* 134* 136  K  --   < > 3.5 3.3* 3.6 3.0* 3.3* 3.7  CL  --   < > 108 104 101 99 103 103  CO2  --   < > 24 26 28 28 25 25   GLUCOSE  --   < > 82 105* 112* 98 103* 86  BUN  --   < > 14 10 10 12 9 8   CREATININE  --   < > 0.63 0.59 0.79 0.81 0.66 0.83  CALCIUM  --   < > 8.5 8.9 8.8 8.2* 8.2* 8.9  MG 1.9  --  2.1  --   --   --  1.9  --   < > = values in this interval not displayed. Liver Function Tests:  Recent Labs Lab 07/24/13 0555  AST 53*  ALT 31  ALKPHOS 61  BILITOT 0.9  PROT 5.4*  ALBUMIN 2.8*    Recent Labs Lab 07/23/13 1646  LIPASE 37   No results found for this basename: AMMONIA,  in the last 168 hours CBC:  Recent Labs Lab 07/27/13 0350 07/27/13 1621 07/28/13 0420 07/29/13 0419 07/30/13 0443  WBC 10.1 8.4 5.7 5.3 6.4  HGB 9.8* 9.3* 8.6* 8.6* 8.5*  HCT 29.3* 27.7* 25.3* 25.9* 25.8*  MCV 93.3 93.0 93.0 93.8 93.8  PLT 69* 62* 69* 110* 162   Cardiac Enzymes:  Recent Labs Lab 07/23/13 1155 07/23/13 1750 07/23/13 2342 07/24/13 0555  CKTOTAL  --  2280*  --   --   CKMB  --  8.4*  --   --   TROPONINI <0.30  <0.30 <0.30 <0.30   BNP: BNP (last 3 results)  Recent Labs  11/08/12 0540  PROBNP 34.6   CBG: No results found for this basename: GLUCAP,  in the last 168 hours     Signed:  Chaya Jan  [facility] Pager: [phone] 07/30/2013, 9:49 AM

## 2013-07-30 NOTE — Progress Notes (Signed)
   CARE MANAGEMENT NOTE 07/30/2013  Patient:  Jeffrey Black, Jeffrey Black   Account Number:  [id number]  Date Initiated:  07/24/2013  Documentation initiated by:  [facility]  Subjective/Objective Assessment:   ETOH abuse, falls     Action/Plan:   lives at [facility], multiple admission in recent months   Anticipated DC Date:  07/29/2013   Anticipated DC Plan:  HOME/SELF CARE  In-house referral  Clinical Social Worker      DC Planning Services  CM consult  Medication Assistance      Choice offered to / List presented to:             Status of service:  Completed, signed off Medicare Important Message given?   (If response is "NO", the following Medicare IM given date fields will be blank) Date Medicare IM given:   Date Additional Medicare IM given:    Discharge Disposition:  HOME/SELF CARE  Per UR Regulation:  Reviewed for med. necessity/level of care/duration of stay  If discussed at Long Length of Stay Meetings, dates discussed:    Comments:  07/30/2013 1035 NCM spoke to pt and states he is going to stay with his father until he gets back on his feet. His coverage ends on 08/16/2013. He is paying $700 a month for his insurance coverage. He currently looking for another job. Provided pt with information on [facility] to arrange follow up with PCP. Isidoro Donning RN CCM Case Mgmt phone [phone]  07/27/13 KATHY MAHABIR RN,BSN NCM [phone] CIWA PROTOCAL,FEVER-IV ABX.D/C PLAN-EXTENDED STAY HOTEL.HAS SCRIPT COVERAGE.NO ANTICIPATED D/C NEEDS.  07/26/13 KATHY MAHABIR RN,BSN NCM [phone] NO ANTICIPATED D/C NEEDS.  07/25/13 KATHY MAHABIR RN,BSN NCM [phone] PATIENT HAS SCRIPT COVERAGE ACTIVE SINCE 7/1.  07/24/2013 1300 NCM spoke to pt and states his insurance will end on Wednesday. States he gets his meds from [facility]. States he has not been to [employer]. His last job was in January. Pt was lethargic from medications to continue NCM consult. NCM will  continue to follow up on meds assistance. Will do a benefit check on insurance and medication coverage. Will have weekday NCM follow up with [facility] on meds and scheduling an appt. Glyn Ade RN CCM Case Mgmt phone [phone]  07/24/2013 1111 Referral to CSW for follow up on living situation and assistance with drug rehab programs. Isidoro Donning RN CCM Case Mgmt phone [phone]

## 2013-07-30 NOTE — Progress Notes (Signed)
Pt elevated BP of 165/100, notified NP on call. One time order of hydralazine 10 mg IV ordered. Pt tolerated well. Will continue to monitor.

## 2013-08-01 ENCOUNTER — Emergency Department (HOSPITAL_COMMUNITY)
Admission: EM | Admit: 2013-08-01 | Discharge: 2013-08-03 | Disposition: A | Discharge status: Other Acute Inpatient Hospital | Payer: PRIVATE HEALTH INSURANCE | Attending: Emergency Medicine | Admitting: Emergency Medicine

## 2013-08-01 ENCOUNTER — Emergency Department (HOSPITAL_COMMUNITY): Payer: PRIVATE HEALTH INSURANCE

## 2013-08-01 ENCOUNTER — Encounter (HOSPITAL_COMMUNITY): Payer: Self-pay | Admitting: Emergency Medicine

## 2013-08-01 DIAGNOSIS — F1010 Alcohol abuse, uncomplicated: Secondary | ICD-10-CM | POA: Insufficient documentation

## 2013-08-01 DIAGNOSIS — I10 Essential (primary) hypertension: Secondary | ICD-10-CM | POA: Insufficient documentation

## 2013-08-01 DIAGNOSIS — IMO0002 Reserved for concepts with insufficient information to code with codable children: Secondary | ICD-10-CM | POA: Insufficient documentation

## 2013-08-01 DIAGNOSIS — F17200 Nicotine dependence, unspecified, uncomplicated: Secondary | ICD-10-CM | POA: Insufficient documentation

## 2013-08-01 DIAGNOSIS — Z79899 Other long term (current) drug therapy: Secondary | ICD-10-CM | POA: Insufficient documentation

## 2013-08-01 DIAGNOSIS — Z872 Personal history of diseases of the skin and subcutaneous tissue: Secondary | ICD-10-CM | POA: Insufficient documentation

## 2013-08-01 LAB — CBC WITH DIFFERENTIAL/PLATELET
Basophils Absolute: 0.1 10*3/uL (ref 0.0–0.1)
Eosinophils Absolute: 0.1 10*3/uL (ref 0.0–0.7)
Eosinophils Relative: 1 % (ref 0–5)
HCT: 31.6 % — ABNORMAL LOW (ref 39.0–52.0)
Lymphocytes Relative: 24 % (ref 12–46)
Lymphs Abs: 2.1 10*3/uL (ref 0.7–4.0)
MCH: 30.6 pg (ref 26.0–34.0)
MCV: 92.9 fL (ref 78.0–100.0)
Monocytes Absolute: 0.6 10*3/uL (ref 0.1–1.0)
Platelets: 388 10*3/uL (ref 150–400)
RDW: 14.1 % (ref 11.5–15.5)
WBC: 8.8 10*3/uL (ref 4.0–10.5)

## 2013-08-01 LAB — RAPID URINE DRUG SCREEN, HOSP PERFORMED
Benzodiazepines: POSITIVE — AB
Cocaine: NOT DETECTED
Opiates: NOT DETECTED

## 2013-08-01 LAB — COMPREHENSIVE METABOLIC PANEL
CO2: 24 mEq/L (ref 19–32)
Calcium: 8.5 mg/dL (ref 8.4–10.5)
Creatinine, Ser: 0.71 mg/dL (ref 0.50–1.35)
GFR calc Af Amer: >90 mL/min (ref >90)
GFR calc non Af Amer: >90 mL/min (ref >90)
Glucose, Bld: 98 mg/dL (ref 70–99)
Sodium: 146 mEq/L — ABNORMAL HIGH (ref 135–145)
Total Protein: 7 g/dL (ref 6.0–8.3)

## 2013-08-01 LAB — URINALYSIS, ROUTINE W REFLEX MICROSCOPIC
Leukocytes, UA: NEGATIVE
Nitrite: NEGATIVE
Specific Gravity, Urine: 1.017 (ref 1.005–1.030)
Urobilinogen, UA: 0.2 mg/dL (ref 0.0–1.0)
pH: 7 (ref 5.0–8.0)

## 2013-08-01 LAB — POCT I-STAT TROPONIN I: Troponin i, poc: 0 ng/mL (ref 0.00–0.08)

## 2013-08-01 MED ORDER — THIAMINE HCL 100 MG/ML IJ SOLN: 100.0000 mg | Freq: Once | INTRAMUSCULAR | Status: DC

## 2013-08-01 MED ORDER — LORAZEPAM 1 MG PO TABS: 0.0000 mg | ORAL_TABLET | Freq: Two times a day (BID) | ORAL | Status: DC

## 2013-08-01 MED ORDER — FOLIC ACID 1 MG PO TABS
1.0000 mg | ORAL_TABLET | Freq: Once | ORAL | Status: DC
  Filled 2013-08-01: qty 1

## 2013-08-01 MED ORDER — ADULT MULTIVITAMIN W/MINERALS CH: 1.0000 | ORAL_TABLET | Freq: Once | ORAL | Status: DC

## 2013-08-01 MED ORDER — ADULT MULTIVITAMIN W/MINERALS CH
1.0000 | ORAL_TABLET | Freq: Every day | ORAL | Status: DC
  Administered 2013-08-01 – 2013-08-03 (×3): 1 via ORAL
  Filled 2013-08-01 (×3): qty 1

## 2013-08-01 MED ORDER — LORAZEPAM 2 MG/ML IJ SOLN: 1.0000 mg | Freq: Four times a day (QID) | INTRAMUSCULAR | Status: DC | PRN

## 2013-08-01 MED ORDER — LORAZEPAM 1 MG PO TABS
0.0000 mg | ORAL_TABLET | Freq: Four times a day (QID) | ORAL | Status: DC
  Administered 2013-08-01: 2 mg via ORAL
  Administered 2013-08-02: 4 mg via ORAL
  Administered 2013-08-02: 2 mg via ORAL
  Filled 2013-08-01: qty 2
  Filled 2013-08-01: qty 4
  Filled 2013-08-01: qty 2

## 2013-08-01 MED ORDER — LORAZEPAM 2 MG/ML IJ SOLN: 0.0000 mg | Freq: Four times a day (QID) | INTRAMUSCULAR | Status: DC

## 2013-08-01 MED ORDER — VITAMIN B-1 100 MG PO TABS
100.0000 mg | ORAL_TABLET | Freq: Every day | ORAL | Status: DC
  Administered 2013-08-01 – 2013-08-03 (×3): 100 mg via ORAL
  Filled 2013-08-01 (×3): qty 1

## 2013-08-01 MED ORDER — FOLIC ACID 1 MG PO TABS
1.0000 mg | ORAL_TABLET | Freq: Every day | ORAL | Status: DC
  Administered 2013-08-01 – 2013-08-03 (×3): 1 mg via ORAL
  Filled 2013-08-01 (×3): qty 1

## 2013-08-01 MED ORDER — LORAZEPAM 1 MG PO TABS: 1.0000 mg | ORAL_TABLET | Freq: Four times a day (QID) | ORAL | Status: DC | PRN

## 2013-08-01 MED ORDER — LORAZEPAM 2 MG/ML IJ SOLN: 0.0000 mg | Freq: Two times a day (BID) | INTRAMUSCULAR | Status: DC

## 2013-08-01 MED ORDER — LORAZEPAM 1 MG PO TABS
1.0000 mg | ORAL_TABLET | Freq: Four times a day (QID) | ORAL | Status: DC | PRN
  Administered 2013-08-01 – 2013-08-02 (×2): 1 mg via ORAL
  Filled 2013-08-01 (×2): qty 1

## 2013-08-01 MED ORDER — THIAMINE HCL 100 MG/ML IJ SOLN: 100.0000 mg | Freq: Every day | INTRAMUSCULAR | Status: DC

## 2013-08-01 NOTE — ED Notes (Signed)
Bed: ZO10 Expected date:  Expected time:  Means of arrival:  Comments: EMS - [facility]MS, [employer] with EMS **ROOM 14**

## 2013-08-01 NOTE — ED Notes (Signed)
Pt confused, but cooperative out of handcuffs. [employer] left and pt decided to take nap

## 2013-08-01 NOTE — ED Provider Notes (Signed)
CSN: [id number]     Arrival date & time 08/01/13  1559 History   First MD Initiated Contact with Patient 08/01/13 1600     Chief Complaint  Patient presents with  . Altered Mental Status   (Consider location/radiation/quality/duration/timing/severity/associated sxs/prior Treatment) HPI Comments: Patient found in his hotel room where he lives he did, exhibiting confusion and agitation. EMS and police were called to the scene and have transported the patient to the ER. Patient is handcuffed having been given Versed and Haldol in route for agitation. Arrival, patient is still agitated, not answering any questions. Level V Caveat due to mental status changes.  Patient is a 52 y.o. male presenting with altered mental status.  Altered Mental Status   Past Medical History  Diagnosis Date  . ETOH abuse   . Psoriasis   . Hypertension    Past Surgical History  Procedure Laterality Date  . Left 4th finger surgery    . Esophagogastroduodenoscopy N/A 05/03/2013    Procedure: ESOPHAGOGASTRODUODENOSCOPY (EGD);  Surgeon: Meryl Dare, MD;  Location: [facility];  Service: Endoscopy;  Laterality: N/A;   Family History  Problem Relation Age of Onset  . Stroke Father   . Diabetes type II Mother   . Cancer - Colon Other    History  Substance Use Topics  . Smoking status: Current Every Day Smoker -- 1.00 packs/day for 20 years    Types: Cigarettes  . Smokeless tobacco: Never Used  . Alcohol Use: Yes     Comment: drinks 1/5  per day    Review of Systems  Unable to perform ROS: Mental status change    Allergies  Review of patient's allergies indicates no known allergies.  Home Medications   Current Outpatient Rx  Name  Route  Sig  Dispense  Refill  . escitalopram (LEXAPRO) 20 MG tablet   Oral   Take 1 tablet (20 mg total) by mouth daily.   30 tablet   0   . folic acid (FOLVITE) 1 MG tablet   Oral   Take 1 tablet (1 mg total) by mouth daily.         Marland Kitchen gabapentin (NEURONTIN)  400 MG capsule   Oral   Take 1 capsule (400 mg total) by mouth 3 (three) times daily.   120 capsule   0   . metoprolol (LOPRESSOR) 50 MG tablet   Oral   Take 1 tablet (50 mg total) by mouth 2 (two) times daily.   60 tablet   2   . Multiple Vitamin (MULTIVITAMIN WITH MINERALS) TABS tablet   Oral   Take 1 tablet by mouth daily.           Nutritional supplement.   Marland Kitchen oxyCODONE (OXY IR/ROXICODONE) 5 MG immediate release tablet   Oral   Take 1-2 tablets (5-10 mg total) by mouth every 4 (four) hours as needed.   15 tablet   0   . QUEtiapine (SEROQUEL) 200 MG tablet   Oral   Take 1 tablet (200 mg total) by mouth at bedtime.   30 tablet   0   . QUEtiapine (SEROQUEL) 50 MG tablet   Oral   Take 1 tablet (50 mg total) by mouth 2 (two) times daily.   60 tablet   0   . thiamine 100 MG tablet   Oral   Take 1 tablet (100 mg total) by mouth daily.          BP 109/51  Pulse 80  Temp(Src) 97.5 F (36.4 C) (Axillary)  Resp 15  SpO2 93% Physical Exam  Constitutional: He appears well-developed and well-nourished. No distress.  HENT:  Head: Normocephalic and atraumatic.  Right Ear: Hearing normal.  Left Ear: Hearing normal.  Nose: Nose normal.  Mouth/Throat: Oropharynx is clear and moist and mucous membranes are normal.  Eyes: Conjunctivae and EOM are normal. Pupils are equal, round, and reactive to light.  Neck: Normal range of motion. Neck supple.  Cardiovascular: Regular rhythm, S1 normal and S2 normal.  Exam reveals no gallop and no friction rub.   No murmur heard. Pulmonary/Chest: Effort normal and breath sounds normal. No respiratory distress. He exhibits no tenderness.  Abdominal: Soft. Normal appearance and bowel sounds are normal. There is no hepatosplenomegaly. There is no tenderness. There is no rebound, no guarding, no tenderness at McBurney's point and negative Murphy's sign. No hernia.  Musculoskeletal: Normal range of motion.  Neurological: He is alert. He  has normal strength. No cranial nerve deficit or sensory deficit. Coordination normal.  Skin: Skin is warm, dry and intact. No rash noted. No cyanosis.  Psychiatric: Thought content normal. He is agitated. He is noncommunicative.    ED Course  Procedures (including critical care time) Labs Review Labs Reviewed  CBC WITH DIFFERENTIAL - Abnormal; Notable for the following:    RBC 3.40 (*)    Hemoglobin 10.4 (*)    HCT 31.6 (*)    All other components within normal limits  COMPREHENSIVE METABOLIC PANEL - Abnormal; Notable for the following:    Sodium 146 (*)    Albumin 3.2 (*)    Total Bilirubin 0.2 (*)    All other components within normal limits  URINE RAPID DRUG SCREEN (HOSP PERFORMED) - Abnormal; Notable for the following:    Benzodiazepines POSITIVE (*)    All other components within normal limits  ETHANOL - Abnormal; Notable for the following:    Alcohol, Ethyl (B) 363 (*)    All other components within normal limits  SALICYLATE LEVEL - Abnormal; Notable for the following:    Salicylate Lvl <2.0 (*)    All other components within normal limits  URINALYSIS, ROUTINE W REFLEX MICROSCOPIC  ACETAMINOPHEN LEVEL  POCT I-STAT TROPONIN I   Imaging Review Ct Head Wo Contrast  08/01/2013   *RADIOLOGY REPORT*  Clinical Data: Mental status changes.  Hallucinations.  Alcohol use.  CT HEAD WITHOUT CONTRAST  Technique:  Contiguous axial images were obtained from the base of the skull through the vertex without contrast.  Comparison: 06/23/2013  Findings: No mass effect, midline shift, or acute intracranial hemorrhage.  Visualized paranasal sinuses and mastoid air cells are clear.  Intact cranium.  Minimal chronic ischemic changes in the periventricular white matter.  IMPRESSION: No acute intracranial pathology.   Original Report Authenticated By: Jolaine Click, M.D.   Dg Chest Port 1 View  08/01/2013   CLINICAL DATA:  Mid chest pain  EXAM: PORTABLE CHEST - 1 VIEW  COMPARISON:  07/26/2013   FINDINGS: Mild cardiomegaly, stable from priors. No change in upper mediastinal contours.  Small (2 cm) nodular density at the left base, new from prior. No edema, effusion, or pneumothorax.  IMPRESSION: 1. No acute abnormality detected. 2. New small nodular opacity at the left base. In the absence of infectious history, this is most likely a small focus of atelectasis or pneumonitis.   Electronically Signed   By: Tiburcio Pea   On: 08/01/2013 22:20    MDM  Diagnosis: Alcohol intoxication; possible  pneumonia  Patient presents to the ER for altered mental status. Patient is obviously intoxicated. He has a history of alcohol abuse. Initial workup was unremarkable. As the patient became more awake and alert he was complaining of pain in the center of his chest secondary to fall. Head CT had already been performed, was negative. Chest x-ray was performed, was negative. EKG and troponin were also added on without any abnormalities. Patient will be allowed to continue to Cipro. The ER, likely discharge in the morning.    Gilda Crease, MD 08/02/13 [phone]

## 2013-08-01 NOTE — ED Notes (Signed)
Belongings placed in locker #32 

## 2013-08-01 NOTE — Progress Notes (Signed)
Discharge summary sent to payer through [facility]  

## 2013-08-01 NOTE — ED Notes (Signed)
Pt sts he is not able to urinate at this time for a urine sample. Staff provided pt with some fluids.

## 2013-08-01 NOTE — ED Notes (Signed)
Pt found in hotel room naked and with altered mental status/hallucinating. Pt became aggressive upon EMS and [employer] arrival. Pt given 5mg  haldol IM and 2.5 versed IV. Etoh found in hotel room, not other drugs found. [employer] at bedside, pt handcuffed to stretcher.

## 2013-08-02 ENCOUNTER — Other Ambulatory Visit: Payer: Self-pay

## 2013-08-02 ENCOUNTER — Encounter (HOSPITAL_COMMUNITY): Payer: Self-pay | Admitting: Registered Nurse

## 2013-08-02 DIAGNOSIS — F1020 Alcohol dependence, uncomplicated: Secondary | ICD-10-CM

## 2013-08-02 LAB — CULTURE, BLOOD (ROUTINE X 2): Culture: NO GROWTH

## 2013-08-02 MED ORDER — HYDROXYZINE HCL 25 MG PO TABS
25.0000 mg | ORAL_TABLET | Freq: Four times a day (QID) | ORAL | Status: DC | PRN
  Administered 2013-08-02: 25 mg via ORAL
  Filled 2013-08-02: qty 1

## 2013-08-02 MED ORDER — LORAZEPAM 1 MG PO TABS
1.0000 mg | ORAL_TABLET | Freq: Once | ORAL | Status: AC
  Administered 2013-08-02: 1 mg via ORAL
  Filled 2013-08-02: qty 1

## 2013-08-02 MED ORDER — LORAZEPAM 2 MG/ML IJ SOLN
2.0000 mg | Freq: Once | INTRAMUSCULAR | Status: AC
  Administered 2013-08-02: 2 mg via INTRAVENOUS
  Filled 2013-08-02: qty 1

## 2013-08-02 MED ORDER — NAPROXEN 500 MG PO TABS
500.0000 mg | ORAL_TABLET | Freq: Once | ORAL | Status: AC
  Administered 2013-08-02: 500 mg via ORAL
  Filled 2013-08-02: qty 1

## 2013-08-02 MED ORDER — QUETIAPINE FUMARATE 100 MG PO TABS
200.0000 mg | ORAL_TABLET | Freq: Every day | ORAL | Status: DC
  Administered 2013-08-02: 200 mg via ORAL
  Filled 2013-08-02: qty 2

## 2013-08-02 MED ORDER — CHLORDIAZEPOXIDE HCL 25 MG PO CAPS
25.0000 mg | ORAL_CAPSULE | Freq: Four times a day (QID) | ORAL | Status: DC | PRN
  Administered 2013-08-02: 25 mg via ORAL
  Filled 2013-08-02 (×2): qty 1

## 2013-08-02 MED ORDER — ESCITALOPRAM OXALATE 10 MG PO TABS
20.0000 mg | ORAL_TABLET | Freq: Every day | ORAL | Status: DC
  Administered 2013-08-02 – 2013-08-03 (×2): 20 mg via ORAL
  Filled 2013-08-02: qty 2

## 2013-08-02 MED ORDER — CHLORDIAZEPOXIDE HCL 25 MG PO CAPS: 25.0000 mg | ORAL_CAPSULE | Freq: Every day | ORAL | Status: DC

## 2013-08-02 MED ORDER — KETOROLAC TROMETHAMINE 30 MG/ML IJ SOLN
30.0000 mg | Freq: Once | INTRAMUSCULAR | Status: AC
  Administered 2013-08-02: 30 mg via INTRAVENOUS
  Filled 2013-08-02: qty 1

## 2013-08-02 MED ORDER — CHLORDIAZEPOXIDE HCL 25 MG PO CAPS
25.0000 mg | ORAL_CAPSULE | Freq: Four times a day (QID) | ORAL | Status: DC
  Administered 2013-08-02 – 2013-08-03 (×4): 25 mg via ORAL
  Filled 2013-08-02 (×3): qty 1

## 2013-08-02 MED ORDER — CHLORDIAZEPOXIDE HCL 25 MG PO CAPS: 25.0000 mg | ORAL_CAPSULE | Freq: Three times a day (TID) | ORAL | Status: DC

## 2013-08-02 MED ORDER — GABAPENTIN 400 MG PO CAPS
400.0000 mg | ORAL_CAPSULE | Freq: Three times a day (TID) | ORAL | Status: DC
  Administered 2013-08-02: 400 mg via ORAL
  Filled 2013-08-02 (×5): qty 1

## 2013-08-02 MED ORDER — ONDANSETRON 4 MG PO TBDP: 4.0000 mg | ORAL_TABLET | Freq: Four times a day (QID) | ORAL | Status: DC | PRN

## 2013-08-02 MED ORDER — CHLORDIAZEPOXIDE HCL 25 MG PO CAPS
25.0000 mg | ORAL_CAPSULE | Freq: Once | ORAL | Status: AC
  Administered 2013-08-02: 25 mg via ORAL
  Filled 2013-08-02: qty 1

## 2013-08-02 MED ORDER — CHLORDIAZEPOXIDE HCL 25 MG PO CAPS: 25.0000 mg | ORAL_CAPSULE | ORAL | Status: DC

## 2013-08-02 MED ORDER — ACETAMINOPHEN 325 MG PO TABS
650.0000 mg | ORAL_TABLET | Freq: Four times a day (QID) | ORAL | Status: DC | PRN
  Administered 2013-08-02 (×2): 650 mg via ORAL
  Filled 2013-08-02 (×2): qty 2

## 2013-08-02 MED ORDER — NICOTINE 21 MG/24HR TD PT24
21.0000 mg | MEDICATED_PATCH | Freq: Every day | TRANSDERMAL | Status: DC
  Administered 2013-08-02: 21 mg via TRANSDERMAL
  Filled 2013-08-02: qty 1

## 2013-08-02 MED ORDER — METOPROLOL TARTRATE 25 MG PO TABS
50.0000 mg | ORAL_TABLET | Freq: Two times a day (BID) | ORAL | Status: DC
  Administered 2013-08-02 – 2013-08-03 (×2): 50 mg via ORAL
  Filled 2013-08-02 (×2): qty 2

## 2013-08-02 MED ORDER — ADULT MULTIVITAMIN W/MINERALS CH
1.0000 | ORAL_TABLET | Freq: Every day | ORAL | Status: DC
  Administered 2013-08-02: 1 via ORAL
  Filled 2013-08-02: qty 1

## 2013-08-02 MED ORDER — LOPERAMIDE HCL 2 MG PO CAPS: 2.0000 mg | ORAL_CAPSULE | ORAL | Status: DC | PRN

## 2013-08-02 NOTE — ED Notes (Signed)
md informed of pt's b/p and hr and that pt is requesting ativan now. md stated to give 2 am dose now.

## 2013-08-02 NOTE — ED Provider Notes (Signed)
8:12 AM Patient waking up.  He now adds that he fell prior to presentation yesterday and has CP / soreness.  ECG and toradol ordered.  TTS is aware of the patient.  EKG has sinus tachycardia, 110, otherwise unremarkable  Gerhard Munch, MD 08/02/13 1555

## 2013-08-02 NOTE — Consult Note (Signed)
[facility] Face-to-Face Psychiatry Consult   Reason for Consult:  Drinking again Referring Physician:  ER MD  Jeffrey Black is an 52 y.o. male.  Assessment: AXIS I:  alcohol dependence AXIS II:  Deferred AXIS III:   Past Medical History  Diagnosis Date  . ETOH abuse   . Psoriasis   . Hypertension    AXIS IV:  economic problems, housing problems, occupational problems and problems related to social environment AXIS V:  51-60 moderate symptoms  Plan:  needs detox again  Subjective:   Jeffrey Black is a 52 y.o. male patient admitted with alcohol intoxication.  HPI:  Jeffrey Black says he was sober for 3 weeks and then started drinking again.  He drinks a fifth a day.  He has been detoxed many times before as well as nine rehabs, he says.  There last detox was at [facility] in August.  Not suicidal but remains depressed. HPI Elements:   Location:  ER. Quality:  chronic addiction. Severity:  moderate. Timing:  no precipitants, just relapsed. Duration:  years. Context:  chronic addiction.  Past Psychiatric History: Past Medical History  Diagnosis Date  . ETOH abuse   . Psoriasis   . Hypertension     reports that he has been smoking Cigarettes.  He has a 20 pack-year smoking history. He has never used smokeless tobacco. He reports that  drinks alcohol. He reports that he does not use illicit drugs. Family History  Problem Relation Age of Onset  . Stroke Father   . Diabetes type II Mother   . Cancer - Colon Other            Allergies:  No Known Allergies  ACT Assessment Complete:  Yes:    Educational Status    Risk to Self: Risk to self Substance abuse history and/or treatment for substance abuse?: Yes  Risk to Others:    Abuse:    Prior Inpatient Therapy:    Prior Outpatient Therapy:    Additional Information:                    Objective: Blood pressure 151/98, pulse 119, temperature 99.3 F (37.4 C), temperature source Oral, resp. rate 20, SpO2  95.00%.There is no weight on file to calculate BMI. Results for orders placed during the hospital encounter of 08/01/13 (from the past 72 hour(s))  CBC WITH DIFFERENTIAL     Status: Abnormal   Collection Time    08/01/13  4:50 PM      Result Value Range   WBC 8.8  4.0 - 10.5 K/uL   RBC 3.40 (*) 4.22 - 5.81 MIL/uL   Hemoglobin 10.4 (*) 13.0 - 17.0 g/dL   HCT 16.1 (*) 09.6 - 04.5 %   MCV 92.9  78.0 - 100.0 fL   MCH 30.6  26.0 - 34.0 pg   MCHC 32.9  30.0 - 36.0 g/dL   RDW 40.9  81.1 - 91.4 %   Platelets 388  150 - 400 K/uL   Neutrophils Relative % 68  43 - 77 %   Neutro Abs 6.0  1.7 - 7.7 K/uL   Lymphocytes Relative 24  12 - 46 %   Lymphs Abs 2.1  0.7 - 4.0 K/uL   Monocytes Relative 6  3 - 12 %   Monocytes Absolute 0.6  0.1 - 1.0 K/uL   Eosinophils Relative 1  0 - 5 %   Eosinophils Absolute 0.1  0.0 - 0.7 K/uL  Basophils Relative 1  0 - 1 %   Basophils Absolute 0.1  0.0 - 0.1 K/uL  COMPREHENSIVE METABOLIC PANEL     Status: Abnormal   Collection Time    08/01/13  4:50 PM      Result Value Range   Sodium 146 (*) 135 - 145 mEq/L   Potassium 3.7  3.5 - 5.1 mEq/L   Chloride 108  96 - 112 mEq/L   CO2 24  19 - 32 mEq/L   Glucose, Bld 98  70 - 99 mg/dL   BUN 7  6 - 23 mg/dL   Creatinine, Ser 1.61  0.50 - 1.35 mg/dL   Calcium 8.5  8.4 - 09.6 mg/dL   Total Protein 7.0  6.0 - 8.3 g/dL   Albumin 3.2 (*) 3.5 - 5.2 g/dL   AST 27  0 - 37 U/L   ALT 43  0 - 53 U/L   Alkaline Phosphatase 80  39 - 117 U/L   Total Bilirubin 0.2 (*) 0.3 - 1.2 mg/dL   GFR calc non Af Amer >90  >90 mL/min   GFR calc Af Amer >90  >90 mL/min   Comment: (NOTE)     The eGFR has been calculated using the CKD EPI equation.     This calculation has not been validated in all clinical situations.     eGFR's persistently <90 mL/min signify possible Chronic Kidney     Disease.  ETHANOL     Status: Abnormal   Collection Time    08/01/13  4:50 PM      Result Value Range   Alcohol, Ethyl (B) 363 (*) 0 - 11 mg/dL    Comment:            LOWEST DETECTABLE LIMIT FOR     SERUM ALCOHOL IS 11 mg/dL     FOR MEDICAL PURPOSES ONLY  ACETAMINOPHEN LEVEL     Status: None   Collection Time    08/01/13  4:50 PM      Result Value Range   Acetaminophen (Tylenol), Serum <15.0  10 - 30 ug/mL   Comment:            THERAPEUTIC CONCENTRATIONS VARY     SIGNIFICANTLY. A RANGE OF 10-30     ug/mL MAY BE AN EFFECTIVE     CONCENTRATION FOR MANY PATIENTS.     HOWEVER, SOME ARE BEST TREATED     AT CONCENTRATIONS OUTSIDE THIS     RANGE.     ACETAMINOPHEN CONCENTRATIONS     >150 ug/mL AT 4 HOURS AFTER     INGESTION AND >50 ug/mL AT 12     HOURS AFTER INGESTION ARE     OFTEN ASSOCIATED WITH TOXIC     REACTIONS.  SALICYLATE LEVEL     Status: Abnormal   Collection Time    08/01/13  4:50 PM      Result Value Range   Salicylate Lvl <2.0 (*) 2.8 - 20.0 mg/dL  URINALYSIS, ROUTINE W REFLEX MICROSCOPIC     Status: None   Collection Time    08/01/13  8:47 PM      Result Value Range   Color, Urine YELLOW  YELLOW   APPearance CLEAR  CLEAR   Specific Gravity, Urine 1.017  1.005 - 1.030   pH 7.0  5.0 - 8.0   Glucose, UA NEGATIVE  NEGATIVE mg/dL   Hgb urine dipstick NEGATIVE  NEGATIVE   Bilirubin Urine NEGATIVE  NEGATIVE   Ketones, ur  NEGATIVE  NEGATIVE mg/dL   Protein, ur NEGATIVE  NEGATIVE mg/dL   Urobilinogen, UA 0.2  0.0 - 1.0 mg/dL   Nitrite NEGATIVE  NEGATIVE   Leukocytes, UA NEGATIVE  NEGATIVE   Comment: MICROSCOPIC NOT DONE ON URINES WITH NEGATIVE PROTEIN, BLOOD, LEUKOCYTES, NITRITE, OR GLUCOSE <1000 mg/dL.  URINE RAPID DRUG SCREEN (HOSP PERFORMED)     Status: Abnormal   Collection Time    08/01/13  8:47 PM      Result Value Range   Opiates NONE DETECTED  NONE DETECTED   Cocaine NONE DETECTED  NONE DETECTED   Benzodiazepines POSITIVE (*) NONE DETECTED   Amphetamines NONE DETECTED  NONE DETECTED   Tetrahydrocannabinol NONE DETECTED  NONE DETECTED   Barbiturates NONE DETECTED  NONE DETECTED   Comment:             DRUG SCREEN FOR MEDICAL PURPOSES     ONLY.  IF CONFIRMATION IS NEEDED     FOR ANY PURPOSE, NOTIFY LAB     WITHIN 5 DAYS.                LOWEST DETECTABLE LIMITS     FOR URINE DRUG SCREEN     Drug Class       Cutoff (ng/mL)     Amphetamine      1000     Barbiturate      200     Benzodiazepine   200     Tricyclics       300     Opiates          300     Cocaine          300     THC              50  POCT I-STAT TROPONIN I     Status: None   Collection Time    08/01/13 10:28 PM      Result Value Range   Troponin i, poc 0.00  0.00 - 0.08 ng/mL   Comment 3            Comment: Due to the release kinetics of cTnI,     a negative result within the first hours     of the onset of symptoms does not rule out     myocardial infarction with certainty.     If myocardial infarction is still suspected,     repeat the test at appropriate intervals.   Labs are reviewed and are pertinent for alcohol intoxication.  Current Facility-Administered Medications  Medication Dose Route Frequency Provider Last Rate Last Dose  . folic acid (FOLVITE) tablet 1 mg  1 mg Oral Daily Gilda Crease, MD   1 mg at 08/01/13 1857  . folic acid (FOLVITE) tablet 1 mg  1 mg Oral Once Gilda Crease, MD      . LORazepam (ATIVAN) tablet 1 mg  1 mg Oral Q6H PRN Gilda Crease, MD   1 mg at 08/02/13 0040   Or  . LORazepam (ATIVAN) injection 1 mg  1 mg Intravenous Q6H PRN Gilda Crease, MD      . LORazepam (ATIVAN) tablet 1 mg  1 mg Oral Q6H PRN Gilda Crease, MD       Or  . LORazepam (ATIVAN) injection 1 mg  1 mg Intravenous Q6H PRN Gilda Crease, MD      . LORazepam (ATIVAN) tablet 0-4 mg  0-4 mg Oral Q6H Christopher  J. Pollina, MD   2 mg at 08/02/13 0440   Followed by  . [START ON 08/03/2013] LORazepam (ATIVAN) tablet 0-4 mg  0-4 mg Oral Q12H Gilda Crease, MD      . multivitamin with minerals tablet 1 tablet  1 tablet Oral Daily Gilda Crease, MD   1  tablet at 08/01/13 1857  . thiamine (VITAMIN B-1) tablet 100 mg  100 mg Oral Daily Gilda Crease, MD   100 mg at 08/01/13 1857   Or  . thiamine (B-1) injection 100 mg  100 mg Intravenous Daily Gilda Crease, MD       Current Outpatient Prescriptions  Medication Sig Dispense Refill  . escitalopram (LEXAPRO) 20 MG tablet Take 1 tablet (20 mg total) by mouth daily.  30 tablet  0  . gabapentin (NEURONTIN) 400 MG capsule Take 1 capsule (400 mg total) by mouth 3 (three) times daily.  120 capsule  0  . ibuprofen (ADVIL,MOTRIN) 200 MG tablet Take 200 mg by mouth every 6 (six) hours as needed for pain.      . metoprolol (LOPRESSOR) 50 MG tablet Take 1 tablet (50 mg total) by mouth 2 (two) times daily.  60 tablet  2  . Multiple Vitamin (MULTIVITAMIN WITH MINERALS) TABS tablet Take 1 tablet by mouth daily.      . naproxen sodium (ANAPROX) 220 MG tablet Take 220 mg by mouth 2 (two) times daily with a meal.      . QUEtiapine (SEROQUEL) 200 MG tablet Take 1 tablet (200 mg total) by mouth at bedtime.  30 tablet  0  . QUEtiapine (SEROQUEL) 50 MG tablet Take 1 tablet (50 mg total) by mouth 2 (two) times daily.  60 tablet  0  . oxyCODONE (OXY IR/ROXICODONE) 5 MG immediate release tablet Take 1-2 tablets (5-10 mg total) by mouth every 4 (four) hours as needed.  15 tablet  0    Psychiatric Specialty Exam:     Blood pressure 151/98, pulse 119, temperature 99.3 F (37.4 C), temperature source Oral, resp. rate 20, SpO2 95.00%.There is no weight on file to calculate BMI.  General Appearance: Casual  Eye Contact::  Good  Speech:  Clear and Coherent and Normal Rate  Volume:  Normal  Mood:  Depressed  Affect:  Appropriate  Thought Process:  Negative  Orientation:  Full (Time, Place, and Person)  Thought Content:  Negative  Suicidal Thoughts:  No  Homicidal Thoughts:  No  Memory:  Immediate;   Good Recent;   Good Remote;   Good  Judgement:  Intact  Insight:  Fair  Psychomotor Activity:   Normal  Concentration:  Good  Recall:  Good  Akathisia:  Negative  Handed:  Right  AIMS (if indicated):     Assets:  Communication Skills  Sleep:      Treatment Plan Summary: Daily contact with patient to assess and evaluate symptoms and progress in treatment Medication management needs detox again at [facility] if possible and if not then whatever facility can be found  Adalaya Irion D 08/02/2013 9:49 AM

## 2013-08-02 NOTE — Progress Notes (Signed)
Noted med[employer] coverage and no pcp Pt confirms no pcp and not sure if he still has [employer] coverage but aware of how to get pcp if needed

## 2013-08-02 NOTE — ED Notes (Signed)
Pt belongings removed from locker 32 and were given to the psych ED RN.

## 2013-08-03 ENCOUNTER — Encounter (HOSPITAL_COMMUNITY): Payer: Self-pay | Admitting: *Deleted

## 2013-08-03 ENCOUNTER — Inpatient Hospital Stay (HOSPITAL_COMMUNITY)
Admission: AD | Admit: 2013-08-03 | Discharge: 2013-08-09 | DRG: 897 | Disposition: A | Discharge status: Home / Self Care | Payer: PRIVATE HEALTH INSURANCE | Source: Intra-hospital | Attending: Psychiatry | Admitting: Psychiatry

## 2013-08-03 DIAGNOSIS — Z79899 Other long term (current) drug therapy: Secondary | ICD-10-CM

## 2013-08-03 DIAGNOSIS — I10 Essential (primary) hypertension: Secondary | ICD-10-CM | POA: Diagnosis present

## 2013-08-03 DIAGNOSIS — F329 Major depressive disorder, single episode, unspecified: Secondary | ICD-10-CM | POA: Diagnosis present

## 2013-08-03 DIAGNOSIS — F19988 Other psychoactive substance use, unspecified with other psychoactive substance-induced disorder: Secondary | ICD-10-CM

## 2013-08-03 DIAGNOSIS — F32A Depression, unspecified: Secondary | ICD-10-CM | POA: Diagnosis present

## 2013-08-03 DIAGNOSIS — F10239 Alcohol dependence with withdrawal, unspecified: Principal | ICD-10-CM | POA: Diagnosis present

## 2013-08-03 DIAGNOSIS — F1020 Alcohol dependence, uncomplicated: Secondary | ICD-10-CM | POA: Diagnosis present

## 2013-08-03 DIAGNOSIS — F321 Major depressive disorder, single episode, moderate: Secondary | ICD-10-CM

## 2013-08-03 DIAGNOSIS — F10939 Alcohol use, unspecified with withdrawal, unspecified: Principal | ICD-10-CM | POA: Diagnosis present

## 2013-08-03 DIAGNOSIS — IMO0001 Reserved for inherently not codable concepts without codable children: Secondary | ICD-10-CM | POA: Diagnosis present

## 2013-08-03 MED ORDER — ONDANSETRON 4 MG PO TBDP: 4.0000 mg | ORAL_TABLET | Freq: Four times a day (QID) | ORAL | Status: AC | PRN

## 2013-08-03 MED ORDER — HYDROXYZINE HCL 25 MG PO TABS: 25.0000 mg | ORAL_TABLET | Freq: Four times a day (QID) | ORAL | Status: DC | PRN

## 2013-08-03 MED ORDER — QUETIAPINE FUMARATE 200 MG PO TABS
200.0000 mg | ORAL_TABLET | Freq: Every day | ORAL | Status: DC
  Administered 2013-08-03 – 2013-08-08 (×6): 200 mg via ORAL
  Filled 2013-08-03: qty 1
  Filled 2013-08-03: qty 4
  Filled 2013-08-03 (×6): qty 1

## 2013-08-03 MED ORDER — ACETAMINOPHEN 325 MG PO TABS
650.0000 mg | ORAL_TABLET | Freq: Four times a day (QID) | ORAL | Status: DC | PRN
  Administered 2013-08-07: 650 mg via ORAL

## 2013-08-03 MED ORDER — OXYCODONE HCL 5 MG PO TABS
5.0000 mg | ORAL_TABLET | ORAL | Status: DC | PRN
  Administered 2013-08-03 – 2013-08-08 (×6): 5 mg via ORAL
  Filled 2013-08-03 (×6): qty 1

## 2013-08-03 MED ORDER — GABAPENTIN 400 MG PO CAPS
400.0000 mg | ORAL_CAPSULE | Freq: Three times a day (TID) | ORAL | Status: DC
  Administered 2013-08-03 – 2013-08-09 (×19): 400 mg via ORAL
  Filled 2013-08-03: qty 1
  Filled 2013-08-03: qty 12
  Filled 2013-08-03 (×4): qty 1
  Filled 2013-08-03: qty 12
  Filled 2013-08-03: qty 1
  Filled 2013-08-03: qty 12
  Filled 2013-08-03 (×17): qty 1

## 2013-08-03 MED ORDER — VITAMIN B-1 100 MG PO TABS
100.0000 mg | ORAL_TABLET | Freq: Every day | ORAL | Status: DC
  Administered 2013-08-04 – 2013-08-09 (×6): 100 mg via ORAL
  Filled 2013-08-03 (×8): qty 1

## 2013-08-03 MED ORDER — QUETIAPINE FUMARATE 50 MG PO TABS
50.0000 mg | ORAL_TABLET | Freq: Two times a day (BID) | ORAL | Status: DC
  Administered 2013-08-03 – 2013-08-09 (×12): 50 mg via ORAL
  Filled 2013-08-03: qty 8
  Filled 2013-08-03 (×9): qty 1
  Filled 2013-08-03: qty 8
  Filled 2013-08-03 (×5): qty 1

## 2013-08-03 MED ORDER — METOPROLOL TARTRATE 50 MG PO TABS
50.0000 mg | ORAL_TABLET | Freq: Two times a day (BID) | ORAL | Status: DC
  Administered 2013-08-03 – 2013-08-09 (×12): 50 mg via ORAL
  Filled 2013-08-03 (×7): qty 1
  Filled 2013-08-03: qty 8
  Filled 2013-08-03 (×6): qty 1
  Filled 2013-08-03: qty 8
  Filled 2013-08-03: qty 1

## 2013-08-03 MED ORDER — NAPROXEN 250 MG PO TABS
250.0000 mg | ORAL_TABLET | Freq: Two times a day (BID) | ORAL | Status: DC
  Administered 2013-08-03 – 2013-08-09 (×12): 250 mg via ORAL
  Filled 2013-08-03 (×16): qty 1

## 2013-08-03 MED ORDER — MAGNESIUM HYDROXIDE 400 MG/5ML PO SUSP: 30.0000 mL | Freq: Every day | ORAL | Status: DC | PRN

## 2013-08-03 MED ORDER — CHLORDIAZEPOXIDE HCL 25 MG PO CAPS
25.0000 mg | ORAL_CAPSULE | ORAL | Status: AC
  Administered 2013-08-05 (×2): 25 mg via ORAL
  Filled 2013-08-03 (×2): qty 1

## 2013-08-03 MED ORDER — CHLORDIAZEPOXIDE HCL 25 MG PO CAPS
25.0000 mg | ORAL_CAPSULE | Freq: Four times a day (QID) | ORAL | Status: AC
  Administered 2013-08-03 (×3): 25 mg via ORAL
  Filled 2013-08-03 (×3): qty 1

## 2013-08-03 MED ORDER — LOPERAMIDE HCL 2 MG PO CAPS: 2.0000 mg | ORAL_CAPSULE | ORAL | Status: DC | PRN

## 2013-08-03 MED ORDER — ESCITALOPRAM OXALATE 20 MG PO TABS
20.0000 mg | ORAL_TABLET | Freq: Every day | ORAL | Status: DC
  Administered 2013-08-03 – 2013-08-09 (×7): 20 mg via ORAL
  Filled 2013-08-03 (×2): qty 1
  Filled 2013-08-03: qty 4
  Filled 2013-08-03 (×7): qty 1

## 2013-08-03 MED ORDER — CHLORDIAZEPOXIDE HCL 25 MG PO CAPS
25.0000 mg | ORAL_CAPSULE | Freq: Every day | ORAL | Status: AC
  Administered 2013-08-06: 25 mg via ORAL
  Filled 2013-08-03: qty 1

## 2013-08-03 MED ORDER — CHLORDIAZEPOXIDE HCL 25 MG PO CAPS
25.0000 mg | ORAL_CAPSULE | Freq: Three times a day (TID) | ORAL | Status: AC
  Administered 2013-08-04 (×3): 25 mg via ORAL
  Filled 2013-08-03 (×3): qty 1

## 2013-08-03 MED ORDER — ADULT MULTIVITAMIN W/MINERALS CH
1.0000 | ORAL_TABLET | Freq: Every day | ORAL | Status: DC
  Administered 2013-08-03 – 2013-08-09 (×7): 1 via ORAL
  Filled 2013-08-03 (×6): qty 1
  Filled 2013-08-03: qty 4
  Filled 2013-08-03 (×2): qty 1

## 2013-08-03 MED ORDER — ALUM & MAG HYDROXIDE-SIMETH 200-200-20 MG/5ML PO SUSP
30.0000 mL | ORAL | Status: DC | PRN
  Administered 2013-08-04 – 2013-08-07 (×3): 30 mL via ORAL

## 2013-08-03 MED ORDER — CHLORDIAZEPOXIDE HCL 25 MG PO CAPS: 25.0000 mg | ORAL_CAPSULE | Freq: Four times a day (QID) | ORAL | Status: DC | PRN

## 2013-08-03 NOTE — ED Notes (Signed)
[place], [place] report that patient has been accepted to [facility] by Dr,

## 2013-08-03 NOTE — BHH Counselor (Signed)
Adult Psychosocial Assessment Update Interdisciplinary Team  Previous [facility] admissions/discharges:  Admissions Discharges  Date: 08/03/13 Date: unknown at this time.   Date: 06/25/13 Date: 06/29/13  Date: 04/17/13 Date: 04/20/13  Date: Date:  Date: Date:   Changes since the last Psychosocial Assessment (including adherence to outpatient mental health and/or substance abuse treatment, situational issues contributing to decompensation and/or relapse). 52 year old male with a history of recurrent alcohol abuse and intoxication as well as psoriasis presents after the patient was found wandering around without his pants on. The patient was intoxicated, and [employer] was called and the patient was brought to the emergency department. Apparently the patient had passed out and fallen on the stairs and hit his chest on some steps. The patient states that he had been drinking 2x half a gallon of 100 proof vodka and Everclear the night before admission. The patient states that he has been drinking three quarters of a gallon of EverClear alcohol on a daily basis. The patient has been staying at [facility]. The patient was recently discharged from [facility] on 04/20/2013 after a recent stay for suicidal ideation. In fact, the patient has been multiple visits to [facility] for alcohol detox. He has had numerous admissions for alcohol intoxication.             Discharge Plan 1. Will you be returning to the same living situation after discharge?   Yes: No:      If no, what is your plan?    Pt placement currently unknown. CSW assessing for appropriate placement if necessary.        2. Would you like a referral for services when you are discharged? Yes:     If yes, for what services?  No:       CSW currently assessing for appropriate referrals. Pt had been set up with [facility] for followup during last admission in Aug 2014. He also attends AA regularly and has a sponsor who is not aware of pt's  recent admission to [facility].        Summary and Recommendations (to be completed by the evaluator) Patient is a 52 yo male admitted to [facility] for alcohol detox. Patient states he binge drinks, sometimes drinking more than a 1/5 day. Patient had a period of sobriety for eight years ending in 2003. These past 3 weeks he has been drinking continuously. ETOH level in ED was 363. His last detox was here at [facility] in July 2014. When he was discharged in July, his follow up was with [facility]. Patient did not follow up at that time. He has also been to [facility] and [facility]. He c/o sternum pain from a previous fall while intoxicated. Xrays and EKG were negative. Patient reports decreased concentration, decreased appetite, anxiety and depression. He feels hopeless. 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 Patient denies any SI/HI/AVH. He does report one prior suicide attempt.   Recommendations for pt include: crisis stabilization, therapeutic milieu, encourage group attendance and participation, librium taper for withdrawals, medication management for mood stabilization, and development of comprehensive mental wellness/sobriety plan.                      Signature:  Trula Slade, 08/03/2013 12:38 PM

## 2013-08-03 NOTE — BHH Counselor (Signed)
Writer informed the nurse with the patient that he has been accepted to [facility].  Writer informed the nurse to fax the support paperwork.

## 2013-08-03 NOTE — H&P (Signed)
Psychiatric Admission Assessment Adult  Patient Identification:  Jeffrey Black  Date of Evaluation:  08/03/2013  Chief Complaint:  ETOH DEPENDENCY  History of Present Illness: This is an admission assessment for this 52 year old Caucasian male. Admitted to [facility] from the [facility] with complaints of alcohol intoxication/withrawal symptoms requesting detox treatment. Patient reports, "The police escorted me to the hospital 2 days ago. I was living in an extended stay motel. It was 2 days ago when the manager of the motel wanted me to pay for the week on that day. I wanted to pay later because I was too drunk to do anything concrete at the time. I wanted all the alcohol in me to come down a little so that I can get myself together. The manager did not want to wait. She wanted the money right then. When I did not do as she had hoped, she got mad and called the cops.The cops came, realized that I was wasted decided to take me to the hospital instead. I have been drinking heavily and daily x 6 weeks. I drink 2 (half gallons) of ever clear and 3-4 bottles of vodka in 3 days. I'm absolutely an alcoholic since 1991. Now, I have been sober for 8 years and 3 months from 1995 -2003.  Alcohol gives me euphoria, it relieves my anxiety and loneliness. It has also gotten me into a big problem with the law. I have a pending DUI with careless and reckless driving charge 1 month ago. I also lost my driver's license as well".  Elements:  Location:  [facility] adult unit. Quality:  Tremors, high anxiety, depression, Insomnia. Severity:  Severe. Timing:  "Started 6 weeks ago'. Duration:  "I'm a chronic alcoholic". Context:  Staying in a extended motel, 2 days ago, the manager wanted me to pay for the week, I was drunk, was not in my right mind, told him I will pay later, he got mad, called the cops. The cops came, saw me and escorted me to the hospital..  Associated Signs/Synptoms:  Depression Symptoms:   depressed mood, anhedonia, insomnia, feelings of worthlessness/guilt, hopelessness, anxiety, insomnia, loss of energy/fatigue,  (Hypo) Manic Symptoms:  Impulsivity, Irritable Mood,  Anxiety Symptoms:  Excessive Worry, Panic Symptoms,  Psychotic Symptoms:  Hallucinations: None  PTSD Symptoms: Had a traumatic exposure:  None reported  Psychiatric Specialty Exam: Physical Exam  Constitutional: He is oriented to person, place, and time. He appears well-developed.  HENT:  Head: Normocephalic.  Eyes: Pupils are equal, round, and reactive to light.  Neck: Normal range of motion.  Cardiovascular: Normal rate.   Respiratory: Effort normal.  GI: Soft.  Musculoskeletal: Normal range of motion.  Neurological: He is alert and oriented to person, place, and time.  Skin: Skin is warm and dry. Rash noted. There is erythema. There is pallor.  Has severe dry skin areas (Scally) most areas of the body. Dx of Psoriasis.    Psychiatric: His speech is normal and behavior is normal. Thought content normal. His mood appears anxious (Rated at #9). His affect is not angry, not blunt, not labile and not inappropriate. Cognition and memory are impaired (Thought the present month is August). He expresses impulsivity. He exhibits a depressed mood (Rated at #10). He exhibits abnormal recent memory.    Review of Systems  Constitutional: Positive for malaise/fatigue.  HENT: Negative.   Eyes: Negative.   Respiratory: Negative.   Cardiovascular: Negative.   Gastrointestinal: Negative.   Genitourinary: Negative.  Musculoskeletal: Positive for myalgias.  Skin: Negative for rash.       Has severe dry skin areas (Scally) most areas of the body. Dx of Psoriasis  Neurological: Positive for tremors and weakness.  Endo/Heme/Allergies: Negative.   Psychiatric/Behavioral: Positive for depression (Rated at #9) and substance abuse (Chronic alcoholism). Negative for suicidal ideas, hallucinations and memory  loss. The patient is nervous/anxious (Rated at #9) and has insomnia.     Blood pressure 119/84, pulse 97, temperature 99 F (37.2 C), temperature source Oral, resp. rate 18, height 6' (1.829 m), weight 92.08 kg (203 lb).Body mass index is 27.53 kg/(m^2).  General Appearance: Disheveled and Psoraisis eruptions  Eye Contact::  Good  Speech:  Clear and Coherent  Volume:  Normal  Mood:  Anxious, Depressed and Hopeless  Affect:  Congruent and Flat  Thought Process:  Coherent and Goal Directed  Orientation:  Other:  Oriented to name and place  Thought Content:  Rumination  Suicidal Thoughts:  Yes.  without intent/plan  Homicidal Thoughts:  No  Memory:  Immediate;   Good Recent;   Fair Remote;   Poor  Judgement:  Fair  Insight:  Good  Psychomotor Activity:  Tremor and high anxiety  Concentration:  Good  Recall:  Fair  Akathisia:  No  Handed:  Right  AIMS (if indicated):     Assets:  Desire for Improvement  Sleep:      Past Psychiatric History: Diagnosis: Alcohol dependence, Alcohol with drawal  Hospitalizations: [facility]  Outpatient Care: [facility]  Substance Abuse Care: [facility], 1991, 2007, [place], April 2014  Self-Mutilation: Denies  Suicidal Attempts: Denies attempts, admits thoughts  Violent Behaviors: None reported   Past Medical History:   Past Medical History  Diagnosis Date  . ETOH abuse   . Psoriasis   . Hypertension    Cardiac History:  HTN  Allergies:  No Known Allergies  PTA Medications: Prescriptions prior to admission  Medication Sig Dispense Refill  . escitalopram (LEXAPRO) 20 MG tablet Take 1 tablet (20 mg total) by mouth daily.  30 tablet  0  . gabapentin (NEURONTIN) 400 MG capsule Take 1 capsule (400 mg total) by mouth 3 (three) times daily.  120 capsule  0  . ibuprofen (ADVIL,MOTRIN) 200 MG tablet Take 200 mg by mouth every 6 (six) hours as needed for pain.      . metoprolol (LOPRESSOR) 50 MG tablet Take 1 tablet (50 mg total) by mouth 2 (two)  times daily.  60 tablet  2  . Multiple Vitamin (MULTIVITAMIN WITH MINERALS) TABS tablet Take 1 tablet by mouth daily.      . naproxen sodium (ANAPROX) 220 MG tablet Take 220 mg by mouth 2 (two) times daily with a meal.      . oxyCODONE (OXY IR/ROXICODONE) 5 MG immediate release tablet Take 1-2 tablets (5-10 mg total) by mouth every 4 (four) hours as needed.  15 tablet  0  . QUEtiapine (SEROQUEL) 200 MG tablet Take 1 tablet (200 mg total) by mouth at bedtime.  30 tablet  0  . QUEtiapine (SEROQUEL) 50 MG tablet Take 1 tablet (50 mg total) by mouth 2 (two) times daily.  60 tablet  0    Previous Psychotropic Medications:  Medication/Dose  See medication lists               Substance Abuse History in the last 12 months:  yes  Consequences of Substance Abuse: Medical Consequences:  Liver damage, Possible death by overdose Legal  Consequences:  Arrests, jail time, Loss of driving privilege. Family Consequences:  Family discord, divorce and or separation.  Social History:  reports that he has been smoking Cigarettes.  He has a 20 pack-year smoking history. He has never used smokeless tobacco. He reports that  drinks alcohol. He reports that he does not use illicit drugs. Additional Social History:  Current Place of Residence: [place], [place]    Place of Birth: [place], [place]   Family Members: "My sister"  Marital Status:  Single  Children: 1  Sons: 0  Daughters: 1  Relationships: Single  Education:  [employer] Problems/Performance: Completed high school  Religious Beliefs/Practices: NA  History of Abuse (Emotional/Phsycial/Sexual): Denies  Occupational Experiences: English as a second language teacher History:  None.  Legal History: Pending DUI charges.                        Recent loss of driver's license  Hobbies/Interests: None reported  Family History:   Family History  Problem Relation Age of Onset  . Stroke Father   . Diabetes type II Mother   . Cancer -  Colon Other     Results for orders placed during the hospital encounter of 08/01/13 (from the past 72 hour(s))  CBC WITH DIFFERENTIAL     Status: Abnormal   Collection Time    08/01/13  4:50 PM      Result Value Range   WBC 8.8  4.0 - 10.5 K/uL   RBC 3.40 (*) 4.22 - 5.81 MIL/uL   Hemoglobin 10.4 (*) 13.0 - 17.0 g/dL   HCT 16.1 (*) 09.6 - 04.5 %   MCV 92.9  78.0 - 100.0 fL   MCH 30.6  26.0 - 34.0 pg   MCHC 32.9  30.0 - 36.0 g/dL   RDW 40.9  81.1 - 91.4 %   Platelets 388  150 - 400 K/uL   Neutrophils Relative % 68  43 - 77 %   Neutro Abs 6.0  1.7 - 7.7 K/uL   Lymphocytes Relative 24  12 - 46 %   Lymphs Abs 2.1  0.7 - 4.0 K/uL   Monocytes Relative 6  3 - 12 %   Monocytes Absolute 0.6  0.1 - 1.0 K/uL   Eosinophils Relative 1  0 - 5 %   Eosinophils Absolute 0.1  0.0 - 0.7 K/uL   Basophils Relative 1  0 - 1 %   Basophils Absolute 0.1  0.0 - 0.1 K/uL  COMPREHENSIVE METABOLIC PANEL     Status: Abnormal   Collection Time    08/01/13  4:50 PM      Result Value Range   Sodium 146 (*) 135 - 145 mEq/L   Potassium 3.7  3.5 - 5.1 mEq/L   Chloride 108  96 - 112 mEq/L   CO2 24  19 - 32 mEq/L   Glucose, Bld 98  70 - 99 mg/dL   BUN 7  6 - 23 mg/dL   Creatinine, Ser 7.82  0.50 - 1.35 mg/dL   Calcium 8.5  8.4 - 95.6 mg/dL   Total Protein 7.0  6.0 - 8.3 g/dL   Albumin 3.2 (*) 3.5 - 5.2 g/dL   AST 27  0 - 37 U/L   ALT 43  0 - 53 U/L   Alkaline Phosphatase 80  39 - 117 U/L   Total Bilirubin 0.2 (*) 0.3 - 1.2 mg/dL   GFR calc non Af Amer >90  >90 mL/min  GFR calc Af Amer >90  >90 mL/min   Comment: (NOTE)     The eGFR has been calculated using the CKD EPI equation.     This calculation has not been validated in all clinical situations.     eGFR's persistently <90 mL/min signify possible Chronic Kidney     Disease.  ETHANOL     Status: Abnormal   Collection Time    08/01/13  4:50 PM      Result Value Range   Alcohol, Ethyl (B) 363 (*) 0 - 11 mg/dL   Comment:            LOWEST  DETECTABLE LIMIT FOR     SERUM ALCOHOL IS 11 mg/dL     FOR MEDICAL PURPOSES ONLY  ACETAMINOPHEN LEVEL     Status: None   Collection Time    08/01/13  4:50 PM      Result Value Range   Acetaminophen (Tylenol), Serum <15.0  10 - 30 ug/mL   Comment:            THERAPEUTIC CONCENTRATIONS VARY     SIGNIFICANTLY. A RANGE OF 10-30     ug/mL MAY BE AN EFFECTIVE     CONCENTRATION FOR MANY PATIENTS.     HOWEVER, SOME ARE BEST TREATED     AT CONCENTRATIONS OUTSIDE THIS     RANGE.     ACETAMINOPHEN CONCENTRATIONS     >150 ug/mL AT 4 HOURS AFTER     INGESTION AND >50 ug/mL AT 12     HOURS AFTER INGESTION ARE     OFTEN ASSOCIATED WITH TOXIC     REACTIONS.  SALICYLATE LEVEL     Status: Abnormal   Collection Time    08/01/13  4:50 PM      Result Value Range   Salicylate Lvl <2.0 (*) 2.8 - 20.0 mg/dL  URINALYSIS, ROUTINE W REFLEX MICROSCOPIC     Status: None   Collection Time    08/01/13  8:47 PM      Result Value Range   Color, Urine YELLOW  YELLOW   APPearance CLEAR  CLEAR   Specific Gravity, Urine 1.017  1.005 - 1.030   pH 7.0  5.0 - 8.0   Glucose, UA NEGATIVE  NEGATIVE mg/dL   Hgb urine dipstick NEGATIVE  NEGATIVE   Bilirubin Urine NEGATIVE  NEGATIVE   Ketones, ur NEGATIVE  NEGATIVE mg/dL   Protein, ur NEGATIVE  NEGATIVE mg/dL   Urobilinogen, UA 0.2  0.0 - 1.0 mg/dL   Nitrite NEGATIVE  NEGATIVE   Leukocytes, UA NEGATIVE  NEGATIVE   Comment: MICROSCOPIC NOT DONE ON URINES WITH NEGATIVE PROTEIN, BLOOD, LEUKOCYTES, NITRITE, OR GLUCOSE <1000 mg/dL.  URINE RAPID DRUG SCREEN (HOSP PERFORMED)     Status: Abnormal   Collection Time    08/01/13  8:47 PM      Result Value Range   Opiates NONE DETECTED  NONE DETECTED   Cocaine NONE DETECTED  NONE DETECTED   Benzodiazepines POSITIVE (*) NONE DETECTED   Amphetamines NONE DETECTED  NONE DETECTED   Tetrahydrocannabinol NONE DETECTED  NONE DETECTED   Barbiturates NONE DETECTED  NONE DETECTED   Comment:            DRUG SCREEN FOR MEDICAL  PURPOSES     ONLY.  IF CONFIRMATION IS NEEDED     FOR ANY PURPOSE, NOTIFY LAB     WITHIN 5 DAYS.  LOWEST DETECTABLE LIMITS     FOR URINE DRUG SCREEN     Drug Class       Cutoff (ng/mL)     Amphetamine      1000     Barbiturate      200     Benzodiazepine   200     Tricyclics       300     Opiates          300     Cocaine          300     THC              50  POCT I-STAT TROPONIN I     Status: None   Collection Time    08/01/13 10:28 PM      Result Value Range   Troponin i, poc 0.00  0.00 - 0.08 ng/mL   Comment 3            Comment: Due to the release kinetics of cTnI,     a negative result within the first hours     of the onset of symptoms does not rule out     myocardial infarction with certainty.     If myocardial infarction is still suspected,     repeat the test at appropriate intervals.   Psychological Evaluations:  Assessment:   DSM5: Schizophrenia Disorders:  NA Obsessive-Compulsive Disorders:  NA Trauma-Stressor Disorders:  Anxiety disorder, Substance/Addictive Disorders:  Alcohol Withdrawal (291.81) and Opioid Intoxication (292.89), Alcohol dependence Depressive Disorders:  Major Depressive Disorder - Severe (296.23)  AXIS I:  Alcohol Withdrawal (291.81) and Opioid Intoxication (292.89), Anxiety disorder, major depressive disorder, moderate AXIS II:  Deferred AXIS III:   Past Medical History  Diagnosis Date  . ETOH abuse   . Psoriasis   . Hypertension    AXIS IV:  economic problems, housing problems, occupational problems, other psychosocial or environmental problems and Alcholism AXIS V:  1-10 persistent dangerousness to self and others present  Treatment Plan/Recommendations: 1. Admit for crisis management and stabilization, estimated length of stay 3-5 days.  2. Medication management to reduce current symptoms to base line and improve the patient's overall level of functioning  3. Treat health problems as indicated.  4. Develop treatment  plan to decrease risk of relapse upon discharge and the need for readmission.  5. Psycho-social education regarding relapse prevention and self care.  6. Health care follow up as needed for medical problems.  7. Review, reconcile, and reinstate any pertinent home medications for other health issues where appropriate. 8. Call for consults with hospitalist for any additional specialty patient care services as needed.  Treatment Plan Summary: Daily contact with patient to assess and evaluate symptoms and progress in treatment Medication management Supportive approach coping skills relapse prevention Detox as needed, reassess and address co morbidities  Current Medications:  Current Facility-Administered Medications  Medication Dose Route Frequency Provider Last Rate Last Dose  . acetaminophen (TYLENOL) tablet 650 mg  650 mg Oral Q6H PRN Shuvon Rankin, NP      . alum & mag hydroxide-simeth (MAALOX/MYLANTA) 200-200-20 MG/5ML suspension 30 mL  30 mL Oral Q4H PRN Shuvon Rankin, NP      . chlordiazePOXIDE (LIBRIUM) capsule 25 mg  25 mg Oral Q6H PRN Shuvon Rankin, NP      . chlordiazePOXIDE (LIBRIUM) capsule 25 mg  25 mg Oral QID Shuvon Rankin, NP   25 mg at 08/03/13 1209   Followed by  . [  START ON 08/04/2013] chlordiazePOXIDE (LIBRIUM) capsule 25 mg  25 mg Oral TID Shuvon Rankin, NP       Followed by  . [START ON 08/05/2013] chlordiazePOXIDE (LIBRIUM) capsule 25 mg  25 mg Oral BH-qamhs Shuvon Rankin, NP       Followed by  . [START ON 08/06/2013] chlordiazePOXIDE (LIBRIUM) capsule 25 mg  25 mg Oral Daily Shuvon Rankin, NP      . escitalopram (LEXAPRO) tablet 20 mg  20 mg Oral Daily Shuvon Rankin, NP   20 mg at 08/03/13 1210  . gabapentin (NEURONTIN) capsule 400 mg  400 mg Oral TID Shuvon Rankin, NP   400 mg at 08/03/13 1208  . hydrOXYzine (ATARAX/VISTARIL) tablet 25 mg  25 mg Oral Q6H PRN Shuvon Rankin, NP      . loperamide (IMODIUM) capsule 2-4 mg  2-4 mg Oral PRN Shuvon Rankin, NP      . magnesium  hydroxide (MILK OF MAGNESIA) suspension 30 mL  30 mL Oral Daily PRN Shuvon Rankin, NP      . metoprolol (LOPRESSOR) tablet 50 mg  50 mg Oral BID Shuvon Rankin, NP      . multivitamin with minerals tablet 1 tablet  1 tablet Oral Daily Shuvon Rankin, NP   1 tablet at 08/03/13 1208  . naproxen (NAPROSYN) tablet 250 mg  250 mg Oral BID WC Shuvon Rankin, NP      . ondansetron (ZOFRAN-ODT) disintegrating tablet 4 mg  4 mg Oral Q6H PRN Shuvon Rankin, NP      . oxyCODONE (Oxy IR/ROXICODONE) immediate release tablet 5 mg  5 mg Oral Q4H PRN Shuvon Rankin, NP      . QUEtiapine (SEROQUEL) tablet 200 mg  200 mg Oral QHS Shuvon Rankin, NP      . QUEtiapine (SEROQUEL) tablet 50 mg  50 mg Oral BID Shuvon Rankin, NP      . [START ON 08/04/2013] thiamine (VITAMIN B-1) tablet 100 mg  100 mg Oral Daily Shuvon Rankin, NP        Observation Level/Precautions:  15 minute checks  Laboratory:  Reviewed ED lab findings on file  Psychotherapy:  Group sessions  Medications: See medication lists  Consultations: As needed   Discharge Concerns: Maintaining sobriety   Estimated LOS: 3-5  Other:     I certify that inpatient services furnished can reasonably be expected to improve the patient's condition.   Sanjuana Kava, PMHNP-BC 9/17/20141:51 PM Personally examined the patients, reviewed the physical exam and agree with assessment and plan Madie Reno A. Dub Mikes, M.D.

## 2013-08-03 NOTE — Progress Notes (Signed)
Patient ID: Jeffrey Black, male   DOB: 12/25/1960, 52 y.o.   MRN: [id number] Patient is a 52 yo male admitted to [facility] for alcohol detox.  Patient states he binge drinks, sometimes drinking more than a 1/5 day.  Patient had a period of sobriety for eight years ending in 2003.  These past 3 weeks he has been drinking continuously.  ETOH level in ED was 363.  His last detox was here at [facility] in July 2014.  When he was discharged in July, his follow up was with [facility].  Patient did not follow up at that time.  He has also been to [facility] and [facility].  He c/o sternum pain from a previous fall while intoxicated. Xrays and EKG were negative. Patient reports decreased concentration, decreased appetite, anxiety and depression.  He feels hopeless.  He does attend AA and has a current sponsor whom he did not want to contact. PMH includes hypertension and psorisis of the skin.  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  Patient denies any SI/HI/AVH.  He does report one prior suicide attempt.  Patient put on librium protocol for withdrawal.  CIWA is an 8.  Patient was oriented to room and unit.

## 2013-08-03 NOTE — Progress Notes (Signed)
Adult Psychoeducational Group Note  Date:  08/03/2013 Time:  2:50 PM  Group Topic/Focus:  Managing Feelings:   The focus of this group is to identify what feelings patients have difficulty handling and develop a plan to handle them in a healthier way upon discharge.  Participation Level:  Minimal  Participation Quality:  Attentive  Affect:  Appropriate  Cognitive:  Oriented  Insight: Limited  Engagement in Group:  None  Modes of Intervention:  Discussion, Education and Support  Additional Comments:  Pt was on the unit only 15 minutes before he came to group.  Reynolds Bowl 08/03/2013, 2:50 PM

## 2013-08-03 NOTE — [facility] Suicide Risk Assessment (Signed)
Suicide Risk Assessment  Admission Assessment     Nursing information obtained from:  Patient Demographic factors:  Male;Caucasian;Unemployed;Low socioeconomic status;Divorced or widowed Current Mental Status:  NA Loss Factors:  Decrease in vocational status;Financial problems / change in socioeconomic status Historical Factors:  Family history of mental illness or substance abuse;Prior suicide attempts Risk Reduction Factors:  Sense of responsibility to family  CLINICAL FACTORS:   Depression:   Comorbid alcohol abuse/dependence Hopelessness Alcohol/Substance Abuse/Dependencies  COGNITIVE FEATURES THAT CONTRIBUTE TO RISK:  Closed-mindedness Polarized thinking Thought constriction (tunnel vision)    SUICIDE RISK:   Moderate:  Frequent suicidal ideation with limited intensity, and duration, some specificity in terms of plans, no associated intent, good self-control, limited dysphoria/symptomatology, some risk factors present, and identifiable protective factors, including available and accessible social support.  PLAN OF CARE: Supportive approach/coping skills/relapse prevention                               Librium Detox protocol                               Reassess and address the co morbidities                               Optimize treatment with psychotorpics  I certify that inpatient services furnished can reasonably be expected to improve the patient's condition.  Jeffrey Black 08/03/2013, 3:45 PM

## 2013-08-03 NOTE — Progress Notes (Signed)
Adult Psychoeducational Group Note  Date:  08/03/2013 Time:  9:49 PM  Group Topic/Focus:  NA GROUP  Participation Level:  Active  Participation Quality:  Appropriate  Affect:  Appropriate  Cognitive:  Appropriate  Insight: Appropriate  Engagement in Group:  Engaged  Modes of Intervention:  Discussion  Additional Comments:    Octavio Manns 08/03/2013, 9:49 PM

## 2013-08-03 NOTE — [facility] Group Notes (Signed)
[facility] LCSW Group Therapy  08/03/2013 3:13 PM  Type of Therapy:  Group Therapy  Participation Level:  Active  Participation Quality:  Attentive  Affect:  Blunted and Depressed  Cognitive:  Alert and Oriented  Insight:  Engaged  Engagement in Therapy:  Engaged  Modes of Intervention:  Confrontation, Discussion, Education, Exploration, Socialization and Support  Summary of Progress/Problems: Emotion Regulation: This group focused on both positive and negative emotion identification and allowed group members to process ways to identify feelings, regulate negative emotions, and find healthy ways to manage internal/external emotions. Group members were asked to reflect on a time when their reaction to an emotion led to a negative outcome and explored how alternative responses using emotion regulation would have benefited them. Group members were also asked to discuss a time when emotion regulation was utilized when a negative emotion was experienced. Jeffrey Black was engaged and attentive throughout today's group. He stated that "despair" was his current emotion and described how this felt to him both emotionally and physically. Tom demonstrates insight by his ability to describe despair, identify triggers "my family is forcing me to go to a christian based recovery knowing that this does not coincide with my beliefs. I feel misunderstood and abandoned by them," and explore ways to combat despair in healthy ways that can take the place of "drinking massive amounts of alcohol." "I plan to go into long term treatment I think, but right now, I am trying to detox, clear my head, and lay out my goals one by one."    Smart, Welden Hausmann 08/03/2013, 3:13 PM

## 2013-08-03 NOTE — Tx Team (Signed)
Initial Interdisciplinary Treatment Plan  PATIENT STRENGTHS: (choose at least two) Average or above average intelligence Capable of independent living Communication skills General fund of knowledge Physical Health Supportive family/friends  PATIENT STRESSORS: Financial difficulties Occupational concerns Substance abuse   PROBLEM LIST: Problem List/Patient Goals Date to be addressed Date deferred Reason deferred Estimated date of resolution  Substance Abuse 08/03/2013     Depression 08/03/2013     To remain clean and sober 08/03/2013     Hx of suicide attempt 08/03/2013                                    DISCHARGE CRITERIA:  Ability to meet basic life and health needs Improved stabilization in mood, thinking, and/or behavior Motivation to continue treatment in a less acute level of care Need for constant or close observation no longer present Reduction of life-threatening or endangering symptoms to within safe limits Verbal commitment to aftercare and medication compliance Withdrawal symptoms are absent or subacute and managed without 24-hour nursing intervention  PRELIMINARY DISCHARGE PLAN: Attend aftercare/continuing care group Attend 12-step recovery group Return to previous living arrangement  PATIENT/FAMIILY INVOLVEMENT: This treatment plan has been presented to and reviewed with the patient, Jeffrey Black.  The patient and family have been given the opportunity to ask questions and make suggestions.  Cranford Mon 08/03/2013, 12:14 PM

## 2013-08-04 DIAGNOSIS — F1994 Other psychoactive substance use, unspecified with psychoactive substance-induced mood disorder: Secondary | ICD-10-CM

## 2013-08-04 MED ORDER — NICOTINE 21 MG/24HR TD PT24
21.0000 mg | MEDICATED_PATCH | Freq: Every day | TRANSDERMAL | Status: DC
  Administered 2013-08-04 – 2013-08-09 (×6): 21 mg via TRANSDERMAL
  Filled 2013-08-04 (×9): qty 1

## 2013-08-04 NOTE — Progress Notes (Signed)
Adult Psychoeducational Group Note  Date:  08/04/2013 Time:  1:36 PM  Group Topic/Focus:  Building Self Esteem:   The Focus of this group is helping patients become aware of the effects of self-esteem on their lives, the things they and others do that enhance or undermine their self-esteem, seeing the relationship between their level of self-esteem and the choices they make and learning ways to enhance self-esteem.  Participation Level:  Did Not Attend  Cathlean Cower 08/04/2013, 1:36 PM

## 2013-08-04 NOTE — Progress Notes (Signed)
D: Patient appropriate and cooperative with staff and peers. Patient's affect/mood is depressed. He reported on the self inventory sheet that his sleep is poor, appetite and ability to pay attention are both improving, and energy level is low. Patient rated depression "8" and feelings of hopelessness "9". Compliant with medication regimen. Attending some, but not all groups.  A: Support and encouragement provided to patient. Administered scheduled medications per ordering MD. Monitor Q15 minute checks for safety.  R: Patient receptive. Passive SI, but contracts for safety. Denies HI and AVH. Patient remains safe on the unit.

## 2013-08-04 NOTE — Progress Notes (Signed)
[facility] MD Progress Note  08/04/2013 4:40 PM Jeffrey Black  MRN:  [id number] Subjective:  Admits to a lot of worry. He is dealing with a lot of shame and guilt for his behavior. States he does not know what to do where to go from here. He feels very hopeless. He would like to start all over again somewhere else. Feels no sense to attachment any where. Feels that his father is not going to allow him to stay there and his sister his sister either. When he starts thinking like this he becomes more and more depressed Diagnosis:   DSM5: Schizophrenia Disorders:   Obsessive-Compulsive Disorders:   Trauma-Stressor Disorders:   Substance/Addictive Disorders:  Alcohol Related Disorder - Severe (303.90) Depressive Disorders:  Major Depressive Disorder - Severe (296.23)  Axis I: Substance Induced Mood Disorder  ADL's:  Intact  Sleep: Poor  Appetite:  Poor  Suicidal Ideation:  Plan:  denies Intent:  denies Means:  denies Homicidal Ideation:  Plan:  denies Intent:  denies Means:  denies AEB (as evidenced by):  Psychiatric Specialty Exam: Review of Systems  Constitutional: Negative.   HENT: Negative.   Eyes: Negative.   Respiratory: Negative.   Cardiovascular: Negative.   Gastrointestinal: Negative.   Genitourinary: Negative.   Musculoskeletal: Negative.   Skin: Negative.   Neurological: Negative.   Endo/Heme/Allergies: Negative.   Psychiatric/Behavioral: Positive for depression and substance abuse. The patient is nervous/anxious.     Blood pressure 104/69, pulse 80, temperature 97.5 F (36.4 C), temperature source Oral, resp. rate 16, height 6' (1.829 m), weight 92.08 kg (203 lb).Body mass index is 27.53 kg/(m^2).  General Appearance: Disheveled  Eye Solicitor::  Fair  Speech:  Clear and Coherent, Slow and not spontaneous  Volume:  Decreased  Mood:  Anxious, Depressed and worried  Affect:  depressed, worried  Thought Process:  Coherent and Goal Directed  Orientation:  Full  (Time, Place, and Person)  Thought Content:  worries, concerns, ruminations, a sense of hopelessness  Suicidal Thoughts:  No  Homicidal Thoughts:  No  Memory:  Immediate;   Fair Recent;   Fair Remote;   Fair  Judgement:  Fair  Insight:  superificial  Psychomotor Activity:  Restlessness  Concentration:  Fair  Recall:  Fair  Akathisia:  No  Handed:    AIMS (if indicated):     Assets:  Desire for Improvement  Sleep:  Number of Hours: 5.75   Current Medications: Current Facility-Administered Medications  Medication Dose Route Frequency Provider Last Rate Last Dose  . acetaminophen (TYLENOL) tablet 650 mg  650 mg Oral Q6H PRN Shuvon Rankin, NP      . alum & mag hydroxide-simeth (MAALOX/MYLANTA) 200-200-20 MG/5ML suspension 30 mL  30 mL Oral Q4H PRN Shuvon Rankin, NP      . chlordiazePOXIDE (LIBRIUM) capsule 25 mg  25 mg Oral Q6H PRN Shuvon Rankin, NP      . chlordiazePOXIDE (LIBRIUM) capsule 25 mg  25 mg Oral TID Shuvon Rankin, NP   25 mg at 08/04/13 1156   Followed by  . [START ON 08/05/2013] chlordiazePOXIDE (LIBRIUM) capsule 25 mg  25 mg Oral BH-qamhs Shuvon Rankin, NP       Followed by  . [START ON 08/06/2013] chlordiazePOXIDE (LIBRIUM) capsule 25 mg  25 mg Oral Daily Shuvon Rankin, NP      . escitalopram (LEXAPRO) tablet 20 mg  20 mg Oral Daily Shuvon Rankin, NP   20 mg at 08/04/13 0806  . gabapentin (NEURONTIN) capsule 400  mg  400 mg Oral TID Shuvon Rankin, NP   400 mg at 08/04/13 1156  . hydrOXYzine (ATARAX/VISTARIL) tablet 25 mg  25 mg Oral Q6H PRN Shuvon Rankin, NP      . loperamide (IMODIUM) capsule 2-4 mg  2-4 mg Oral PRN Shuvon Rankin, NP      . magnesium hydroxide (MILK OF MAGNESIA) suspension 30 mL  30 mL Oral Daily PRN Shuvon Rankin, NP      . metoprolol (LOPRESSOR) tablet 50 mg  50 mg Oral BID Shuvon Rankin, NP   50 mg at 08/04/13 0806  . multivitamin with minerals tablet 1 tablet  1 tablet Oral Daily Shuvon Rankin, NP   1 tablet at 08/04/13 0807  . naproxen (NAPROSYN)  tablet 250 mg  250 mg Oral BID WC Shuvon Rankin, NP   250 mg at 08/04/13 0808  . nicotine (NICODERM CQ - dosed in mg/24 hours) patch 21 mg  21 mg Transdermal Daily Rachael Fee, MD   21 mg at 08/04/13 1157  . ondansetron (ZOFRAN-ODT) disintegrating tablet 4 mg  4 mg Oral Q6H PRN Shuvon Rankin, NP      . oxyCODONE (Oxy IR/ROXICODONE) immediate release tablet 5 mg  5 mg Oral Q4H PRN Shuvon Rankin, NP   5 mg at 08/03/13 2132  . QUEtiapine (SEROQUEL) tablet 200 mg  200 mg Oral QHS Shuvon Rankin, NP   200 mg at 08/03/13 2129  . QUEtiapine (SEROQUEL) tablet 50 mg  50 mg Oral BID Shuvon Rankin, NP   50 mg at 08/04/13 0807  . thiamine (VITAMIN B-1) tablet 100 mg  100 mg Oral Daily Shuvon Rankin, NP   100 mg at 08/04/13 0805    Lab Results: No results found for this or any previous visit (from the past 48 hour(s)).  Physical Findings: AIMS: Facial and Oral Movements Muscles of Facial Expression: None, normal Lips and Perioral Area: None, normal Jaw: None, normal Tongue: None, normal,Extremity Movements Upper (arms, wrists, hands, fingers): None, normal Lower (legs, knees, ankles, toes): None, normal, Trunk Movements Neck, shoulders, hips: None, normal, Overall Severity Severity of abnormal movements (highest score from questions above): None, normal Incapacitation due to abnormal movements: None, normal Patient's awareness of abnormal movements (rate only patient's report): No Awareness, Dental Status Current problems with teeth and/or dentures?: No Does patient usually wear dentures?: No  CIWA:  CIWA-Ar Total: 2 COWS:     Treatment Plan Summary: Daily contact with patient to assess and evaluate symptoms and progress in treatment Medication management  Plan: Supportive approach/coping skills/relapse prevention            Reassess and address the co morbidities            Optimize treatment with psychotropics  Medical Decision Making Problem Points:  Review of psycho-social stressors  (1) Data Points:  Review of new medications or change in dosage (2)  I certify that inpatient services furnished can reasonably be expected to improve the patient's condition.   Kimbra Marcelino A 08/04/2013, 4:40 PM

## 2013-08-04 NOTE — Progress Notes (Signed)
Recreation Therapy Notes  Date: 09.18.2014 Time: 2:30pm Location: [address] Dayroom  Group Topic: Software engineer Activities (AAA)  Behavioral Response: Engaged, Attentive, Appropriate  Affect: Euthymic  Clinical Observations/Feedback: Dog Team: [employer] Patient interacted appropriately with peer, dog team, LRT and MHT.   Marykay Lex Maloni Musleh, LRT/CTRS  Jearl Klinefelter 08/04/2013 4:19 PM

## 2013-08-04 NOTE — Progress Notes (Signed)
D: Patient denies SI/HI/AVH. Patient rates hopelessness as 5,  depression as 8, and anxiety as 8.  Patient affect is depressed. Mood is depressed and anxious.  Pt states, "I'm afraid I'm going to be homeless when I leave here.  Today has been a bad day for me."  Patient did attend evening group. Patient visible on the milieu. No distress noted. A: Support and encouragement offered. Scheduled medications given to pt. Q 15 min checks continued for patient safety. R: Patient receptive. Patient remains safe on the unit.

## 2013-08-04 NOTE — [facility] Group Notes (Signed)
[facility] LCSW Group Therapy  08/04/2013 3:16 PM  Type of Therapy:  Group Therapy  Participation Level:  Minimal  Participation Quality:  Attentive  Affect:  Blunted  Cognitive:  Alert  Insight:  Limited  Engagement in Therapy:  Limited  Modes of Intervention:  Discussion, Education, Exploration, Socialization and Support  Summary of Progress/Problems:  Finding Balance in Life. Today's group focused on defining balance in one's own words, identifying things that can knock one off balance, and exploring healthy ways to maintain balance in life. Group members were asked to provide an example of a time when they felt off balance, describe how they handled that situation,and process healthier ways to regain balance in the future. Group members were asked to share the most important tool for maintaining balance that they learned while at [facility] and how they plan to apply this method after discharge. Jamarco was attentive but did not actively participate in group therapy. He stated that hopelessness and despair made him feel off balance. Octavious also stated that "I don't know what I want to do when I leave the hospital. I don't know what will be best for me and my family is trying to take full control. I don't like that. I feel like I have no control." Jedediah shows limited progress in the group setting AEB his resistance to active participation and limited insight AEB his inability to explore his choices for i/p vs. O/p treatment or ways to cope with family pressure. He appeared to get overwhelmed quickly and stated that he felt "cloudy and unable to really think about the future at this point."     Smart, Toneisha Savary 08/04/2013, 3:16 PM

## 2013-08-04 NOTE — Progress Notes (Signed)
Recreation Therapy Notes  Date: 09.18.2014 Time: 3:00pm Location: 300 Hall Dayroom  Group Topic: Communication, Team Building, Problem Solving  Goal Area(s) Addresses:  Patient will effectively work with peer towards shared goal.  Patient will identify skill used to make activity successful.  Patient will identify how skills used during activity can be used to reach post d/c goals.   Behavioral Response: Engaged, Appropriate, Attentive.  Intervention: Problem Solving Activitiy  Activity: Life Boat. Patients were given a scenario about being on a sinking yacht. Patients were informed the yacht included 15 guest, 8 of which could be placed on the life boat, along with all group members. Individuals on guest list were of varying socioeconomic classes such as a Education officer, museum, Materials engineer, Midwife, Tree surgeon.   Education: Customer service manager, Discharge Planning   Education Outcome: Acknowledges understanding  Clinical Observations/Feedback: Patient made no contributions to opening discussion, but appeared to actively listen as he maintained appropriate eye contact with speaker. Patient actively engaged in group activity, voicing his opinion and debating with group members appropriately. Patient agreed with peer identified qualities that guided group decision making. Patient identified team work as a Marketing executive necessary to make activity a success. Additionally patient verbalized connection between communication, team work and problem solving.    Marykay Lex Rjay Revolorio, LRT/CTRS  Jearl Klinefelter 08/04/2013 4:35 PM

## 2013-08-04 NOTE — [facility] Suicide Risk Assessment (Signed)
[facility] INPATIENT:  Family/Significant Other Suicide Prevention Education  Suicide Prevention Education:  Education Completed; [place] [employer] (pt's sister) [phone] has been identified by the patient as the family member/significant other with whom the patient will be residing, and identified as the person(s) who will aid the patient in the event of a mental health crisis (suicidal ideations/suicide attempt).  With written consent from the patient, the family member/significant other has been provided the following suicide prevention education, prior to the and/or following the discharge of the patient.  The suicide prevention education provided includes the following:  Suicide risk factors  Suicide prevention and interventions  National Suicide Hotline telephone number  [facility] assessment telephone number  [facility] Emergency Assistance 911  [facility] and/or Residential Mobile Crisis Unit telephone number  Request made of family/significant other to:  Remove weapons (e.g., guns, rifles, knives), all items previously/currently identified as safety concern.    Remove drugs/medications (over-the-counter, prescriptions, illicit drugs), all items previously/currently identified as a safety concern.  The family member/significant other verbalizes understanding of the suicide prevention education information provided.  The family member/significant other agrees to remove the items of safety concern listed above.  Smart, Arlisa Leclere 08/04/2013, 11:40 AM

## 2013-08-04 NOTE — Progress Notes (Deleted)
Recreation Therapy Notes  Date: 09.18.2014 Time: 2:30pm Location: 300 Hall Dayroom  Group Topic: Animal Assisted Activities (AAA)  Behavioral Response: Did not attend.   Candice Tobey L Artemisia Auvil, LRT/CTRS  Marquelle Musgrave L 08/04/2013 4:16 PM 

## 2013-08-05 DIAGNOSIS — F329 Major depressive disorder, single episode, unspecified: Secondary | ICD-10-CM

## 2013-08-05 MED ORDER — INFLUENZA VAC SPLIT QUAD 0.5 ML IM SUSP
0.5000 mL | Freq: Once | INTRAMUSCULAR | Status: DC
  Filled 2013-08-05: qty 0.5

## 2013-08-05 NOTE — Progress Notes (Signed)
D patient slept fairly well last nite, appetite and ability to pay attention is improving and energy level is low, depresssed 7/10 and hopeless 6/10 today, WD s/s include tremors and agitation, headaches and dizziness. Passive SI, pain goal is 7/10. No complaints at this time, taking meds as ordered by MD, attending DR for meals and going to some groups and participation is minimal.  A q43min safety checks continue and support offered, encouraged to attend more groups and particpate. R patient remains safe on the unit

## 2013-08-05 NOTE — [facility] Group Notes (Signed)
Adult Psychoeducational Group Note  Date:  08/05/2013 Time:  10:38 PM  Group Topic/Focus:  AA  Participation Level:  Active  Participation Quality:  Appropriate  Affect:  Appropriate  Cognitive:  Appropriate  Insight: Appropriate  Engagement in Group:  Engaged  Modes of Intervention:  Discussion  Additional Comments:  Jeffrey Black attended AA group.  [name] A 08/05/2013, 10:38 PM

## 2013-08-05 NOTE — Progress Notes (Signed)
Adult Psychoeducational Group Note  Date:  08/05/2013 Time:  12:13 PM  Group Topic/Focus:  Relapse Prevention Planning:   The focus of this group is to define relapse and discuss the need for planning to combat relapse.  Participation Level:  Active  Participation Quality:  Appropriate  Affect:  Appropriate  Cognitive:  Appropriate  Insight: Appropriate  Engagement in Group:  Engaged  Modes of Intervention:  Discussion   Jeffrey Black 08/05/2013, 12:13 PM

## 2013-08-05 NOTE — [facility] Group Notes (Signed)
[facility] LCSW Aftercare Discharge Planning Group Note   08/05/2013 9:21 AM  Participation Quality:  Appropriate   Mood/Affect:  Blunted  Depression Rating:  7  Anxiety Rating:  9  Thoughts of Suicide:  No Will you contract for safety?   NA  Current AVH:  No  Plan for Discharge/Comments:  Pt reports that he is still experiencing moderate withdrawal symptoms including headache and tremors. He is interested in finding out if [facility] has bed availability and if they will work with his insurance. Pt also interested in [facility] but is currently set up with [employer]. Pt resistant to signing any releases allowing CSW to make referral to these facilities at this time.   Transportation Means: Sister  Supports: sister and niece   Counselling psychologist, [name]

## 2013-08-05 NOTE — Tx Team (Signed)
Interdisciplinary Treatment Plan Update (Adult)  Date: 08/05/2013   Time Reviewed: 11:24 AM  Progress in Treatment:  Attending groups: Yes Participating in groups:  Minimally.  Taking medication as prescribed: Yes  Tolerating medication: Yes  Family/Significant othe contact made: SPE completed with pt's sister on 9/18.  Patient understands diagnosis: Yes, AEB seeking treatment for ETOH detox, depression, and medication management.  Discussing patient identified problems/goals with staff: Yes  Medical problems stabilized or resolved: Yes  Denies suicidal/homicidal ideation: Yes, during group/self report.  Patient has not harmed self or Others: Yes  New problem(s) identified: Pt resistant to deciding upon d/c plan. Discharge Plan or Barriers: Pt interested in [facility] but currently has [employer]. Pt given information to several recovery houses. He is also interested in [facility] if insurance is accepted.  Additional comments: This is an admission assessment for this 52 year old Caucasian male. Admitted to [facility] from the [facility] with complaints of alcohol intoxication/withrawal symptoms requesting detox treatment. Patient reports, "The police escorted me to the hospital 2 days ago. I was living in an extended stay motel. It was 2 days ago when the manager of the motel wanted me to pay for the week on that day. I wanted to pay later because I was too drunk to do anything concrete at the time. I wanted all the alcohol in me to come down a little so that I can get myself together. The manager did not want to wait. She wanted the money right then. When I did not do as she had hoped, she got mad and called the cops.The cops came, realized that I was wasted decided to take me to the hospital instead. I have been drinking heavily and daily x 6 weeks. I drink 2 (half gallons) of ever clear and 3-4 bottles of vodka in 3 days. I'm absolutely an alcoholic since 1991. Now, I have been sober for 8 years  and 3 months from 1995 -2003. Alcohol gives me euphoria, it relieves my anxiety and loneliness. It has also gotten me into a big problem with the law. I have a pending DUI with careless and reckless driving charge 1 month ago. I also lost my driver's license as well". Reason for Continuation of Hospitalization: Librium taper-withdrawals Mood stabilization Medication management  Estimated length of stay: 3-4 days  For review of initial/current patient goals, please see plan of care.  Attendees:  Patient:    Family:    Physician: Geoffery Lyons MD 08/05/2013 11:25 AM   Nursing: Alease Frame RN  08/05/2013 11:25 AM   Clinical Social Worker Braylinn Gulden Smart, LCSWA  08/05/2013 11:25 AM   Other: Chandra Batch. PA  08/05/2013 11:25 AM  Other: Darden Dates Nurse CM 08/05/2013 11:25 AM   Other:    Other:    Scribe for Treatment Team:  [employer] LCSWA  08/05/2013 11:25 AM

## 2013-08-05 NOTE — Progress Notes (Signed)
Patient did attend the evening karaoke group. Pt was attentive and supportive but did not sing a song.     

## 2013-08-05 NOTE — Progress Notes (Signed)
INITIAL NUTRITION ASSESSMENT  Pt identified as at risk on the Malnutrition Screen Tool  INTERVENTION: 1. Discussed the importance of healthy eating and encouraged adequate intake.  2. Discussed weight goals.  NUTRITION DIAGNOSIS: Predicted suboptimal oral intake related to etoh dependency as evidenced by recent history of poor intake.   Goal: Patient will meet >/=90% of estimated nutrition needs  Monitor:  PO intake  52 y.o. male  Admitting Dx: ETOH Dependency  ASSESSMENT: Patient with chronic alcohol addiction. He reports that he has been living in an extended stay motel for the last 6 weeks, and drinking heavily. He reports a decrease in intake due alcohol consumption. Appetite has improved, and he is eating most of his meals. He has not lost a significant amount of weight in the last 4-5 months, but reports a 10 pound weight loss since December (5% UBW).   Height: Ht Readings from Last 1 Encounters:  08/03/13 6' (1.829 m)    Weight: Wt Readings from Last 1 Encounters:  08/03/13 203 lb (92.08 kg)    Wt Readings from Last 10 Encounters:  08/03/13 203 lb (92.08 kg)  07/23/13 200 lb (90.719 kg)  07/14/13 200 lb (90.719 kg)  06/25/13 200 lb (90.719 kg)  05/18/13 193 lb 2 oz (87.6 kg)  04/29/13 203 lb 11.3 oz (92.4 kg)  04/29/13 203 lb 11.3 oz (92.4 kg)  04/17/13 210 lb (95.255 kg)  04/16/13 208 lb (94.348 kg)  04/13/13 209 lb 10.5 oz (95.1 kg)    Usual Body Weight: 213 pounds (December 2013)  % Usual Body Weight: 95%  BMI:  Body mass index is 27.53 kg/(m^2). Patient is overweight.  Estimated Nutritional Needs: Kcal: 25-30 kcal/kg Protein: 1 g/kg Fluid: 1 ml/kcal   Diet Order: General  Lab results and medications reviewed.    Linnell Fulling, RD, LDN Pager #: [phone] After-Hours Pager #: [phone]

## 2013-08-05 NOTE — [facility] Group Notes (Signed)
[facility] LCSW Group Therapy  08/05/2013 3:41 PM  Type of Therapy:  Group Therapy  Participation Level:  Active  Participation Quality:  Attentive  Affect:  Appropriate  Cognitive:  Alert and Oriented  Insight:  Improving  Engagement in Therapy:  Engaged  Modes of Intervention:  Discussion, Education, Exploration, Socialization and Support  Summary of Progress/Problems: Feelings around Relapse. Group members discussed the meaning of relapse and shared personal stories of relapse, how it affected them and others, and how they perceived themselves during this time. Group members were encouraged to identify triggers, warning signs and coping skills used when facing the possibility of relapse. Social supports were discussed and explored in detail. Jeffrey Black was attentive and engaged throughout today's group. Jeffrey Black shows progress in the group setting AEB his ability to identify triggers for relapse "I hold resentments toward myself and others and let them eat at me until I need to numb the pain it causes with alcohol." "relationships are a huge trigger for me." Jeffrey Black shared his last experience in an unhealthy relationship that sparked his latest relapse. Jeffrey Black was also able to identify ways to break the cycle of relapse/addiction by "Thinking about the potential outcomes to his actions rather than impulsively acting on my cravings, going back to AA and reconnecting with my sponsor."    Smart, [name] 08/05/2013, 3:41 PM

## 2013-08-05 NOTE — Progress Notes (Signed)
D Anatole is seen OOB UAL on the 300 hall today...tolerated well. HE is pleasant, cooperative, engaged in his treatment and his POC and takes his medications as ordered.    A HE interacts appropriately with his peers without diff.    R Safety is in place and POC moves forward.

## 2013-08-05 NOTE — Progress Notes (Signed)
[facility] MD Progress Note  08/05/2013 4:57 PM Jeffrey Black  MRN:  [id number] Subjective:  [name] is still not sure of what he needs to do. States that his family is getting tired of his drinking and he is tired too. States he does not have a clear idea of why he keeps doing it. States that the last time he wanted to get it out of his system so he drank but the few drinks trigger a full relapse. He states he is not particularly religious for what going to a religious program will not work for him. Admits he feels hopeless, helpeless Diagnosis:   DSM5: Schizophrenia Disorders:   Obsessive-Compulsive Disorders:   Trauma-Stressor Disorders:   Substance/Addictive Disorders:  Alcohol Related Disorder - Severe (303.90) Depressive Disorders:  Major Depressive Disorder - Moderate (296.22)  Axis I: Depressive Disorder NOS and Substance Induced Mood Disorder  ADL's:  Intact  Sleep: Fair  Appetite:  Fair  Suicidal Ideation:  Plan:  denies Intent:  denies Means:  denies Homicidal Ideation:  Plan:  denies Intent:  denies Means:  denies AEB (as evidenced by):  Psychiatric Specialty Exam: Review of Systems  Constitutional: Negative.   HENT: Negative.   Eyes: Negative.   Respiratory: Negative.   Cardiovascular: Negative.   Gastrointestinal: Negative.   Genitourinary: Negative.   Musculoskeletal: Negative.   Skin: Positive for rash.  Neurological: Negative.   Endo/Heme/Allergies: Negative.   Psychiatric/Behavioral: Positive for depression and substance abuse. The patient is nervous/anxious.     Blood pressure 122/81, pulse 76, temperature 97.4 F (36.3 C), temperature source Oral, resp. rate 16, height 6' (1.829 m), weight 92.08 kg (203 lb).Body mass index is 27.53 kg/(m^2).  General Appearance: Fairly Groomed  Patent attorney::  Fair  Speech:  Clear and Coherent, Slow and not spontaneous  Volume:  Normal  Mood:  Anxious, Depressed, Hopeless and worried  Affect:  Restricted  Thought  Process:  Coherent and Goal Directed  Orientation:  Full (Time, Place, and Person)  Thought Content:  worries, concerns, fear of relapsing  Suicidal Thoughts:  No  Homicidal Thoughts:  No  Memory:  Immediate;   Fair Recent;   Fair Remote;   Fair  Judgement:  Fair  Insight:  Present  Psychomotor Activity:  Restlessness  Concentration:  Fair  Recall:  Fair  Akathisia:  No  Handed:    AIMS (if indicated):     Assets:  Desire for Improvement  Sleep:  Number of Hours: 6.25   Current Medications: Current Facility-Administered Medications  Medication Dose Route Frequency Provider Last Rate Last Dose  . acetaminophen (TYLENOL) tablet 650 mg  650 mg Oral Q6H PRN Shuvon Rankin, NP      . alum & mag hydroxide-simeth (MAALOX/MYLANTA) 200-200-20 MG/5ML suspension 30 mL  30 mL Oral Q4H PRN Shuvon Rankin, NP   30 mL at 08/04/13 2005  . chlordiazePOXIDE (LIBRIUM) capsule 25 mg  25 mg Oral Q6H PRN Shuvon Rankin, NP      . chlordiazePOXIDE (LIBRIUM) capsule 25 mg  25 mg Oral BH-qamhs Shuvon Rankin, NP   25 mg at 08/05/13 0804   Followed by  . [START ON 08/06/2013] chlordiazePOXIDE (LIBRIUM) capsule 25 mg  25 mg Oral Daily Shuvon Rankin, NP      . escitalopram (LEXAPRO) tablet 20 mg  20 mg Oral Daily Shuvon Rankin, NP   20 mg at 08/05/13 0804  . gabapentin (NEURONTIN) capsule 400 mg  400 mg Oral TID Shuvon Rankin, NP   400 mg at  08/05/13 1117  . hydrOXYzine (ATARAX/VISTARIL) tablet 25 mg  25 mg Oral Q6H PRN Shuvon Rankin, NP      . influenza vac split quadrivalent PF (FLUARIX) injection 0.5 mL  0.5 mL Intramuscular Once Rachael Fee, MD      . loperamide (IMODIUM) capsule 2-4 mg  2-4 mg Oral PRN Shuvon Rankin, NP      . magnesium hydroxide (MILK OF MAGNESIA) suspension 30 mL  30 mL Oral Daily PRN Shuvon Rankin, NP      . metoprolol (LOPRESSOR) tablet 50 mg  50 mg Oral BID Shuvon Rankin, NP   50 mg at 08/05/13 0804  . multivitamin with minerals tablet 1 tablet  1 tablet Oral Daily Shuvon Rankin, NP    1 tablet at 08/05/13 0804  . naproxen (NAPROSYN) tablet 250 mg  250 mg Oral BID WC Shuvon Rankin, NP   250 mg at 08/05/13 0805  . nicotine (NICODERM CQ - dosed in mg/24 hours) patch 21 mg  21 mg Transdermal Daily Rachael Fee, MD   21 mg at 08/05/13 0805  . ondansetron (ZOFRAN-ODT) disintegrating tablet 4 mg  4 mg Oral Q6H PRN Shuvon Rankin, NP      . oxyCODONE (Oxy IR/ROXICODONE) immediate release tablet 5 mg  5 mg Oral Q4H PRN Shuvon Rankin, NP   5 mg at 08/04/13 2234  . QUEtiapine (SEROQUEL) tablet 200 mg  200 mg Oral QHS Shuvon Rankin, NP   200 mg at 08/04/13 2234  . QUEtiapine (SEROQUEL) tablet 50 mg  50 mg Oral BID Shuvon Rankin, NP   50 mg at 08/05/13 0804  . thiamine (VITAMIN B-1) tablet 100 mg  100 mg Oral Daily Shuvon Rankin, NP   100 mg at 08/05/13 0803    Lab Results: No results found for this or any previous visit (from the past 48 hour(s)).  Physical Findings: AIMS: Facial and Oral Movements Muscles of Facial Expression: None, normal Lips and Perioral Area: None, normal Jaw: None, normal Tongue: None, normal,Extremity Movements Upper (arms, wrists, hands, fingers): None, normal Lower (legs, knees, ankles, toes): None, normal, Trunk Movements Neck, shoulders, hips: None, normal, Overall Severity Severity of abnormal movements (highest score from questions above): None, normal Incapacitation due to abnormal movements: None, normal Patient's awareness of abnormal movements (rate only patient's report): No Awareness, Dental Status Current problems with teeth and/or dentures?: No Does patient usually wear dentures?: No  CIWA:  CIWA-Ar Total: 4 COWS:     Treatment Plan Summary: Daily contact with patient to assess and evaluate symptoms and progress in treatment Medication management  Plan: Supportive approach/coping skills/relapse prevention           CBT/mindfulness           Optimize treatment with psychotropics            Explore long term therapeutic placement  options Medical Decision Making Problem Points:  Review of psycho-social stressors (1) Data Points:  Review of medication regiment & side effects (2)  I certify that inpatient services furnished can reasonably be expected to improve the patient's condition.   Rosaly Labarbera A 08/05/2013, 4:57 PM

## 2013-08-05 NOTE — Progress Notes (Signed)
D    Pt is pleasant on approach  He denies suicidal ideation  He has some mild tremors and irritability but  no other signs and symptoms of withdrawal    He interacts well with others  A   Verbal support given   Medications administered and effectiveness monitored   Q 15 min checks R   Pt safe at present

## 2013-08-06 MED ORDER — CHLORDIAZEPOXIDE HCL 25 MG PO CAPS
25.0000 mg | ORAL_CAPSULE | Freq: Four times a day (QID) | ORAL | Status: AC | PRN
  Administered 2013-08-06 (×2): 25 mg via ORAL
  Filled 2013-08-06: qty 1

## 2013-08-06 MED ORDER — HYDROXYZINE HCL 25 MG PO TABS
25.0000 mg | ORAL_TABLET | Freq: Four times a day (QID) | ORAL | Status: DC | PRN
  Administered 2013-08-09: 25 mg via ORAL

## 2013-08-06 MED ORDER — CHLORDIAZEPOXIDE HCL 25 MG PO CAPS
25.0000 mg | ORAL_CAPSULE | Freq: Every day | ORAL | Status: DC
  Administered 2013-08-09: 25 mg via ORAL
  Filled 2013-08-06 (×2): qty 1

## 2013-08-06 MED ORDER — HYDROXYZINE HCL 25 MG PO TABS: 25.0000 mg | ORAL_TABLET | Freq: Four times a day (QID) | ORAL | Status: AC | PRN

## 2013-08-06 MED ORDER — CHLORDIAZEPOXIDE HCL 25 MG PO CAPS
25.0000 mg | ORAL_CAPSULE | Freq: Two times a day (BID) | ORAL | Status: AC
  Administered 2013-08-07 – 2013-08-08 (×3): 25 mg via ORAL
  Filled 2013-08-06 (×3): qty 1

## 2013-08-06 NOTE — [facility] Group Notes (Signed)
[facility] Group Notes:  (Nursing/MHT/Case Management/Adjunct)  Date:  08/06/2013  Time:  12:29 PM  Type of Therapy:  Psychoeducational Skills  Participation Level:  Active  Participation Quality:  Appropriate  Affect:  Appropriate  Cognitive:  Appropriate  Insight:  Appropriate  Engagement in Group:  Engaged  Modes of Intervention:  Problem-solving  Summary of Progress/Problems: Pt attended healthy coping skills group and engaged in treatment.    Jacquelyne Balint Shanta 08/06/2013, 12:29 PM

## 2013-08-06 NOTE — [facility] Group Notes (Addendum)
[facility] Group Notes:  (Clinical Social Work)  08/06/2013     10-11AM  Summary of Progress/Problems:   The main focus of today's process group was for the patient to identify ways in which they have in the past sabotaged their own recovery. Motivational Interviewing was utilized to ask the group members what they get out of their substance use, and what reasons they may have for wanting to change.  The Stages of Change were explained using a handout, and patients identified where they currently are with regard to stages of change.  The patient expressed that he self sabotages by telling himself that he is going to quit drinking for the rest of his life, so he deserves to have "one last drunk" which leads to another and another.  He has been drinking since age 52, a fullblown alcoholic, he states, since age 1.  He does have one period of sobriety for 8 years, and other times of 1 year, then 9 months, etc.  He has been using off and on since 2003 at this point.  He drinks beer first thing in the morning in the bathtub in order to function.  He states he wants a better life, that he has "no choice" because his father is elderly, his mother is deceased, and nobody is going to be there to bail him out.  He uses alcohol because he likes the taste, it gives him a feeling of euphoria, and helps him to forget his problems for awhile.  He states he cannot function with the alcohol, however, and with no income will be unable to survive unless he can stop drinking and get/keep a job.  He is in Preparation stage of change, states he is trying to decide where to go for treatment from here, had a lot of questions about [facility].  He will use AA, has a sponsor, and plans to go to meetings every day.  The patient smiled throughout group even when talking about painful things, and said this is to hide what is going on inside.  Type of Therapy:  Group Therapy - Process   Participation Level:  Active  Participation Quality:   Attentive and Sharing  Affect:  Not Congruent    Cognitive:  Oriented  Insight:  Developing/Improving  Engagement in Therapy:  Engaged  Modes of Intervention:  Education, Support and Processing, Motivational Interviewing  Ambrose Mantle, LCSW 08/06/2013, 12:07 PM

## 2013-08-06 NOTE — Progress Notes (Signed)
Patient did not attend the evening speaker AA meeting. Pt remained in bed during group.   

## 2013-08-06 NOTE — Progress Notes (Signed)
[facility] MD Progress Note  08/06/2013 3:47 PM Jeffrey Black  MRN:  [id number] Subjective:  Sleep fair, appetite good, depression continues but no suicidal ideations, anxiety.  52 year old daughter upset with him because he was drunk Thanksgiving, Christmas, and her birthday.  He is hoping to go to [facility] or [facility] and try to get in an [address] or half-way house to maintain his sobriety.  The half-way house is his hope, five guys there that he knows--would like to go back. Diagnosis:   DSM5:  Substance/Addictive Disorders:  Alcohol Withdrawal (291.81) Depressive Disorders:  Major Depressive Disorder - Severe (296.23)  Axis I: Alcohol Abuse, Anxiety Disorder NOS and Major Depression, single episode Axis II: Deferred Axis III:  Past Medical History  Diagnosis Date  . ETOH abuse   . Psoriasis   . Hypertension    Axis IV: other psychosocial or environmental problems, problems related to social environment and problems with primary support group Axis V: 41-50 serious symptoms  ADL's:  Intact  Sleep: Fair  Appetite:  Good  Suicidal Ideation:  Denies Homicidal Ideation:  Denies  Psychiatric Specialty Exam: Review of Systems  Constitutional: Negative.   HENT: Negative.   Eyes: Negative.   Respiratory: Negative.   Cardiovascular: Negative.   Gastrointestinal: Negative.   Genitourinary: Negative.   Musculoskeletal: Negative.   Skin: Negative.   Neurological: Negative.   Endo/Heme/Allergies: Negative.   Psychiatric/Behavioral: Positive for depression and substance abuse. The patient is nervous/anxious.     Blood pressure 136/96, pulse 66, temperature 97.4 F (36.3 C), temperature source Oral, resp. rate 16, height 6' (1.829 m), weight 92.08 kg (203 lb).Body mass index is 27.53 kg/(m^2).  General Appearance: Casual  Eye Contact::  Fair  Speech:  Normal Rate  Volume:  Normal  Mood:  Anxious and Depressed  Affect:  Congruent  Thought Process:  Coherent  Orientation:  Full  (Time, Place, and Person)  Thought Content:  WDL  Suicidal Thoughts:  No  Homicidal Thoughts:  No  Memory:  Immediate;   Fair Recent;   Fair Remote;   Fair  Judgement:  Poor  Insight:  Fair  Psychomotor Activity:  Decreased  Concentration:  Fair  Recall:  Fair  Akathisia:  No  Handed:  Right  AIMS (if indicated):     Assets:  Resilience Social Support  Sleep:  Number of Hours: 5.75   Current Medications: Current Facility-Administered Medications  Medication Dose Route Frequency Provider Last Rate Last Dose  . acetaminophen (TYLENOL) tablet 650 mg  650 mg Oral Q6H PRN Shuvon Rankin, NP      . alum & mag hydroxide-simeth (MAALOX/MYLANTA) 200-200-20 MG/5ML suspension 30 mL  30 mL Oral Q4H PRN Shuvon Rankin, NP   30 mL at 08/06/13 1147  . chlordiazePOXIDE (LIBRIUM) capsule 25 mg  25 mg Oral BID Verne Spurr, PA-C       Followed by  . [START ON 08/08/2013] chlordiazePOXIDE (LIBRIUM) capsule 25 mg  25 mg Oral Daily Verne Spurr, PA-C      . chlordiazePOXIDE (LIBRIUM) capsule 25 mg  25 mg Oral Q6H PRN Rachael Fee, MD      . escitalopram (LEXAPRO) tablet 20 mg  20 mg Oral Daily Shuvon Rankin, NP   20 mg at 08/06/13 0825  . gabapentin (NEURONTIN) capsule 400 mg  400 mg Oral TID Shuvon Rankin, NP   400 mg at 08/06/13 1147  . hydrOXYzine (ATARAX/VISTARIL) tablet 25 mg  25 mg Oral Q6H PRN Rachael Fee, MD      .  hydrOXYzine (ATARAX/VISTARIL) tablet 25 mg  25 mg Oral Q6H PRN Rachael Fee, MD      . influenza vac split quadrivalent PF (FLUARIX) injection 0.5 mL  0.5 mL Intramuscular Once Rachael Fee, MD      . magnesium hydroxide (MILK OF MAGNESIA) suspension 30 mL  30 mL Oral Daily PRN Shuvon Rankin, NP      . metoprolol (LOPRESSOR) tablet 50 mg  50 mg Oral BID Shuvon Rankin, NP   50 mg at 08/06/13 0825  . multivitamin with minerals tablet 1 tablet  1 tablet Oral Daily Shuvon Rankin, NP   1 tablet at 08/06/13 0825  . naproxen (NAPROSYN) tablet 250 mg  250 mg Oral BID WC Shuvon Rankin,  NP   250 mg at 08/06/13 0825  . nicotine (NICODERM CQ - dosed in mg/24 hours) patch 21 mg  21 mg Transdermal Daily Rachael Fee, MD   21 mg at 08/06/13 0826  . oxyCODONE (Oxy IR/ROXICODONE) immediate release tablet 5 mg  5 mg Oral Q4H PRN Shuvon Rankin, NP   5 mg at 08/05/13 2159  . QUEtiapine (SEROQUEL) tablet 200 mg  200 mg Oral QHS Shuvon Rankin, NP   200 mg at 08/05/13 2159  . QUEtiapine (SEROQUEL) tablet 50 mg  50 mg Oral BID Shuvon Rankin, NP   50 mg at 08/06/13 0826  . thiamine (VITAMIN B-1) tablet 100 mg  100 mg Oral Daily Shuvon Rankin, NP   100 mg at 08/06/13 0825    Lab Results: No results found for this or any previous visit (from the past 48 hour(s)).  Physical Findings: AIMS: Facial and Oral Movements Muscles of Facial Expression: None, normal Lips and Perioral Area: None, normal Jaw: None, normal Tongue: None, normal,Extremity Movements Upper (arms, wrists, hands, fingers): None, normal Lower (legs, knees, ankles, toes): None, normal, Trunk Movements Neck, shoulders, hips: None, normal, Overall Severity Severity of abnormal movements (highest score from questions above): None, normal Incapacitation due to abnormal movements: None, normal Patient's awareness of abnormal movements (rate only patient's report): No Awareness, Dental Status Current problems with teeth and/or dentures?: No Does patient usually wear dentures?: No  CIWA:  CIWA-Ar Total: 4 COWS:     Treatment Plan Summary: Daily contact with patient to assess and evaluate symptoms and progress in treatment Medication management  Plan:  Review of chart, vital signs, medications, and notes. 1-Individual and group therapy 2-Medication management for depression and anxiety:  Medications reviewed with the patient and he stated no untoward effects, no changes made 3-Coping skills for depression, anxiety, and pain 4-Continue crisis stabilization and management 5-Address health issues--monitoring vital signs,  elevated at times due to detox 6-Treatment plan in progress to prevent relapse of depression and anxiety  Medical Decision Making Problem Points:  Established problem, stable/improving (1) and Review of psycho-social stressors (1) Data Points:  Review of medication regiment & side effects (2)  I certify that inpatient services furnished can reasonably be expected to improve the patient's condition.   Nanine Means, PMH-NP 08/06/2013, 3:47 PM  Reviewed the information documented and agree with the treatment plan.  Berlin Mokry,JANARDHAHA R. 08/07/2013 3:50 PM

## 2013-08-06 NOTE — Progress Notes (Signed)
Met with pt 1:1 who is resting in bed. Pt complaining of back pain of a 5/10 and states it just started. Unsure as to cause. Pt is flat in affect, mood slightly anxious. He is pleasant and cooperative. Refusing AA group tonight. Pt encouraged to program. Offered support. He denies SI/HI/AVH and remains safe. Jeffrey Black

## 2013-08-06 NOTE — Progress Notes (Signed)
Patient ID: Jeffrey Black, male   DOB: March 05, 1961, 52 y.o.   MRN: [id number]  D: Patient lying in bed. Respirations even and non-labored. A: Staff will continue to monitor on q 15 minute checks, follow treatment plan, and give meds as ordered. R: Appears to be sleeping

## 2013-08-06 NOTE — Progress Notes (Signed)
[name] cont to handle his detox very well. HE is  cooperative and compliant, taking his meds as ordered and attending his groups. HE says he has an anxiety problem and speaks with this nurse about continuing to take librium, even though his detox has  Ended.Per MD order, his librium prn is extended 24 hrs.    A HE completes his AM self inventory and on it he writes he denies SI within the past 24hrs, he rates his depression and hopelessness " 8/7", and his DC plan is to " work AA thoroughly".    R  Safety is in place. Pt is medicated with prn librium and mylanta, per his request and poc cont.

## 2013-08-07 DIAGNOSIS — F1010 Alcohol abuse, uncomplicated: Secondary | ICD-10-CM

## 2013-08-07 DIAGNOSIS — F411 Generalized anxiety disorder: Secondary | ICD-10-CM

## 2013-08-07 DIAGNOSIS — F329 Major depressive disorder, single episode, unspecified: Secondary | ICD-10-CM

## 2013-08-07 NOTE — [facility] Group Notes (Signed)
[facility] Group Notes:  (Nursing/MHT/Case Management/Adjunct)  Date:  08/07/2013  Time:  3:00 PM  Type of Therapy:  Psychoeducational Skills  Participation Level:  Active  Participation Quality:  Appropriate  Affect:  Appropriate  Cognitive:  Appropriate  Insight:  Appropriate  Engagement in Group:  Engaged  Modes of Intervention:  Problem-solving  Summary of Progress/Problems: Pt attended healthy coping skills group, and engaged in treatment.    Jeffrey Black 08/07/2013, 3:00 PM 

## 2013-08-07 NOTE — Progress Notes (Signed)
Jeffrey Black is appropriate, he interacts well with his peers, he takes his meds as ordered, he is pleasant and coopeative.   A HE denies actual withdrawal symptoms but is mainly concerned with being able to sleep tonight.    R Safety is in place and POC moves forward.

## 2013-08-07 NOTE — Progress Notes (Signed)
Psychoeducational Group Note  Date:  08/07/2013 Time:  Group Topic/Focus:  Making Healthy Choices:   The focus of this group is to help patients identify negative/unhealthy attitudes they were using prior to admission and identify positive/healthier attitutdes to embrace upon discharge.  Participation Level:  Active  Participation Quality:  Appropriate  Affect:  Appropriate  Cognitive:  Oriented  Insight:  Engaged  Engagement in Group:  Engaged  Additional Comments:    Rich Brave 2:40 PM. 08/07/2013

## 2013-08-07 NOTE — Progress Notes (Signed)
Patient ID: Jeffrey Black, male   DOB: 04-03-1961, 52 y.o.   MRN: [id number] [facility] MD Progress Note  08/07/2013 4:43 PM Jeffrey Black  MRN:  [id number]  Subjective:  Jeffrey Black reports feeling a lot better. A little distressed now that his detox treatment is about to complete. Does not know about where to go from here after discharge. One plan is to move back to the [place] house and or just move to [place] and look for work there. Is still having some tremors. Continue to toss and turn at night. Denies any SIHI.  Diagnosis:   DSM5: Substance/Addictive Disorders:  Alcohol Withdrawal (291.81) Depressive Disorders:  Major Depressive Disorder - Severe (296.23)  Axis I: Alcohol Abuse, Anxiety Disorder NOS and Major Depression, single episode Axis II: Deferred Axis III:  Past Medical History  Diagnosis Date  . ETOH abuse   . Psoriasis   . Hypertension    Axis IV: other psychosocial or environmental problems, problems related to social environment and problems with primary support group Axis V: 41-50 serious symptoms  ADL's:  Intact  Sleep: Fair  Appetite:  Good  Suicidal Ideation:  Denies Homicidal Ideation:  Denies  Psychiatric Specialty Exam: Review of Systems  Constitutional: Negative.   HENT: Negative.   Eyes: Negative.   Respiratory: Negative.   Cardiovascular: Negative.   Gastrointestinal: Negative.   Genitourinary: Negative.   Musculoskeletal: Negative.   Skin: Negative.   Neurological: Negative.   Endo/Heme/Allergies: Negative.   Psychiatric/Behavioral: Positive for depression and substance abuse. The patient is nervous/anxious.     Blood pressure 143/94, pulse 69, temperature 97.3 F (36.3 C), temperature source Oral, resp. rate 16, height 6' (1.829 m), weight 92.08 kg (203 lb).Body mass index is 27.53 kg/(m^2).  General Appearance: Casual  Eye Contact::  Fair  Speech:  Normal Rate  Volume:  Normal  Mood:  Anxious and Depressed  Affect:   Congruent  Thought Process:  Coherent  Orientation:  Full (Time, Place, and Person)  Thought Content:  WDL  Suicidal Thoughts:  No  Homicidal Thoughts:  No  Memory:  Immediate;   Fair Recent;   Fair Remote;   Fair  Judgement:  Poor  Insight:  Fair  Psychomotor Activity:  Decreased  Concentration:  Fair  Recall:  Fair  Akathisia:  No  Handed:  Right  AIMS (if indicated):     Assets:  Resilience Social Support  Sleep:  Number of Hours: 4.25   Current Medications: Current Facility-Administered Medications  Medication Dose Route Frequency Provider Last Rate Last Dose  . acetaminophen (TYLENOL) tablet 650 mg  650 mg Oral Q6H PRN Shuvon Rankin, NP      . alum & mag hydroxide-simeth (MAALOX/MYLANTA) 200-200-20 MG/5ML suspension 30 mL  30 mL Oral Q4H PRN Shuvon Rankin, NP   30 mL at 08/07/13 0902  . chlordiazePOXIDE (LIBRIUM) capsule 25 mg  25 mg Oral BID Verne Spurr, PA-C   25 mg at 08/07/13 0739   Followed by  . [START ON 08/08/2013] chlordiazePOXIDE (LIBRIUM) capsule 25 mg  25 mg Oral Daily Verne Spurr, PA-C      . chlordiazePOXIDE (LIBRIUM) capsule 25 mg  25 mg Oral Q6H PRN Rachael Fee, MD   25 mg at 08/06/13 2241  . escitalopram (LEXAPRO) tablet 20 mg  20 mg Oral Daily Shuvon Rankin, NP   20 mg at 08/07/13 0738  . gabapentin (NEURONTIN) capsule 400 mg  400 mg Oral TID Shuvon Rankin, NP   400  mg at 08/07/13 1300  . hydrOXYzine (ATARAX/VISTARIL) tablet 25 mg  25 mg Oral Q6H PRN Rachael Fee, MD      . hydrOXYzine (ATARAX/VISTARIL) tablet 25 mg  25 mg Oral Q6H PRN Rachael Fee, MD      . influenza vac split quadrivalent PF (FLUARIX) injection 0.5 mL  0.5 mL Intramuscular Once Rachael Fee, MD      . magnesium hydroxide (MILK OF MAGNESIA) suspension 30 mL  30 mL Oral Daily PRN Shuvon Rankin, NP      . metoprolol (LOPRESSOR) tablet 50 mg  50 mg Oral BID Shuvon Rankin, NP   50 mg at 08/07/13 0738  . multivitamin with minerals tablet 1 tablet  1 tablet Oral Daily Shuvon Rankin, NP    1 tablet at 08/07/13 0737  . naproxen (NAPROSYN) tablet 250 mg  250 mg Oral BID WC Shuvon Rankin, NP   250 mg at 08/07/13 0737  . nicotine (NICODERM CQ - dosed in mg/24 hours) patch 21 mg  21 mg Transdermal Daily Rachael Fee, MD   21 mg at 08/07/13 0740  . oxyCODONE (Oxy IR/ROXICODONE) immediate release tablet 5 mg  5 mg Oral Q4H PRN Shuvon Rankin, NP   5 mg at 08/06/13 2241  . QUEtiapine (SEROQUEL) tablet 200 mg  200 mg Oral QHS Shuvon Rankin, NP   200 mg at 08/06/13 2239  . QUEtiapine (SEROQUEL) tablet 50 mg  50 mg Oral BID Shuvon Rankin, NP   50 mg at 08/07/13 0737  . thiamine (VITAMIN B-1) tablet 100 mg  100 mg Oral Daily Shuvon Rankin, NP   100 mg at 08/07/13 1610    Lab Results: No results found for this or any previous visit (from the past 48 hour(s)).  Physical Findings: AIMS: Facial and Oral Movements Muscles of Facial Expression: None, normal Lips and Perioral Area: None, normal Jaw: None, normal Tongue: None, normal,Extremity Movements Upper (arms, wrists, hands, fingers): None, normal Lower (legs, knees, ankles, toes): None, normal, Trunk Movements Neck, shoulders, hips: None, normal, Overall Severity Severity of abnormal movements (highest score from questions above): None, normal Incapacitation due to abnormal movements: None, normal Patient's awareness of abnormal movements (rate only patient's report): No Awareness, Dental Status Current problems with teeth and/or dentures?: No Does patient usually wear dentures?: No  CIWA:  CIWA-Ar Total: 3 COWS:     Treatment Plan Summary: Daily contact with patient to assess and evaluate symptoms and progress in treatment Medication management  Plan: Supportive approach/coping skills/relapse prevention. Encouraged out of room, participation in group sessions and application of coping skills when distressed. Will continue to monitor response to/adverse effects of medications in use to assure effectiveness. Continue to monitor  mood, behavior and interaction with staff and other patients. Continue current plan of care.  Medical Decision Making Problem Points:  Established problem, stable/improving (1) and Review of psycho-social stressors (1) Data Points:  Review of medication regiment & side effects (2)  I certify that inpatient services furnished can reasonably be expected to improve the patient's condition.   Armandina Stammer I, PMH-NP 08/07/2013, 4:43 PM

## 2013-08-07 NOTE — Progress Notes (Signed)
Patient did attend the evening speaker AA meeting.  

## 2013-08-07 NOTE — Progress Notes (Signed)
Adult Psychoeducational Group Note  Date:  08/07/2013 Time:  6:23 PM  Group Topic/Focus: [facility] Group Notes:  (Nursing/MHT/Case Management/Adjunct)  Date:  08/07/2013  Time:  6:24 PM  Type of Therapy:  Psychoeducational Skills  Participation Level:  Active  Participation Quality:  Appropriate and Sharing  Affect:  Appropriate  Cognitive:  Appropriate  Insight:  Appropriate  Engagement in Group:  Engaged  Modes of Intervention:  Activity, Socialization and Support  Summary of Progress/Problems: Pt stated he wanted to add having more supportive friends to his support system.  Caswell Corwin 08/07/2013, 6:24 PM

## 2013-08-07 NOTE — Progress Notes (Signed)
Pt is flat, depressed and anxious though states his day has been "okay." Continues to complain of sternum pain and rates it as a 5/10. "I'm in too much pain to go to AA. I've been there. I already know about it." Offered support. Encouraged to attend AA even if for a short time. Medicated with tylenol. Will reassess pain within the hour. Denies SI/HI/AVH and remains safe. Lawrence Marseilles

## 2013-08-07 NOTE — [facility] Group Notes (Signed)
[facility] Group Notes:  (Clinical Social Work)  08/07/2013  10:00-11:00AM  Summary of Progress/Problems:   The main focus of today's process group was to   identify the patient's current support system and decide on other supports that can be put in place.  The picture on workbook was used to discuss why additional supports are needed, and a hand-out was distributed with four definitions/levels of support, then used to talk about how patients have given and received all different kinds of support.  An emphasis was placed on using counselor, doctor, therapy groups, 12-step groups, and problem-specific support groups to expand supports.  The patient identified one additional support as being God/prayer, working the program steps.  He is also willing to consider adding a therapist.  Type of Therapy:  Process Group with Motivational Interviewing  Participation Level:  Active  Participation Quality:  Attentive  Affect:  Appropriate  Cognitive:  Oriented  Insight:  Developing/Improving  Engagement in Therapy:  Developing/Improving  Modes of Intervention:   Education, Support and Processing, Activity  Ambrose Mantle, LCSW 08/07/2013, 12:54 PM

## 2013-08-07 NOTE — Progress Notes (Signed)
Psychoeducational Group Note  Date: 08/06/13   Time: 115  Group Topic/Focus:  Identifying Needs:   The focus of this group is to help patients identify their personal needs that have been historically problematic and identify healthy behaviors to address their needs.  Participation Level:  Did Not Attend  Participation Quality:    Affect:    Cognitive:    Insight:    Engagement in Group:    Additional Comments:   08/06/13 PD RN [facility]

## 2013-08-08 NOTE — Progress Notes (Signed)
Adult Psychoeducational Group Note  Date:  08/08/2013 Time:  8:44 PM  Group Topic/Focus:  pt attended group  Participation Level:  Active  Participation Quality:  Appropriate  Affect:  Appropriate  Cognitive:  Appropriate  Insight: Appropriate  Engagement in Group:  Developing/Improving  Modes of Intervention:  pt attended group  Additional Comments:  Pt attended group   Shanikia Kernodle 08/08/2013, 8:44 PM

## 2013-08-08 NOTE — Progress Notes (Signed)
Adult Psychoeducational Group Note  Date:  08/08/2013 Time:  1:34 PM  Group Topic/Focus:  Self Care:   The focus of this group is to help patients understand the importance of self-care in order to improve or restore emotional, physical, spiritual, interpersonal, and financial health.  Participation Level:  Did Not Attend  Cathlean Cower 08/08/2013, 1:34 PM

## 2013-08-08 NOTE — [facility] Group Notes (Signed)
[facility] LCSW Aftercare Discharge Planning Group Note   08/08/2013 9:51 AM  Participation Quality:  Appropriate   Mood/Affect:  Appropriate  Depression Rating:  7  Anxiety Rating:  8  Thoughts of Suicide:  No Will you contract for safety?   NA  Current AVH:  No  Plan for Discharge/Comments:  Pt reports that he is feeling "sort of better today." He is still unsure about what he would like in terms of aftercare. Pt thinking about [address], [place], or returning to halfway house. Pt eventually plans to move to [place] in order to "get a new start."   Transportation Means: Unknown at this time.   Supports: sister and Armed forces operational officer, Research scientist (physical sciences)

## 2013-08-08 NOTE — Progress Notes (Signed)
Chaplain provided extended follow up support with pt around discharge, integration of spirituality / faith in recovery.

## 2013-08-08 NOTE — Progress Notes (Addendum)
Patient ID: Jeffrey Black, male   DOB: August 02, 1961, 52 y.o.   MRN: [id number] Patient ID: Jeffrey Black, male   DOB: March 05, 1961, 52 y.o.   MRN: [id number] [facility] MD Progress Note  08/08/2013 2:04 PM LAVORIS SPARLING  MRN:  [id number]  Subjective:  Dawon continue to endorse some tremors to bilateral hands.Says he sleeps fairly, still tossing and turning. He says he feels depressed. Rated his depression at #7. He adds that his biggest challenge remains where [place] after his hospital stay. Is endorsing passive SI off and on. No plans and or intent to himself and or others.  Diagnosis:   DSM5: Substance/Addictive Disorders:  Alcohol Withdrawal (291.81) Depressive Disorders:  Major Depressive Disorder - Severe (296.23)  Axis I: Alcohol Abuse, Anxiety Disorder NOS and Major Depression, single episode Axis II: Deferred Axis III:  Past Medical History  Diagnosis Date  . ETOH abuse   . Psoriasis   . Hypertension    Axis IV: other psychosocial or environmental problems, problems related to social environment and problems with primary support group Axis V: 41-50 serious symptoms  ADL's:  Intact  Sleep: Fair  Appetite:  Good  Suicidal Ideation:  Denies Homicidal Ideation:  Denies  Psychiatric Specialty Exam: Review of Systems  Constitutional: Negative.   HENT: Negative.   Eyes: Negative.   Respiratory: Negative.   Cardiovascular: Negative.   Gastrointestinal: Negative.   Genitourinary: Negative.   Musculoskeletal: Negative.   Skin: Negative.   Neurological: Negative.   Endo/Heme/Allergies: Negative.   Psychiatric/Behavioral: Positive for depression and substance abuse. The patient is nervous/anxious.     Blood pressure 140/100, pulse 66, temperature 97.7 F (36.5 C), temperature source Oral, resp. rate 20, height 6' (1.829 m), weight 92.08 kg (203 lb).Body mass index is 27.53 kg/(m^2).  General Appearance: Casual  Eye Contact::  Fair  Speech:  Normal Rate  Volume:  Normal   Mood:  Anxious and Depressed  Affect:  Congruent  Thought Process:  Coherent  Orientation:  Full (Time, Place, and Person)  Thought Content:  WDL  Suicidal Thoughts:  No  Homicidal Thoughts:  No  Memory:  Immediate;   Fair Recent;   Fair Remote;   Fair  Judgement:  Poor  Insight:  Fair  Psychomotor Activity:  Decreased  Concentration:  Fair  Recall:  Fair  Akathisia:  No  Handed:  Right  AIMS (if indicated):     Assets:  Resilience Social Support  Sleep:  Number of Hours: 5.5   Current Medications: Current Facility-Administered Medications  Medication Dose Route Frequency Provider Last Rate Last Dose  . acetaminophen (TYLENOL) tablet 650 mg  650 mg Oral Q6H PRN Shuvon Rankin, NP   650 mg at 08/07/13 1947  . alum & mag hydroxide-simeth (MAALOX/MYLANTA) 200-200-20 MG/5ML suspension 30 mL  30 mL Oral Q4H PRN Shuvon Rankin, NP   30 mL at 08/07/13 0902  . chlordiazePOXIDE (LIBRIUM) capsule 25 mg  25 mg Oral Daily Verne Spurr, PA-C      . escitalopram (LEXAPRO) tablet 20 mg  20 mg Oral Daily Shuvon Rankin, NP   20 mg at 08/08/13 0801  . gabapentin (NEURONTIN) capsule 400 mg  400 mg Oral TID Shuvon Rankin, NP   400 mg at 08/08/13 1142  . hydrOXYzine (ATARAX/VISTARIL) tablet 25 mg  25 mg Oral Q6H PRN Rachael Fee, MD      . influenza vac split quadrivalent PF (FLUARIX) injection 0.5 mL  0.5 mL Intramuscular Once Rachael Fee, MD      .  magnesium hydroxide (MILK OF MAGNESIA) suspension 30 mL  30 mL Oral Daily PRN Shuvon Rankin, NP      . metoprolol (LOPRESSOR) tablet 50 mg  50 mg Oral BID Shuvon Rankin, NP   50 mg at 08/08/13 0801  . multivitamin with minerals tablet 1 tablet  1 tablet Oral Daily Shuvon Rankin, NP   1 tablet at 08/08/13 0801  . naproxen (NAPROSYN) tablet 250 mg  250 mg Oral BID WC Shuvon Rankin, NP   250 mg at 08/08/13 0801  . nicotine (NICODERM CQ - dosed in mg/24 hours) patch 21 mg  21 mg Transdermal Daily Rachael Fee, MD   21 mg at 08/08/13 0801  . oxyCODONE  (Oxy IR/ROXICODONE) immediate release tablet 5 mg  5 mg Oral Q4H PRN Shuvon Rankin, NP   5 mg at 08/07/13 2221  . QUEtiapine (SEROQUEL) tablet 200 mg  200 mg Oral QHS Shuvon Rankin, NP   200 mg at 08/07/13 2222  . QUEtiapine (SEROQUEL) tablet 50 mg  50 mg Oral BID Shuvon Rankin, NP   50 mg at 08/08/13 0801  . thiamine (VITAMIN B-1) tablet 100 mg  100 mg Oral Daily Shuvon Rankin, NP   100 mg at 08/08/13 9604    Lab Results: No results found for this or any previous visit (from the past 48 hour(s)).  Physical Findings: AIMS: Facial and Oral Movements Muscles of Facial Expression: None, normal Lips and Perioral Area: None, normal Jaw: None, normal Tongue: None, normal,Extremity Movements Upper (arms, wrists, hands, fingers): None, normal Lower (legs, knees, ankles, toes): None, normal, Trunk Movements Neck, shoulders, hips: None, normal, Overall Severity Severity of abnormal movements (highest score from questions above): None, normal Incapacitation due to abnormal movements: None, normal Patient's awareness of abnormal movements (rate only patient's report): No Awareness, Dental Status Current problems with teeth and/or dentures?: No Does patient usually wear dentures?: No  CIWA:  CIWA-Ar Total: 2 COWS:     Treatment Plan Summary: Daily contact with patient to assess and evaluate symptoms and progress in treatment Medication management  Plan: Supportive approach/coping skills/relapse prevention. Encouraged out of room, participation in group sessions and application of coping skills when distressed. Will continue to monitor response to/adverse effects of medications in use to assure effectiveness. Continue to monitor mood, behavior and interaction with staff and other patients. Discharge plans in progress. Continue current plan of care.  Medical Decision Making Problem Points:  Established problem, stable/improving (1) and Review of psycho-social stressors (1) Data Points:  Review  of medication regiment & side effects (2)  I certify that inpatient services furnished can reasonably be expected to improve the patient's condition.   Armandina Stammer I, PMH-NP 08/08/2013, 2:04 PM

## 2013-08-08 NOTE — Progress Notes (Signed)
Spoke with pt 1:1 who is flat, depressed but per his report is overall improved. Pt seems apprehensive about staying clean though says he is committed. "I have to literally take it one day at a time. I plan to go to a halfway house until I can figure out my long range plans. I really hit rock bottom this time. Being hauled away from Extended Stay by 4 cops and being told that I can never return is pretty bad." Pt supported, encouraged and praised for insight. Medicated per orders. Continues to request roxicodone only at night time to aid in pain relief of injured sternum. States moving in bed during sleep is painful. He denies SI - I have thought about it in the past but not now - HI/AVH and remains safe. Lawrence Marseilles

## 2013-08-08 NOTE — [facility] Group Notes (Signed)
[facility] LCSW Group Therapy  08/08/2013 3:09 PM  Type of Therapy:  Group Therapy  Participation Level:  Active  Participation Quality:  Attentive  Affect:  Appropriate  Cognitive:  Alert and Oriented  Insight:  Engaged  Engagement in Therapy:  Engaged  Modes of Intervention:  Confrontation, Discussion, Education, Exploration, Socialization and Support  Summary of Progress/Problems: Today's Topic: Overcoming Obstacles. Pt identified obstacles faced currently and processed barriers involved in overcoming these obstacles. Pt identified steps necessary for overcoming these obstacles and explored motivation (internal and external) for facing these difficulties head on. Pt further identified one area of concern in their lives and chose a skill of focus pulled from their "toolbox." Yaron was attentive and engaged throughout today's group therapy. He identified "finding a job" as his primary obstacle and "staying alcohol free" as his secondary obstacle. Jamerson stated that finding a job was not an issue, but rather, finding a job that he would be passionate about was problem. Traycen explored how he can work on finding a job that brings him joy and a sense of purpose. He also explored ways to remain sober and combat cravings while not inpatient. Jaiel shows improving insight and progress in the group setting AEB his ability to explore the possibility of inpatient treatment as a necessity for strengthening his willpower and coping skills. "I know what I need to do, I just lose the willpower to do it sometimes. I think I may need inpatient treatment in order to build that up again."     Smart, Anajah Sterbenz 08/08/2013, 3:09 PM

## 2013-08-08 NOTE — Tx Team (Signed)
Interdisciplinary Treatment Plan Update (Adult)  Date: 08/08/2013   Time Reviewed: 11:26 AM  Progress in Treatment:  Attending groups: Yes Participating in groups:  Minimally.  Taking medication as prescribed: Yes  Tolerating medication: Yes  Family/Significant othe contact made: SPE completed with pt's sister on 9/18.  Patient understands diagnosis: Yes, AEB seeking treatment for ETOH detox, depression, and medication management.  Discussing patient identified problems/goals with staff: Yes  Medical problems stabilized or resolved: Yes  Denies suicidal/homicidal ideation: Yes, during group/self report.  Patient has not harmed self or Others: Yes  New problem(s) identified: Pt resistant to deciding upon d/c plan. Discharge Plan or Barriers: Pt interested in [facility] but currently has [facility] [employer]. Pt given information to several recovery houses. No beds available at [facility]. [facility] are options for o/p follow up. Pt able to return to halfway house and is also looking into [facility] [employer]  Additional comments: n/a  Librium taper-withdrawals Mood stabilization Medication management  Estimated length of stay: 1-2 days   For review of initial/current patient goals, please see plan of care.  Attendees:  Patient:    Family:    Physician: Geoffery Lyons MD 08/08/2013 11:26 AM   Nursing: Lupita Leash RN  08/08/2013 11:26 AM   Clinical Social Worker Stiven Kaspar Smart, LCSWA  08/08/2013 11:26 AM   Other: Chandra Batch. PA  08/08/2013 11:26 AM  Other: Darden Dates Nurse CM 08/08/2013 11:26 AM   Other: Sue Lush RN 08/08/2013 11:27 AM   Other:    Scribe for Treatment Team:  [employer] LCSWA  08/08/2013 11:26 AM

## 2013-08-08 NOTE — Progress Notes (Signed)
Patient ID: Jeffrey Black, male   DOB: 11-18-1960, 52 y.o.   MRN: [id number] He has been up and to groups today interacting with peers and staff. When you ask him if he is having thoughts to harm self he just looks at you and grins. Depression and hopelessness at 7, Has SI of and off per self inventory.

## 2013-08-09 DIAGNOSIS — F1020 Alcohol dependence, uncomplicated: Secondary | ICD-10-CM

## 2013-08-09 MED ORDER — METOPROLOL TARTRATE 50 MG PO TABS: 50.0000 mg | ORAL_TABLET | Freq: Two times a day (BID) | ORAL | Status: DC

## 2013-08-09 MED ORDER — GABAPENTIN 400 MG PO CAPS: 400.0000 mg | ORAL_CAPSULE | Freq: Three times a day (TID) | ORAL | Status: DC

## 2013-08-09 MED ORDER — OXYCODONE HCL 5 MG PO TABS: 5.0000 mg | ORAL_TABLET | ORAL | Status: DC | PRN

## 2013-08-09 MED ORDER — NAPROXEN SODIUM 220 MG PO TABS: 220.0000 mg | ORAL_TABLET | Freq: Two times a day (BID) | ORAL | Status: DC

## 2013-08-09 MED ORDER — ESCITALOPRAM OXALATE 20 MG PO TABS: 20.0000 mg | ORAL_TABLET | Freq: Every day | ORAL | Status: DC

## 2013-08-09 MED ORDER — ADULT MULTIVITAMIN W/MINERALS CH: 1.0000 | ORAL_TABLET | Freq: Every day | ORAL | Status: DC

## 2013-08-09 MED ORDER — QUETIAPINE FUMARATE 50 MG PO TABS: 50.0000 mg | ORAL_TABLET | Freq: Two times a day (BID) | ORAL | Status: DC

## 2013-08-09 MED ORDER — QUETIAPINE FUMARATE 200 MG PO TABS: 200.0000 mg | ORAL_TABLET | Freq: Every day | ORAL | Status: DC

## 2013-08-09 NOTE — Discharge Summary (Signed)
Physician Discharge Summary Note  Patient:  Jeffrey Black is an 52 y.o., male MRN:  [id number] DOB:  December 22, 1960 Patient phone:  [phone] (home)  Patient address:   [address] [place] [address],   Date of Admission:  08/03/2013 Date of Discharge: 08/09/13  Reason for Admission: Alcohol detox  Discharge Diagnoses: Principal Problem:   Alcoholism /alcohol abuse Active Problems:   Alcohol withdrawal syndrome   Depression   Alcohol dependence with withdrawal  Review of Systems  Constitutional: Negative.   HENT: Negative.   Eyes: Negative.   Respiratory: Negative.   Cardiovascular: Negative.   Gastrointestinal: Negative.   Genitourinary: Negative.   Musculoskeletal: Negative.   Skin: Negative.   Neurological: Negative.   Endo/Heme/Allergies: Negative.   Psychiatric/Behavioral: Positive for depression (Stabilized with medication prior to discharge) and substance abuse (Alcoholism). Negative for suicidal ideas, hallucinations and memory loss. The patient is nervous/anxious (Stabilized with medication prior to discharge) and has insomnia (Stabilized with medication prior to discharge).     DSM5: Schizophrenia Disorders:  NA Obsessive-Compulsive Disorders:  NA Trauma-Stressor Disorders:  NA Substance/Addictive Disorders:  Alcohol dependence Depressive Disorders:  Major Depressive Disorder - Moderate (296.22)  Axis Diagnosis:   AXIS I:   Alcohol dependence, Major depression AXIS II:  Deferred AXIS III:   Past Medical History  Diagnosis Date  . ETOH abuse   . Psoriasis   . Hypertension    AXIS IV:  other psychosocial or environmental problems and Alcoholism AXIS V:  63  Level of Care:  OP  Hospital Course: This is an admission assessment for this 52 year old Caucasian male. Ad[facility] with complaints of alcohol intoxication/withrawal symptoms requesting detox treatment. Patient reports, "The police escorted me to the  hospital 2 days ago. I was living in an extended stay motel. It was 2 days ago when the manager of the motel wanted me to pay for the week on that day. I wanted to pay later because I was too drunk to do anything concrete at the time. I wanted all the alcohol in me to come down a little so that I can get myself together. The manager did not want to wait. She wanted the money right then. When I did not do as she had hoped, she got mad and called the cops.The cops came, realized that I was wasted decided to take me to the hospital instead. I have been drinking heavily and daily x 6 weeks. I drink 2 (half gallons) of ever clear and 3-4 bottles of vodka in 3 days. I'm absolutely an alcoholic since 1991. Now, I have been sober for 8 years and 3 months from 1995 -2003. Alcohol gives me euphoria, it relieves my anxiety and loneliness. It has also gotten me into a big problem with the law. I have a pending DUI with careless and reckless driving charge 1 month ago. I also lost my driver's license as well".  Upon admission into this hospital, and after admission assessment/evaluation coupled with UDS/Toxicology reports showing blood alcohol levels at 363 and (+) Benzodiazepines, it was determined that [name] will need detoxification treatment protocol to stabilize his systems of drug intoxication and to combat the withdrawal symptoms of these substances as well.  [name] was then started on Librium detoxification treatment protocols. He was also enrolled in group counseling sessions and activities where he was counseled and learned coping skills that should help him after discharge to cope better and manage his  substance abuse issues to sustain a much longer sobriety. He also attended AA/NA meetings being offered and held on this unit. He presented with some other previously existing and or identifiable medical conditions that required treatment and or monitoring. He received medication management for all those  health issues as well. He was monitored closely for any potential problems that may arise as a result of and or during detoxification treatment. Patient tolerated his treatment regimen and detoxification treatment protocol without any significant adverse effects and or reactions presented.  Besides the detoxification treatment protocols, Mr. Betten also was ordered and received Lexapro 20 mg daily for depression, Gabapentin 400 mg tid for anxiety/pain management and Seroquel 50 mg bid for anxiety and 200 mg Q bedtime for mood control. Patient attended treatment team meeting this am and met with the treatment team members. His reason for admission, present symptoms, substance abuse issues, response to treatment and discharge plans discussed. Patient endorsed that he is doing well and stable for discharge to pursue the next phase of his substance abuse treatment. It was then agreed upon that he will follow-up care at the [facility] psychiatric clinic here in [place], [place]. He has been informed that this is a walk-in appointment between the hours of 08-09:00 am, Monday thru Friday.  Upon discharge, patient adamantly denies suicidal, homicidal ideations, auditory, visual hallucinations, delusional thoughts and or withdrawal symptoms. Patient left [facility] with all personal belongings in no apparent distress. She received 2 weeks worth samples of his discharge medications provided by [facility]. Transportation per Interior and spatial designer of the half way house.   Consults:  psychiatry  Significant Diagnostic Studies:  labs: CBC with diff, CMP, UDS, Toxicology tests, U/A  Discharge Vitals:   Blood pressure 130/84, pulse 75, temperature 97.2 F (36.2 C), temperature source Oral, resp. rate 18, height 6' (1.829 m), weight 92.08 kg (203 lb). Body mass index is 27.53 kg/(m^2). Lab Results:   No results found for this or any previous visit (from the past 72 hour(s)).  Physical Findings: AIMS: Facial and Oral Movements Muscles  of Facial Expression: None, normal Lips and Perioral Area: None, normal Jaw: None, normal Tongue: None, normal,Extremity Movements Upper (arms, wrists, hands, fingers): None, normal Lower (legs, knees, ankles, toes): None, normal, Trunk Movements Neck, shoulders, hips: None, normal, Overall Severity Severity of abnormal movements (highest score from questions above): None, normal Incapacitation due to abnormal movements: None, normal Patient's awareness of abnormal movements (rate only patient's report): No Awareness, Dental Status Current problems with teeth and/or dentures?: No Does patient usually wear dentures?: No  CIWA:  CIWA-Ar Total: 2 COWS:     Psychiatric Specialty Exam: See Psychiatric Specialty Exam and Suicide Risk Assessment completed by Attending Physician prior to discharge.  Discharge destination:  Home  Is patient on multiple antipsychotic therapies at discharge:  No   Has Patient had three or more failed trials of antipsychotic monotherapy by history:  No  Recommended Plan for Multiple Antipsychotic Therapies: NA     Medication List    STOP taking these medications       ibuprofen 200 MG tablet  Commonly known as:  ADVIL,MOTRIN      TAKE these medications     Indication   escitalopram 20 MG tablet  Commonly known as:  LEXAPRO  Take 1 tablet (20 mg total) by mouth daily. For depression   Indication:  Depression, Generalized Anxiety Disorder     gabapentin 400 MG capsule  Commonly known as:  NEURONTIN  Take 1 capsule (  400 mg total) by mouth 3 (three) times daily.   Indication:  Aggressive Behavior, Agitation, Anxiety     metoprolol 50 MG tablet  Commonly known as:  LOPRESSOR  Take 1 tablet (50 mg total) by mouth 2 (two) times daily. For hypertension   Indication:  High Blood Pressure     multivitamin with minerals Tabs tablet  Take 1 tablet by mouth daily. For low vitamin   Indication:  Low vitamin     naproxen sodium 220 MG tablet  Commonly  known as:  ANAPROX  Take 1 tablet (220 mg total) by mouth 2 (two) times daily with a meal. For pain   Indication:  Modearte Pain     oxyCODONE 5 MG immediate release tablet  Commonly known as:  Oxy IR/ROXICODONE  Take 1-2 tablets (5-10 mg total) by mouth every 4 (four) hours as needed. For severe pain   Indication:  Moderate to Severe Pain     QUEtiapine 200 MG tablet  Commonly known as:  SEROQUEL  Take 1 tablet (200 mg total) by mouth at bedtime. For mood control   Indication:  Trouble Sleeping, Manic Phase of Manic-Depression, Mood control     QUEtiapine 50 MG tablet  Commonly known as:  SEROQUEL  Take 1 tablet (50 mg total) by mouth 2 (two) times daily. For anxiety/mood control   Indication:  Manic Phase of Manic-Depression, Anxiety       Follow-up Information   Follow up with [facility]. (Walk in between 8AM-9AM Monday through Friday for hospital followup/medication management/assessment for therapy services. )    Contact information:   [address] [place] [address] Phone: [phone] Fax: [phone]     Follow-up recommendations:  Activity:  As tolerated Diet: As recommended by your primary care doctor. Keep all scheduled follow-up appointments as recommended.  Continue to work your relapse prevention plan Comments: Take all your medications as prescribed by your mental healthcare provider. Report any adverse effects and or reactions from your medicines to your outpatient provider promptly. Patient is instructed and cautioned to not engage in alcohol and or illegal drug use while on prescription medicines. In the event of worsening symptoms, patient is instructed to call the crisis hotline, 911 and or go to the nearest ED for appropriate evaluation and treatment of symptoms. Follow-up with your primary care provider for your other medical issues, concerns and or health care needs.    Total Discharge Time:  Greater than 30 minutes.  Signed: Sanjuana Kava,  PMHNP-BC 08/15/2013, 4:24 PM Agree with assessment and plan Reymundo Poll. Dub Mikes, M.D.

## 2013-08-09 NOTE — Progress Notes (Signed)
[facility] Adult Case Management Discharge Plan :  Will you be returning to the same living situation after discharge: Yes,  returning to halfway house At discharge, do you have transportation home?:Yes,  owner of halfway house to pick up pt this evening.  Do you have the ability to pay for your medications:Yes,  MedPay expires Oct 1st. pt asked to follow up at [facility] and asked to make his own [facility] screening appt after MedPay expires in the next few days.   Release of information consent forms completed and in the chart;  Patient's signature needed at discharge.  Patient to Follow up at: Follow-up Information   Follow up with [facility]. (Walk in between 8AM-9AM Monday through Friday for hospital followup/medication management/assessment for therapy services. )    Contact information:   [address], [place] [address] Phone: [phone] Fax: [phone]      Patient denies SI/HI:   Yes,  in group/self report.     Safety Planning and Suicide Prevention discussed:  Yes,  SPE completed with pt's sister. Pt given SPI pamphlet and encouraged to share this information with his support network.   Black, Jeffrey Kamel 08/09/2013, 9:32 AM

## 2013-08-09 NOTE — Progress Notes (Signed)
Adult Psychoeducational Group Note  Date:  08/09/2013 Time:  1:27 PM  Group Topic/Focus:  Recovery Goals:   The focus of this group is to identify appropriate goals for recovery and establish a plan to achieve them.  Participation Level:  Did Not Attend  Jeffrey Black 08/09/2013, 1:27 PM

## 2013-08-09 NOTE — [facility] Suicide Risk Assessment (Signed)
Suicide Risk Assessment  Discharge Assessment     Demographic Factors:  Male and Caucasian  Mental Status Per Nursing Assessment::   On Admission:  NA  Current Mental Status by Physician: In full contact with reality. There are no suicidal ideas, plans or intent. There is no acute withdrawal. His mood is euthymic, his affect is appropriate. He is going to be able to go back to the half way house he was in before. He says he is committed to abstinence. Longer term he is [place] getting out the country.    Loss Factors: NA  Historical Factors: NA  Risk Reduction Factors:   wanting to do better  Continued Clinical Symptoms:  Depression:   Comorbid alcohol abuse/dependence Alcohol/Substance Abuse/Dependencies  Cognitive Features That Contribute To Risk:  Polarized thinking Thought constriction (tunnel vision)    Suicide Risk:  Minimal: No identifiable suicidal ideation.  Patients presenting with no risk factors but with morbid ruminations; may be classified as minimal risk based on the severity of the depressive symptoms  Discharge Diagnoses:   AXIS I:  Alcohol Dependence, S/P withdrawal, Depressive Disorder NOS AXIS II:  Deferred AXIS III:   Past Medical History  Diagnosis Date  . ETOH abuse   . Psoriasis   . Hypertension    AXIS IV:  other psychosocial or environmental problems AXIS V:  61-70 mild symptoms  Plan Of Care/Follow-up recommendations:  Activity:  as tolerated Diet:  regular Follow up outpatient basis Is patient on multiple antipsychotic therapies at discharge:  No   Has Patient had three or more failed trials of antipsychotic monotherapy by history:  No  Recommended Plan for Multiple Antipsychotic Therapies: NA  Deoni Cosey A 08/09/2013, 2:29 PM

## 2013-08-12 NOTE — Progress Notes (Signed)
Patient Discharge Instructions:  After Visit Summary (AVS):   Faxed to:  08/12/13 Psychiatric Admission Assessment Note:   Faxed to:  08/12/13 Suicide Risk Assessment - Discharge Assessment:   Faxed to:  08/12/13 Faxed/Sent to the Next Level Care provider:  08/12/13 Faxed to [facility] @ [phone]  Jerelene Redden, 08/12/2013, 2:54 PM

## 2013-09-22 ENCOUNTER — Other Ambulatory Visit: Payer: Self-pay

## 2014-01-13 ENCOUNTER — Telehealth: Payer: Self-pay

## 2014-01-19 NOTE — Telephone Encounter (Signed)
error 

## 2015-06-02 ENCOUNTER — Emergency Department (HOSPITAL_COMMUNITY): Payer: PRIVATE HEALTH INSURANCE

## 2015-06-02 ENCOUNTER — Emergency Department (HOSPITAL_COMMUNITY)
Admission: EM | Admit: 2015-06-02 | Discharge: 2015-06-02 | Disposition: A | Discharge status: Home / Self Care | Payer: Self-pay | Attending: Emergency Medicine | Admitting: Emergency Medicine

## 2015-06-02 ENCOUNTER — Emergency Department (HOSPITAL_COMMUNITY): Payer: Self-pay

## 2015-06-02 ENCOUNTER — Encounter (HOSPITAL_COMMUNITY): Payer: Self-pay | Admitting: Emergency Medicine

## 2015-06-02 DIAGNOSIS — F10120 Alcohol abuse with intoxication, uncomplicated: Secondary | ICD-10-CM | POA: Insufficient documentation

## 2015-06-02 DIAGNOSIS — I10 Essential (primary) hypertension: Secondary | ICD-10-CM | POA: Insufficient documentation

## 2015-06-02 DIAGNOSIS — Z872 Personal history of diseases of the skin and subcutaneous tissue: Secondary | ICD-10-CM | POA: Insufficient documentation

## 2015-06-02 DIAGNOSIS — F10920 Alcohol use, unspecified with intoxication, uncomplicated: Secondary | ICD-10-CM

## 2015-06-02 DIAGNOSIS — Z720 Tobacco use: Secondary | ICD-10-CM | POA: Insufficient documentation

## 2015-06-02 DIAGNOSIS — Z79899 Other long term (current) drug therapy: Secondary | ICD-10-CM | POA: Insufficient documentation

## 2015-06-02 LAB — CBC WITH DIFFERENTIAL/PLATELET
Basophils Absolute: 0.1 10*3/uL (ref 0.0–0.1)
Basophils Relative: 1 % (ref 0–1)
EOS ABS: 0.1 10*3/uL (ref 0.0–0.7)
EOS PCT: 1 % (ref 0–5)
HEMATOCRIT: 43.3 % (ref 39.0–52.0)
HEMOGLOBIN: 14.5 g/dL (ref 13.0–17.0)
LYMPHS ABS: 2.2 10*3/uL (ref 0.7–4.0)
Lymphocytes Relative: 23 % (ref 12–46)
MCH: 30.2 pg (ref 26.0–34.0)
MCHC: 33.5 g/dL (ref 30.0–36.0)
MCV: 90.2 fL (ref 78.0–100.0)
MONO ABS: 0.4 10*3/uL (ref 0.1–1.0)
MONOS PCT: 4 % (ref 3–12)
Neutro Abs: 7.1 10*3/uL (ref 1.7–7.7)
Neutrophils Relative %: 71 % (ref 43–77)
Platelets: 317 10*3/uL (ref 150–400)
RBC: 4.8 MIL/uL (ref 4.22–5.81)
RDW: 13.4 % (ref 11.5–15.5)
WBC: 10 10*3/uL (ref 4.0–10.5)

## 2015-06-02 LAB — RAPID URINE DRUG SCREEN, HOSP PERFORMED
AMPHETAMINES: NOT DETECTED
BARBITURATES: NOT DETECTED
Benzodiazepines: NOT DETECTED
COCAINE: NOT DETECTED
OPIATES: NOT DETECTED
Tetrahydrocannabinol: NOT DETECTED

## 2015-06-02 LAB — SALICYLATE LEVEL

## 2015-06-02 LAB — BASIC METABOLIC PANEL
Anion gap: 11 (ref 5–15)
BUN: 9 mg/dL (ref 6–20)
CHLORIDE: 107 mmol/L (ref 101–111)
CO2: 23 mmol/L (ref 22–32)
CREATININE: 0.75 mg/dL (ref 0.61–1.24)
Calcium: 8.4 mg/dL — ABNORMAL LOW (ref 8.9–10.3)
Glucose, Bld: 150 mg/dL — ABNORMAL HIGH (ref 65–99)
Potassium: 4.1 mmol/L (ref 3.5–5.1)
Sodium: 141 mmol/L (ref 135–145)

## 2015-06-02 LAB — ETHANOL: ALCOHOL ETHYL (B): 382 mg/dL — AB (ref <5)

## 2015-06-02 MED ORDER — LORAZEPAM 2 MG/ML IJ SOLN
1.0000 mg | Freq: Four times a day (QID) | INTRAMUSCULAR | Status: DC | PRN
  Administered 2015-06-02: 1 mg via INTRAVENOUS
  Filled 2015-06-02: qty 1

## 2015-06-02 MED ORDER — VITAMIN B-1 100 MG PO TABS: 100.0000 mg | ORAL_TABLET | Freq: Every day | ORAL | Status: DC

## 2015-06-02 MED ORDER — ACETAMINOPHEN 325 MG PO TABS: 650.0000 mg | ORAL_TABLET | Freq: Once | ORAL | Status: DC

## 2015-06-02 MED ORDER — ADULT MULTIVITAMIN W/MINERALS CH: 1.0000 | ORAL_TABLET | Freq: Every day | ORAL | Status: DC

## 2015-06-02 MED ORDER — FOLIC ACID 1 MG PO TABS: 1.0000 mg | ORAL_TABLET | Freq: Every day | ORAL | Status: DC

## 2015-06-02 MED ORDER — LORAZEPAM 1 MG PO TABS: 0.0000 mg | ORAL_TABLET | Freq: Two times a day (BID) | ORAL | Status: DC

## 2015-06-02 MED ORDER — LORAZEPAM 1 MG PO TABS: 0.0000 mg | ORAL_TABLET | Freq: Four times a day (QID) | ORAL | Status: DC

## 2015-06-02 MED ORDER — LORAZEPAM 1 MG PO TABS: 1.0000 mg | ORAL_TABLET | Freq: Four times a day (QID) | ORAL | Status: DC | PRN

## 2015-06-02 MED ORDER — THIAMINE HCL 100 MG/ML IJ SOLN: 100.0000 mg | Freq: Every day | INTRAMUSCULAR | Status: DC

## 2015-06-02 MED ORDER — SODIUM CHLORIDE 0.9 % IV BOLUS (SEPSIS)
1000.0000 mL | Freq: Once | INTRAVENOUS | Status: AC
  Administered 2015-06-02: 1000 mL via INTRAVENOUS

## 2015-06-02 NOTE — ED Notes (Signed)
Sam, CT Tech, aware pt ready for Head CT.

## 2015-06-02 NOTE — ED Notes (Signed)
Lab called with critical ETOH 382.  MD aware.

## 2015-06-02 NOTE — Discharge Instructions (Signed)
Alcohol Intoxication °Alcohol intoxication occurs when the amount of alcohol that a person has consumed impairs his or her ability to mentally and physically function. Alcohol directly impairs the normal chemical activity of the brain. Drinking large amounts of alcohol can lead to changes in mental function and behavior, and it can cause many physical effects that can be harmful.  °Alcohol intoxication can range in severity from mild to very severe. Various factors can affect the level of intoxication that occurs, such as the person's age, gender, weight, frequency of alcohol consumption, and the presence of other medical conditions (such as diabetes, seizures, or heart conditions). Dangerous levels of alcohol intoxication may occur when people drink large amounts of alcohol in a short period (binge drinking). Alcohol can also be especially dangerous when combined with certain prescription medicines or "recreational" drugs. °SIGNS AND SYMPTOMS °Some common signs and symptoms of mild alcohol intoxication include: °· Loss of coordination. °· Changes in mood and behavior. °· Impaired judgment. °· Slurred speech. °As alcohol intoxication progresses to more severe levels, other signs and symptoms will appear. These may include: °· Vomiting. °· Confusion and impaired memory. °· Slowed breathing. °· Seizures. °· Loss of consciousness. °DIAGNOSIS  °Your health care provider will take a medical history and perform a physical exam. You will be asked about the amount and type of alcohol you have consumed. Blood tests will be done to measure the concentration of alcohol in your blood. In many places, your blood alcohol level must be lower than 80 mg/dL (0.08%) to legally drive. However, many dangerous effects of alcohol can occur at much lower levels.  °TREATMENT  °People with alcohol intoxication often do not require treatment. Most of the effects of alcohol intoxication are temporary, and they go away as the alcohol naturally  leaves the body. Your health care provider will monitor your condition until you are stable enough to go home. Fluids are sometimes given through an IV access tube to help prevent dehydration.  °HOME CARE INSTRUCTIONS °· Do not drive after drinking alcohol. °· Stay hydrated. Drink enough water and fluids to keep your urine clear or pale yellow. Avoid caffeine.   °· Only take over-the-counter or prescription medicines as directed by your health care provider.   °SEEK MEDICAL CARE IF:  °· You have persistent vomiting.   °· You do not feel better after a few days. °· You have frequent alcohol intoxication. Your health care provider can help determine if you should see a substance use treatment counselor. °SEEK IMMEDIATE MEDICAL CARE IF:  °· You become shaky or tremble when you try to stop drinking.   °· You shake uncontrollably (seizure).   °· You throw up (vomit) blood. This may be bright red or may look like black coffee grounds.   °· You have blood in your stool. This may be bright red or may appear as a black, tarry, bad smelling stool.   °· You become lightheaded or faint.   °MAKE SURE YOU:  °· Understand these instructions. °· Will watch your condition. °· Will get help right away if you are not doing well or get worse. °Document Released: 08/13/2005 Document Revised: 07/06/2013 Document Reviewed: 04/08/2013 °ExitCare® Patient Information ©2015 [employer]. This information is not intended to replace advice given to you by your health care provider. Make sure you discuss any questions you have with your health care provider. °Substance Abuse Treatment Programs ° °Intensive Outpatient Programs °[facility]     °[address]      °[place], [place]                   °336-878-6098      ° °[facility] °[address] °[place], [place] °336-379-7146 ° °[facility]     °(Inpatient and outpatient)     °[address]           °[phone]   ° °[facility] °336-288-1484 (Suboxone and Methadone) ° °[address]      °[place], [place] [address]      °336-882-2125      ° °[address] °[place], [place] °852-3033 ° °[facility] (Outpatient/Inpatient, Chemical)    °(insurance only) [phone]      °       °[facility]Groups & Residential) °[place], [place] °336-389-1413 ° °   °[facility]     °[address]     °[place], [place]      °336-389-1413      ° °[facility] (for caregivers and family) °[address] °[place], [place] °336-299-4655 ° ° ° ° ° °Residential Treatment Programs °[facility]      °[address], [place], [place] [address]  °([phone]      ° °[facility] °[address] [place], [place] [address] °919-419-1059 ° °[facility]        °336-248-8914      ° °[facility] °1-800-659-3381 ° °[facility]             °[address]                                         °[place], [place]                                                °877-615-2722 or 336-784-9470                              ° °[facility] [place] °[address] °[place], [address] °1.877.941.8954 ° °[facility]    °[address]      °[place]     °[phone]      ° °[facility] °[address] °[place], [place] °336-285-9073 ° °[facility]   °[address]     °[place], [place] [address]     °336-899-1550      °Admissions: 8am-3pm M-F ° °[facility] °[address] °, [place] °336-227-7417 ° °[facility]: Residential Program (90 Days)   °[place], [place]      °336-725-8389 or 800-758-6077    ° °[facility] °[place], [place] °(Walk in Hours over the weekend or by referral) ° °[facility] °[address], [place], [place] [address] °(336) [phone] ° °[facility]: [facility]:  1-877-626-1772 (for crisis response 24 hours a day) °[facility] Hotline:      1-800-256-2452 °Outpatient Psychiatry and Counseling ° °[facility]: Mobile Crisis Management 24 hours:  1-877-626-1772 ° °[facility] sliding scale fee and walk in schedule: M-F 8am-12pm/1pm-3pm °[address]  °[place],   [place] [address] °[phone] ° °[facility] °[address] °[place], [place] [address] °336-703-9650 ° °[facility] ([facility] known [facility])- new patient walk-in appointments available Monday - Friday 8am -3pm.          °[address] °[place], [place] [address] °336-676-6840 or crisis line- 336-676-6905 ° °[facility]/ Intensive Outpatient Therapy Program °[address] °[place], [place] [address] °336-832-9804 ° °[facility]                  °Crisis Services      °336.641.4993      °[address]     °[place], [place] [address]                ° °[facility]   °[facility] °[phone] °[address] °[place], [place] [address] ° ° °[facility]          °[address]  °[place], [place] [address]       °(336) [phone] ° °[facility] °[address] °[place], [place] [address] °336-292-1510 ° °[facility]    °[address]    °[place], [place] [address]     °336-632-3505      ° °Parish McKinney, MD     °[address]     °[place] [address]     °336-282-1251     °  °[facility] °[address] °[place] [address] ° °[facility]     °[address]     °[place], [place]      °336-542-2076      ° °[facility] °Shamsher Ahluwalia, MD °[address] °[place], [place] [address] °336-420-9558 ° °[facility]     °[address]     °[place], [place]  [address]     °336-274-1237      ° °[facility] °[address] °[place], [place] [address] °336-854-4450 °Resources for Temporary Residential Assistance/Crisis Centers ° °DAY CENTERS °[facility] °M-F 8am-3pm   °[address] [place], [place] [address]   336-332-0824 °Services include: laundry, barbering, support groups, case management, phone  & computer access, showers, AA/NA mtgs, mental health/substance abuse nurse, job skills class, disability information, VA assistance, spiritual classes, etc.  ° °HOMELESS SHELTERS ° °[facility]     °[facility]   °[address], [place]     °336.271.5959       °       °[facility] (women and children)       °[address] °[place], [place] [address] °336-275-0820 °[email or web address] for application and process °Application Required ° °[facility]   °[address]    °[place] [address]     °[phone]       °             °[facility] °[address] °[place], [place] [address] °336.273.5572 °336-235-0363(schedule application appt.) °Application Required ° °[facility] (women only)    °[address]     °[place], [place] [address]     °336-884-1039      °Intake starts 6pm daily °Need valid ID, SSC, & Police report °[facility] [place] °[address] °[place],  °336-881-5420 °Application Required ° °  [facility] (men only)     °[address]      °[place], [place]     °336.748.1962      ° °[facility] °(Pregnant women only) °[address] °[place], [place] °336-275-0206 ° °[facility]      °[address]      °[place], [place] [address]     °[phone]      °       °[facility] °[address] °[place], [place] °336-723-1848 °90 day commitment/SA/Application process ° °[facility](men only)     °[address]     °[place]     °336-748-1962       °Check-in at 7pm     °       °[facility] °[address] °[place], [place] [address] °336-248-6684 °Men/Women/Women and Children must be there by 7 pm ° °[facility] °[place],  °336-722-8721                ° °

## 2015-06-02 NOTE — ED Notes (Signed)
Winn Jock James, EMT, called staffing to request sitter.

## 2015-06-02 NOTE — ED Notes (Signed)
Confirmed IVC rescind with [employer]

## 2015-06-02 NOTE — ED Notes (Signed)
24-HOUR IVC PAPERWORK COMPLETED BY DR J KNAPP. ADVISED PLAN IS FOR PT TO SOBER OVER NEXT FEW HOURS THEN MAY BE D/C'D.

## 2015-06-02 NOTE — ED Notes (Signed)
Lynelle DoctorKnapp MD to rescind IVC papers at this time.

## 2015-06-02 NOTE — ED Notes (Signed)
Pt ambulatory, alert x4, clinically sober. Remembers comments made by RN, per report pt was not able to do this five hours ago. Dr. Lynelle DoctorKnapp notified.

## 2015-06-02 NOTE — ED Notes (Signed)
Jolaine ClickM Browning, RN, aware of pt being under 24-hour IVC.

## 2015-06-02 NOTE — ED Notes (Signed)
CT called pt is being uncooperative will be sending pt back.  MD aware.
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
## 2015-06-02 NOTE — ED Notes (Signed)
Pt back from X-ray.  

## 2015-06-02 NOTE — ED Notes (Signed)
Pt refusing IV and tylenol. Pt restless. [employer] at room.

## 2015-06-02 NOTE — ED Notes (Addendum)
Pt becoming very agitated and cussing at sitter.  Stating "I just want to go the fuck home."

## 2015-06-11 IMAGING — CR DG CHEST 1V PORT
1 series · 1 of 1 positions shown · non-contrast
Comparison: 04/30/2013

CLINICAL DATA: Altered mental status.  Unresponsive.

PORTABLE CHEST - 1 VIEW

[AP]
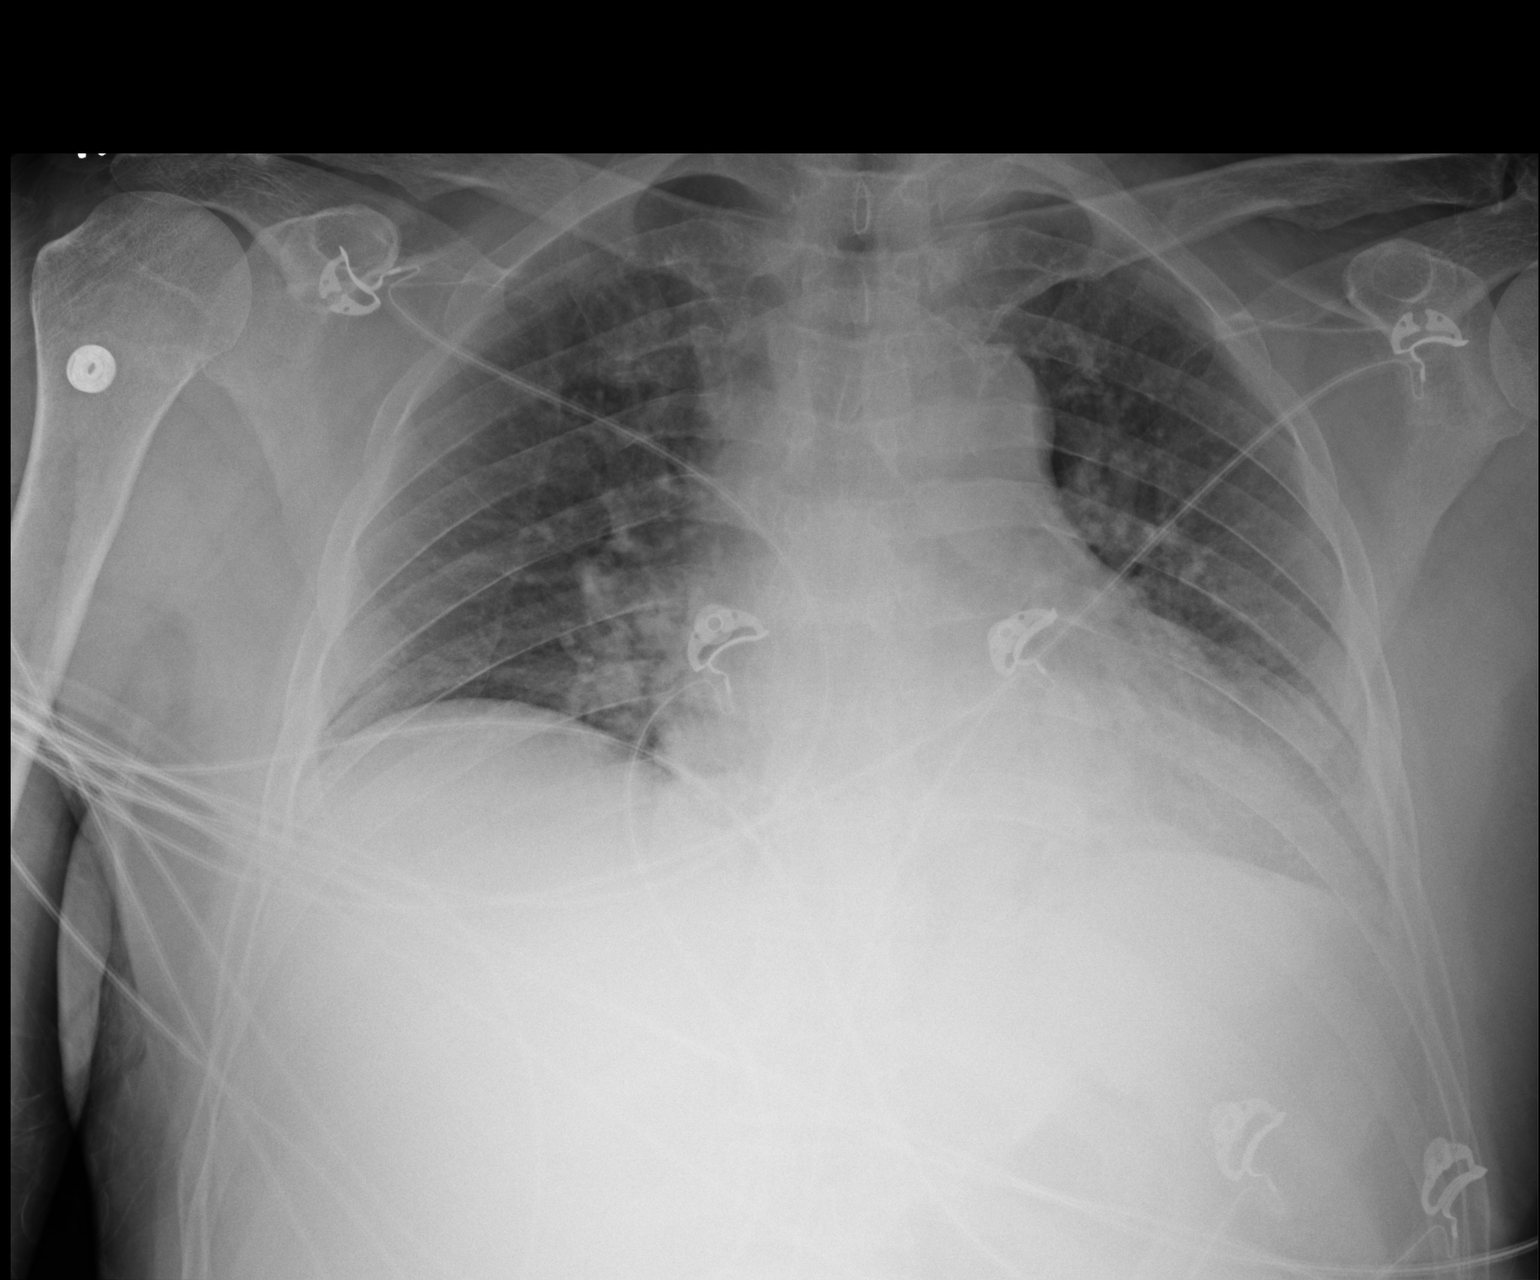

[1 of 1 positions shown; findings below may reference images not displayed]

FINDINGS: Low lung volumes.  Vascular crowding.  Mild cardiomegaly
and bibasilar atelectasis.  No effusions.  No acute bony
abnormality.
IMPRESSION: Cardiomegaly, vascular congestion.  Low lung volumes with bibasilar
atelectasis.

## 2023-02-06 ENCOUNTER — Ambulatory Visit (INDEPENDENT_AMBULATORY_CARE_PROVIDER_SITE_OTHER): Payer: Medicaid Other | Admitting: Family Medicine

## 2023-02-06 ENCOUNTER — Encounter: Payer: Self-pay | Admitting: Family Medicine

## 2023-02-06 VITALS — BP 120/84 | HR 107 | Temp 98.3°F | Ht 71.0 in | Wt 222.0 lb

## 2023-02-06 DIAGNOSIS — E1165 Type 2 diabetes mellitus with hyperglycemia: Secondary | ICD-10-CM | POA: Diagnosis not present

## 2023-02-06 DIAGNOSIS — R030 Elevated blood-pressure reading, without diagnosis of hypertension: Secondary | ICD-10-CM

## 2023-02-06 DIAGNOSIS — R739 Hyperglycemia, unspecified: Secondary | ICD-10-CM

## 2023-02-06 DIAGNOSIS — L409 Psoriasis, unspecified: Secondary | ICD-10-CM

## 2023-02-06 LAB — POCT GLYCOSYLATED HEMOGLOBIN (HGB A1C): Hemoglobin A1C: 13.3 % — AB (ref 4.0–5.6)

## 2023-02-06 MED ORDER — CLOBETASOL PROPIONATE 0.05 % EX OINT: 1.0000 | TOPICAL_OINTMENT | Freq: Two times a day (BID) | CUTANEOUS | 5 refills | Status: DC

## 2023-02-06 MED ORDER — LISINOPRIL 10 MG PO TABS: 10.0000 mg | ORAL_TABLET | Freq: Every day | ORAL | 1 refills | Status: DC

## 2023-02-06 MED ORDER — LANCETS MISC. MISC: 1.0000 | Freq: Every day | 3 refills | Status: AC

## 2023-02-06 MED ORDER — BLOOD GLUCOSE TEST VI STRP: 1.0000 | ORAL_STRIP | Freq: Every day | 3 refills | Status: DC

## 2023-02-06 MED ORDER — METFORMIN HCL 500 MG PO TABS: 1000.0000 mg | ORAL_TABLET | Freq: Two times a day (BID) | ORAL | 2 refills | Status: DC

## 2023-02-06 MED ORDER — BLOOD GLUCOSE MONITORING SUPPL DEVI: 1.0000 | Freq: Three times a day (TID) | 0 refills | Status: DC

## 2023-02-06 NOTE — Progress Notes (Signed)
New Patient Office Visit  Subjective    Patient ID: Jeffrey Ormiston., male    DOB: 1961-01-05  Age: 62 y.o. MRN: [id number]  CC:  Chief Complaint  Patient presents with   Establish Care    HPI Jeffrey Black. presents to establish care Patient is here to establish care. He reports he did not have a previous primary care provider, states that he is a recovering alcoholic. States that he is currently sober.  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 States that since then he has noticed changing vision, balance, strength, etc. States that he is very tired, low energy.  Pt does report that he is cutting down on smoking, down to 4 cigarettes per day. States that when he mows the lawn or gets any exercise he does get SOB. States that he has a deviated septum which clogs up while he is sleeping. No coughing, no chest tightness, states that it is more his sinuses.  Outpatient Encounter Medications as of 02/06/2023  Medication Sig   Blood Glucose Monitoring Suppl DEVI 1 each by Does not apply route in the morning, at noon, and at bedtime. May substitute to any manufacturer covered by patient's insurance.   clobetasol ointment (TEMOVATE) [id number] % Apply 1 Application topically 2 (two) times daily.   Glucose Blood (BLOOD GLUCOSE TEST STRIPS) STRP 1 each by In Vitro route daily. May substitute to any manufacturer covered by patient's insurance.   Lancets Misc. MISC 1 each by Does not apply route daily. May substitute to any manufacturer covered by patient's insurance.   lisinopril (ZESTRIL) 10 MG tablet Take 1 tablet (10 mg total) by mouth daily.   metFORMIN (GLUCOPHAGE) 500 MG tablet Take 2 tablets (1,000 mg total) by mouth 2 (two) times daily with a meal.   [DISCONTINUED] escitalopram (LEXAPRO) 20 MG tablet Take 1 tablet (20 mg total) by mouth daily. For depression   [DISCONTINUED] gabapentin (NEURONTIN) 400 MG capsule Take 1  capsule (400 mg total) by mouth 3 (three) times daily.   [DISCONTINUED] metoprolol (LOPRESSOR) 50 MG tablet Take 1 tablet (50 mg total) by mouth 2 (two) times daily. For hypertension   [DISCONTINUED] Multiple Vitamin (MULTIVITAMIN WITH MINERALS) TABS tablet Take 1 tablet by mouth daily. For low vitamin   [DISCONTINUED] naproxen sodium (ANAPROX) 220 MG tablet Take 1 tablet (220 mg total) by mouth 2 (two) times daily with a meal. For pain   [DISCONTINUED] oxyCODONE (OXY IR/ROXICODONE) 5 MG immediate release tablet Take 1-2 tablets (5-10 mg total) by mouth every 4 (four) hours as needed. For severe pain   [DISCONTINUED] QUEtiapine (SEROQUEL) 200 MG tablet Take 1 tablet (200 mg total) by mouth at bedtime. For mood control   [DISCONTINUED] QUEtiapine (SEROQUEL) 50 MG tablet Take 1 tablet (50 mg total) by mouth 2 (two) times daily. For anxiety/mood control   No facility-administered encounter medications on file as of 02/06/2023.    Past Medical History:  Diagnosis Date   Depression    ETOH abuse    History of bronchitis    Hypertension    Psoriasis     Past Surgical History:  Procedure Laterality Date   ESOPHAGOGASTRODUODENOSCOPY N/A 05/03/2013   Procedure: ESOPHAGOGASTRODUODENOSCOPY (EGD);  Surgeon: Ladene Artist, MD;  Location: [facility];  Service: Endoscopy;  Laterality: N/A;   left 4th finger surgery      Family History  Problem Relation Age of Onset  Diabetes Mother    Diabetes type II Mother    Kidney disease Father    Hypertension Father    Hyperlipidemia Father    Heart disease Father    Stroke Father    CVA Father    CVA Maternal Grandmother    Hypertension Maternal Grandmother    Hyperlipidemia Maternal Grandmother    Heart disease Maternal Grandmother    Diabetes Maternal Grandmother    Hypertension Maternal Grandfather    Hyperlipidemia Maternal Grandfather    Heart disease Maternal Grandfather    Diabetes Maternal Grandfather    Hyperlipidemia Paternal  Grandmother    Heart disease Paternal Grandmother    Hyperlipidemia Paternal Grandfather    Heart disease Paternal Grandfather    Diabetes Paternal Grandfather    Depression Paternal Grandfather    Cancer - Colon Other     Social History   Socioeconomic History   Marital status: Divorced    Spouse name: Not on file   Number of children: Not on file   Years of education: Not on file   Highest education level: Not on file  Occupational History   Not on file  Tobacco Use   Smoking status: Every Day    Packs/day: 1.00    Years: 20.00    Additional pack years: 0.00    Total pack years: 20.00    Types: Cigarettes   Smokeless tobacco: Never  Vaping Use   Vaping Use: Never used  Substance and Sexual Activity   Alcohol use: Not Currently    Comment: drinks 1/5  per day   Drug use: No   Sexual activity: Not Currently  Other Topics Concern   Not on file  Social History Narrative   Not on file   Social Determinants of Health   Financial Resource Strain: Not on file  Food Insecurity: Not on file  Transportation Needs: Not on file  Physical Activity: Not on file  Stress: Not on file  Social Connections: Not on file  Intimate Partner Violence: Not on file    Review of Systems  All other systems reviewed and are negative.       Objective    BP 120/84 (BP Location: Left Arm, Patient Position: Sitting, Cuff Size: Large)   Pulse (!) 107   Temp 98.3 F (36.8 C) (Oral)   Ht 5\' 11"  (1.803 m)   Wt 222 lb (100.7 kg)   SpO2 96%   BMI 30.96 kg/m   Physical Exam  {Labs (Optional):23779}    Assessment & Plan:   Problem List Items Addressed This Visit       Unprioritized   Psoriasis   Relevant Medications   clobetasol ointment (TEMOVATE) 0.05 %   Other Visit Diagnoses     Hyperglycemia    -  Primary   Relevant Orders   POC HgB A1c (Completed)   Type 2 diabetes mellitus with hyperglycemia, without long-term current use of insulin (HCC)       Relevant  Medications   metFORMIN (GLUCOPHAGE) 500 MG tablet   Blood Glucose Monitoring Suppl DEVI   Glucose Blood (BLOOD GLUCOSE TEST STRIPS) STRP   Lancets Misc. MISC   lisinopril (ZESTRIL) 10 MG tablet   Other Relevant Orders   Lipid Panel   CMP   Microalbumin/Creatinine Ratio, Urine   Elevated blood pressure reading       Relevant Medications   lisinopril (ZESTRIL) 10 MG tablet   Other Relevant Orders   TSH  Return in about 3 months (around 05/09/2023) for DM.   Farrel Conners, MD

## 2023-02-06 NOTE — Patient Instructions (Addendum)
Start with metformin 500 mg daily for 7 days, then increase to 1 tablet twice a day for 7 days, then increase to 2 tablets in the morning and 1 tablet at night for 7 days, then increase to 2 tablets twice a day.  Fasting blood sugar -- 110  Nothing over 200 for the whole day

## 2023-02-09 ENCOUNTER — Other Ambulatory Visit (INDEPENDENT_AMBULATORY_CARE_PROVIDER_SITE_OTHER): Payer: Medicaid Other

## 2023-02-09 DIAGNOSIS — E1165 Type 2 diabetes mellitus with hyperglycemia: Secondary | ICD-10-CM | POA: Diagnosis not present

## 2023-02-09 DIAGNOSIS — R030 Elevated blood-pressure reading, without diagnosis of hypertension: Secondary | ICD-10-CM

## 2023-02-09 LAB — COMPREHENSIVE METABOLIC PANEL
ALT: 16 U/L (ref 0–53)
AST: 11 U/L (ref 0–37)
Albumin: 4.4 g/dL (ref 3.5–5.2)
Alkaline Phosphatase: 98 U/L (ref 39–117)
BUN: 15 mg/dL (ref 6–23)
CO2: 26 mEq/L (ref 19–32)
Calcium: 9.1 mg/dL (ref 8.4–10.5)
Chloride: 93 mEq/L — ABNORMAL LOW (ref 96–112)
Creatinine, Ser: 0.9 mg/dL (ref 0.40–1.50)
GFR: 91.83 mL/min (ref >60.00)
Glucose, Bld: 371 mg/dL — ABNORMAL HIGH (ref 70–99)
Potassium: 4.6 mEq/L (ref 3.5–5.1)
Sodium: 126 mEq/L — ABNORMAL LOW (ref 135–145)
Total Bilirubin: 0.3 mg/dL (ref 0.2–1.2)
Total Protein: 7.4 g/dL (ref 6.0–8.3)

## 2023-02-09 LAB — MICROALBUMIN / CREATININE URINE RATIO
Creatinine,U: 51.2 mg/dL
Microalb Creat Ratio: 1.4 mg/g (ref 0.0–30.0)
Microalb, Ur: <0.7 mg/dL (ref 0.0–1.9)

## 2023-02-09 LAB — LIPID PANEL
Cholesterol: 185 mg/dL (ref 0–200)
HDL: 29.2 mg/dL — ABNORMAL LOW (ref >39.00)
Total CHOL/HDL Ratio: 6
Triglycerides: 423 mg/dL — ABNORMAL HIGH (ref 0.0–149.0)

## 2023-02-09 LAB — LDL CHOLESTEROL, DIRECT: Direct LDL: 131 mg/dL

## 2023-02-10 LAB — TSH: TSH: 0.78 u[IU]/mL (ref 0.35–5.50)

## 2023-02-11 DIAGNOSIS — R030 Elevated blood-pressure reading, without diagnosis of hypertension: Secondary | ICD-10-CM | POA: Insufficient documentation

## 2023-02-11 DIAGNOSIS — E1165 Type 2 diabetes mellitus with hyperglycemia: Secondary | ICD-10-CM | POA: Insufficient documentation

## 2023-02-11 NOTE — Assessment & Plan Note (Signed)
Pt has had a previous history of HTN listed in his past medical history. I will start lisinopril once daily at 10 mg for kidney protection and to treat. I advised that he get a BP machine to start checking his BP at home daily.

## 2023-02-11 NOTE — Assessment & Plan Note (Signed)
Severely uncontrolled. We had a long discussion about diet and exercise, handouts were given on low carb food choices. We will start paitent on metformin 500 mg, gradually increasing the frequency up to 2 tablet twice a day over the next three months. I am checking all labs including CMP, urine for protein, lipid panel, and TSH. We discussed adding a statin medication however we will do this likely at the next visit.

## 2023-02-11 NOTE — Assessment & Plan Note (Signed)
Rash is consistent with his history of psoriasis, it is fairly widespread at this time due to this uncontrolled DM. Will treat the itching first with clobetasol ointment to be used off and on for 2 weeks at a time. Pt warned of side effects if he uses the cream for too long. It is likely that he will need a dermatology referral if the patches do not improve

## 2023-03-20 ENCOUNTER — Telehealth: Payer: Self-pay

## 2023-03-20 NOTE — Progress Notes (Signed)
   Care Guide Note  03/20/2023 Name: Youssouf Sattler. MRN: [id number] DOB: 15-Apr-1961  Referred by: Karie Georges, MD Reason for referral : Care Coordination (Outreach to schedule with Pharm d New MM DM )   Lizbeth Wodrich. is a 62 y.o. year old male who is a primary care patient of Karie Georges, MD. Maceo Pro. was referred to the pharmacist for assistance related to DM.    Successful contact was made with the patient to discuss pharmacy services including being ready for the pharmacist to call at least 5 minutes before the scheduled appointment time, to have medication bottles and any blood sugar or blood pressure readings ready for review. The patient agreed to meet with the pharmacist via with the pharmacist via telephone visit on (date/time).  03/24/2023  Penne Lash, RMA Care Guide [facility]  [place], [place] [address] Direct Dial: [phone] [email or web address]

## 2023-03-20 NOTE — Progress Notes (Signed)
   Care Guide Note  03/20/2023 Name: Leaf Sells. MRN: [id number] DOB: Sep 06, 1961  Referred by: Karie Georges, MD Reason for referral : Care Coordination (Outreach to schedule with Pharm d New MM DM )   Jashad Ganus. is a 62 y.o. year old male who is a primary care patient of Karie Georges, MD. Maceo Pro. was referred to the pharmacist for assistance related to DM.    An unsuccessful telephone outreach was attempted today to contact the patient who was referred to the pharmacy team for assistance with medication management. Additional attempts will be made to contact the patient.   Penne Lash, RMA Care Guide [facility]  [place], [place] [address] Direct Dial: [phone] [email or web address]

## 2023-03-24 ENCOUNTER — Other Ambulatory Visit: Payer: Medicaid Other

## 2023-03-24 NOTE — Progress Notes (Signed)
03/24/2023 Name: Jeffrey Black. MRN: [id number] DOB: Jan 26, 1961  Chief Complaint  Patient presents with   Diabetes   Jeffrey Black. is a 62 y.o. year old male who presented for a telephone visit.   They were referred to the pharmacist by a quality report for assistance in managing diabetes.   Patient is participating in a Managed Medicaid Plan:  Yes  Subjective:  Care Team: Primary Care Provider: Karie Georges, MD ; Next Scheduled Visit: 05/18/23  Medication Access/Adherence  Current Pharmacy:  [facility] [place], [place] - [address] [place] [address] Phone: [phone] Fax: [phone]  -Patient reports affordability concerns with their medications: No  -Patient reports access/transportation concerns to their pharmacy: No  -Patient reports adherence concerns with their medications:  No    Diabetes: Current medications:  metformin 1000mg  BID -Medications tried in the past: N/A -Current glucose readings: FBG 150-170, post-prandial averaging 250 -Patient is checking BG daily, alternating between fasting and post-prandial -Recently established care with Dr. Casimiro Needle and began metformin titration - started current dose approximately 3 weeks ago -Reports arms feeling like they are going to sleep at night time when he lays down; states this began about a week ago -Discussed additional therapies to decrease A1c such as SGLT2 and GLP1  Hypertension: Current medications: lisinopril 10mg  daily  -Medications previously tried: metoprolol  -Patient does check his BP at home and states it ranges 110-130/70-85 -States HR typically 100bpm, and metoprolol was previously used to regulate  Hyperlipidemia/ASCVD Risk Reduction Current lipid lowering medications: Non -Medications tried in the past: N/A Antiplatelet regimen: None -Dr. Kelton Pillar note from last OV indicates she will discuss lipid therapy at next  visit  Objective: Lab Results  Component Value Date   HGBA1C 13.3 (A) 02/06/2023   Lab Results  Component Value Date   CREATININE 0.90 02/09/2023   BUN 15 02/09/2023   NA 126 (L) 02/09/2023   K 4.6 02/09/2023   CL 93 (L) 02/09/2023   CO2 26 02/09/2023   Lab Results  Component Value Date   CHOL 185 02/09/2023   HDL 29.20 (L) 02/09/2023   LDLDIRECT 131.0 02/09/2023   TRIG (H) 02/09/2023    423.0 Triglyceride is over 400; calculations on Lipids are invalid.   CHOLHDL 6 02/09/2023   Medications Reviewed Today     Reviewed by Rachael Fee, MD (Physician) on 08/03/13 at 1549  Med List Status: Complete   Medication Order Taking? Sig Documenting Provider Last Dose Status Informant  escitalopram (LEXAPRO) 20 MG tablet [id number]  Take 1 tablet (20 mg total) by mouth daily. Verne Spurr, PA-C  Active Self           Med Note Laural Benes, JENNIFER H   Tue Aug 02, 2013  8:08 AM) -  gabapentin (NEURONTIN) 400 MG capsule [id number]  Take 1 capsule (400 mg total) by mouth 3 (three) times daily. Verne Spurr, PA-C  Active Self           Med Note Laural Benes, JENNIFER H   Tue Aug 02, 2013  8:10 AM) -  ibuprofen (ADVIL,MOTRIN) 200 MG tablet [id number]  Take 200 mg by mouth every 6 (six) hours as needed for pain. [provider]  Active Self  metoprolol (LOPRESSOR) 50 MG tablet [id number]  Take 1 tablet (50 mg total) by mouth 2 (two) times daily. Philip Aspen, Limmie Patricia, MD  Active Self  Multiple Vitamin (MULTIVITAMIN WITH  MINERALS) TABS tablet [id number]  Take 1 tablet by mouth daily. Verne Spurr, PA-C  Active Self           Med Note Laural Benes, JENNIFER H   Tue Aug 02, 2013  8:10 AM) -  naproxen sodium (ANAPROX) 220 MG tablet [id number]  Take 220 mg by mouth 2 (two) times daily with a meal. [provider]  Active Self  oxyCODONE (OXY IR/ROXICODONE) 5 MG immediate release tablet [id number]  Take 1-2 tablets (5-10 mg total) by mouth every 4 (four) hours as needed. Philip Aspen,  Limmie Patricia, MD  Active Self  QUEtiapine (SEROQUEL) 200 MG tablet [id number]  Take 1 tablet (200 mg total) by mouth at bedtime. Verne Spurr, PA-C  Active Self           Med Note Laural Benes, JENNIFER H   Tue Aug 02, 2013  8:08 AM) -  QUEtiapine (SEROQUEL) 50 MG tablet [id number]  Take 1 tablet (50 mg total) by mouth 2 (two) times daily. Verne Spurr, PA-C  Active Self           Med Note Laural Benes, JENNIFER H   Tue Aug 02, 2013  8:08 AM) -           Assessment/Plan:   Diabetes: - Currently uncontrolled - Reviewed long term cardiovascular and renal outcomes of uncontrolled blood sugar - Reviewed goal A1c, goal fasting, and goal 2 hour post prandial glucose - Recommend to initiate Jardiance 10mg  daily; this is preferred on Medicaid but still may require a prior authorization.  Patient does not endorse history of genitourinary complications and agrees with recommendation - Pending prescription for Dr. Casimiro Needle to sign if in agreement  - Recommend to check glucose daily and record values - Feeling of arms being asleep could be related to low B12 from metformin initiation and recent titration; suggested that patient start taking B12 daily and contact office if this does not resolve in a week or so - Recommend follow up A1c, CMP, and B12 lab at 7/1 visit   Hypertension: - Currently controlled - Reviewed long term cardiovascular and renal outcomes of uncontrolled blood pressure and discussed additional renal benefit of lisinopril with his T2DM diagnosis - Recommended to check home blood pressure and heart rate daily - Could add low dose beta blocker back on if HR remains elevated; may need to decrease lisinopril dose if so (available in 2.5 and 5mg  tablets)   Hyperlipidemia/ASCVD Risk Reduction: - Currently uncontrolled.  - Recommend to initiate high intensity statin, such as atorvastatin 40mg  or rosuvastatin 20mg  daily at next OV - Follow-up lipids and LFT's in 4-8 weeks after therapy initiation    Follow Up Plan:  Telephone visit in 4 weeks to check on home BG; will also contact patient if/when Jardiance sent in/approved by insurance  Lenna Gilford, PharmD, DPLA

## 2023-03-30 MED ORDER — EMPAGLIFLOZIN 10 MG PO TABS: 10.0000 mg | ORAL_TABLET | Freq: Every day | ORAL | 2 refills | Status: DC

## 2023-03-30 NOTE — Progress Notes (Signed)
Script sent  

## 2023-03-31 ENCOUNTER — Telehealth: Payer: Self-pay

## 2023-03-31 NOTE — Progress Notes (Signed)
   03/31/2023  Patient ID: Jeffrey Pro., male   DOB: 1961-02-10, 62 y.o.   MRN: [id number]  Subjective/Objective:  Diabetes Management Plan -Dr. Casimiro Needle signed Jardiance order, and PA was approved on insurance -Contacted pharmacy, and prescription went through for $4 copay -Contacted patient to inform that prescription is being worked on at pharmacy -Patient still endorsing hand/arm tingling, but has not started OTC B12 supplementation at this time -Also states he is seeing a difference in his long-distance vision, but does not endorse blurry vision  Assessment/Plan  Diabetes Management Plan -Patient plans to pick up Jardiance and 1,033mcg vitamin B12 and start therapy today -Telephone visit scheduled for 6/4 to check on tolerance of Jardiance, home BG, and status of tingling in extremities and vision -Recommend diabetic eye exam if patient does not already have one scheduled -Educated patient to call office if there are any needs before scheduled telephone visit  Jeffrey Black, PharmD, DPLA

## 2023-04-21 ENCOUNTER — Other Ambulatory Visit: Payer: Medicaid Other

## 2023-04-21 NOTE — Progress Notes (Unsigned)
   04/21/2023  Patient ID: Jeffrey Pro., male   DOB: [birth year], 62 y.o.   MRN: [id number]  Subjective/Objective: Telephone visit to follow up on tolerance of medications and home BG and BP  DM -Patient continues to take metformin 500mg  BID and initated Jardiance 10mg  daily -Endorses tolerating Jardiance well with no adverse effects -Started taking daily B12 supplement but is still experiencing tingling in hands/arms -FBG 115, post-prandial 170  HTN -Now patient curious if tingling may be related to lisinopril 10mg  daily, as this was initiated around the time metformin dose was increased. Patient wishes to hold 2 weeks prior to upcoming PCP appointment to see if that resolves tingling -Home BP has been 112/80, 107/79, 104/82  HLD -Discussed patient's elevated LDL and TG and need to initiate statin therapy at upcoming PCP appointment.  Patient is in agreement.  Assessment/Plan:  DM -Continue current regimen and regular monitoring/recording of home BG -Follow-up A1c at next PCP appointment -If A1c remains elevated, I recommend increasing Jardiance to 25mg  daily -Recommend B12 level at next appointment, also  HTN -Patient will hold lisinopril 10mg  2 weeks prior to next appointment to see if tingling sensation in hands/arms resolves.  If this does, I recommend alternate therapy with low-dose ARB for BP control and renal protection in setting of DM -If tingling does not resolve, resume lisinopril 10mg  daily and consider alternative culprit -Educated patient to closely monitor BP while not taking lisinopril and contact office if consistently >140/90, or if he experiences any hypertension symptoms (chest pain, SOB, headache, vision changes). -Recommend CMP and CBC at next visit  HLD -Based on patient age, LDL, and DM diagnosis- I recommend high dose statin therapy with Atorvastatin 40mg  daily  Follow-up:  None scheduled but can as needed per PCP after patients 7/1 appointment with  Dr. Daryel Gerald, PharmD, DPLA

## 2023-05-18 ENCOUNTER — Ambulatory Visit (INDEPENDENT_AMBULATORY_CARE_PROVIDER_SITE_OTHER): Payer: Medicaid Other | Admitting: Family Medicine

## 2023-05-18 ENCOUNTER — Encounter: Payer: Self-pay | Admitting: Family Medicine

## 2023-05-18 VITALS — BP 128/90 | HR 105 | Temp 98.4°F | Ht 71.0 in | Wt 210.3 lb

## 2023-05-18 DIAGNOSIS — E1165 Type 2 diabetes mellitus with hyperglycemia: Secondary | ICD-10-CM | POA: Diagnosis not present

## 2023-05-18 DIAGNOSIS — F17200 Nicotine dependence, unspecified, uncomplicated: Secondary | ICD-10-CM

## 2023-05-18 DIAGNOSIS — Z1211 Encounter for screening for malignant neoplasm of colon: Secondary | ICD-10-CM

## 2023-05-18 DIAGNOSIS — L4050 Arthropathic psoriasis, unspecified: Secondary | ICD-10-CM

## 2023-05-18 DIAGNOSIS — I10 Essential (primary) hypertension: Secondary | ICD-10-CM

## 2023-05-18 DIAGNOSIS — Z7984 Long term (current) use of oral hypoglycemic drugs: Secondary | ICD-10-CM

## 2023-05-18 LAB — POCT GLYCOSYLATED HEMOGLOBIN (HGB A1C): Hemoglobin A1C: 8 % — AB (ref 4.0–5.6)

## 2023-05-18 MED ORDER — OLMESARTAN MEDOXOMIL 5 MG PO TABS
5.0000 mg | ORAL_TABLET | Freq: Every day | ORAL | 1 refills | Status: DC
Start: 2023-05-18 — End: 2023-09-24

## 2023-05-18 MED ORDER — EMPAGLIFLOZIN 25 MG PO TABS
25.0000 mg | ORAL_TABLET | Freq: Every day | ORAL | 1 refills | Status: DC
Start: 2023-05-18 — End: 2023-09-24

## 2023-05-18 MED ORDER — ROSUVASTATIN CALCIUM 5 MG PO TABS
5.0000 mg | ORAL_TABLET | Freq: Every day | ORAL | 3 refills | Status: DC
Start: 2023-05-18 — End: 2024-03-31

## 2023-05-18 NOTE — Progress Notes (Signed)
Established Patient Office Visit  Subjective   Patient ID: Jeffrey Heitmann., male    DOB: Aug 19, 1961  Age: 62 y.o. MRN: [id number]  Chief Complaint  Patient presents with  . Medical Management of Chronic Issues    Patinet is here for follow up today on his blood sugar. Patient, patient reports he is feeling better overall, not falling, stil feels a little unstable but it seems better.   HTN-- patient has brought a typed list of his blood pressures and his blood sugars. State sthat the lisinopril caused arms to go numb, states that he stopped taking it and his BP stayed    Current Outpatient Medications  Medication Instructions  . Blood Glucose Monitoring Suppl DEVI 1 each, Does not apply, 3 times daily, May substitute to any manufacturer covered by patient's insurance.  . clobetasol ointment (TEMOVATE) 0.05 % 1 Application, Topical, 2 times daily  . empagliflozin (JARDIANCE) 25 mg, Oral, Daily before breakfast  . Glucose Blood (BLOOD GLUCOSE TEST STRIPS) STRP 1 each, In Vitro, Daily, May substitute to any manufacturer covered by patient's insurance.  . metFORMIN (GLUCOPHAGE) 1,000 mg, Oral, 2 times daily with meals  . olmesartan (BENICAR) 5 mg, Oral, Daily  . rosuvastatin (CRESTOR) 5 mg, Oral, Daily    Patient Active Problem List   Diagnosis Date Noted  . Type 2 diabetes mellitus with hyperglycemia, without long-term current use of insulin (HCC) 02/11/2023  . Elevated blood pressure reading 02/11/2023  . Psoriasis 07/28/2013  . Alcohol dependence with withdrawal (HCC) 06/24/2013  . Alcohol dependence (HCC) 04/18/2013  . Alcoholism /alcohol abuse 02/22/2013  . Alcohol withdrawal syndrome (HCC) 02/22/2013  . Elevated liver enzymes 02/22/2013  . Depression 02/22/2013      ROS    Objective:     BP (!) 128/90 (BP Location: Left Arm, Patient Position: Sitting, Cuff Size: Large)   Pulse (!) 105   Temp 98.4 F (36.9 C) (Oral)   Ht 5\' 11"  (1.803 m)   Wt 210 lb 4.8 oz  (95.4 kg)   SpO2 98%   BMI 29.33 kg/m  {Vitals History (Optional):23777}  Physical Exam Vitals reviewed.  Constitutional:      Appearance: Normal appearance. He is well-groomed and normal weight.  Eyes:     Extraocular Movements: Extraocular movements intact.     Conjunctiva/sclera: Conjunctivae normal.  Neck:     Thyroid: No thyromegaly.  Cardiovascular:     Rate and Rhythm: Normal rate and regular rhythm.     Heart sounds: S1 normal and S2 normal. No murmur heard. Pulmonary:     Effort: Pulmonary effort is normal.     Breath sounds: Normal breath sounds and air entry. No rales.  Abdominal:     General: Abdomen is flat. Bowel sounds are normal.  Musculoskeletal:     Right lower leg: No edema.     Left lower leg: No edema.  Neurological:     General: No focal deficit present.     Mental Status: He is alert and oriented to person, place, and time.     Gait: Gait is intact.  Psychiatric:        Mood and Affect: Mood and affect normal.     Results for orders placed or performed in visit on 05/18/23  POC HgB A1c  Result Value Ref Range   Hemoglobin A1C 8.0 (A) 4.0 - 5.6 %   HbA1c POC (<> result, manual entry)     HbA1c, POC (prediabetic range)  HbA1c, POC (controlled diabetic range)     Diabetic Foot Exam - Simple   Simple Foot Form Diabetic Foot exam was performed with the following findings: Yes 05/18/2023  3:03 PM  Visual Inspection No deformities, no ulcerations, no other skin breakdown bilaterally: Yes Sensation Testing Intact to touch and monofilament testing bilaterally: Yes Pulse Check Posterior Tibialis and Dorsalis pulse intact bilaterally: Yes Comments     {Labs (Optional):23779}  The 10-year ASCVD risk score (Arnett DK, et al., 2019) is: 34.9%    Assessment & Plan:  Type 2 diabetes mellitus with hyperglycemia, without long-term current use of insulin (HCC) -     POCT glycosylated hemoglobin (Hb A1C) -     Empagliflozin; Take 1 tablet (25 mg  total) by mouth daily before breakfast.  Dispense: 90 tablet; Refill: 1 -     Olmesartan Medoxomil; Take 1 tablet (5 mg total) by mouth daily.  Dispense: 90 tablet; Refill: 1 -     Rosuvastatin Calcium; Take 1 tablet (5 mg total) by mouth daily.  Dispense: 90 tablet; Refill: 3  Colon cancer screening -     Ambulatory referral to Gastroenterology  Tobacco dependence -     CT CHEST LUNG CANCER SCREENING LOW DOSE WO CONTRAST; Future  Psoriatic arthritis (HCC) -     Ambulatory referral to Rheumatology     Return in about 3 months (around 08/18/2023) for DM.    Karie Georges, MD

## 2023-05-19 DIAGNOSIS — L4050 Arthropathic psoriasis, unspecified: Secondary | ICD-10-CM | POA: Insufficient documentation

## 2023-05-19 DIAGNOSIS — I10 Essential (primary) hypertension: Secondary | ICD-10-CM | POA: Insufficient documentation

## 2023-05-19 NOTE — Assessment & Plan Note (Signed)
Switching from lisinopril to olmesartan today, he will continue to monitor his blood pressures at home.

## 2023-05-19 NOTE — Assessment & Plan Note (Signed)
Rash is spreading and cream is not working like it used to, I think he would be an excellent candidate for DMARD therapy. I will place the referral to rheumatology for him today.

## 2023-05-19 NOTE — Assessment & Plan Note (Signed)
A1C is dramatically better today with medication and dietary changes, foot exam normal. Will increase the jardiance to 25 mg daily, change the lisinopril to olmesartan.  We discussed adding a small dose of rosuvastatin to help lower his risk of CAD and he is agreeable, will start 5 mg daily and see him back in 3 months to recheck all of his labs.

## 2023-05-20 ENCOUNTER — Telehealth: Payer: Self-pay | Admitting: *Deleted

## 2023-05-20 NOTE — Telephone Encounter (Signed)
Message sent from referral coordinator as below:  General 05/20/2023  3:30 PM Leak, Josue Hector Auto: Referral message -  Note: ----- Message ----- From: Kermit Balo Sent: 05/20/2023   3:30 PM EDT To: Lisa Roca     ----- Message ----- From: Henriette Combs, LPN Sent: 4/0/9811   4:14 PM EDT To: Cammie Mcgee, MD   Due to patient's history of Psoriatic Arthritis, please arrange with patient to obtain and send previous rheumatology records for review. We will be unable to complete the review process until the records have been received. Thanks!      05/20/2023 at 3:48pm-I left a detailed message at the patient's cell number with the information above and advised he have the previous records faxed to [facility] at [phone].

## 2023-05-25 ENCOUNTER — Other Ambulatory Visit: Payer: Self-pay | Admitting: Family Medicine

## 2023-05-25 DIAGNOSIS — E1165 Type 2 diabetes mellitus with hyperglycemia: Secondary | ICD-10-CM

## 2023-06-08 NOTE — Telephone Encounter (Signed)
Message was received from [place], referral coordinator checking the status of th records.  I spoke with the patient and informed him of the message below again.  Patient stated he was seen by a dermatologist years ago that referred him to a rheumatologist and he did not go to the appt.  Patient was advised to contact the dermatology office to have records faxed t [facility] as below.  Message forwarded to the referral coordinator.

## 2023-06-08 NOTE — Telephone Encounter (Signed)
Pt is calling and he has never been to [facility] he will have an appt with [facility]  and would like to know if he should have his records from dermatologist office  fax to [facility] . Pt also would like jo ann to return his call

## 2023-06-12 NOTE — Telephone Encounter (Signed)
Please have pt to fax records to [facility] from dermatologist.

## 2023-06-16 NOTE — Telephone Encounter (Signed)
Spoke with the patient and informed him of the message below.  Patient stated he is not aware of why the records are needed by [facility] since he already has an appt with Dr Dimple Casey on [facility].  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  I advised the patient I am not here to discuss the politics of what [facility] is doing and again advised he contact the rheumatology office he has an appt with, ask if they need the notes from the dermatologist and if not, to disregard the message as this what was sent from the referral coordinator per the rheumatology's office policy.

## 2023-06-18 ENCOUNTER — Telehealth: Payer: Self-pay | Admitting: Family Medicine

## 2023-06-18 NOTE — Telephone Encounter (Signed)
Pt called to say CMA keeps calling him requesting information regarding the Rheumatology referral for [facility].  Pt states he already has an appointment scheduled to see. Dr. Dimple Casey at  [facility] [address]  Pt added that he called [facility] and they have no record of his referral.   Please advise.

## 2023-08-18 ENCOUNTER — Ambulatory Visit: Payer: Medicaid Other | Admitting: Family Medicine

## 2023-09-24 ENCOUNTER — Ambulatory Visit: Payer: Medicaid Other | Admitting: Family Medicine

## 2023-09-24 ENCOUNTER — Encounter: Payer: Self-pay | Admitting: Family Medicine

## 2023-09-24 VITALS — BP 116/80 | HR 105 | Temp 97.8°F | Ht 71.0 in | Wt 207.5 lb

## 2023-09-24 DIAGNOSIS — Z7984 Long term (current) use of oral hypoglycemic drugs: Secondary | ICD-10-CM

## 2023-09-24 DIAGNOSIS — I10 Essential (primary) hypertension: Secondary | ICD-10-CM

## 2023-09-24 DIAGNOSIS — Z1211 Encounter for screening for malignant neoplasm of colon: Secondary | ICD-10-CM

## 2023-09-24 DIAGNOSIS — E1165 Type 2 diabetes mellitus with hyperglycemia: Secondary | ICD-10-CM | POA: Diagnosis not present

## 2023-09-24 DIAGNOSIS — F1091 Alcohol use, unspecified, in remission: Secondary | ICD-10-CM | POA: Insufficient documentation

## 2023-09-24 LAB — POCT GLYCOSYLATED HEMOGLOBIN (HGB A1C): Hemoglobin A1C: 7.1 % — AB (ref 4.0–5.6)

## 2023-09-24 MED ORDER — METFORMIN HCL 500 MG PO TABS: 1000.0000 mg | ORAL_TABLET | Freq: Two times a day (BID) | ORAL | 5 refills | Status: DC

## 2023-09-24 MED ORDER — OLMESARTAN MEDOXOMIL 5 MG PO TABS: 5.0000 mg | ORAL_TABLET | Freq: Every day | ORAL | 1 refills | Status: DC

## 2023-09-24 MED ORDER — EMPAGLIFLOZIN 25 MG PO TABS: 25.0000 mg | ORAL_TABLET | Freq: Every day | ORAL | 1 refills | Status: DC

## 2023-09-24 NOTE — Progress Notes (Signed)
Established Patient Office Visit  Subjective   Patient ID: Jeffrey Black., male    DOB: 03/14/1961  Age: 62 y.o. MRN: [id number]  Chief Complaint  Patient presents with   Medical Management of Chronic Issues    Pt is here for follow up on DM and HTN today  DM-- pt on metformin 1000 BID and jardiance 25 mg daily. He reports          A1C performed in office and is 7.1 which is better than previous. He denies any side effects from the medication. Has lost a total of 15 pounds since march. States that he occasionally has balance issues but is otherwise not having any symptoms.   HTN -- BP in office performed and is well controlled. He  reports no side effects to the medications, no chest pain, SOB, dizziness or headaches. He has a BP cuff at home and is checking BP regularly, reports they are in the normal range.        Current Outpatient Medications  Medication Instructions   Blood Glucose Monitoring Suppl DEVI 1 each, Does not apply, 3 times daily, May substitute to any manufacturer covered by patient's insurance.   clobetasol ointment (TEMOVATE) 0.05 % 1 Application, Topical, 2 times daily   empagliflozin (JARDIANCE) 25 mg, Oral, Daily before breakfast   Glucose Blood (BLOOD GLUCOSE TEST STRIPS) STRP 1 each, In Vitro, Daily, May substitute to any manufacturer covered by patient's insurance.   metFORMIN (GLUCOPHAGE) 1,000 mg, Oral, 2 times daily with meals   olmesartan (BENICAR) 5 mg, Oral, Daily   rosuvastatin (CRESTOR) 5 mg, Oral, Daily    Patient Active Problem List   Diagnosis Date Noted   Alcohol use disorder in remission 09/24/2023   Psoriatic arthritis (HCC) 05/19/2023   Hypertension 05/19/2023   Type 2 diabetes mellitus with hyperglycemia, without long-term current use of insulin (HCC) 02/11/2023   Elevated blood pressure reading 02/11/2023   Psoriasis 07/28/2013   Elevated liver enzymes 02/22/2013   Depression 02/22/2013      Review of Systems  All other  systems reviewed and are negative.     Objective:     BP 116/80 (BP Location: Left Arm, Patient Position: Sitting, Cuff Size: Large)   Pulse (!) 105   Temp 97.8 F (36.6 C) (Axillary)   Ht 5\' 11"  (1.803 m)   Wt 207 lb 8 oz (94.1 kg)   SpO2 96%   BMI 28.94 kg/m    Physical Exam Vitals reviewed.  Constitutional:      Appearance: Normal appearance. He is well-groomed and normal weight.  Neck:     Thyroid: No thyromegaly.  Cardiovascular:     Rate and Rhythm: Normal rate and regular rhythm.     Heart sounds: S1 normal and S2 normal. No murmur heard. Pulmonary:     Effort: Pulmonary effort is normal.     Breath sounds: Normal breath sounds and air entry. No rales.  Musculoskeletal:     Right lower leg: No edema.     Left lower leg: No edema.  Neurological:     General: No focal deficit present.     Mental Status: He is alert and oriented to person, place, and time.     Gait: Gait is intact.  Psychiatric:        Mood and Affect: Mood and affect normal.      Results for orders placed or performed in visit on 09/24/23  POC HgB A1c  Result Value Ref  Range   Hemoglobin A1C 7.1 (A) 4.0 - 5.6 %   HbA1c POC (<> result, manual entry)     HbA1c, POC (prediabetic range)     HbA1c, POC (controlled diabetic range)        The 10-year ASCVD risk score (Arnett DK, et al., 2019) is: 34.4%    Assessment & Plan:  Type 2 diabetes mellitus with hyperglycemia, without long-term current use of insulin (HCC) Assessment & Plan: Chronic, AC1C is 7.1 which is well controlled, will continue his medications as prescribed. He is due for his blood work to be done today.   Orders: -     POCT glycosylated hemoglobin (Hb A1C) -     Comprehensive metabolic panel; Future -     Lipid panel; Future -     Microalbumin / creatinine urine ratio; Future -     Olmesartan Medoxomil; Take 1 tablet (5 mg total) by mouth daily.  Dispense: 90 tablet; Refill: 1 -     metFORMIN HCl; Take 2 tablets  (1,000 mg total) by mouth 2 (two) times daily with a meal.  Dispense: 120 tablet; Refill: 5 -     Empagliflozin; Take 1 tablet (25 mg total) by mouth daily before breakfast.  Dispense: 90 tablet; Refill: 1  Colon cancer screening -     Ambulatory referral to Gastroenterology  Alcohol use disorder in remission Assessment & Plan: Pt has been sober for at least 7-8 years, is working towards getting his driver's license back. Advised him that I would be happy to fill out paperwork.   Primary hypertension Assessment & Plan: BP is doing well today on the 5 mg olmesartan, will continue as prescribed.       Return in about 6 months (around 03/23/2024) for DM, HTN.    Karie Georges, MD

## 2023-09-25 NOTE — Assessment & Plan Note (Signed)
BP is doing well today on the 5 mg olmesartan, will continue as prescribed.

## 2023-09-25 NOTE — Assessment & Plan Note (Signed)
Chronic, AC1C is 7.1 which is well controlled, will continue his medications as prescribed. He is due for his blood work to be done today.

## 2023-09-25 NOTE — Assessment & Plan Note (Signed)
Pt has been sober for at least 7-8 years, is working towards getting his driver's license back. Advised him that I would be happy to fill out paperwork.

## 2023-11-16 ENCOUNTER — Encounter: Payer: Medicaid Other | Admitting: Internal Medicine

## 2023-11-26 ENCOUNTER — Encounter: Payer: Self-pay | Admitting: Internal Medicine

## 2023-11-26 ENCOUNTER — Ambulatory Visit: Payer: Medicaid Other

## 2023-11-26 ENCOUNTER — Ambulatory Visit: Payer: Medicaid Other | Attending: Internal Medicine | Admitting: Internal Medicine

## 2023-11-26 VITALS — BP 135/88 | HR 91 | Resp 14 | Ht 71.25 in | Wt 209.0 lb

## 2023-11-26 DIAGNOSIS — G8929 Other chronic pain: Secondary | ICD-10-CM | POA: Diagnosis not present

## 2023-11-26 DIAGNOSIS — L4050 Arthropathic psoriasis, unspecified: Secondary | ICD-10-CM | POA: Diagnosis not present

## 2023-11-26 DIAGNOSIS — L409 Psoriasis, unspecified: Secondary | ICD-10-CM | POA: Diagnosis not present

## 2023-11-26 DIAGNOSIS — Z79899 Other long term (current) drug therapy: Secondary | ICD-10-CM | POA: Diagnosis not present

## 2023-11-26 DIAGNOSIS — M25561 Pain in right knee: Secondary | ICD-10-CM | POA: Diagnosis not present

## 2023-11-26 MED ORDER — TRIAMCINOLONE ACETONIDE 0.1 % EX CREA
1.0000 | TOPICAL_CREAM | Freq: Two times a day (BID) | CUTANEOUS | 0 refills | Status: DC | PRN
Start: 2023-11-26 — End: 2024-06-01

## 2023-11-26 NOTE — Patient Instructions (Signed)
 Clobetasol  is an ultra potent steroid that can cause permanent discoloration or thinning to sensitive skin areas such as the face and groin. I recommend using this not more than 2 days per week. I am sending a prescription for triamcinolone  0.1.% which is a moderate strength medicine safer for the face.

## 2023-11-26 NOTE — Progress Notes (Signed)
 Office Visit Note  Patient: Jeffrey Black.             Date of Birth: March 08, 1961           MRN: [id number]             PCP: [name], MD Referring: [name], MD Visit Date: 11/26/2023   Subjective:  New Patient (Initial Visit) (Patient states he has joint pain in his right knee. Patient states he used to have pain in his finger and right shoulder but not anymore. Patient states his joint pain moves around. )   History of Present Illness:  Discussed the use of AI scribe software for clinical note transcription with the patient, who gave verbal consent to proceed.  History of Present Illness   Jeffrey Black. is a 63 y.o. male here for evaluation and management of psoriasis and psoriatic arthritis.  He has a history of psoriasis diagnosed in 2004, presents with worsening skin and joint symptoms. They report a significant flare-up of their psoriasis in 2007, which was managed by a dermatologist. However, due to insurance limitations, they were unable to access biologic therapies such as Stelara, Humira, and Enbrel. Instead, they were treated with methotrexate and topical treatments.  In 2010, the patient began experiencing joint pain, initially in the right shoulder, which later migrated to the left shoulder. The pain was intermittent, lasting for several weeks at a time before subsiding. The patient reports that the joint pain disappeared for a period of approximately eight years, from 2016 to 2024, but has recently returned. The pain is now localized in the right knee and a finger, with no visible swelling noted.  The patient's psoriasis has also worsened over time, with the rash now covering the torso, buttocks, and thighs. Recently, the patient has noticed the rash appearing on their face, which is a new development in the past six months. The patient reports that the severity of the rash and joint pain seems to fluctuate in tandem.  The patient has been  managing their symptoms with over-the-counter treatments and a topical medication, Taclonex, but reports that these treatments only provide temporary relief. The patient has not seen a dermatologist recently and is not currently on any prescribed medication for arthritis.  The patient has a history of heavy alcohol  use, which resulted in a diagnosis of fatty liver in 2014. They have been abstinent from alcohol  for the past ten years. The patient has no history of joint injuries, surgeries, or gout. They have no family history of autoimmune conditions, although their mother and sister both had scalp psoriasis.  The patient lives alone and is concerned about the potential impact of their worsening symptoms on their ability to maintain independence. They are seeking treatment options to manage their psoriasis and joint pain.    Activities of Daily Living:  Patient reports morning stiffness for 30 minutes.   Patient Denies nocturnal pain.  Difficulty dressing/grooming: Denies Difficulty climbing stairs: Reports Difficulty getting out of chair: Reports Difficulty using hands for taps, buttons, cutlery, and/or writing: Denies  Review of Systems  Constitutional:  Negative for fatigue.  HENT:  Negative for mouth sores and mouth dryness.   Eyes:  Negative for dryness.  Respiratory:  Negative for shortness of breath.   Cardiovascular:  Negative for chest pain and palpitations.  Gastrointestinal:  Negative for blood in stool, constipation and diarrhea.  Endocrine: Negative for increased urination.  Genitourinary:  Negative for involuntary urination.  Musculoskeletal:  Positive for joint pain, gait problem, joint pain and morning stiffness. Negative for joint swelling, myalgias, muscle weakness, muscle tenderness and myalgias.  Skin:  Positive for rash. Negative for color change, hair loss and sensitivity to sunlight.  Allergic/Immunologic: Negative for susceptible to infections.  Neurological:   Negative for dizziness and headaches.  Hematological:  Negative for swollen glands.  Psychiatric/Behavioral:  Negative for depressed mood and sleep disturbance. The patient is not nervous/anxious.     PMFS History:  Patient Active Problem List   Diagnosis Date Noted   High risk medication use 11/26/2023   Alcohol  use disorder in remission 09/24/2023   Psoriatic arthritis (HCC) 05/19/2023   Hypertension 05/19/2023   Type 2 diabetes mellitus with hyperglycemia, without long-term current use of insulin  (HCC) 02/11/2023   Elevated blood pressure reading 02/11/2023   Psoriasis 07/28/2013   Elevated liver enzymes 02/22/2013   Depression 02/22/2013    Past Medical History:  Diagnosis Date   Depression    Diabetes (HCC)    ETOH abuse    History of bronchitis    Hypertension    Psoriasis     Family History  Problem Relation Age of Onset   Diabetes Mother    Diabetes type II Mother    Kidney disease Father    Hypertension Father    Hyperlipidemia Father    Heart disease Father    Stroke Father    CVA Father    CVA Maternal Grandmother    Hypertension Maternal Grandmother    Hyperlipidemia Maternal Grandmother    Heart disease Maternal Grandmother    Diabetes Maternal Grandmother    Hypertension Maternal Grandfather    Hyperlipidemia Maternal Grandfather    Heart disease Maternal Grandfather    Diabetes Maternal Grandfather    Hyperlipidemia Paternal Grandmother    Heart disease Paternal Grandmother    Hyperlipidemia Paternal Grandfather    Heart disease Paternal Grandfather    Diabetes Paternal Grandfather    Depression Paternal Grandfather    Cancer - Colon Other    Past Surgical History:  Procedure Laterality Date   ESOPHAGOGASTRODUODENOSCOPY N/A 05/03/2013   Procedure: ESOPHAGOGASTRODUODENOSCOPY (EGD);  Surgeon: [name], MD;  Location: [facility];  Service: Endoscopy;  Laterality: N/A;   left 4th finger surgery     Social History   Social History  Narrative   Not on file   Immunization History  Administered Date(s) Administered   PFIZER(Purple Top)SARS-COV-2 Vaccination 01/13/2020, 09/11/2020   Pneumococcal Polysaccharide-23 07/24/2013   Tdap 11/08/2012     Objective: Vital Signs: BP 135/88 (BP Location: Right Arm, Patient Position: Sitting, Cuff Size: Normal)   Pulse 91   Resp 14   Ht 5' 11.25 (1.81 m)   Wt 209 lb (94.8 kg)   BMI 28.95 kg/m    Physical Exam Eyes:     Conjunctiva/sclera: Conjunctivae normal.  Cardiovascular:     Rate and Rhythm: Normal rate and regular rhythm.  Pulmonary:     Effort: Pulmonary effort is normal.     Breath sounds: Normal breath sounds.  Musculoskeletal:     Right lower leg: No edema.     Left lower leg: No edema.  Lymphadenopathy:     Cervical: No cervical adenopathy.  Skin:    General: Skin is warm and dry.     Findings: Rash present.     Comments: Extensive plaque psoriasis involving face, abdomen, low back, umbilicus, thighs, elbows, knees, and shins  Neurological:     Mental Status: He  is alert.  Psychiatric:        Mood and Affect: Mood normal.      Musculoskeletal Exam:  Shoulders full ROM no tenderness or swelling Elbows full ROM no tenderness or swelling Wrists full ROM no tenderness or swelling Fingers full ROM no tenderness or swelling Knees full ROM, right knee joint line pain with passive and active ROM no palpable effusion Ankles full ROM no tenderness or swelling  Investigation: No additional findings.  Imaging: XR KNEE 3 VIEW RIGHT Result Date: 11/30/2023 X-ray right knee 3 views There is probable mild joint space narrowing the medial and lateral compartments.  No marginal osteophyte.  No erosion or abnormal calcifications seen.  No visible effusion or other soft tissue swelling. Impression No visible erosion or soft tissue swelling, possibly mild osteoarthritis    Recent Labs: Lab Results  Component Value Date   WBC 9.3 11/26/2023   HGB 14.0  11/26/2023   PLT 378 11/26/2023   NA 135 11/26/2023   K 5.5 (H) 11/26/2023   CL 100 11/26/2023   CO2 26 11/26/2023   GLUCOSE 94 11/26/2023   BUN 12 11/26/2023   CREATININE 0.79 11/26/2023   BILITOT 0.4 11/26/2023   ALKPHOS 98 02/09/2023   AST 14 11/26/2023   ALT 14 11/26/2023   PROT 7.6 11/26/2023   ALBUMIN  4.4 02/09/2023   CALCIUM  9.8 11/26/2023   GFRAA >60 06/02/2015   QFTBGOLDPLUS NEGATIVE 11/26/2023    Speciality Comments: No specialty comments available.  Procedures:  No procedures performed Allergies: Patient has no known allergies.   Assessment / Plan:     Visit Diagnoses: Psoriatic arthritis (HCC) - Plan: Sedimentation rate, C-reactive protein History of episodic joint pain, currently affecting the right knee. No visible swelling. Previous episodes affected the shoulders and a finger. No definite synovitis or dactylitis on the physical exam today.  Does describe previous episodes with joint pain and swelling such as the distal finger joints and knees that are suggestive. -Checking sed rate and CRP for systemic disease activity assessment -Plan to initiate systemic therapy with a biologic medication. Preference for IL-23 inhibitors such as Tremfya due to their specificity for psoriasis and less overall immunosuppression compared to older biologics. -Order labs for baseline screening for chronic infections (hepatitis, tuberculosis) prior to starting biologic therapy.  Psoriasis - Plan: triamcinolone  cream (KENALOG ) 0.1 % Severe plaque psoriasis with recent worsening and new facial involvement. Previously managed with topical treatments (Taclonex, Clobetasol  Propionate) with limited success.   -Rx topical tacrolimus 0.1% for recurring facial rash in short term  High risk medication use - Plan: Hepatitis B core antibody, IgM, Hepatitis B surface antigen, Hepatitis C antibody, QuantiFERON-TB Gold Plus, CBC with Differential/Platelet, COMPLETE METABOLIC PANEL WITH GFR No  history of recurrent or serious infection complications.  Does have a history of alcohol  abuse so could benefit minimizing hepatotoxic medication. -Checking baseline labs for DMARD medication start including blood count metabolic panel hepatitis screening and QuantiFERON  Chronic pain of right knee - Plan: XR KNEE 3 VIEW RIGHT, Uric acid Most persistent pain at the knee despite relatively stable symptoms elsewhere.  Checking the knee shows probable very mild osteoarthritis.  Pain does seem worse than imaging would indicate could be related to inflammatory disease but wait to see response to systemic therapy before pursuing any local procedures or advanced testing  Orders: Orders Placed This Encounter  Procedures   XR KNEE 3 VIEW RIGHT   Sedimentation rate   C-reactive protein   Hepatitis B core  antibody, IgM   Hepatitis B surface antigen   Hepatitis C antibody   QuantiFERON-TB Gold Plus   Uric acid   CBC with Differential/Platelet   COMPLETE METABOLIC PANEL WITH GFR   Meds ordered this encounter  Medications   triamcinolone  cream (KENALOG ) 0.1 %    Sig: Apply 1 Application topically 2 (two) times daily as needed.    Dispense:  30 g    Refill:  0     Follow-Up Instructions: Return in about 3 months (around 02/24/2024) for New pt PsA GUS/?COS start f/u 3mos.   Lonni LELON Ester, MD  Note - This record has been created using [employer].  Chart creation errors have been sought, but may not always  have been located. Such creation errors do not reflect on  the standard of medical care.

## 2023-11-29 LAB — CBC WITH DIFFERENTIAL/PLATELET
Absolute Lymphocytes: 1823 {cells}/uL (ref 850–3900)
Absolute Monocytes: 744 {cells}/uL (ref 200–950)
Basophils Absolute: 93 {cells}/uL (ref 0–200)
Basophils Relative: 1 %
Eosinophils Absolute: 242 {cells}/uL (ref 15–500)
Eosinophils Relative: 2.6 %
HCT: 42.9 % (ref 38.5–50.0)
Hemoglobin: 14 g/dL (ref 13.2–17.1)
MCH: 28 pg (ref 27.0–33.0)
MCHC: 32.6 g/dL (ref 32.0–36.0)
MCV: 85.8 fL (ref 80.0–100.0)
MPV: 8.6 fL (ref 7.5–12.5)
Monocytes Relative: 8 %
Neutro Abs: 6398 {cells}/uL (ref 1500–7800)
Neutrophils Relative %: 68.8 %
Platelets: 378 10*3/uL (ref 140–400)
RBC: 5 10*6/uL (ref 4.20–5.80)
RDW: 13.6 % (ref 11.0–15.0)
Total Lymphocyte: 19.6 %
WBC: 9.3 10*3/uL (ref 3.8–10.8)

## 2023-11-29 LAB — COMPLETE METABOLIC PANEL WITH GFR
AG Ratio: 1.6 (calc) (ref 1.0–2.5)
ALT: 14 U/L (ref 9–46)
AST: 14 U/L (ref 10–35)
Albumin: 4.7 g/dL (ref 3.6–5.1)
Alkaline phosphatase (APISO): 91 U/L (ref 35–144)
BUN: 12 mg/dL (ref 7–25)
CO2: 26 mmol/L (ref 20–32)
Calcium: 9.8 mg/dL (ref 8.6–10.3)
Chloride: 100 mmol/L (ref 98–110)
Creat: 0.79 mg/dL (ref 0.70–1.35)
Globulin: 2.9 g/dL (ref 1.9–3.7)
Glucose, Bld: 94 mg/dL (ref 65–99)
Potassium: 5.5 mmol/L — ABNORMAL HIGH (ref 3.5–5.3)
Sodium: 135 mmol/L (ref 135–146)
Total Bilirubin: 0.4 mg/dL (ref 0.2–1.2)
Total Protein: 7.6 g/dL (ref 6.1–8.1)
eGFR: 100 mL/min/{1.73_m2} (ref >=60)

## 2023-11-29 LAB — HEPATITIS B SURFACE ANTIGEN: Hepatitis B Surface Ag: NONREACTIVE

## 2023-11-29 LAB — C-REACTIVE PROTEIN: CRP: 11 mg/L — ABNORMAL HIGH (ref <8.0)

## 2023-11-29 LAB — QUANTIFERON-TB GOLD PLUS
Mitogen-NIL: >10 [IU]/mL
NIL: 0.03 [IU]/mL
QuantiFERON-TB Gold Plus: NEGATIVE
TB1-NIL: 0 [IU]/mL
TB2-NIL: 0 [IU]/mL

## 2023-11-29 LAB — SEDIMENTATION RATE: Sed Rate: 9 mm/h (ref 0–20)

## 2023-11-29 LAB — URIC ACID: Uric Acid, Serum: 4.3 mg/dL (ref 4.0–8.0)

## 2023-11-29 LAB — HEPATITIS C ANTIBODY: Hepatitis C Ab: NONREACTIVE

## 2023-11-29 LAB — HEPATITIS B CORE ANTIBODY, IGM: Hep B C IgM: NONREACTIVE

## 2023-12-09 ENCOUNTER — Encounter: Payer: Self-pay | Admitting: Internal Medicine

## 2023-12-10 ENCOUNTER — Telehealth: Payer: Self-pay | Admitting: Pharmacist

## 2023-12-10 NOTE — Telephone Encounter (Addendum)
Submitted a Prior Authorization request to [facility] MEDICAID for [facility] via CoverMyMeds. Will update once we receive a response.  Key: [id number]  Appears that Cosentyx, Enbrel, or Humira may be preferred  Chesley Mires, PharmD, MPH, BCPS, CPP Clinical Pharmacist (Rheumatology and Pulmonology)  ----- Message ----- From: Fuller Plan, MD Sent: 12/09/2023  10:11 AM EST To: Murrell Redden, RPH-CPP Subject: Tremfya                                        Performed as a new patient with severely active plaque psoriasis and also appears to have some degree of active psoriatic arthritis right now.  I recommend we start him on Tremfya.  If IL 23 is not a first-line option for him would also be fine to start on Cosentyx or Taltz. Thanks.

## 2023-12-14 ENCOUNTER — Telehealth: Payer: Self-pay | Admitting: Pharmacist

## 2023-12-14 DIAGNOSIS — L409 Psoriasis, unspecified: Secondary | ICD-10-CM

## 2023-12-14 DIAGNOSIS — L4050 Arthropathic psoriasis, unspecified: Secondary | ICD-10-CM

## 2023-12-14 DIAGNOSIS — Z79899 Other long term (current) drug therapy: Secondary | ICD-10-CM

## 2023-12-14 NOTE — Telephone Encounter (Signed)
Tremfya denied.  Submitted a Prior Authorization request to [facility] MEDICAID for COSENTYX SQ via CoverMyMeds. Will update once we receive a response.  Key: [id number]

## 2023-12-14 NOTE — Telephone Encounter (Signed)
Received a fax regarding Prior Authorization from [facility] MEDICAID for [facility]. Authorization has been DENIED because one of the following must be met (A) You have tried Cosentyx, Enbrel or Humira. (B) There is a clinical reason, you cannot try Cosentyx, Enbrel or Humira..  Will move forward with Cosentyx and provide counseling at new start visit  Chesley Mires, PharmD, MPH, BCPS, CPP Clinical Pharmacist (Rheumatology and Pulmonology)

## 2023-12-22 NOTE — Telephone Encounter (Signed)
 Received notification from [facility] MEDICAID regarding a prior authorization for COSENTYX  SQ. Authorization has been APPROVED from 12/14/23 to 12/13/24. Approval letter sent to scan center.  Per test claim, copay for 28 days supply is $4  Patient can fill through [facility]: [phone]   Authorization # [id number]  Patient cna be schedule for Cosentyx  new start  Jeffrey Black, PharmD, MPH, BCPS, CPP Clinical Pharmacist (Rheumatology and Pulmonology)

## 2023-12-23 NOTE — Telephone Encounter (Signed)
 ATC patient regarding Cosentyx  new start visit (Tremfya denied). Unable to reach. Left VM with my callback number  Geraldene Kleine, PharmD, MPH, BCPS, CPP Clinical Pharmacist (Rheumatology and Pulmonology)

## 2023-12-24 MED ORDER — COSENTYX UNOREADY 300 MG/2ML ~~LOC~~ SOAJ
300.0000 mg | SUBCUTANEOUS | 0 refills | Status: DC
  Filled 2024-01-06: qty 8, 28d supply, fill #0

## 2023-12-24 NOTE — Telephone Encounter (Signed)
 Patient returned call - he states he'll call next week to schedule appointment because he has to coordinate with driver. Advised him of my clinic hours.  Geraldene Kleine, PharmD, MPH, BCPS, CPP Clinical Pharmacist (Rheumatology and Pulmonology)

## 2023-12-31 NOTE — Telephone Encounter (Signed)
ATC patient regarding Cosentyx new start. Unable to reach. Left VM requesting return call to schedule appt after coordinating with driver

## 2024-01-05 NOTE — Telephone Encounter (Signed)
 Patient reached out - Cosentyx SQ new start scheduled for 01/12/24. Patient denies active infection, antibiotic use, and any upcoming surgeries  Chesley Mires, PharmD, MPH, BCPS, CPP Clinical Pharmacist (Rheumatology and Pulmonology)

## 2024-01-06 ENCOUNTER — Other Ambulatory Visit: Payer: Self-pay

## 2024-01-06 ENCOUNTER — Other Ambulatory Visit (HOSPITAL_COMMUNITY): Payer: Self-pay

## 2024-01-06 NOTE — Progress Notes (Signed)
 Specialty Pharmacy Initial Fill Coordination Note  Jeffrey Black. is a 63 y.o. male contacted today regarding initial fill of specialty medication(s) Secukinumab (Cosentyx Jeffrey Black)   Patient requested Courier to Provider Office   Delivery date: 01/11/24   Verified address: [address] [place], [place] [address]   Medication will be filled on 01/08/24.   Patient is aware of $4 copayment. Pt requests copay be billed to AR account.

## 2024-01-08 ENCOUNTER — Other Ambulatory Visit (HOSPITAL_COMMUNITY): Payer: Self-pay

## 2024-01-08 ENCOUNTER — Other Ambulatory Visit: Payer: Self-pay

## 2024-01-12 ENCOUNTER — Ambulatory Visit: Payer: Medicaid Other | Attending: Internal Medicine | Admitting: Pharmacist

## 2024-01-12 ENCOUNTER — Other Ambulatory Visit: Payer: Self-pay

## 2024-01-12 DIAGNOSIS — L4050 Arthropathic psoriasis, unspecified: Secondary | ICD-10-CM

## 2024-01-12 DIAGNOSIS — Z7189 Other specified counseling: Secondary | ICD-10-CM

## 2024-01-12 DIAGNOSIS — L409 Psoriasis, unspecified: Secondary | ICD-10-CM

## 2024-01-12 DIAGNOSIS — Z79899 Other long term (current) drug therapy: Secondary | ICD-10-CM

## 2024-01-12 MED ORDER — COSENTYX UNOREADY 300 MG/2ML ~~LOC~~ SOAJ
300.0000 mg | SUBCUTANEOUS | 1 refills | Status: DC
  Filled 2024-01-12 – 2024-01-27 (×3): qty 2, 28d supply, fill #0

## 2024-01-12 NOTE — Patient Instructions (Signed)
 Your next COSENTYX SQ dose is due on 01/19/24, 01/26/24, 02/02/24, 02/09/24, then every 4 weeks thereafter (starting on 03/08/2024)  CONTINUE your topical treatments for psoriasis  HOLD COSENTYX SQ if you have signs or symptoms of an infection. You can resume once you feel better or back to your baseline. HOLD COSENTYX SQ if you start antibiotics to treat an infection. HOLD COSENTYX SQ around the time of surgery/procedures. Your surgeon will be able to provide recommendations on when to hold BEFORE and when you are cleared to RESUME.  Pharmacy information: Your prescription will be shipped from [facility]. Their phone number is [phone] They will call to schedule shipment and confirm address. They will mail your medication to your home.  Labs are due in 1 month then every 3 months. Lab hours are from Monday to Thursday 8am-12:30pm and 1pm-5pm and Friday 8am-12pm. You do not need an appointment if you come for labs during these times. If you'd like to go to a [facility] or [facility] closer to home, please call our clinic 48 hours prior to lab date so we can release orders in a timely manner.  Stay up to date on all routine vaccines: influenza, pneumonia, COVID19, Shingles  How to manage an injection site reaction: Remember the 5 C's: COUNTER - leave on the counter at least 30 minutes but up to overnight to bring medication to room temperature. This may help prevent stinging COLD - place something cold (like an ice gel pack or cold water bottle) on the injection site just before cleansing with alcohol. This may help reduce pain CLARITIN - use Claritin (generic name is loratadine) for the first two weeks of treatment or the day of, the day before, and the day after injecting. This will help to minimize injection site reactions CORTISONE CREAM - apply if injection site is irritated and itching CALL ME - if injection site reaction is bigger than the size of your fist, looks infected,  blisters, or if you develop hives

## 2024-01-12 NOTE — Progress Notes (Unsigned)
 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 He is accompanied by his sister, Joyce Gross. Patient previously treated with methotrexate and topical therapies due to insurance limitation  He has psoriasis on elbow, abdomen, face. He is currently off of topical treatment because of diffuse psoriasis and feels not effective for severity of his psoriasis at this time.  Plan was to start Tremfya but insurance did not prefer.  Patient running a fever or have signs/symptoms of infection? No  Patient currently on antibiotics for the treatment of infection? No  Patient have any upcoming invasive procedures/surgeries? No  Objective: CMP     Component Value Date/Time   NA 135 11/26/2023 1446   K 5.5 (H) 11/26/2023 1446   CL 100 11/26/2023 1446   CO2 26 11/26/2023 1446   GLUCOSE 94 11/26/2023 1446   BUN 12 11/26/2023 1446   CREATININE 0.79 11/26/2023 1446   CALCIUM 9.8 11/26/2023 1446   PROT 7.6 11/26/2023 1446   ALBUMIN 4.4 02/09/2023 1037   AST 14 11/26/2023 1446   ALT 14 11/26/2023 1446   ALKPHOS 98 02/09/2023 1037   BILITOT 0.4 11/26/2023 1446   GFRNONAA >60 06/02/2015 1631   GFRAA >60 06/02/2015 1631    CBC    Component Value Date/Time   WBC 9.3 11/26/2023 1446   RBC 5.00 11/26/2023 1446   HGB 14.0 11/26/2023 1446   HCT 42.9 11/26/2023 1446   PLT 378 11/26/2023 1446   MCV 85.8 11/26/2023 1446   MCH 28.0 11/26/2023 1446   MCHC 32.6 11/26/2023 1446   RDW 13.6 11/26/2023 1446   LYMPHSABS 2.2 06/02/2015 1631   MONOABS 0.4 06/02/2015 1631   EOSABS 242 11/26/2023 1446   BASOSABS 93 11/26/2023 1446    Baseline Immunosuppressant Therapy Labs TB GOLD    Latest Ref Rng & Units 11/26/2023    2:46 PM  Quantiferon TB Gold  Quantiferon TB Gold Plus NEGATIVE NEGATIVE    Hepatitis Panel    Latest Ref Rng & Units 11/26/2023    2:46 PM  Hepatitis  Hep B Surface Ag NON-REACTIVE NON-REACTIVE   Hep B IgM  NON-REACTIVE NON-REACTIVE   Hep C Ab NON-REACTIVE NON-REACTIVE    HIV Lab Results  Component Value Date   HIV NON REACTIVE 07/29/2013   Immunoglobulins   SPEP    Latest Ref Rng & Units 11/26/2023    2:46 PM  Serum Protein Electrophoresis  Total Protein 6.1 - 8.1 g/dL 7.6    Chest x-ray: 7/82/9562 - 1. No acute abnormality detected.  2. New small nodular opacity at the left base. In the absence of infectious history, this is most likely a small focus of atelectasis or pneumonitis.   Assessment/Plan:  Counseled patient that Cosentyx is a IL-17 inhibitor.  Counseled patient on purpose, proper use, and adverse effects of Cosentyx. Reviewed the most common adverse effects of infection (more commonly nasopharyngitis, URTI), inflammatory bowel disease, and allergic reaction.  Counseled patient that Cosentyx should be held prior to scheduled surgery.  Counseled patient to avoid live vaccines while on Cosentyx.  Recommend annual influenza, PCV 15 or PCV20 or Pneumovax 23, and Shingrix as indicated.  Reviewed the importance of regular labs while on Cosentyx.  Will monitor CBC and CMP 1 month after starting and every 3 months routinely thereafter. Will monitor TB gold annually.  Standing orders placed.  Provided patient with medication education material and answered all questions.  Patient  consented to Cosentyx.  Will upload consent into patient's chart. Reviewed storage information for Cosentyx.   Patient voiced understanding.    Reviewed importance of holding Cosentyx with signs/symptoms of an infections, if antibiotics are prescribed to treat an active infection, and with invasive procedures  Demonstrated proper injection technique with Cosentyx Unoready demo device  Patient able to demonstrate proper injection technique using the teach back method.  Patient self injected in the right lower abdomen with:  Pharmacy-Supplied Medication: Cosentyx Unoready 300mg /68mL pen NDC: [address]-40 Lot:  [id number] Expiration: 08/16/2025  Patient tolerated well.  Observed for 30 mins in office for adverse reaction. Patient denies itchiness and irritation at injection., No swelling or redness noted., and Reviewed injection site reaction management with patient verbally and printed information for review in AVS  Patient is to return in 1 month for labs and 6-8 weeks for follow-up appointment.  Standing orders for CBC/CMP placed.  TB gold will be monitored yearly.   Cosentyx SQ approved through insurance .   Rx sent to: [facility]: [phone] .  Patient provided with pharmacy phone number and advised that pharmacy will call to schedule shipment to home.  Patient will continue Cosentyx 300mg  subcut at Weeks 0 (administered in clinic today), Weeks 1, 2, 3, 4 then every 4 weeks thereafter.  All questions encouraged and answered.  Instructed patient to call with any further questions or concerns.  Chesley Mires, PharmD, MPH, BCPS, CPP Clinical Pharmacist (Rheumatology and Pulmonology)  01/12/2024 9:17 AM

## 2024-01-15 NOTE — Progress Notes (Signed)
 Patient completed first Cosentyx dose in clinic on 01/12/2024. Tolerated well, without issues. Was accomapnied by his sister at visit.  Patient will continue Cosentyx 300mg  subcut at Weeks 0 (administered in clinic), Weeks 1, 2, 3, 4 then every 4 weeks thereafter.  Chesley Mires, PharmD, MPH, BCPS, CPP Clinical Pharmacist (Rheumatology and Pulmonology)

## 2024-01-26 ENCOUNTER — Other Ambulatory Visit (HOSPITAL_COMMUNITY): Payer: Self-pay

## 2024-01-27 ENCOUNTER — Other Ambulatory Visit: Payer: Self-pay

## 2024-01-27 ENCOUNTER — Other Ambulatory Visit (HOSPITAL_COMMUNITY): Payer: Self-pay

## 2024-01-27 NOTE — Progress Notes (Signed)
 Specialty Pharmacy Refill Coordination Note  Jeffrey Black. is a 63 y.o. male contacted today regarding refills of specialty medication(s) Secukinumab (Cosentyx UnoReady)   Patient requested Delivery   Delivery date: 02/02/24   Verified address: [address] [place],  [place] [address]   Medication will be filled on 02/01/24.

## 2024-02-01 ENCOUNTER — Other Ambulatory Visit: Payer: Self-pay

## 2024-02-23 NOTE — Progress Notes (Unsigned)
 Office Visit Note  Patient: Jeffrey Black.             Date of Birth: 01/15/1961           MRN: [id number]             PCP: Karie Georges, MD Referring: Karie Georges, MD Visit Date: 02/24/2024   Subjective:  No chief complaint on file.   History of Present Illness: Jeffrey Black. is a 63 y.o. male here for follow up ***   Previous HPI 11/26/23 Jeffrey Black. is a 63 y.o. male here for evaluation and management of psoriasis and psoriatic arthritis.  He has a history of psoriasis diagnosed in 2004, presents with worsening skin and joint symptoms. They report a significant flare-up of their psoriasis in 2007, which was managed by a dermatologist. However, due to insurance limitations, they were unable to access biologic therapies such as Stelara, Humira, and Enbrel. Instead, they were treated with methotrexate and topical treatments.   In 2010, the patient began experiencing joint pain, initially in the right shoulder, which later migrated to the left shoulder. The pain was intermittent, lasting for several weeks at a time before subsiding. The patient reports that the joint pain disappeared for a period of approximately eight years, from 2016 to 2024, but has recently returned. The pain is now localized in the right knee and a finger, with no visible swelling noted.   The patient's psoriasis has also worsened over time, with the rash now covering the torso, buttocks, and thighs. Recently, the patient has noticed the rash appearing on their face, which is a new development in the past six months. The patient reports that the severity of the rash and joint pain seems to fluctuate in tandem.   The patient has been managing their symptoms with over-the-counter treatments and a topical medication, Taclonex, but reports that these treatments only provide temporary relief. The patient has not seen a dermatologist recently and is not currently on any prescribed medication  for arthritis.   The patient has a history of heavy alcohol use, which resulted in a diagnosis of fatty liver in 2014. They have been abstinent from alcohol for the past ten years. The patient has no history of joint injuries, surgeries, or gout. They have no family history of autoimmune conditions, although their mother and sister both had scalp psoriasis.   The patient lives alone and is concerned about the potential impact of their worsening symptoms on their ability to maintain independence. They are seeking treatment options to manage their psoriasis and joint pain.    No Rheumatology ROS completed.   PMFS History:  Patient Active Problem List   Diagnosis Date Noted   High risk medication use 11/26/2023   Alcohol use disorder in remission 09/24/2023   Psoriatic arthritis (HCC) 05/19/2023   Hypertension 05/19/2023   Type 2 diabetes mellitus with hyperglycemia, without long-term current use of insulin (HCC) 02/11/2023   Elevated blood pressure reading 02/11/2023   Psoriasis 07/28/2013   Elevated liver enzymes 02/22/2013   Depression 02/22/2013    Past Medical History:  Diagnosis Date   Depression    Diabetes (HCC)    ETOH abuse    History of bronchitis    Hypertension    Psoriasis     Family History  Problem Relation Age of Onset   Diabetes Mother    Diabetes type II Mother    Kidney disease Father  Hypertension Father    Hyperlipidemia Father    Heart disease Father    Stroke Father    CVA Father    CVA Maternal Grandmother    Hypertension Maternal Grandmother    Hyperlipidemia Maternal Grandmother    Heart disease Maternal Grandmother    Diabetes Maternal Grandmother    Hypertension Maternal Grandfather    Hyperlipidemia Maternal Grandfather    Heart disease Maternal Grandfather    Diabetes Maternal Grandfather    Hyperlipidemia Paternal Grandmother    Heart disease Paternal Grandmother    Hyperlipidemia Paternal Grandfather    Heart disease Paternal  Grandfather    Diabetes Paternal Grandfather    Depression Paternal Grandfather    Cancer - Colon Other    Past Surgical History:  Procedure Laterality Date   ESOPHAGOGASTRODUODENOSCOPY N/A 05/03/2013   Procedure: ESOPHAGOGASTRODUODENOSCOPY (EGD);  Surgeon: Meryl Dare, MD;  Location: [facility];  Service: Endoscopy;  Laterality: N/A;   left 4th finger surgery     Social History   Social History Narrative   Not on file   Immunization History  Administered Date(s) Administered   PFIZER(Purple Top)SARS-COV-2 Vaccination 01/13/2020, 09/11/2020   Pneumococcal Polysaccharide-23 07/24/2013   Tdap 11/08/2012     Objective: Vital Signs: There were no vitals taken for this visit.   Physical Exam   Musculoskeletal Exam: ***  CDAI Exam: CDAI Score: -- Patient Global: --; Provider Global: -- Swollen: --; Tender: -- Joint Exam 02/24/2024   No joint exam has been documented for this visit   There is currently no information documented on the homunculus. Go to the Rheumatology activity and complete the homunculus joint exam.  Investigation: No additional findings.  Imaging: No results found.  Recent Labs: Lab Results  Component Value Date   WBC 9.3 11/26/2023   HGB 14.0 11/26/2023   PLT 378 11/26/2023   NA 135 11/26/2023   K 5.5 (H) 11/26/2023   CL 100 11/26/2023   CO2 26 11/26/2023   GLUCOSE 94 11/26/2023   BUN 12 11/26/2023   CREATININE 0.79 11/26/2023   BILITOT 0.4 11/26/2023   ALKPHOS 98 02/09/2023   AST 14 11/26/2023   ALT 14 11/26/2023   PROT 7.6 11/26/2023   ALBUMIN 4.4 02/09/2023   CALCIUM 9.8 11/26/2023   GFRAA >60 06/02/2015   QFTBGOLDPLUS NEGATIVE 11/26/2023    Speciality Comments: Cosentyx started 01/12/2024  Procedures:  No procedures performed Allergies: Patient has no known allergies.   Assessment / Plan:     Visit Diagnoses: No diagnosis found.  ***  Orders: No orders of the defined types were placed in this encounter.  No  orders of the defined types were placed in this encounter.    Follow-Up Instructions: No follow-ups on file.   Fuller Plan, MD  Note - This record has been created using [employer].  Chart creation errors have been sought, but may not always  have been located. Such creation errors do not reflect on  the standard of medical care.

## 2024-02-24 ENCOUNTER — Ambulatory Visit: Payer: Medicaid Other | Attending: Internal Medicine | Admitting: Internal Medicine

## 2024-02-24 ENCOUNTER — Encounter: Payer: Self-pay | Admitting: Internal Medicine

## 2024-02-24 VITALS — BP 116/78 | HR 93 | Resp 14 | Ht 71.5 in | Wt 209.0 lb

## 2024-02-24 DIAGNOSIS — R748 Abnormal levels of other serum enzymes: Secondary | ICD-10-CM

## 2024-02-24 DIAGNOSIS — L409 Psoriasis, unspecified: Secondary | ICD-10-CM

## 2024-02-24 DIAGNOSIS — L4050 Arthropathic psoriasis, unspecified: Secondary | ICD-10-CM

## 2024-02-24 DIAGNOSIS — Z79899 Other long term (current) drug therapy: Secondary | ICD-10-CM

## 2024-02-24 MED ORDER — COSENTYX UNOREADY 300 MG/2ML ~~LOC~~ SOAJ
300.0000 mg | SUBCUTANEOUS | 2 refills | Status: DC
  Filled 2024-02-25 (×2): qty 2, 28d supply, fill #0
  Filled 2024-03-28: qty 2, 28d supply, fill #1
  Filled 2024-04-28: qty 2, 28d supply, fill #2

## 2024-02-25 ENCOUNTER — Other Ambulatory Visit: Payer: Self-pay

## 2024-02-25 LAB — CBC WITH DIFFERENTIAL/PLATELET
Absolute Lymphocytes: 1440 {cells}/uL (ref 850–3900)
Absolute Monocytes: 680 {cells}/uL (ref 200–950)
Basophils Absolute: 72 {cells}/uL (ref 0–200)
Basophils Relative: 0.9 %
Eosinophils Absolute: 208 {cells}/uL (ref 15–500)
Eosinophils Relative: 2.6 %
HCT: 44 % (ref 38.5–50.0)
Hemoglobin: 14.8 g/dL (ref 13.2–17.1)
MCH: 28.4 pg (ref 27.0–33.0)
MCHC: 33.6 g/dL (ref 32.0–36.0)
MCV: 84.3 fL (ref 80.0–100.0)
MPV: 8.3 fL (ref 7.5–12.5)
Monocytes Relative: 8.5 %
Neutro Abs: 5600 {cells}/uL (ref 1500–7800)
Neutrophils Relative %: 70 %
Platelets: 338 10*3/uL (ref 140–400)
RBC: 5.22 10*6/uL (ref 4.20–5.80)
RDW: 12.9 % (ref 11.0–15.0)
Total Lymphocyte: 18 %
WBC: 8 10*3/uL (ref 3.8–10.8)

## 2024-02-25 LAB — COMPREHENSIVE METABOLIC PANEL WITH GFR
AG Ratio: 1.6 (calc) (ref 1.0–2.5)
ALT: 15 U/L (ref 9–46)
AST: 13 U/L (ref 10–35)
Albumin: 4.6 g/dL (ref 3.6–5.1)
Alkaline phosphatase (APISO): 77 U/L (ref 35–144)
BUN: 11 mg/dL (ref 7–25)
CO2: 27 mmol/L (ref 20–32)
Calcium: 9.3 mg/dL (ref 8.6–10.3)
Chloride: 97 mmol/L — ABNORMAL LOW (ref 98–110)
Creat: 0.76 mg/dL (ref 0.70–1.35)
Globulin: 2.8 g/dL (ref 1.9–3.7)
Glucose, Bld: 92 mg/dL (ref 65–99)
Potassium: 4.8 mmol/L (ref 3.5–5.3)
Sodium: 131 mmol/L — ABNORMAL LOW (ref 135–146)
Total Bilirubin: 0.4 mg/dL (ref 0.2–1.2)
Total Protein: 7.4 g/dL (ref 6.1–8.1)
eGFR: 101 mL/min/{1.73_m2} (ref >=60)

## 2024-02-25 LAB — SEDIMENTATION RATE: Sed Rate: 6 mm/h (ref 0–20)

## 2024-02-25 NOTE — Progress Notes (Signed)
 Specialty Pharmacy Refill Coordination Note  Jeffrey Black. is a 63 y.o. male contacted today regarding refills of specialty medication(s) Secukinumab (Cosentyx [name])   Patient requested (Patient-Rptd) Delivery   Delivery date: (Patient-Rptd) 03/03/24   Verified address: (Patient-Rptd) [address] [place]  [place] [address]   Medication will be filled on 03/02/24.

## 2024-02-29 ENCOUNTER — Other Ambulatory Visit: Payer: Self-pay

## 2024-03-01 ENCOUNTER — Other Ambulatory Visit: Payer: Self-pay

## 2024-03-01 NOTE — Progress Notes (Signed)
 Patient requested updated delivery date of 4.18.25

## 2024-03-03 ENCOUNTER — Other Ambulatory Visit: Payer: Self-pay

## 2024-03-24 ENCOUNTER — Ambulatory Visit: Payer: Medicaid Other | Admitting: Family Medicine

## 2024-03-25 ENCOUNTER — Other Ambulatory Visit: Payer: Self-pay | Admitting: Family Medicine

## 2024-03-25 DIAGNOSIS — E1165 Type 2 diabetes mellitus with hyperglycemia: Secondary | ICD-10-CM

## 2024-03-28 ENCOUNTER — Other Ambulatory Visit: Payer: Self-pay

## 2024-03-28 ENCOUNTER — Other Ambulatory Visit: Payer: Self-pay | Admitting: Pharmacy Technician

## 2024-03-28 NOTE — Progress Notes (Signed)
 Specialty Pharmacy Refill Coordination Note  Jeffrey Black. is a 63 y.o. male contacted today regarding refills of specialty medication(s) Secukinumab  (Cosentyx  UnoReady)   Patient requested Delivery   Delivery date: 04/01/24   Verified address: [address] [place] [address]   Medication will be filled on 03/31/24.

## 2024-03-31 ENCOUNTER — Other Ambulatory Visit: Payer: Self-pay

## 2024-03-31 ENCOUNTER — Ambulatory Visit (INDEPENDENT_AMBULATORY_CARE_PROVIDER_SITE_OTHER): Admitting: Family Medicine

## 2024-03-31 ENCOUNTER — Encounter: Payer: Self-pay | Admitting: Family Medicine

## 2024-03-31 VITALS — BP 133/99 | HR 111 | Temp 98.1°F | Ht 71.5 in | Wt 209.4 lb

## 2024-03-31 DIAGNOSIS — I10 Essential (primary) hypertension: Secondary | ICD-10-CM

## 2024-03-31 DIAGNOSIS — Z1211 Encounter for screening for malignant neoplasm of colon: Secondary | ICD-10-CM

## 2024-03-31 DIAGNOSIS — E1165 Type 2 diabetes mellitus with hyperglycemia: Secondary | ICD-10-CM

## 2024-03-31 DIAGNOSIS — Z7984 Long term (current) use of oral hypoglycemic drugs: Secondary | ICD-10-CM | POA: Diagnosis not present

## 2024-03-31 LAB — POCT GLYCOSYLATED HEMOGLOBIN (HGB A1C): Hemoglobin A1C: 6.5 % — AB (ref 4.0–5.6)

## 2024-03-31 MED ORDER — ROSUVASTATIN CALCIUM 5 MG PO TABS: 5.0000 mg | ORAL_TABLET | Freq: Every day | ORAL | 3 refills | Status: DC

## 2024-03-31 MED ORDER — OLMESARTAN MEDOXOMIL 5 MG PO TABS: 5.0000 mg | ORAL_TABLET | Freq: Every day | ORAL | 1 refills | Status: DC

## 2024-03-31 MED ORDER — EMPAGLIFLOZIN 25 MG PO TABS: 25.0000 mg | ORAL_TABLET | Freq: Every day | ORAL | 1 refills | Status: DC

## 2024-03-31 NOTE — Progress Notes (Signed)
 Established Patient Office Visit  Subjective   Patient ID: Jeffrey Black., male    DOB: February 03, 1961  Age: 63 y.o. MRN: [id number]  Chief Complaint  Patient presents with   Medical Management of Chronic Issues    Pt is here for 6 month follow up today. He reports he went to the rheumatologist for his psoriatic arthritis and he was started on Costentyx and he is feeling much better. States that the infusions are really helping with his joint pain,  DM- A1C performed in office today and is 6.5, well controlled. He reports he is taking his medication as prescribed, no changes in vision or weight. Foot exam performed today. Pt reports he wants to see Dr. Candi Chafe for his eye exam.   HTN -- BP in office performed and is well controlled. He  reports no side effects to the medications, no chest pain, SOB, dizziness or headaches. He has a BP cuff at home and is checking BP regularly, reports they are in the normal range. Pt is still working on cutting down on smoking, initial BP's were elevated however repeat was better.        Current Outpatient Medications  Medication Instructions   Accu-Chek Softclix Lancets lancets    Blood Glucose Monitoring Suppl DEVI 1 each, Does not apply, 3 times daily, May substitute to any manufacturer covered by patient's insurance.   clobetasol  ointment (TEMOVATE ) 0.05 % 1 Application, Topical, 2 times daily   Cosentyx  UnoReady 300 mg, Subcutaneous, Every 28 days   empagliflozin  (JARDIANCE ) 25 mg, Oral, Daily before breakfast   Glucose Blood (BLOOD GLUCOSE TEST STRIPS) STRP 1 each, In Vitro, Daily, May substitute to any manufacturer covered by patient's insurance.   metFORMIN  (GLUCOPHAGE ) 1,000 mg, Oral, 2 times daily with meals   olmesartan  (BENICAR ) 5 mg, Oral, Daily   rosuvastatin  (CRESTOR ) 5 mg, Oral, Daily   triamcinolone  cream (KENALOG ) 0.1 % 1 Application, Topical, 2 times daily PRN    Patient Active Problem List   Diagnosis Date Noted   High risk  medication use 11/26/2023   Alcohol  use disorder in remission 09/24/2023   Psoriatic arthritis (HCC) 05/19/2023   Hypertension 05/19/2023   Type 2 diabetes mellitus with hyperglycemia, without long-term current use of insulin  (HCC) 02/11/2023   Elevated blood pressure reading 02/11/2023   Psoriasis 07/28/2013   Elevated liver enzymes 02/22/2013   Depression 02/22/2013      Review of Systems  All other systems reviewed and are negative.     Objective:     BP (!) 133/99 (BP Location: Left Arm, Patient Position: Sitting, Cuff Size: Large)   Pulse (!) 111   Temp 98.1 F (36.7 C) (Oral)   Ht 5' 11.5" (1.816 m)   Wt 209 lb 6.4 oz (95 kg)   SpO2 97%   BMI 28.80 kg/m    Physical Exam Vitals reviewed.  Constitutional:      Appearance: Normal appearance. He is well-groomed and normal weight.  Neck:     Thyroid : No thyromegaly.  Cardiovascular:     Rate and Rhythm: Normal rate and regular rhythm.     Heart sounds: S1 normal and S2 normal. No murmur heard. Pulmonary:     Effort: Pulmonary effort is normal.     Breath sounds: Normal breath sounds and air entry. No rales.  Musculoskeletal:     Right lower leg: No edema.     Left lower leg: No edema.  Neurological:     General: No focal  deficit present.     Mental Status: He is alert and oriented to person, place, and time.     Gait: Gait is intact.  Psychiatric:        Mood and Affect: Mood and affect normal.     Last hemoglobin A1c Lab Results  Component Value Date   HGBA1C 6.5 (A) 03/31/2024      The ASCVD Risk score (Arnett DK, et al., 2019) failed to calculate for the following reasons:   The valid total cholesterol range is 130 to 320 mg/dL    Assessment & Plan:  Type 2 diabetes mellitus with hyperglycemia, without long-term current use of insulin  [facility]) Assessment & Plan: Chronic, AC1C is 6.5 which is well controlled, will continue his medications as prescribed. He is due for his blood work to be done  today. Continue statin for primary prevention of CAD, pt was reminded that he is due for his eye exam this year. Pt state he will schedule. On jardiance  25 mg daily plus metformin  1000 mg BID, tolerating these medications well  Orders: -     POCT glycosylated hemoglobin (Hb A1C) -     Collection capillary blood specimen -     Lipid panel; Future -     Microalbumin / creatinine urine ratio; Future -     Rosuvastatin  Calcium ; Take 1 tablet (5 mg total) by mouth daily.  Dispense: 90 tablet; Refill: 3 -     Olmesartan  Medoxomil; Take 1 tablet (5 mg total) by mouth daily.  Dispense: 90 tablet; Refill: 1 -     Empagliflozin ; Take 1 tablet (25 mg total) by mouth daily before breakfast.  Dispense: 90 tablet; Refill: 1  Primary hypertension Assessment & Plan: BP is doing well today on the 5 mg olmesartan , will continue as prescribed.    Colon cancer screening -     Cologuard     Return in about 6 months (around 10/01/2024) for DM, HTN.    Aida House, MD

## 2024-04-01 LAB — LIPID PANEL
Cholesterol: 110 mg/dL (ref 0–200)
HDL: 41.1 mg/dL (ref >39.00)
LDL Cholesterol: 60 mg/dL (ref 0–99)
NonHDL: 68.48
Total CHOL/HDL Ratio: 3
Triglycerides: 44 mg/dL (ref 0.0–149.0)
VLDL: 8.8 mg/dL (ref 0.0–40.0)

## 2024-04-01 LAB — MICROALBUMIN / CREATININE URINE RATIO
Creatinine,U: 20.4 mg/dL
Microalb Creat Ratio: UNDETERMINED mg/g (ref 0.0–30.0)
Microalb, Ur: <0.7 mg/dL

## 2024-04-04 ENCOUNTER — Ambulatory Visit: Payer: Self-pay | Admitting: Family Medicine

## 2024-04-04 DIAGNOSIS — R195 Other fecal abnormalities: Secondary | ICD-10-CM

## 2024-04-06 NOTE — Assessment & Plan Note (Signed)
 BP is doing well today on the 5 mg olmesartan, will continue as prescribed.

## 2024-04-06 NOTE — Assessment & Plan Note (Addendum)
 Chronic, AC1C is 6.5 which is well controlled, will continue his medications as prescribed. He is due for his blood work to be done today. Continue statin for primary prevention of CAD, pt was reminded that he is due for his eye exam this year. Pt state he will schedule. On jardiance  25 mg daily plus metformin  1000 mg BID, tolerating these medications well

## 2024-04-22 ENCOUNTER — Other Ambulatory Visit: Payer: Self-pay | Admitting: Family Medicine

## 2024-04-22 DIAGNOSIS — E1165 Type 2 diabetes mellitus with hyperglycemia: Secondary | ICD-10-CM

## 2024-04-27 DIAGNOSIS — Z1211 Encounter for screening for malignant neoplasm of colon: Secondary | ICD-10-CM | POA: Diagnosis not present

## 2024-04-28 ENCOUNTER — Other Ambulatory Visit: Payer: Self-pay | Admitting: Pharmacy Technician

## 2024-04-28 ENCOUNTER — Other Ambulatory Visit: Payer: Self-pay

## 2024-04-28 NOTE — Progress Notes (Signed)
 Specialty Pharmacy Refill Coordination Note  Jeffrey Black. is a 63 y.o. male contacted today regarding refills of specialty medication(s) Secukinumab  (Cosentyx  UnoReady)   Patient requested Delivery   Delivery date: 05/03/24   Verified address: [address] [place]   Medication will be filled on 05/02/24.

## 2024-05-02 ENCOUNTER — Other Ambulatory Visit: Payer: Self-pay

## 2024-05-03 LAB — COLOGUARD: COLOGUARD: POSITIVE — AB

## 2024-05-17 ENCOUNTER — Encounter: Payer: Self-pay | Admitting: Internal Medicine

## 2024-05-18 NOTE — Progress Notes (Signed)
 Office Visit Note  Patient: Jeffrey Black.             Date of Birth: [birth year]           MRN: [id number]             PCP: [name], MD Referring: [name], MD Visit Date: 06/01/2024   Subjective:  Follow-up    Discussed the use of AI scribe software for clinical note transcription with the patient, who gave verbal consent to proceed.  History of Present Illness   Jeffrey Stthomas. is a 63 y.o. male here for follow up with psoriasis and psoriatic arthritis who presents for follow-up on Cosentyx  300 mg [place] monthly.  He has experienced significant improvement in his psoriasis symptoms since starting Cosentyx . The condition is now described as 'very livable', with reduced severity and no longer affecting his feet. During the initial four weeks of treatment with weekly injections, there was rapid improvement. Currently, the psoriasis is mostly cleared, with only some residual redness and no scaling.  He denies using any over-the-counter anti-inflammatory medications such as ibuprofen , Motrin , Advil , or Aleve . The psoriasis has cleared from his face, where it was previously present. He mentions a small rash that appeared but has since resolved. No rash around the injection site and no side effects or illness since starting the injections.  He does not use any topical treatments like lotions or creams for his psoriasis, although he recalls using a tube of medication for his face in the past. He is open to using triamcinolone  for any remaining mild areas if needed.  He mentions a positive result from a colon guard test screening without associated symptoms.  No soreness or restriction in shoulder movement, and no trouble sleeping due to joint issues.     Previous HPI 02/24/2024 Jeffrey Knightly. is a 63 year old male with psoriasis and psoriatic arthritis who presents for follow-up on Cosentyx  treatment after starting first dose 01/12/24.   He has  experienced significant improvement in his symptoms since starting Cosentyx . Joint pain resolved approximately ten days after each injection, and he has not experienced any joint pain since then. He describes this as the 'biggest thing' and is pleased with the outcome.   Regarding his psoriasis, most of the plaques have cleared, although some red spots remain. These plaques have been present for eighteen years, and he is surprised by the improvement. His fingernails, which used to be ridged, have also improved.   He experiences a runny nose for about four days following each Cosentyx  injection, but this is the only side effect he reports.   He mentions a past diagnosis of fatty liver. His ALT and AST levels were previously elevated due to alcohol  use, but they have normalized since he stopped drinking ten years ago. He is concerned about potential liver effects from Cosentyx .   He inquires about the Shingrix vaccine, confirming it is safe to take with Cosentyx . He expresses concern about shingles due to his father's history with the condition.      Previous HPI 11/26/23 Jeffrey LITTIE [name] is a 63 y.o. male here for evaluation and management of psoriasis and psoriatic arthritis.  He has a history of psoriasis diagnosed in 2004, presents with worsening skin and joint symptoms. They report a significant flare-up of their psoriasis in 2007, which was managed by a dermatologist. However, due to insurance limitations, they were unable to access biologic therapies such  as Stelara, Humira, and Enbrel. Instead, they were treated with methotrexate and topical treatments.   In 2010, the patient began experiencing joint pain, initially in the right shoulder, which later migrated to the left shoulder. The pain was intermittent, lasting for several weeks at a time before subsiding. The patient reports that the joint pain disappeared for a period of approximately eight years, from 2016 to 2024, but has recently  returned. The pain is now localized in the right knee and a finger, with no visible swelling noted.   The patient's psoriasis has also worsened over time, with the rash now covering the torso, buttocks, and thighs. Recently, the patient has noticed the rash appearing on their face, which is a new development in the past six months. The patient reports that the severity of the rash and joint pain seems to fluctuate in tandem.   The patient has been managing their symptoms with over-the-counter treatments and a topical medication, Taclonex, but reports that these treatments only provide temporary relief. The patient has not seen a dermatologist recently and is not currently on any prescribed medication for arthritis.   The patient has a history of heavy alcohol  use, which resulted in a diagnosis of fatty liver in 2014. They have been abstinent from alcohol  for the past ten years. The patient has no history of joint injuries, surgeries, or gout. They have no family history of autoimmune conditions, although their mother and sister both had scalp psoriasis.   The patient lives alone and is concerned about the potential impact of their worsening symptoms on their ability to maintain independence. They are seeking treatment options to manage their psoriasis and joint pain.    Review of Systems  Constitutional:  Negative for fatigue.  HENT:  Negative for mouth sores and mouth dryness.   Eyes:  Negative for dryness.  Respiratory:  Negative for shortness of breath.   Cardiovascular:  Negative for chest pain and palpitations.  Gastrointestinal:  Negative for blood in stool, constipation and diarrhea.  Endocrine: Negative for increased urination.  Genitourinary:  Negative for involuntary urination.  Musculoskeletal:  Positive for gait problem. Negative for joint pain, joint pain, joint swelling, myalgias, muscle weakness, morning stiffness, muscle tenderness and myalgias.  Skin:  Negative for color change,  rash, hair loss and sensitivity to sunlight.  Allergic/Immunologic: Negative for susceptible to infections.  Neurological:  Negative for dizziness and headaches.  Hematological:  Negative for swollen glands.  Psychiatric/Behavioral:  Negative for depressed mood and sleep disturbance. The patient is not nervous/anxious.     PMFS History:  Patient Active Problem List   Diagnosis Date Noted   High risk medication use 11/26/2023   Alcohol  use disorder in remission 09/24/2023   Psoriatic arthritis (HCC) 05/19/2023   Hypertension 05/19/2023   Type 2 diabetes mellitus with hyperglycemia, without long-term current use of insulin  (HCC) 02/11/2023   Elevated blood pressure reading 02/11/2023   Psoriasis 07/28/2013   Elevated liver enzymes 02/22/2013   Depression 02/22/2013    Past Medical History:  Diagnosis Date   Depression    Diabetes (HCC)    ETOH abuse    History of bronchitis    Hypertension    Psoriasis     Family History  Problem Relation Age of Onset   Diabetes Mother    Diabetes type II Mother    Kidney disease Father    Hypertension Father    Hyperlipidemia Father    Heart disease Father    Stroke Father  CVA Father    CVA Maternal Grandmother    Hypertension Maternal Grandmother    Hyperlipidemia Maternal Grandmother    Heart disease Maternal Grandmother    Diabetes Maternal Grandmother    Hypertension Maternal Grandfather    Hyperlipidemia Maternal Grandfather    Heart disease Maternal Grandfather    Diabetes Maternal Grandfather    Hyperlipidemia Paternal Grandmother    Heart disease Paternal Grandmother    Hyperlipidemia Paternal Grandfather    Heart disease Paternal Grandfather    Diabetes Paternal Grandfather    Depression Paternal Grandfather    Cancer - Colon Other    Past Surgical History:  Procedure Laterality Date   ESOPHAGOGASTRODUODENOSCOPY N/A 05/03/2013   Procedure: ESOPHAGOGASTRODUODENOSCOPY (EGD);  Surgeon: [name], MD;  Location:  [facility];  Service: Endoscopy;  Laterality: N/A;   left 4th finger surgery     Social History   Social History Narrative   Not on file   Immunization History  Administered Date(s) Administered   PFIZER(Purple Top)SARS-COV-2 Vaccination 01/13/2020, 09/11/2020   Pneumococcal Polysaccharide-23 07/24/2013   Tdap 11/08/2012     Objective: Vital Signs: BP 115/74 (BP Location: Left Arm, Patient Position: Sitting, Cuff Size: Normal)   Pulse (!) 101   Resp 16   Ht 6' (1.829 m)   Wt 209 lb 12.8 oz (95.2 kg)   BMI 28.45 kg/m    Physical Exam Eyes:     Conjunctiva/sclera: Conjunctivae normal.  Cardiovascular:     Rate and Rhythm: Normal rate and regular rhythm.  Pulmonary:     Effort: Pulmonary effort is normal.     Breath sounds: Normal breath sounds.  Lymphadenopathy:     Cervical: No cervical adenopathy.  Skin:    General: Skin is warm and dry.     Findings: Rash present.     Comments: Erythematous skin patches on elbow extensor surfaces, the front of knees, and on shins with minimal overlying scale  Neurological:     Mental Status: He is alert.  Psychiatric:        Mood and Affect: Mood normal.      Musculoskeletal Exam:  Shoulders full ROM no tenderness or swelling Elbows full ROM no tenderness or swelling Wrists full ROM no tenderness or swelling Fingers full ROM no tenderness or swelling No paraspinal tenderness to palpation over upper and lower back Hip normal internal and external rotation without pain, no tenderness to lateral hip palpation Knees full ROM no tenderness or swelling Ankles full ROM no tenderness or swelling   Investigation: No additional findings.  Imaging: No results found.  Recent Labs: Lab Results  Component Value Date   WBC 7.9 06/01/2024   HGB 14.5 06/01/2024   PLT 321 06/01/2024   NA 132 (L) 06/01/2024   K 5.3 06/01/2024   CL 99 06/01/2024   CO2 27 06/01/2024   GLUCOSE 96 06/01/2024   BUN 9 06/01/2024   CREATININE 0.79  06/01/2024   BILITOT 0.5 06/01/2024   ALKPHOS 98 02/09/2023   AST 12 06/01/2024   ALT 15 06/01/2024   PROT 7.3 06/01/2024   ALBUMIN 4.4 02/09/2023   CALCIUM  9.6 06/01/2024   GFRAA >60 06/02/2015   QFTBGOLDPLUS NEGATIVE 11/26/2023    Speciality Comments: Cosentyx  started 01/12/2024  Procedures:  No procedures performed Allergies: Patient has no known allergies.   Assessment / Plan:     Visit Diagnoses: Psoriatic arthritis (HCC) Psoriasis - Plan: triamcinolone  cream (KENALOG ) 0.1 % Psoriatic arthritis remains greatly improved on the Cosentyx .  His  skin disease is slightly worse than at our last time to the maintenance dosing as compared to the original loading.  Plan to continue the current treatment and resume as needed topicals on breakthrough areas.  If treatment benefit does wane further over time given his excellent response he would be a good candidate for alternate biologic DMARDs. - Continue Cosentyx  300 mg monthly. - Recommended triamcinolone  cream for mild areas on limbs still active, new Rx sent  High risk medication use - Cosentyx  300 mg [place] monthly. - Plan: CBC with Differential/Platelet, Comprehensive metabolic panel with GFR Tolerating the Cosentyx  injections well.  No serious symptoms of infection. - Rechecking CBC and CMP for medication monitoring continue Cosentyx  treatment  Orders: Orders Placed This Encounter  Procedures   CBC with Differential/Platelet   Comprehensive metabolic panel with GFR   Meds ordered this encounter  Medications   triamcinolone  cream (KENALOG ) 0.1 %    Sig: Apply 1 Application topically 2 (two) times daily as needed.    Dispense:  454 g    Refill:  0     Follow-Up Instructions: No follow-ups on file.   Lonni LELON Ester, MD  Note - This record has been created using [employer].  Chart creation errors have been sought, but may not always  have been located. Such creation errors do not reflect on  the standard of medical  care.

## 2024-05-25 ENCOUNTER — Other Ambulatory Visit: Payer: Self-pay | Admitting: Internal Medicine

## 2024-05-25 ENCOUNTER — Other Ambulatory Visit: Payer: Self-pay

## 2024-05-25 DIAGNOSIS — L409 Psoriasis, unspecified: Secondary | ICD-10-CM

## 2024-05-25 DIAGNOSIS — Z79899 Other long term (current) drug therapy: Secondary | ICD-10-CM

## 2024-05-25 DIAGNOSIS — L4050 Arthropathic psoriasis, unspecified: Secondary | ICD-10-CM

## 2024-05-25 MED ORDER — COSENTYX UNOREADY 300 MG/2ML ~~LOC~~ SOAJ
300.0000 mg | SUBCUTANEOUS | 2 refills | Status: DC
  Filled 2024-05-25 – 2024-05-27 (×2): qty 2, 28d supply, fill #0
  Filled 2024-06-23: qty 2, 28d supply, fill #1
  Filled 2024-07-19: qty 2, 28d supply, fill #2

## 2024-05-25 NOTE — Telephone Encounter (Signed)
 Last Fill: 02/29/2024  Labs: 02/24/2024 Sodium 131 Chloride 97  TB Gold: 11/26/2023 Negative   Next Visit: 06/01/2024  Last Visit: 02/24/2024  IK:Ednmpjupr arthritis (HCC)   Current Dose per office note 02/24/2024: Cosentyx  300 mg [place] monthly   Patient to update labs at upcomming appointment.   Okay to refill Cosentyx ?

## 2024-05-26 ENCOUNTER — Other Ambulatory Visit (HOSPITAL_COMMUNITY): Payer: Self-pay

## 2024-05-27 ENCOUNTER — Other Ambulatory Visit: Payer: Self-pay

## 2024-05-30 ENCOUNTER — Other Ambulatory Visit: Payer: Self-pay | Admitting: Pharmacy Technician

## 2024-05-30 ENCOUNTER — Other Ambulatory Visit: Payer: Self-pay

## 2024-05-30 NOTE — Progress Notes (Signed)
 Specialty Pharmacy Refill Coordination Note  Jeffrey Black. is a 63 y.o. male contacted today regarding refills of specialty medication(s) Secukinumab  (Cosentyx  UnoReady)   Patient requested Delivery   Delivery date: 06/01/24   Verified address: [address]  [place]   Medication will be filled on 05/31/24.

## 2024-06-01 ENCOUNTER — Ambulatory Visit: Attending: Internal Medicine | Admitting: Internal Medicine

## 2024-06-01 ENCOUNTER — Encounter: Payer: Self-pay | Admitting: Internal Medicine

## 2024-06-01 VITALS — BP 115/74 | HR 101 | Resp 16 | Ht 72.0 in | Wt 209.8 lb

## 2024-06-01 DIAGNOSIS — L4050 Arthropathic psoriasis, unspecified: Secondary | ICD-10-CM | POA: Diagnosis not present

## 2024-06-01 DIAGNOSIS — R748 Abnormal levels of other serum enzymes: Secondary | ICD-10-CM | POA: Diagnosis not present

## 2024-06-01 DIAGNOSIS — Z79899 Other long term (current) drug therapy: Secondary | ICD-10-CM | POA: Insufficient documentation

## 2024-06-01 DIAGNOSIS — L409 Psoriasis, unspecified: Secondary | ICD-10-CM | POA: Diagnosis not present

## 2024-06-01 LAB — COMPREHENSIVE METABOLIC PANEL WITH GFR
AG Ratio: 1.6 (calc) (ref 1.0–2.5)
ALT: 15 U/L (ref 9–46)
AST: 12 U/L (ref 10–35)
Albumin: 4.5 g/dL (ref 3.6–5.1)
Alkaline phosphatase (APISO): 68 U/L (ref 35–144)
BUN: 9 mg/dL (ref 7–25)
CO2: 27 mmol/L (ref 20–32)
Calcium: 9.6 mg/dL (ref 8.6–10.3)
Chloride: 99 mmol/L (ref 98–110)
Creat: 0.79 mg/dL (ref 0.70–1.35)
Globulin: 2.8 g/dL (ref 1.9–3.7)
Glucose, Bld: 96 mg/dL (ref 65–99)
Potassium: 5.3 mmol/L (ref 3.5–5.3)
Sodium: 132 mmol/L — ABNORMAL LOW (ref 135–146)
Total Bilirubin: 0.5 mg/dL (ref 0.2–1.2)
Total Protein: 7.3 g/dL (ref 6.1–8.1)
eGFR: 100 mL/min/1.73m2 (ref >=60)

## 2024-06-01 LAB — CBC WITH DIFFERENTIAL/PLATELET
Absolute Lymphocytes: 1951 {cells}/uL (ref 850–3900)
Absolute Monocytes: 830 {cells}/uL (ref 200–950)
Basophils Absolute: 79 {cells}/uL (ref 0–200)
Basophils Relative: 1 %
Eosinophils Absolute: 261 {cells}/uL (ref 15–500)
Eosinophils Relative: 3.3 %
HCT: 44.6 % (ref 38.5–50.0)
Hemoglobin: 14.5 g/dL (ref 13.2–17.1)
MCH: 28.4 pg (ref 27.0–33.0)
MCHC: 32.5 g/dL (ref 32.0–36.0)
MCV: 87.5 fL (ref 80.0–100.0)
MPV: 8.2 fL (ref 7.5–12.5)
Monocytes Relative: 10.5 %
Neutro Abs: 4780 {cells}/uL (ref 1500–7800)
Neutrophils Relative %: 60.5 %
Platelets: 321 Thousand/uL (ref 140–400)
RBC: 5.1 Million/uL (ref 4.20–5.80)
RDW: 13.3 % (ref 11.0–15.0)
Total Lymphocyte: 24.7 %
WBC: 7.9 Thousand/uL (ref 3.8–10.8)

## 2024-06-01 MED ORDER — TRIAMCINOLONE ACETONIDE 0.1 % EX CREA: 1.0000 | TOPICAL_CREAM | Freq: Two times a day (BID) | CUTANEOUS | 0 refills | Status: DC | PRN

## 2024-06-02 ENCOUNTER — Ambulatory Visit: Payer: Self-pay | Admitting: Internal Medicine

## 2024-06-02 NOTE — Progress Notes (Signed)
 Blood count and kidney and liver function test are normal.  His sodium remains slightly low at 132 this is about the same as 131 when checked 3 months ago.  This is mild and he should just make sure he is maintaining a good level of fluid intake including electrolytes.  No problem for continuing the Cosentyx .

## 2024-06-09 ENCOUNTER — Ambulatory Visit (AMBULATORY_SURGERY_CENTER): Admitting: *Deleted

## 2024-06-09 VITALS — Ht 71.5 in | Wt 209.0 lb

## 2024-06-09 DIAGNOSIS — Z1211 Encounter for screening for malignant neoplasm of colon: Secondary | ICD-10-CM

## 2024-06-09 MED ORDER — NA SULFATE-K SULFATE-MG SULF 17.5-3.13-1.6 GM/177ML PO SOLN: 1.0000 | Freq: Once | ORAL | 0 refills | Status: AC

## 2024-06-09 NOTE — Progress Notes (Signed)
 Pt's name and DOB verified at the beginning of the pre-visit wit 2 identifiers  Pt denies any difficulty with ambulating,sitting, laying down or rolling side to side  Pt has no issues moving head neck or swallowing  No egg or soy allergy known to patient   Nwoke up middle of finger surgery  Patient denies ever being intubated  No FH of Malignant Hyperthermia  Pt is not on home 02   Pt is not on blood thinners   Pt denies issues with constipation   Pt is not on dialysis  Pt denise any abnormal heart rhythms   Pt denies any upcoming cardiac testing  Patient's chart reviewed by Norleen Schillings CNRA prior to pre-visit and patient appropriate for the [facility].  Pre-visit completed and red dot placed by patient's name on their procedure day (on provider's schedule).    Visit by phone  Pt states weight is 209 lb   tructions reviewed. Pt given  both [facility] main # and MD on call # prior to instructions.  Pt states understanding of instructions. Instructed pt to review instructions again prior to procedure and call main # given if has questions.. Pt states they will.   Instructed pt on where to find instructions on My Chart.

## 2024-06-23 ENCOUNTER — Other Ambulatory Visit: Payer: Self-pay

## 2024-06-23 ENCOUNTER — Encounter: Payer: Self-pay | Admitting: Internal Medicine

## 2024-06-23 ENCOUNTER — Ambulatory Visit: Admitting: Internal Medicine

## 2024-06-23 VITALS — BP 128/88 | HR 78 | Temp 98.1°F | Resp 15 | Ht 71.5 in | Wt 209.0 lb

## 2024-06-23 DIAGNOSIS — K648 Other hemorrhoids: Secondary | ICD-10-CM | POA: Diagnosis not present

## 2024-06-23 DIAGNOSIS — D120 Benign neoplasm of cecum: Secondary | ICD-10-CM

## 2024-06-23 DIAGNOSIS — R195 Other fecal abnormalities: Secondary | ICD-10-CM

## 2024-06-23 DIAGNOSIS — K5730 Diverticulosis of large intestine without perforation or abscess without bleeding: Secondary | ICD-10-CM

## 2024-06-23 DIAGNOSIS — Z1211 Encounter for screening for malignant neoplasm of colon: Secondary | ICD-10-CM | POA: Diagnosis not present

## 2024-06-23 DIAGNOSIS — D123 Benign neoplasm of transverse colon: Secondary | ICD-10-CM | POA: Diagnosis not present

## 2024-06-23 DIAGNOSIS — F32A Depression, unspecified: Secondary | ICD-10-CM | POA: Diagnosis not present

## 2024-06-23 DIAGNOSIS — D128 Benign neoplasm of rectum: Secondary | ICD-10-CM

## 2024-06-23 DIAGNOSIS — D122 Benign neoplasm of ascending colon: Secondary | ICD-10-CM | POA: Diagnosis not present

## 2024-06-23 DIAGNOSIS — D124 Benign neoplasm of descending colon: Secondary | ICD-10-CM

## 2024-06-23 DIAGNOSIS — E119 Type 2 diabetes mellitus without complications: Secondary | ICD-10-CM | POA: Diagnosis not present

## 2024-06-23 DIAGNOSIS — I10 Essential (primary) hypertension: Secondary | ICD-10-CM | POA: Diagnosis not present

## 2024-06-23 NOTE — Progress Notes (Signed)
 GASTROENTEROLOGY PROCEDURE H&P NOTE   Primary Care Physician: [name], MD    Reason for Procedure:   Positive Cologuard  Plan:    Colonoscopy  Patient is appropriate for endoscopic procedure(s) in the ambulatory ([facility]) setting.  The nature of the procedure, as well as the risks, benefits, and alternatives were carefully and thoroughly reviewed with the patient. Ample time for discussion and questions allowed. The patient understood, was satisfied, and agreed to proceed.     HPI: Jeffrey Black. is a 63 y.o. male who presents for colonoscopy for positive Cologuard. Denies blood in stools, changes in bowel habits, or unintentional weight loss. Denies family history of colon cancer. Mother did have colon polyps but he is not sure what kind.  Past Medical History:  Diagnosis Date   Depression    Diabetes (HCC)    ETOH abuse    History of bronchitis    Hypertension    Psoriasis    Substance abuse (HCC)     Past Surgical History:  Procedure Laterality Date   ESOPHAGOGASTRODUODENOSCOPY N/A 05/03/2013   Procedure: ESOPHAGOGASTRODUODENOSCOPY (EGD);  Surgeon: [name], MD;  Location: [facility];  Service: Endoscopy;  Laterality: N/A;   left 4th finger surgery      Prior to Admission medications   Medication Sig Start Date End Date Taking? Authorizing Provider  Accu-Chek Softclix Lancets lancets  08/28/23   [provider]  Blood Glucose Monitoring Suppl DEVI 1 each by Does not apply route in the morning, at noon, and at bedtime. May substitute to any manufacturer covered by patient's insurance. 02/06/23   [name], MD  clobetasol  ointment (TEMOVATE ) 0.05 % Apply 1 Application topically 2 (two) times daily. Patient not taking: Reported on 06/09/2024 02/06/23   [name], MD  empagliflozin  (JARDIANCE ) 25 MG TABS tablet Take 1 tablet (25 mg total) by mouth daily before breakfast. 03/31/24   [name], MD  Glucose Blood  (BLOOD GLUCOSE TEST STRIPS) STRP 1 each by In Vitro route daily. May substitute to any manufacturer covered by patient's insurance. 02/06/23   [name], MD  metFORMIN  (GLUCOPHAGE ) 500 MG tablet Take 2 tablets (1,000 mg total) by mouth 2 (two) times daily with a meal. 04/22/24   [name], MD  olmesartan  (BENICAR ) 5 MG tablet Take 1 tablet (5 mg total) by mouth daily. 03/31/24   [name], MD  rosuvastatin  (CRESTOR ) 5 MG tablet Take 1 tablet (5 mg total) by mouth daily. 03/31/24   [name], MD  Secukinumab  (COSENTYX  UNOREADY) 300 MG/2ML SOAJ Inject 300 mg into the skin every 28 (twenty-eight) days. 05/25/24   Rice, Lonni ORN, MD  triamcinolone  cream (KENALOG ) 0.1 % Apply 1 Application topically 2 (two) times daily as needed. 06/01/24   Jeannetta Lonni ORN, MD    Current Outpatient Medications  Medication Sig Dispense Refill   empagliflozin  (JARDIANCE ) 25 MG TABS tablet Take 1 tablet (25 mg total) by mouth daily before breakfast. 90 tablet 1   metFORMIN  (GLUCOPHAGE ) 500 MG tablet Take 2 tablets (1,000 mg total) by mouth 2 (two) times daily with a meal. 120 tablet 5   olmesartan  (BENICAR ) 5 MG tablet Take 1 tablet (5 mg total) by mouth daily. 90 tablet 1   rosuvastatin  (CRESTOR ) 5 MG tablet Take 1 tablet (5 mg total) by mouth daily. 90 tablet 3   Accu-Chek Softclix Lancets lancets      Blood Glucose Monitoring Suppl DEVI 1 each  by Does not apply route in the morning, at noon, and at bedtime. May substitute to any manufacturer covered by patient's insurance. 1 each 0   clobetasol  ointment (TEMOVATE ) 0.05 % Apply 1 Application topically 2 (two) times daily. (Patient not taking: Reported on 06/09/2024) 30 g 5   Glucose Blood (BLOOD GLUCOSE TEST STRIPS) STRP 1 each by In Vitro route daily. May substitute to any manufacturer covered by patient's insurance. (Patient not taking: Reported on 06/23/2024) 100 strip 3   Secukinumab  (COSENTYX  UNOREADY) 300 MG/2ML SOAJ Inject 300 mg  into the skin every 28 (twenty-eight) days. 2 mL 2   triamcinolone  cream (KENALOG ) 0.1 % Apply 1 Application topically 2 (two) times daily as needed. (Patient not taking: Reported on 06/23/2024) 454 g 0   No current facility-administered medications for this visit.    Allergies as of 06/23/2024   (No Known Allergies)    Family History  Problem Relation Age of Onset   Colon polyps Mother    Diabetes Mother    Diabetes type II Mother    Kidney disease Father    Hypertension Father    Hyperlipidemia Father    Heart disease Father    Stroke Father    CVA Father    CVA Maternal Grandmother    Hypertension Maternal Grandmother    Hyperlipidemia Maternal Grandmother    Heart disease Maternal Grandmother    Diabetes Maternal Grandmother    Hypertension Maternal Grandfather    Hyperlipidemia Maternal Grandfather    Heart disease Maternal Grandfather    Diabetes Maternal Grandfather    Hyperlipidemia Paternal Grandmother    Heart disease Paternal Grandmother    Hyperlipidemia Paternal Grandfather    Heart disease Paternal Grandfather    Diabetes Paternal Grandfather    Depression Paternal Grandfather    Cancer - Colon Other    Colon cancer Neg Hx    Esophageal cancer Neg Hx    Rectal cancer Neg Hx    Stomach cancer Neg Hx     Social History   Socioeconomic History   Marital status: Divorced    Spouse name: Not on file   Number of children: Not on file   Years of education: Not on file   Highest education level: Not on file  Occupational History   Not on file  Tobacco Use   Smoking status: Every Day    Current packs/day: 1.00    Average packs/day: 1 pack/day for 34.0 years (34.0 ttl pk-yrs)    Types: Cigarettes    Passive exposure: Past   Smokeless tobacco: Never  Vaping Use   Vaping status: Never Used  Substance and Sexual Activity   Alcohol  use: Not Currently   Drug use: No   Sexual activity: Not Currently  Other Topics Concern   Not on file  Social History  Narrative   Not on file   Social Drivers of Health   Financial Resource Strain: Not on file  Food Insecurity: Not on file  Transportation Needs: Not on file  Physical Activity: Not on file  Stress: Not on file  Social Connections: Not on file  Intimate Partner Violence: Not on file    Physical Exam: Vital signs in last 24 hours: BP 117/79   Pulse 99   Temp 98.1 F (36.7 C) (Temporal)   Ht 5' 11.5 (1.816 m)   Wt 209 lb (94.8 kg)   SpO2 97%   BMI 28.74 kg/m  GEN: NAD EYE: Sclerae anicteric ENT: MMM CV: Non-tachycardic Pulm: No  increased work of breathing GI: Soft, NT/ND NEURO:  Alert & Oriented   Estefana Kidney, MD [facility]  06/23/2024 11:29 AM

## 2024-06-23 NOTE — Progress Notes (Signed)
 Report given to PACU, vss

## 2024-06-23 NOTE — Op Note (Signed)
 [facility] Patient Name: Jeffrey Black Procedure Date: 06/23/2024 11:36 AM MRN: [id number] Endoscopist: Rosario Estefana Kidney , , [id number] Age: 63 Referring MD:  Date of Birth: November 07, 1961 Gender: Male Account #: [id number] Procedure:                Colonoscopy Indications:              Positive Cologuard test Medicines:                Monitored Anesthesia Care Procedure:                Pre-Anesthesia Assessment:                           - Prior to the procedure, a History and Physical                            was performed, and patient medications and                            allergies were reviewed. The patient's tolerance of                            previous anesthesia was also reviewed. The risks                            and benefits of the procedure and the sedation                            options and risks were discussed with the patient.                            All questions were answered, and informed consent                            was obtained. Prior Anticoagulants: The patient has                            taken no anticoagulant or antiplatelet agents. ASA                            Grade Assessment: III - A patient with severe                            systemic disease. After reviewing the risks and                            benefits, the patient was deemed in satisfactory                            condition to undergo the procedure.                           After obtaining informed consent, the colonoscope  was passed under direct vision. Throughout the                            procedure, the patient's blood pressure, pulse, and                            oxygen saturations were monitored continuously. The                            Olympus CF-HQ190L ([id number]) Colonoscope was                            introduced through the anus and advanced to the the                            terminal ileum. The colonoscopy  was performed                            without difficulty. The patient tolerated the                            procedure well. The quality of the bowel                            preparation was good. The terminal ileum, ileocecal                            valve, appendiceal orifice, and rectum were                            photographed. Scope In: 11:46:19 AM Scope Out: 12:18:48 PM Scope Withdrawal Time: 0 hours 15 minutes 40 seconds  Total Procedure Duration: 0 hours 32 minutes 29 seconds  Findings:                 The terminal ileum appeared normal.                           Four sessile polyps were found in the transverse                            colon, ascending colon and cecum. The polyps were 5                            to 10 mm in size. These polyps were removed with a                            cold snare. Resection and retrieval were complete.                           Multiple diverticula were found in the sigmoid                            colon and descending colon.  A 6 mm polyp was found in the descending colon. The                            polyp was sessile. The polyp was removed with a                            cold snare. Resection and retrieval were complete.                           A 3 mm polyp was found in the rectum. The polyp was                            sessile. The polyp was removed with a cold snare.                            Resection and retrieval were complete.                           A 14 mm polyp was found in the rectum. The polyp                            was pedunculated. The polyp was removed with a hot                            snare. Resection and retrieval were complete. Area                            distal to and on the oppposite wall of the                            polypectomy site was tattooed with an injection of                            0.5 mL of Spot (carbon black).                            Non-bleeding internal hemorrhoids were found during                            retroflexion. Complications:            No immediate complications. Estimated Blood Loss:     Estimated blood loss was minimal. Impression:               - The examined portion of the ileum was normal.                           - Four 5 to 10 mm polyps in the transverse colon,                            in the ascending colon and in the cecum, removed  with a cold snare. Resected and retrieved.                           - Diverticulosis in the sigmoid colon and in the                            descending colon.                           - One 6 mm polyp in the descending colon, removed                            with a cold snare. Resected and retrieved.                           - One 3 mm polyp in the rectum, removed with a cold                            snare. Resected and retrieved.                           - One 14 mm polyp in the rectum, removed with a hot                            snare. Resected and retrieved. Tattooed.                           - Non-bleeding internal hemorrhoids. Recommendation:           - Discharge patient to home (with escort).                           - Await pathology results.                           - The findings and recommendations were discussed                            with the patient. Dr Estefana Federico Rosario Estefana Federico,  06/23/2024 12:25:34 PM

## 2024-06-23 NOTE — Patient Instructions (Signed)
 YOU HAD AN ENDOSCOPIC PROCEDURE TODAY AT THE [facility]:   Refer to the procedure report that was given to you for any specific questions about what was found during the examination.  If the procedure report does not answer your questions, please call your gastroenterologist to clarify.  If you requested that your care partner not be given the details of your procedure findings, then the procedure report has been included in a sealed envelope for you to review at your convenience later.  YOU SHOULD EXPECT: Some feelings of bloating in the abdomen. Passage of more gas than usual.  Walking can help get rid of the air that was put into your GI tract during the procedure and reduce the bloating. If you had a lower endoscopy (such as a colonoscopy or flexible sigmoidoscopy) you may notice spotting of blood in your stool or on the toilet paper. If you underwent a bowel prep for your procedure, you may not have a normal bowel movement for a few days.  Please Note:  You might notice some irritation and congestion in your nose or some drainage.  This is from the oxygen used during your procedure.  There is no need for concern and it should clear up in a day or so.  SYMPTOMS TO REPORT IMMEDIATELY:  Following lower endoscopy (colonoscopy or flexible sigmoidoscopy):  Excessive amounts of blood in the stool  Significant tenderness or worsening of abdominal pains  Swelling of the abdomen that is new, acute  Fever of 100F or higher  Resume previous diet Await pathology results Handouts on polyps, hemorrhoids and diverticulosis given  For urgent or emergent issues, a gastroenterologist can be reached at any hour by calling [phone]. Do not use MyChart messaging for urgent concerns.    DIET:  We do recommend a small meal at first, but then you may proceed to your regular diet.  Drink plenty of fluids but you should avoid alcoholic beverages for 24 hours.  ACTIVITY:  You should plan to  take it easy for the rest of today and you should NOT DRIVE or use heavy machinery until tomorrow (because of the sedation medicines used during the test).    FOLLOW UP: Our staff will call the number listed on your records the next business day following your procedure.  We will call around 7:15- 8:00 am to check on you and address any questions or concerns that you may have regarding the information given to you following your procedure. If we do not reach you, we will leave a message.     If any biopsies were taken you will be contacted by phone or by letter within the next 1-3 weeks.  Please call us  at [phone] if you have not heard about the biopsies in 3 weeks.    SIGNATURES/CONFIDENTIALITY: You and/or your care partner have signed paperwork which will be entered into your electronic medical record.  These signatures attest to the fact that that the information above on your After Visit Summary has been reviewed and is understood.  Full responsibility of the confidentiality of this discharge information lies with you and/or your care-partner.

## 2024-06-23 NOTE — Progress Notes (Signed)
 Called to room to assist during endoscopic procedure.  Patient ID and intended procedure confirmed with present staff. Received instructions for my participation in the procedure from the performing physician.

## 2024-06-24 ENCOUNTER — Telehealth: Payer: Self-pay

## 2024-06-24 NOTE — Telephone Encounter (Signed)
  Follow up Call-     06/23/2024   10:32 AM  Call back number  Post procedure Call Back phone  # [phone]  Permission to leave phone message Yes     Follow up call, LVM.

## 2024-06-27 ENCOUNTER — Other Ambulatory Visit: Payer: Self-pay

## 2024-06-27 ENCOUNTER — Other Ambulatory Visit: Payer: Self-pay | Admitting: Pharmacy Technician

## 2024-06-27 NOTE — Progress Notes (Signed)
 Specialty Pharmacy Refill Coordination Note  Jeffrey Jent. is a 63 y.o. male contacted today regarding refills of specialty medication(s) Secukinumab  (Cosentyx  UnoReady)   Patient requested Delivery   Delivery date: 06/28/24   Verified address: [address]   [place] [address]   Medication will be filled on 06/27/24. Inj due on 06/30/24.

## 2024-06-28 ENCOUNTER — Ambulatory Visit: Payer: Self-pay | Admitting: Internal Medicine

## 2024-06-28 LAB — SURGICAL PATHOLOGY

## 2024-07-06 ENCOUNTER — Other Ambulatory Visit: Payer: Self-pay

## 2024-07-14 ENCOUNTER — Other Ambulatory Visit: Payer: Self-pay

## 2024-07-15 ENCOUNTER — Other Ambulatory Visit: Payer: Self-pay

## 2024-07-19 ENCOUNTER — Other Ambulatory Visit: Payer: Self-pay

## 2024-07-19 ENCOUNTER — Other Ambulatory Visit (HOSPITAL_COMMUNITY): Payer: Self-pay

## 2024-07-19 NOTE — Progress Notes (Signed)
 Specialty Pharmacy Refill Coordination Note  Spoke with [name]  Ocie Tino. is a 63 y.o. male contacted today regarding refills of specialty medication(s) Secukinumab  (Cosentyx  UnoReady)  Doses on hand: 0  Injection date: 07/31/24   Patient requested: Delivery   Delivery date: 07/27/24   Verified address: [address] [place] [address]  Medication will be filled on 07/26/24.

## 2024-08-07 ENCOUNTER — Encounter (HOSPITAL_COMMUNITY): Payer: Self-pay | Admitting: Emergency Medicine

## 2024-08-07 ENCOUNTER — Emergency Department (HOSPITAL_COMMUNITY)

## 2024-08-07 ENCOUNTER — Other Ambulatory Visit: Payer: Self-pay

## 2024-08-07 ENCOUNTER — Inpatient Hospital Stay (HOSPITAL_COMMUNITY)
Admission: EM | Admit: 2024-08-07 | Discharge: 2024-08-14 | DRG: 853 | Disposition: A | Discharge status: Home / Self Care | Attending: Internal Medicine | Admitting: Internal Medicine

## 2024-08-07 DIAGNOSIS — B3322 Viral myocarditis: Secondary | ICD-10-CM | POA: Diagnosis not present

## 2024-08-07 DIAGNOSIS — Z7902 Long term (current) use of antithrombotics/antiplatelets: Secondary | ICD-10-CM

## 2024-08-07 DIAGNOSIS — K7200 Acute and subacute hepatic failure without coma: Secondary | ICD-10-CM | POA: Diagnosis not present

## 2024-08-07 DIAGNOSIS — K5730 Diverticulosis of large intestine without perforation or abscess without bleeding: Secondary | ICD-10-CM | POA: Diagnosis not present

## 2024-08-07 DIAGNOSIS — I672 Cerebral atherosclerosis: Secondary | ICD-10-CM | POA: Diagnosis not present

## 2024-08-07 DIAGNOSIS — R55 Syncope and collapse: Secondary | ICD-10-CM | POA: Diagnosis not present

## 2024-08-07 DIAGNOSIS — I3139 Other pericardial effusion (noninflammatory): Secondary | ICD-10-CM | POA: Diagnosis not present

## 2024-08-07 DIAGNOSIS — I4891 Unspecified atrial fibrillation: Secondary | ICD-10-CM

## 2024-08-07 DIAGNOSIS — I480 Paroxysmal atrial fibrillation: Secondary | ICD-10-CM | POA: Diagnosis present

## 2024-08-07 DIAGNOSIS — I514 Myocarditis, unspecified: Secondary | ICD-10-CM

## 2024-08-07 DIAGNOSIS — E872 Acidosis, unspecified: Secondary | ICD-10-CM | POA: Diagnosis present

## 2024-08-07 DIAGNOSIS — E871 Hypo-osmolality and hyponatremia: Secondary | ICD-10-CM | POA: Diagnosis present

## 2024-08-07 DIAGNOSIS — I214 Non-ST elevation (NSTEMI) myocardial infarction: Secondary | ICD-10-CM | POA: Diagnosis present

## 2024-08-07 DIAGNOSIS — R296 Repeated falls: Secondary | ICD-10-CM | POA: Diagnosis present

## 2024-08-07 DIAGNOSIS — Z7984 Long term (current) use of oral hypoglycemic drugs: Secondary | ICD-10-CM

## 2024-08-07 DIAGNOSIS — R0789 Other chest pain: Secondary | ICD-10-CM | POA: Diagnosis present

## 2024-08-07 DIAGNOSIS — Z7982 Long term (current) use of aspirin: Secondary | ICD-10-CM

## 2024-08-07 DIAGNOSIS — R590 Localized enlarged lymph nodes: Secondary | ICD-10-CM | POA: Diagnosis present

## 2024-08-07 DIAGNOSIS — I951 Orthostatic hypotension: Secondary | ICD-10-CM | POA: Diagnosis present

## 2024-08-07 DIAGNOSIS — R579 Shock, unspecified: Secondary | ICD-10-CM | POA: Diagnosis not present

## 2024-08-07 DIAGNOSIS — L4050 Arthropathic psoriasis, unspecified: Secondary | ICD-10-CM | POA: Diagnosis present

## 2024-08-07 DIAGNOSIS — E7800 Pure hypercholesterolemia, unspecified: Secondary | ICD-10-CM | POA: Diagnosis not present

## 2024-08-07 DIAGNOSIS — J069 Acute upper respiratory infection, unspecified: Secondary | ICD-10-CM | POA: Diagnosis present

## 2024-08-07 DIAGNOSIS — R609 Edema, unspecified: Secondary | ICD-10-CM | POA: Diagnosis not present

## 2024-08-07 DIAGNOSIS — A419 Sepsis, unspecified organism: Principal | ICD-10-CM | POA: Diagnosis present

## 2024-08-07 DIAGNOSIS — Z818 Family history of other mental and behavioral disorders: Secondary | ICD-10-CM

## 2024-08-07 DIAGNOSIS — R7989 Other specified abnormal findings of blood chemistry: Secondary | ICD-10-CM | POA: Diagnosis not present

## 2024-08-07 DIAGNOSIS — F32A Depression, unspecified: Secondary | ICD-10-CM | POA: Diagnosis present

## 2024-08-07 DIAGNOSIS — I700 Atherosclerosis of aorta: Secondary | ICD-10-CM | POA: Diagnosis not present

## 2024-08-07 DIAGNOSIS — I5031 Acute diastolic (congestive) heart failure: Secondary | ICD-10-CM | POA: Diagnosis present

## 2024-08-07 DIAGNOSIS — J9601 Acute respiratory failure with hypoxia: Secondary | ICD-10-CM | POA: Diagnosis not present

## 2024-08-07 DIAGNOSIS — Z23 Encounter for immunization: Secondary | ICD-10-CM | POA: Diagnosis not present

## 2024-08-07 DIAGNOSIS — M549 Dorsalgia, unspecified: Secondary | ICD-10-CM | POA: Diagnosis not present

## 2024-08-07 DIAGNOSIS — F17210 Nicotine dependence, cigarettes, uncomplicated: Secondary | ICD-10-CM | POA: Diagnosis present

## 2024-08-07 DIAGNOSIS — R339 Retention of urine, unspecified: Secondary | ICD-10-CM | POA: Diagnosis present

## 2024-08-07 DIAGNOSIS — I110 Hypertensive heart disease with heart failure: Secondary | ICD-10-CM | POA: Diagnosis present

## 2024-08-07 DIAGNOSIS — F1011 Alcohol abuse, in remission: Secondary | ICD-10-CM | POA: Diagnosis present

## 2024-08-07 DIAGNOSIS — R079 Chest pain, unspecified: Secondary | ICD-10-CM | POA: Diagnosis not present

## 2024-08-07 DIAGNOSIS — I2510 Atherosclerotic heart disease of native coronary artery without angina pectoris: Secondary | ICD-10-CM | POA: Diagnosis not present

## 2024-08-07 DIAGNOSIS — I517 Cardiomegaly: Secondary | ICD-10-CM | POA: Diagnosis not present

## 2024-08-07 DIAGNOSIS — I309 Acute pericarditis, unspecified: Secondary | ICD-10-CM | POA: Diagnosis not present

## 2024-08-07 DIAGNOSIS — E119 Type 2 diabetes mellitus without complications: Secondary | ICD-10-CM | POA: Diagnosis present

## 2024-08-07 DIAGNOSIS — I6522 Occlusion and stenosis of left carotid artery: Secondary | ICD-10-CM | POA: Diagnosis not present

## 2024-08-07 DIAGNOSIS — R351 Nocturia: Secondary | ICD-10-CM | POA: Diagnosis present

## 2024-08-07 DIAGNOSIS — R6521 Severe sepsis with septic shock: Secondary | ICD-10-CM | POA: Diagnosis not present

## 2024-08-07 DIAGNOSIS — K4090 Unilateral inguinal hernia, without obstruction or gangrene, not specified as recurrent: Secondary | ICD-10-CM | POA: Diagnosis not present

## 2024-08-07 DIAGNOSIS — Z955 Presence of coronary angioplasty implant and graft: Secondary | ICD-10-CM

## 2024-08-07 DIAGNOSIS — R410 Disorientation, unspecified: Secondary | ICD-10-CM | POA: Diagnosis not present

## 2024-08-07 DIAGNOSIS — R918 Other nonspecific abnormal finding of lung field: Secondary | ICD-10-CM | POA: Diagnosis not present

## 2024-08-07 DIAGNOSIS — J982 Interstitial emphysema: Secondary | ICD-10-CM | POA: Diagnosis not present

## 2024-08-07 DIAGNOSIS — Z7901 Long term (current) use of anticoagulants: Secondary | ICD-10-CM

## 2024-08-07 DIAGNOSIS — Z833 Family history of diabetes mellitus: Secondary | ICD-10-CM

## 2024-08-07 DIAGNOSIS — Z79899 Other long term (current) drug therapy: Secondary | ICD-10-CM

## 2024-08-07 DIAGNOSIS — R0602 Shortness of breath: Secondary | ICD-10-CM | POA: Diagnosis not present

## 2024-08-07 DIAGNOSIS — K760 Fatty (change of) liver, not elsewhere classified: Secondary | ICD-10-CM | POA: Diagnosis present

## 2024-08-07 DIAGNOSIS — R578 Other shock: Secondary | ICD-10-CM | POA: Diagnosis present

## 2024-08-07 DIAGNOSIS — L409 Psoriasis, unspecified: Secondary | ICD-10-CM | POA: Diagnosis present

## 2024-08-07 DIAGNOSIS — Z8249 Family history of ischemic heart disease and other diseases of the circulatory system: Secondary | ICD-10-CM

## 2024-08-07 DIAGNOSIS — I319 Disease of pericardium, unspecified: Secondary | ICD-10-CM | POA: Diagnosis not present

## 2024-08-07 DIAGNOSIS — R0902 Hypoxemia: Secondary | ICD-10-CM | POA: Diagnosis not present

## 2024-08-07 DIAGNOSIS — R591 Generalized enlarged lymph nodes: Secondary | ICD-10-CM | POA: Diagnosis not present

## 2024-08-07 DIAGNOSIS — J90 Pleural effusion, not elsewhere classified: Secondary | ICD-10-CM | POA: Diagnosis not present

## 2024-08-07 DIAGNOSIS — I10 Essential (primary) hypertension: Secondary | ICD-10-CM | POA: Diagnosis not present

## 2024-08-07 LAB — RESPIRATORY PANEL BY PCR

## 2024-08-07 LAB — CBC WITH DIFFERENTIAL/PLATELET
Abs Immature Granulocytes: 0.09 K/uL — ABNORMAL HIGH (ref 0.00–0.07)
Basophils Absolute: 0.1 K/uL (ref 0.0–0.1)
Basophils Relative: 0 %
Eosinophils Absolute: 0.2 K/uL (ref 0.0–0.5)
Eosinophils Relative: 1 %
HCT: 34.9 % — ABNORMAL LOW (ref 39.0–52.0)
Hemoglobin: 11.6 g/dL — ABNORMAL LOW (ref 13.0–17.0)
Immature Granulocytes: 1 %
Lymphocytes Relative: 7 %
Lymphs Abs: 1.2 K/uL (ref 0.7–4.0)
MCH: 28.9 pg (ref 26.0–34.0)
MCHC: 33.2 g/dL (ref 30.0–36.0)
MCV: 86.8 fL (ref 80.0–100.0)
Monocytes Absolute: 1.2 K/uL — ABNORMAL HIGH (ref 0.1–1.0)
Monocytes Relative: 7 %
Neutro Abs: 15 K/uL — ABNORMAL HIGH (ref 1.7–7.7)
Neutrophils Relative %: 84 %
Platelets: 196 K/uL (ref 150–400)
RBC: 4.02 MIL/uL — ABNORMAL LOW (ref 4.22–5.81)
RDW: 13.6 % (ref 11.5–15.5)
WBC: 17.7 K/uL — ABNORMAL HIGH (ref 4.0–10.5)
nRBC: 0 % (ref 0.0–0.2)

## 2024-08-07 LAB — MRSA NEXT GEN BY PCR, NASAL: MRSA by PCR Next Gen: NOT DETECTED

## 2024-08-07 LAB — COMPREHENSIVE METABOLIC PANEL WITH GFR
ALT: 40 U/L (ref 0–44)
AST: 27 U/L (ref 15–41)
Albumin: 2.9 g/dL — ABNORMAL LOW (ref 3.5–5.0)
Alkaline Phosphatase: 40 U/L (ref 38–126)
Anion gap: 12 (ref 5–15)
BUN: 14 mg/dL (ref 8–23)
CO2: 19 mmol/L — ABNORMAL LOW (ref 22–32)
Calcium: 8.7 mg/dL — ABNORMAL LOW (ref 8.9–10.3)
Chloride: 99 mmol/L (ref 98–111)
Creatinine, Ser: 1.21 mg/dL (ref 0.61–1.24)
GFR, Estimated: >60 mL/min (ref >60)
Glucose, Bld: 163 mg/dL — ABNORMAL HIGH (ref 70–99)
Potassium: 4.3 mmol/L (ref 3.5–5.1)
Sodium: 130 mmol/L — ABNORMAL LOW (ref 135–145)
Total Bilirubin: 0.5 mg/dL (ref 0.0–1.2)
Total Protein: 5.7 g/dL — ABNORMAL LOW (ref 6.5–8.1)

## 2024-08-07 LAB — URINALYSIS, W/ REFLEX TO CULTURE (INFECTION SUSPECTED)
Bacteria, UA: NONE SEEN
Bilirubin Urine: NEGATIVE
Glucose, UA: >=500 mg/dL — AB
Ketones, ur: 5 mg/dL — AB
Leukocytes,Ua: NEGATIVE
Nitrite: NEGATIVE
Protein, ur: NEGATIVE mg/dL
Specific Gravity, Urine: >1.046 — ABNORMAL HIGH (ref 1.005–1.030)
pH: 5 (ref 5.0–8.0)

## 2024-08-07 LAB — I-STAT CHEM 8, ED
BUN: 15 mg/dL (ref 8–23)
Calcium, Ion: 1.2 mmol/L (ref 1.15–1.40)
Chloride: 97 mmol/L — ABNORMAL LOW (ref 98–111)
Creatinine, Ser: 1.1 mg/dL (ref 0.61–1.24)
Glucose, Bld: 165 mg/dL — ABNORMAL HIGH (ref 70–99)
HCT: 34 % — ABNORMAL LOW (ref 39.0–52.0)
Hemoglobin: 11.6 g/dL — ABNORMAL LOW (ref 13.0–17.0)
Potassium: 4.3 mmol/L (ref 3.5–5.1)
Sodium: 129 mmol/L — ABNORMAL LOW (ref 135–145)
TCO2: 20 mmol/L — ABNORMAL LOW (ref 22–32)

## 2024-08-07 LAB — PROCALCITONIN: Procalcitonin: 2.03 ng/mL

## 2024-08-07 LAB — I-STAT VENOUS BLOOD GAS, ED
Acid-base deficit: 5 mmol/L — ABNORMAL HIGH (ref 0.0–2.0)
Bicarbonate: 20.8 mmol/L (ref 20.0–28.0)
Calcium, Ion: 1.2 mmol/L (ref 1.15–1.40)
HCT: 34 % — ABNORMAL LOW (ref 39.0–52.0)
Hemoglobin: 11.6 g/dL — ABNORMAL LOW (ref 13.0–17.0)
O2 Saturation: 74 %
Potassium: 4.3 mmol/L (ref 3.5–5.1)
Sodium: 129 mmol/L — ABNORMAL LOW (ref 135–145)
TCO2: 22 mmol/L (ref 22–32)
pCO2, Ven: 39.3 mmHg — ABNORMAL LOW (ref 44–60)
pH, Ven: 7.332 (ref 7.25–7.43)
pO2, Ven: 42 mmHg (ref 32–45)

## 2024-08-07 LAB — GLUCOSE, CAPILLARY
Glucose-Capillary: 131 mg/dL — ABNORMAL HIGH (ref 70–99)
Glucose-Capillary: 144 mg/dL — ABNORMAL HIGH (ref 70–99)
Glucose-Capillary: 110 mg/dL — ABNORMAL HIGH (ref 70–99)

## 2024-08-07 LAB — I-STAT CG4 LACTIC ACID, ED: Lactic Acid, Venous: 2.4 mmol/L (ref 0.5–1.9)

## 2024-08-07 LAB — PROTIME-INR
INR: 1.4 — ABNORMAL HIGH (ref 0.8–1.2)
Prothrombin Time: 17.5 s — ABNORMAL HIGH (ref 11.4–15.2)

## 2024-08-07 LAB — LIPASE, BLOOD: Lipase: 11 U/L (ref 11–51)

## 2024-08-07 LAB — BRAIN NATRIURETIC PEPTIDE: B Natriuretic Peptide: 190.5 pg/mL — ABNORMAL HIGH (ref 0.0–100.0)

## 2024-08-07 LAB — TYPE AND SCREEN
ABO/RH(D): A POS
Antibody Screen: NEGATIVE

## 2024-08-07 LAB — SARS CORONAVIRUS 2 BY RT PCR: SARS Coronavirus 2 by RT PCR: NEGATIVE

## 2024-08-07 LAB — ABO/RH: ABO/RH(D): A POS

## 2024-08-07 LAB — TROPONIN I (HIGH SENSITIVITY)
Troponin I (High Sensitivity): 1350 ng/L (ref <18)
Troponin I (High Sensitivity): 1336 ng/L (ref <18)

## 2024-08-07 LAB — CBG MONITORING, ED: Glucose-Capillary: 160 mg/dL — ABNORMAL HIGH (ref 70–99)

## 2024-08-07 LAB — SEDIMENTATION RATE: Sed Rate: 17 mm/h — ABNORMAL HIGH (ref 0–16)

## 2024-08-07 MED ORDER — PIPERACILLIN-TAZOBACTAM 3.375 G IVPB 30 MIN
3.3750 g | Freq: Once | INTRAVENOUS | Status: AC
  Administered 2024-08-07: 3.375 g via INTRAVENOUS
  Filled 2024-08-07: qty 50

## 2024-08-07 MED ORDER — SODIUM CHLORIDE 0.9 % IV BOLUS
1000.0000 mL | Freq: Once | INTRAVENOUS | Status: AC
Start: 2024-08-07 — End: 2024-08-07
  Administered 2024-08-07: 1000 mL via INTRAVENOUS

## 2024-08-07 MED ORDER — PANTOPRAZOLE SODIUM 40 MG IV SOLR
40.0000 mg | Freq: Two times a day (BID) | INTRAVENOUS | Status: DC
  Administered 2024-08-07: 40 mg via INTRAVENOUS
  Filled 2024-08-07: qty 10

## 2024-08-07 MED ORDER — CHLORHEXIDINE GLUCONATE CLOTH 2 % EX PADS
6.0000 | MEDICATED_PAD | Freq: Every day | CUTANEOUS | Status: DC
  Administered 2024-08-07 – 2024-08-14 (×8): 6 via TOPICAL

## 2024-08-07 MED ORDER — ALBUMIN HUMAN 25 % IV SOLN
25.0000 g | Freq: Four times a day (QID) | INTRAVENOUS | Status: AC
  Administered 2024-08-07: 25 g via INTRAVENOUS
  Administered 2024-08-07: 12.5 g via INTRAVENOUS
  Administered 2024-08-08 (×2): 25 g via INTRAVENOUS
  Filled 2024-08-07 (×4): qty 100

## 2024-08-07 MED ORDER — DOCUSATE SODIUM 100 MG PO CAPS: 100.0000 mg | ORAL_CAPSULE | Freq: Two times a day (BID) | ORAL | Status: DC | PRN

## 2024-08-07 MED ORDER — HEPARIN BOLUS VIA INFUSION
4000.0000 [IU] | Freq: Once | INTRAVENOUS | Status: AC
  Administered 2024-08-07: 4000 [IU] via INTRAVENOUS
  Filled 2024-08-07: qty 4000

## 2024-08-07 MED ORDER — ACETAMINOPHEN 10 MG/ML IV SOLN
1000.0000 mg | Freq: Four times a day (QID) | INTRAVENOUS | Status: AC
  Administered 2024-08-07 – 2024-08-08 (×4): 1000 mg via INTRAVENOUS
  Filled 2024-08-07 (×4): qty 100

## 2024-08-07 MED ORDER — PIPERACILLIN-TAZOBACTAM 3.375 G IVPB
3.3750 g | Freq: Three times a day (TID) | INTRAVENOUS | Status: DC
Start: 2024-08-07 — End: 2024-08-11
  Administered 2024-08-07 – 2024-08-11 (×11): 3.375 g via INTRAVENOUS
  Filled 2024-08-07 (×13): qty 50

## 2024-08-07 MED ORDER — HYDROMORPHONE HCL 1 MG/ML IJ SOLN: 0.5000 mg | INTRAMUSCULAR | Status: DC | PRN

## 2024-08-07 MED ORDER — INSULIN ASPART 100 UNIT/ML IJ SOLN
0.0000 [IU] | INTRAMUSCULAR | Status: DC
  Administered 2024-08-07 (×2): 1 [IU] via SUBCUTANEOUS
  Administered 2024-08-08: 2 [IU] via SUBCUTANEOUS
  Administered 2024-08-08 – 2024-08-09 (×4): 1 [IU] via SUBCUTANEOUS
  Administered 2024-08-09: 2 [IU] via SUBCUTANEOUS
  Administered 2024-08-10 – 2024-08-11 (×3): 1 [IU] via SUBCUTANEOUS
  Administered 2024-08-11 (×2): 2 [IU] via SUBCUTANEOUS
  Administered 2024-08-11 (×2): 1 [IU] via SUBCUTANEOUS
  Administered 2024-08-11: 3 [IU] via SUBCUTANEOUS
  Administered 2024-08-12 (×3): 2 [IU] via SUBCUTANEOUS
  Administered 2024-08-13: 3 [IU] via SUBCUTANEOUS
  Administered 2024-08-13 (×2): 1 [IU] via SUBCUTANEOUS

## 2024-08-07 MED ORDER — POLYETHYLENE GLYCOL 3350 17 G PO PACK: 17.0000 g | PACK | Freq: Every day | ORAL | Status: DC | PRN

## 2024-08-07 MED ORDER — LACTATED RINGERS IV SOLN: INTRAVENOUS | Status: DC

## 2024-08-07 MED ORDER — ASPIRIN 325 MG PO TABS
325.0000 mg | ORAL_TABLET | Freq: Once | ORAL | Status: AC
  Administered 2024-08-07: 325 mg via ORAL
  Filled 2024-08-07: qty 1

## 2024-08-07 MED ORDER — VANCOMYCIN HCL 1750 MG/350ML IV SOLN
1750.0000 mg | Freq: Once | INTRAVENOUS | Status: AC
  Administered 2024-08-07: 1750 mg via INTRAVENOUS
  Filled 2024-08-07: qty 350

## 2024-08-07 MED ORDER — IOHEXOL 350 MG/ML SOLN
100.0000 mL | Freq: Once | INTRAVENOUS | Status: AC | PRN
  Administered 2024-08-07: 100 mL via INTRAVENOUS

## 2024-08-07 MED ORDER — HEPARIN (PORCINE) 25000 UT/250ML-% IV SOLN
2600.0000 [IU]/h | INTRAVENOUS | Status: DC
  Administered 2024-08-07: 1150 [IU]/h via INTRAVENOUS
  Administered 2024-08-08: 2150 [IU]/h via INTRAVENOUS
  Administered 2024-08-08: 1450 [IU]/h via INTRAVENOUS
  Administered 2024-08-09: 2500 [IU]/h via INTRAVENOUS
  Administered 2024-08-09: 2400 [IU]/h via INTRAVENOUS
  Administered 2024-08-10 (×2): 2600 [IU]/h via INTRAVENOUS
  Filled 2024-08-07 (×7): qty 250

## 2024-08-07 MED ORDER — NOREPINEPHRINE 4 MG/250ML-% IV SOLN
0.0000 ug/min | INTRAVENOUS | Status: DC
  Administered 2024-08-08: 4 ug/min via INTRAVENOUS
  Filled 2024-08-07: qty 250

## 2024-08-07 MED ORDER — LACTATED RINGERS IV BOLUS
1000.0000 mL | Freq: Once | INTRAVENOUS | Status: AC
  Administered 2024-08-07: 1000 mL via INTRAVENOUS

## 2024-08-07 MED ORDER — NOREPINEPHRINE 4 MG/250ML-% IV SOLN
INTRAVENOUS | Status: AC
  Administered 2024-08-07: 20 ug/min via INTRAVENOUS
  Filled 2024-08-07: qty 250

## 2024-08-07 MED ORDER — VANCOMYCIN HCL 750 MG/150ML IV SOLN
750.0000 mg | Freq: Three times a day (TID) | INTRAVENOUS | Status: DC
  Administered 2024-08-08 (×2): 750 mg via INTRAVENOUS
  Filled 2024-08-07 (×2): qty 150

## 2024-08-07 NOTE — Progress Notes (Signed)
 Pharmacy Antibiotic Note  Jeffrey Black. is a 63 y.o. male admitted on 08/07/2024 concern for sepsis.  Pharmacy has been consulted for vancomycin  dosing.  Plan: Vancomycin  1750 mg IV x 1, then 750 mg IV q 8h (eAUC 504) Monitor renal function, Cx and clinical progression to narrow Vancomycin  levels as indicated     Temp (24hrs), Avg:98.3 F (36.8 C), Min:98.3 F (36.8 C), Max:98.3 F (36.8 C)  Recent Labs  Lab 08/07/24 1529 08/07/24 1533  WBC 17.7*  --   CREATININE  --  1.10  LATICACIDVEN  --  2.4*    CrCl cannot be calculated (Unknown ideal weight.).    No Known Allergies  Dorn Poot, PharmD, [facility] Clinical Pharmacist ED Pharmacist Phone # [phone] 08/07/2024 4:39 PM

## 2024-08-07 NOTE — H&P (Signed)
 NAME:  Jeffrey Younglove., MRN:  [id number], DOB:  Apr 09, 1961, LOS: 0 ADMISSION DATE:  08/07/2024, CONSULTATION DATE:  08/07/24 REFERRING MD:  EDP, CHIEF COMPLAINT:  CP   History of Present Illness:  63 year old man w/ hx of psoriasis, DM, prior EtOH abuse in remission p/w 2 days of chest pain.  Located sternal, worse with inspiration.  No clear inciting event except headaches.  + fevers/chills.  +syncope, recurrent.  Pain is sharp deep and radiates to back.  +orthostasis.  BP extremely low on admit, Received 1L LR started on pressors, and taken for CTA H/C/A/P showing pneumomediastinum, no dissection, no PE.  EKG no STEMI.  PCCM to admit. No prior cardiopulmonary hx. No trauma No sick contacts No N/V/D  Pertinent  Medical History   Past Medical History:  Diagnosis Date   Depression    Diabetes (HCC)    ETOH abuse    History of bronchitis    Hypertension    Psoriasis    Substance abuse (HCC)    Last A1c 6.5%  Significant Hospital Events: Including procedures, antibiotic start and stop dates in addition to other pertinent events   9/21 admit  Interim History / Subjective:  consult  Objective   BP 90/70 on 81mcg/min levo  Examination: General: ill appearing HENT: MM dry, trachea midline Lungs: clear, nonlabored Cardiovascular: regular, normal rate, no murmur, ext warm and pulse equal x 4, US  with takutsubo type pattern with apical akinesis and also a small pericardial effusion, RV looks fine Abdomen: soft, +BS Extremities: no edema Neuro: moves to command Skin: pale Psych: Aox3, anxious  Labs/imaging reviewed  Resolved problem list   Assessment and Plan  Shock- ext warm, echo reviewed, seems more septic.  Lactate up slightly.  Some infectious symptoms preceding.  +WBC with left shift.  HA- CT CTA head okay, nonfocal neuro Pneumomediastinum, mediastinal adenopathy- with pain description could fit esophageal injury/rupture but imaging not that consistent nor is  clinical hx. Pleurisy, ?pericardial pain- EKG less specific for pericarditis, pain is central Hyponatremia- suspect acute phase reactant   - Additional LR - Broad spectrum abx, blood/urine cx, RVP, COVID, sed rate - Formal echo - Levo for MAP 65 - Low threshold for CVL, steroids - NPO, ssi - full code  Labs   CBC: Recent Labs  Lab 08/07/24 1533  HGB 11.6*  11.6*  HCT 34.0*  34.0*    Basic Metabolic Panel: Recent Labs  Lab 08/07/24 1533  NA 129*  129*  K 4.3  4.3  CL 97*  GLUCOSE 165*  BUN 15  CREATININE 1.10   GFR: CrCl cannot be calculated (Unknown ideal weight.). Recent Labs  Lab 08/07/24 1533  LATICACIDVEN 2.4*    Liver Function Tests: No results for input(s): AST, ALT, ALKPHOS, BILITOT, PROT, ALBUMIN  in the last 168 hours. No results for input(s): LIPASE, AMYLASE in the last 168 hours. No results for input(s): AMMONIA in the last 168 hours.  ABG    Component Value Date/Time   HCO3 20.8 08/07/2024 1533   TCO2 20 (L) 08/07/2024 1533   TCO2 22 08/07/2024 1533   ACIDBASEDEF 5.0 (H) 08/07/2024 1533   O2SAT 74 08/07/2024 1533     Coagulation Profile: No results for input(s): INR, PROTIME in the last 168 hours.  Cardiac Enzymes: No results for input(s): CKTOTAL, CKMB, CKMBINDEX, TROPONINI in the last 168 hours.  HbA1C: Hemoglobin A1C  Date/Time Value Ref Range Status  03/31/2024 03:18 PM 6.5 (A) 4.0 - 5.6 %  Final  09/24/2023 03:08 PM 7.1 (A) 4.0 - 5.6 % Final   Hgb A1c MFr Bld  Date/Time Value Ref Range Status  02/24/2013 06:40 AM 6.1 (H) <5.7 % Final    Comment:    (NOTE)                                                                       According to the ADA Clinical Practice Recommendations for 2011, when HbA1c is used as a screening test:  >=6.5%   Diagnostic of Diabetes Mellitus           (if abnormal result is confirmed) 5.7-6.4%   Increased risk of developing Diabetes Mellitus References:Diagnosis  and Classification of Diabetes Mellitus,Diabetes Care,2011,34(Suppl 1):S62-S69 and Standards of Medical Care in         Diabetes - 2011,Diabetes Care,2011,34 (Suppl 1):S11-S61.    CBG: Recent Labs  Lab 08/07/24 1520  GLUCAP 160*    Review of Systems:    Positive Symptoms in bold:  Constitutional fevers, chills, weight loss, fatigue, anorexia, malaise  Eyes decreased vision, double vision, eye irritation  Ears, Nose, Mouth, Throat sore throat, trouble swallowing, sinus congestion  Cardiovascular chest pain, paroxysmal nocturnal dyspnea, lower ext edema, palpitations   Respiratory SOB, cough, DOE, hemoptysis, wheezing  Gastrointestinal nausea, vomiting, diarrhea  Genitourinary burning with urination, trouble urinating  Musculoskeletal joint aches, joint swelling, back pain  Integumentary  rashes, skin lesions  Neurological focal weakness, focal numbness, trouble speaking, headaches  Psychiatric depression, anxiety, confusion  Endocrine polyuria, polydipsia, cold intolerance, heat intolerance  Hematologic abnormal bruising, abnormal bleeding, unexplained nose bleeds  Allergic/Immunologic recurrent infections, hives, swollen lymph nodes     Past Medical History:  He,  has a past medical history of Depression, Diabetes (HCC), ETOH abuse, History of bronchitis, Hypertension, Psoriasis, and Substance abuse (HCC).   Surgical History:   Past Surgical History:  Procedure Laterality Date   ESOPHAGOGASTRODUODENOSCOPY N/A 05/03/2013   Procedure: ESOPHAGOGASTRODUODENOSCOPY (EGD);  Surgeon: [name], MD;  Location: [facility];  Service: Endoscopy;  Laterality: N/A;   left 4th finger surgery       Social History:   reports that he has been smoking cigarettes. He has a 34 pack-year smoking history. He has been exposed to tobacco smoke. He has never used smokeless tobacco. He reports that he does not currently use alcohol . He reports that he does not use drugs.   Family  History:  His family history includes CVA in his father and maternal grandmother; Cancer - Colon in an other family member; Colon polyps in his mother; Depression in his paternal grandfather; Diabetes in his maternal grandfather, maternal grandmother, mother, and paternal grandfather; Diabetes type II in his mother; Heart disease in his father, maternal grandfather, maternal grandmother, paternal grandfather, and paternal grandmother; Hyperlipidemia in his father, maternal grandfather, maternal grandmother, paternal grandfather, and paternal grandmother; Hypertension in his father, maternal grandfather, and maternal grandmother; Kidney disease in his father; Stroke in his father. There is no history of Colon cancer, Esophageal cancer, Rectal cancer, or Stomach cancer.   Allergies No Known Allergies   Home Medications  Prior to Admission medications   Medication Sig Start Date End Date Taking? Authorizing Provider  Accu-Chek Softclix Lancets lancets  08/28/23  [provider]  Blood Glucose Monitoring Suppl DEVI 1 each by Does not apply route in the morning, at noon, and at bedtime. May substitute to any manufacturer covered by patient's insurance. 02/06/23   [name], MD  clobetasol  ointment (TEMOVATE ) 0.05 % Apply 1 Application topically 2 (two) times daily. Patient not taking: Reported on 06/09/2024 02/06/23   [name], MD  empagliflozin  (JARDIANCE ) 25 MG TABS tablet Take 1 tablet (25 mg total) by mouth daily before breakfast. 03/31/24   [name], MD  Glucose Blood (BLOOD GLUCOSE TEST STRIPS) STRP 1 each by In Vitro route daily. May substitute to any manufacturer covered by patient's insurance. Patient not taking: Reported on 06/23/2024 02/06/23   [name], MD  metFORMIN  (GLUCOPHAGE ) 500 MG tablet Take 2 tablets (1,000 mg total) by mouth 2 (two) times daily with a meal. 04/22/24   [name], MD  olmesartan  (BENICAR ) 5 MG tablet Take 1 tablet (5 mg  total) by mouth daily. 03/31/24   [name], MD  rosuvastatin  (CRESTOR ) 5 MG tablet Take 1 tablet (5 mg total) by mouth daily. 03/31/24   [name], MD  Secukinumab  (COSENTYX  UNOREADY) 300 MG/2ML SOAJ Inject 300 mg into the skin every 28 (twenty-eight) days. 05/25/24   Rice, Lonni ORN, MD  triamcinolone  cream (KENALOG ) 0.1 % Apply 1 Application topically 2 (two) times daily as needed. Patient not taking: Reported on 06/23/2024 06/01/24   Jeannetta Lonni ORN, MD     Critical care time: 34 mins

## 2024-08-07 NOTE — Progress Notes (Signed)
 PHARMACY - ANTICOAGULATION CONSULT NOTE  Pharmacy Consult for heparin  Indication: chest pain/ACS  No Known Allergies  Patient Measurements:    Vital Signs: Temp: 98.3 F (36.8 C) (09/21 1602) Temp Source: Oral (09/21 1602) BP: 101/76 (09/21 1730) Pulse Rate: 106 (09/21 1745)  Labs: Recent Labs    08/07/24 1529 08/07/24 1533  HGB 11.6* 11.6*  11.6*  HCT 34.9* 34.0*  34.0*  PLT 196  --   LABPROT 17.5*  --   INR 1.4*  --   CREATININE 1.21 1.10  TROPONINIHS 1,350*  --     CrCl cannot be calculated (Unknown ideal weight.).   Medical History: Past Medical History:  Diagnosis Date   Depression    Diabetes (HCC)    ETOH abuse    History of bronchitis    Hypertension    Psoriasis    Substance abuse (HCC)      Assessment: 36 YOM presenting with shock, pericardial pain, now concern for ACS, he is not on anticoagulation PTA  Goal of Therapy:  Heparin  level 0.3-0.7 units/ml Monitor platelets by anticoagulation protocol: Yes   Plan:  Heparin  4000 units Iv x 1, and gtt at 1150 units/hr F/u 6 hour heparin  level F/u cards eval and recs  Dorn Poot, PharmD, [facility] Clinical Pharmacist ED Pharmacist Phone # [phone] 08/07/2024 6:11 PM

## 2024-08-07 NOTE — ED Notes (Signed)
Critical care provider at bedside.  

## 2024-08-07 NOTE — Progress Notes (Signed)
 Secure chat sent to RN to notify PICC will not be placed today.

## 2024-08-07 NOTE — ED Triage Notes (Signed)
 Pt here from home with c/o chest pain that started 2 days ago and has been having syncopal episodes since that time , b/p in 80's sysytolic

## 2024-08-07 NOTE — Progress Notes (Signed)
 eLink Physician-Brief Progress Note Patient Name: Jeffrey Black. DOB: 05/20/1961 MRN: [id number]   Date of Service  08/07/2024  HPI/Events of Note  Bedside RN reports that patient is asking for us  to order a PSA while he is inpatient. He has apparently been having trouble with dribbling urine. RN reports he hasn't urinated since admit to unit today ( about 4 hours ago).   eICU Interventions  Reasonable to order PSA as per patient request. To be included in am labs. Bedside rounding team to follow up on this. Discussed with bedside RN to do a bladder scan as may need straight cath/indwelling Foley  Addendum: Bladder scan with 780 cc Given history of difficulty in urinating Foley cath insertion ordered Bedside rounding team to reassess when Foley catheter can be appropriately removed     Intervention Category Intermediate Interventions: Oliguria - evaluation and management  Jeffrey Black 08/07/2024, 9:49 PM

## 2024-08-07 NOTE — ED Notes (Signed)
 CCMD called, pt on monitor

## 2024-08-07 NOTE — Consult Note (Addendum)
 Cardiology Consultation   Patient ID: Jeffrey Black. MRN: [id number]; DOB: 1960-11-20  Admit date: 08/07/2024 Date of Consult: 08/07/2024  PCP:  Jeffrey Heron HERO, MD   [facility] Providers Cardiologist:  None        Patient Profile: Jeffrey Black. is a 63 y.o. male with a hx of psoriasis, hepatic steatosis, tobacco use, and EtOH use (quit 2016) who is being seen 08/07/2024 for the evaluation of chest pain and elevated troponin.  History of Present Illness: Mr. Jeffrey Black reports that on Wednesday (9/17), he started to have fever, chills, night sweats, and headache. He reports that these symptoms continued and 2 days prior to his presentation (Friday), he started to have dull, non-exertional chest pain with radiation the back. He reports that the pain gets better with laying on his back and gets worse if he sits up or leans forward. He also reports that pain gets worse with deep breaths. No cough, SOB, PND, orthopnea, or leg swelling. He endorses nausea but no vomiting.   Upon his presentation to the ED, he was found to be hypotensive requiring IVF and initiation of levophed . He's been on 8 mcg/min of levophed . He had CTA C/A/P, which showed small amount of pneumomediastinum, GGO in the LLL, and no pericardial effusion.  Labs showed WBC 17.7, Cr 1.21, troponin 1350, BNP 190, and lactate 2.4. EKG showed sinus tachycardia with nonspecific ST changes.   Past Medical History:  Diagnosis Date   Depression    Diabetes (HCC)    ETOH abuse    History of bronchitis    Hypertension    Psoriasis    Substance abuse (HCC)     Past Surgical History:  Procedure Laterality Date   ESOPHAGOGASTRODUODENOSCOPY N/A 05/03/2013   Procedure: ESOPHAGOGASTRODUODENOSCOPY (EGD);  Surgeon: Jeffrey ONEIDA Buddy, MD;  Location: [facility];  Service: Endoscopy;  Laterality: N/A;   left 4th finger surgery       Home Medications:  Prior to Admission medications   Medication Sig Start Date  End Date Taking? Authorizing Provider  Accu-Chek Softclix Lancets lancets  08/28/23   [provider]  Blood Glucose Monitoring Suppl DEVI 1 each by Does not apply route in the morning, at noon, and at bedtime. May substitute to any manufacturer covered by patient's insurance. 02/06/23   Jeffrey Heron HERO, MD  clobetasol  ointment (TEMOVATE ) 0.05 % Apply 1 Application topically 2 (two) times daily. Patient not taking: Reported on 06/09/2024 02/06/23   Jeffrey Heron HERO, MD  empagliflozin  (JARDIANCE ) 25 MG TABS tablet Take 1 tablet (25 mg total) by mouth daily before breakfast. 03/31/24   Jeffrey Heron HERO, MD  Glucose Blood (BLOOD GLUCOSE TEST STRIPS) STRP 1 each by In Vitro route daily. May substitute to any manufacturer covered by patient's insurance. Patient not taking: Reported on 06/23/2024 02/06/23   Jeffrey Heron HERO, MD  metFORMIN  (GLUCOPHAGE ) 500 MG tablet Take 2 tablets (1,000 mg total) by mouth 2 (two) times daily with a meal. 04/22/24   Jeffrey Heron HERO, MD  olmesartan  (BENICAR ) 5 MG tablet Take 1 tablet (5 mg total) by mouth daily. 03/31/24   Jeffrey Heron HERO, MD  rosuvastatin  (CRESTOR ) 5 MG tablet Take 1 tablet (5 mg total) by mouth daily. 03/31/24   Jeffrey Heron HERO, MD  Secukinumab  (COSENTYX  UNOREADY) 300 MG/2ML SOAJ Inject 300 mg into the skin every 28 (twenty-eight) days. 05/25/24   Jeannetta Lonni ORN, MD  triamcinolone  cream (KENALOG ) 0.1 % Apply 1 Application topically 2 (two)  times daily as needed. Patient not taking: Reported on 06/23/2024 06/01/24   Jeannetta Lonni ORN, MD    Scheduled Meds:  Chlorhexidine  Gluconate Cloth  6 each Topical Daily   insulin  aspart  0-9 Units Subcutaneous Q4H   pantoprazole  (PROTONIX ) IV  40 mg Intravenous Q12H   Continuous Infusions:  acetaminophen  1,000 mg (08/07/24 1840)   albumin  human 60 mL/hr at 08/07/24 1800   heparin  1,150 Units/hr (08/07/24 1835)   lactated ringers  125 mL/hr at 08/07/24 1915   norepinephrine  (LEVOPHED ) Adult  infusion 8 mcg/min (08/07/24 1800)   piperacillin -tazobactam (ZOSYN )  IV     vancomycin  175 mL/hr at 08/07/24 1800   [START ON 08/08/2024] vancomycin      PRN Meds: docusate sodium , HYDROmorphone  (DILAUDID ) injection, polyethylene glycol  Allergies:   No Known Allergies  Social History:   Social History   Socioeconomic History   Marital status: Divorced    Spouse name: Not on file   Number of children: Not on file   Years of education: Not on file   Highest education level: Not on file  Occupational History   Not on file  Tobacco Use   Smoking status: Every Day    Current packs/day: 1.00    Average packs/day: 1 pack/day for 34.0 years (34.0 ttl pk-yrs)    Types: Cigarettes    Passive exposure: Past   Smokeless tobacco: Never  Vaping Use   Vaping status: Never Used  Substance and Sexual Activity   Alcohol  use: Not Currently   Drug use: No   Sexual activity: Not Currently  Other Topics Concern   Not on file  Social History Narrative   Not on file   Social Drivers of Health   Financial Resource Strain: Not on file  Food Insecurity: Not on file  Transportation Needs: Not on file  Physical Activity: Not on file  Stress: Not on file  Social Connections: Not on file  Intimate Partner Violence: Not on file    Family History:    Family History  Problem Relation Age of Onset   Colon polyps Mother    Diabetes Mother    Diabetes type II Mother    Kidney disease Father    Hypertension Father    Hyperlipidemia Father    Heart disease Father    Stroke Father    CVA Father    CVA Maternal Grandmother    Hypertension Maternal Grandmother    Hyperlipidemia Maternal Grandmother    Heart disease Maternal Grandmother    Diabetes Maternal Grandmother    Hypertension Maternal Grandfather    Hyperlipidemia Maternal Grandfather    Heart disease Maternal Grandfather    Diabetes Maternal Grandfather    Hyperlipidemia Paternal Grandmother    Heart disease Paternal  Grandmother    Hyperlipidemia Paternal Grandfather    Heart disease Paternal Grandfather    Diabetes Paternal Grandfather    Depression Paternal Grandfather    Cancer - Colon Other    Colon cancer Neg Hx    Esophageal cancer Neg Hx    Rectal cancer Neg Hx    Stomach cancer Neg Hx      ROS:  Please see the history of present illness.   All other ROS reviewed and negative.     Physical Exam/Data: Vitals:   08/07/24 1800 08/07/24 1815 08/07/24 1830 08/07/24 1845  BP: 98/68 112/80 102/73 102/78  Pulse: (!) 103 (!) 111 84 (!) 109  Resp: (!) 28 (!) 27 (!) 26 (!) 33  Temp:  TempSrc:      SpO2: 100% 95% 98% 98%    Intake/Output Summary (Last 24 hours) at 08/07/2024 1929 Last data filed at 08/07/2024 1800 Gross per 24 hour  Intake 2257.38 ml  Output --  Net 2257.38 ml      06/23/2024   10:32 AM 06/09/2024   10:35 AM 06/01/2024    3:13 PM  Last 3 Weights  Weight (lbs) 209 lb 209 lb 209 lb 12.8 oz  Weight (kg) 94.802 kg 94.802 kg 95.165 kg     There is no height or weight on file to calculate BMI.  General:  Well nourished, well developed, in no acute distress HEENT: normal Neck: no JVD Vascular: No carotid bruits; Distal pulses 2+ bilaterally Cardiac:  normal S1, S2; RRR; no murmur  Lungs:  clear to auscultation bilaterally, no wheezing, rhonchi or rales  Abd: soft, nontender, no hepatomegaly  Ext: no edema Musculoskeletal:  No deformities, BUE and BLE strength normal and equal Skin: warm and dry  Neuro:  CNs 2-12 intact, no focal abnormalities noted Psych:  Normal affect   EKG:  The EKG was personally reviewed and demonstrates:  sinus tachycardia with non-specific ST changes  Relevant CV Studies: TTE 07/2023:  - Left ventricle: The cavity size was normal. Wall thickness    was normal. Systolic function was vigorous - near cavity    obleteration. The estimated ejection fraction was in the    range of 65% to 70%. Although no diagnostic regional wall    motion  abnormality was identified, this possibility cannot    be completely excluded on the basis of this study. Doppler    parameters are consistent with abnormal left ventricular    relaxation (grade 1 diastolic dysfunction).  - Left atrium: The atrium was mildly dilated.  - Atrial septum: No defect or patent foramen ovale was    identified.  Transthoracic echocardiography.  M-mode, complete 2D,  spectral Doppler, and color Doppler.  Height:  Height:  182.9cm. Height: 72in.  Weight:  Weight: 90.7kg. Weight:  199.6lb.  Body mass index:  BMI: 27.1kg/m^2.  Body surface  area:    BSA: 2.21m^2.  Blood pressure:     127/89.   Laboratory Data: High Sensitivity Troponin:   Recent Labs  Lab 08/07/24 1529  TROPONINIHS 1,350*     Chemistry Recent Labs  Lab 08/07/24 1529 08/07/24 1533  NA 130* 129*  129*  K 4.3 4.3  4.3  CL 99 97*  CO2 19*  --   GLUCOSE 163* 165*  BUN 14 15  CREATININE 1.21 1.10  CALCIUM  8.7*  --   GFRNONAA >60  --   ANIONGAP 12  --     Recent Labs  Lab 08/07/24 1529  PROT 5.7*  ALBUMIN  2.9*  AST 27  ALT 40  ALKPHOS 40  BILITOT 0.5   Lipids No results for input(s): CHOL, TRIG, HDL, LABVLDL, LDLCALC, CHOLHDL in the last 168 hours.  Hematology Recent Labs  Lab 08/07/24 1529 08/07/24 1533  WBC 17.7*  --   RBC 4.02*  --   HGB 11.6* 11.6*  11.6*  HCT 34.9* 34.0*  34.0*  MCV 86.8  --   MCH 28.9  --   MCHC 33.2  --   RDW 13.6  --   PLT 196  --    Thyroid  No results for input(s): TSH, FREET4 in the last 168 hours.  BNP Recent Labs  Lab 08/07/24 1529  BNP 190.5*    DDimer No results  for input(s): DDIMER in the last 168 hours.  Radiology/Studies:  US  EKG SITE RITE Result Date: 08/07/2024 If Site Rite image not attached, placement could not be confirmed due to current cardiac rhythm.  CT ANGIO HEAD NECK W WO CM Result Date: 08/07/2024 CLINICAL DATA:  Vertebral artery dissection suspected. Syncope. Chest pain. EXAM: CT ANGIOGRAPHY  HEAD AND NECK WITH AND WITHOUT CONTRAST TECHNIQUE: Multidetector CT imaging of the head and neck was performed using the standard protocol during bolus administration of intravenous contrast. Multiplanar CT image reconstructions and MIPs were obtained to evaluate the vascular anatomy. Carotid stenosis measurements (when applicable) are obtained utilizing NASCET criteria, using the distal internal carotid diameter as the denominator. RADIATION DOSE REDUCTION: This exam was performed according to the departmental dose-optimization program which includes automated exposure control, adjustment of the mA and/or kV according to patient size and/or use of iterative reconstruction technique. CONTRAST:  OMNIPAQUE  IOHEXOL  350 MG/ML SOLN COMPARISON:  Head CT 06/02/2015 FINDINGS: CT HEAD FINDINGS Brain: There is no evidence of an acute infarct, intracranial hemorrhage, mass, midline shift, or extra-axial fluid collection. Cerebral white matter hypodensities are nonspecific but compatible with mild chronic small vessel ischemic disease. There is mild cerebellar greater than cerebral atrophy. Vascular: Calcified atherosclerosis at the skull base. Skull: No fracture or suspicious lesion. Sinuses/Orbits: Paranasal sinuses and mastoid air cells are clear. Unremarkable orbits. Other: None. Review of the MIP images confirms the above findings CTA NECK FINDINGS Aortic arch: Standard branching with mild atherosclerosis. Widely patent brachiocephalic and left subclavian arteries. The right subclavian artery appears patent proximally but is otherwise largely obscured by streak artifact from dense venous contrast. Right carotid system: The proximal common carotid artery is obscured by streak artifact, however the vessel is widely patent and normal in appearance more distally. The cervical ICA is patent with mild scattered plaque not resulting in significant stenosis. Left carotid system: Patent with a small amount of plaque at the  carotid bifurcation and distal cervical ICA without evidence of a significant stenosis or dissection. Vertebral arteries: Patent and codominant. Dense venous contrast completely obscures the right V2 segment in the C4-5 region. There is no evidence of a flow limiting stenosis or dissection elsewhere. Skeleton: Moderate multilevel cervical disc degeneration and left C2-3 facet arthrosis. Other neck: No evidence of cervical lymphadenopathy or mass, with streak artifact limiting assessment of the lower neck/thoracic inlet. Venous gas scattered throughout the neck and head, presumably iatrogenic. Upper chest: Clear lung apices. Review of the MIP images confirms the above findings CTA HEAD FINDINGS Anterior circulation: The internal carotid arteries are patent from skull base to carotid termini with mild atherosclerotic calcification not resulting in significant stenosis. ACAs and MCAs are patent without evidence of a proximal branch occlusion or significant proximal stenosis. No aneurysm is identified. Posterior circulation: The intracranial vertebral arteries are widely patent to the basilar with mild atheromatous irregularity involving the right greater than left V4 segments. The right PICA and left AICA appear dominant. Patent SCA origins are visualized bilaterally. The basilar artery is widely patent. There are large left and diminutive or absent right posterior communicating arteries with hypoplasia of the left P1 segment. Both PCAs are patent without evidence of a significant proximal stenosis. No aneurysm is identified. Venous sinuses: As permitted by contrast timing, patent. Anatomic variants: Fetal left PCA. Review of the MIP images confirms the above findings IMPRESSION: 1. No evidence of acute intracranial abnormality. 2. Mild atherosclerosis in the head and neck without a large vessel occlusion, significant  stenosis, or dissection identified within above described limitations. Electronically Signed   By:  Dasie Hamburg M.D.   On: 08/07/2024 16:27   CT Angio Chest/Abd/Pel for Dissection W and/or Wo Contrast Result Date: 08/07/2024 CLINICAL DATA:  Acute aortic syndrome suspected. EXAM: CT ANGIOGRAPHY CHEST, ABDOMEN AND PELVIS TECHNIQUE: Non-contrast CT of the chest was initially obtained. Multidetector CT imaging through the chest, abdomen and pelvis was performed using the standard protocol during bolus administration of intravenous contrast. Multiplanar reconstructed images and MIPs were obtained and reviewed to evaluate the vascular anatomy. RADIATION DOSE REDUCTION: This exam was performed according to the departmental dose-optimization program which includes automated exposure control, adjustment of the mA and/or kV according to patient size and/or use of iterative reconstruction technique. CONTRAST:  OMNIPAQUE  IOHEXOL  350 MG/ML SOLN COMPARISON:  CT abdomen 02/25/2013 FINDINGS: CTA CHEST FINDINGS Cardiovascular: Preferential opacification of the thoracic aorta. No evidence of thoracic aortic aneurysm or dissection. Normal heart size. No pericardial effusion. Mediastinum/Nodes: There is a small hiatal hernia. The esophagus appears within normal limits. There are 2 hypodense left thyroid  nodules measuring up to 6 mm. There is a small amount of pneumomediastinum anteriorly. Enlarged subcarinal lymph nodes measure up to 17 mm short axis. Enlarged right hilar lymph nodes measure up to 11 mm short axis. Enlarged precarinal lymph node measures 14 mm. There also prominent, but nonenlarged paratracheal and AP window lymph nodes. Lungs/Pleura: There is a trace left pleural effusion. There are minimal ground-glass opacities in the inferior left lower lobe. The lungs are otherwise clear. There is no pneumothorax. Musculoskeletal: There is a small amount of air seen within the anterior chest wall, possibly vascular in origin. No acute fractures are seen. Review of the MIP images confirms the above findings. CTA ABDOMEN  AND PELVIS FINDINGS VASCULAR Aorta: Normal caliber aorta without aneurysm, dissection, vasculitis or significant stenosis. There is moderate calcified atherosclerotic disease throughout the aorta. Celiac: Patent without evidence of aneurysm, dissection, vasculitis or significant stenosis. SMA: Patent without evidence of aneurysm, dissection, vasculitis or significant stenosis. Renals: Both renal arteries are patent without evidence of aneurysm, dissection, vasculitis, fibromuscular dysplasia or significant stenosis. IMA: Patent without evidence of aneurysm, dissection, vasculitis or significant stenosis. Inflow: Patent without evidence of aneurysm, dissection, vasculitis or significant stenosis. Mild calcified atherosclerotic disease present. Veins: No obvious venous abnormality within the limitations of this arterial phase study. Review of the MIP images confirms the above findings. NON-VASCULAR Hepatobiliary: No focal liver abnormality is seen. No gallstones, gallbladder wall thickening, or biliary dilatation. Pancreas: Unremarkable. No pancreatic ductal dilatation or surrounding inflammatory changes. Spleen: Normal in size without focal abnormality. Adrenals/Urinary Tract: There is mild nonspecific thickening of the bilateral adrenal glands. The bilateral kidneys appear within normal limits. The bladder is within normal limits. Stomach/Bowel: Stomach is within normal limits. Appendix appears normal. No evidence of bowel wall thickening, distention, or inflammatory changes. There is a large amount of stool throughout the colon. There is sigmoid colon diverticulosis. Lymphatic: There are enlarged periportal lymph nodes measuring up to 16 mm short axis. There are nonenlarged retroperitoneal, iliac chain and inguinal lymph nodes. Reproductive: Prostate is unremarkable. Other: There is a small fat containing right inguinal hernia. There is a small fat containing umbilical hernia. No free fluid. Musculoskeletal: No  acute or significant osseous findings. Review of the MIP images confirms the above findings. IMPRESSION: 1. No evidence for aortic dissection or aneurysm. 2. Small amount of pneumomediastinum. 3. Trace left pleural effusion. 4. Minimal ground-glass opacities in the left  lower lobe may be infectious/inflammatory. 5. Mediastinal and right hilar lymphadenopathy of uncertain etiology. 6. Periportal lymphadenopathy of uncertain etiology. 7. Sigmoid colon diverticulosis. 8. Subcentimeter incidental left thyroid  nodule. Not clinically significant; no follow-up imaging recommended (ref: J Am Coll Radiol. 2015 Feb;12(2): 143-50). Aortic Atherosclerosis (ICD10-I70.0). Electronically Signed   By: Greig Pique M.D.   On: 08/07/2024 16:24     Assessment and Plan: Myocardial injury vs NSTEMI  Patient presents with hypotension and shock, likely from an infectious etiology. No stigmata of cardiogenic shock on physical examination. No pericardial effusion on CT chest. Chest pain appears non-cardiac and most consistent with pleuritic chest pain and may be more related to pneumomediastinum seen on CT chest. He has evidence of cardiac injury with troponin level of 1350. EKG with no nonspecific ST changes. Although this is less likely to be type 1 NSTEMI and more likely to be myocardial injury vs type 2 NSTEMI in the setting of likely distributive shock, I'd treat as ACS until we gather more data as below. - Obtain another value of  troponin to check delta 3 - Obtain TTE to assess LVEF and wall motion  - ASA 324 mg - Start heparin  gtt   Addendum: Repeat troponin is 1336 from 1350; this flat nature is likely consistent with myocardial injury from shock rather than true ACS. For now, continue heparin  until TTE is obtained. If no WMA on TTE, would consider stopping heparin  gtt at that time.   Risk Assessment/Risk Scores:    TIMI Risk Score for Unstable Angina or Non-ST Elevation MI:   The patient's TIMI risk score is  1, which indicates a 5% risk of all cause mortality, new or recurrent myocardial infarction or need for urgent revascularization in the next 14 days.{         For questions or updates, please contact [facility] Please consult [email or web address] for contact info under      Signed, Gillian CHRISTELLA Cass, MD  08/07/2024 7:29 PM

## 2024-08-07 NOTE — ED Provider Notes (Signed)
 [place] [facility] Provider Note   CSN: [id number] Arrival date & time: 08/07/24  1515    Patient presents with: Loss of Consciousness   Jeffrey Black. is a 63 y.o. male history of psoriasis, hepatic steatosis, ETOH use in remission here for evaluation of chest pain and syncope.  Patient states he had chest pain that started 2 days ago.  Radiates into his back.  Does not extend into the legs or arms.  States he has had multiple syncopal episodes.  No headache.  Feels generally weak.  Has some mild upper abdominal pain.  No history of AAA, dissection.  No chronic NSAID use, he has EtOH use in remission.  He denies any melena or bright per rectum.  States he recently had a colonoscopy about a month ago which was negative.  Follows with [facility]. Denies fever, diarrhea, emesis, cough, hemoptysis, hematemesis.   HPI     Prior to Admission medications   Medication Sig Start Date End Date Taking? Authorizing Provider  Accu-Chek Softclix Lancets lancets  08/28/23   [provider]  Blood Glucose Monitoring Suppl DEVI 1 each by Does not apply route in the morning, at noon, and at bedtime. May substitute to any manufacturer covered by patient's insurance. 02/06/23   [name], MD  clobetasol  ointment (TEMOVATE ) 0.05 % Apply 1 Application topically 2 (two) times daily. Patient not taking: Reported on 06/09/2024 02/06/23   [name], MD  empagliflozin  (JARDIANCE ) 25 MG TABS tablet Take 1 tablet (25 mg total) by mouth daily before breakfast. 03/31/24   [name], MD  Glucose Blood (BLOOD GLUCOSE TEST STRIPS) STRP 1 each by In Vitro route daily. May substitute to any manufacturer covered by patient's insurance. Patient not taking: Reported on 06/23/2024 02/06/23   [name], MD  metFORMIN  (GLUCOPHAGE ) 500 MG tablet Take 2 tablets (1,000 mg total) by mouth 2 (two) times daily with a meal. 04/22/24   [name], MD   olmesartan  (BENICAR ) 5 MG tablet Take 1 tablet (5 mg total) by mouth daily. 03/31/24   [name], MD  rosuvastatin  (CRESTOR ) 5 MG tablet Take 1 tablet (5 mg total) by mouth daily. 03/31/24   [name], MD  Secukinumab  (COSENTYX  UNOREADY) 300 MG/2ML SOAJ Inject 300 mg into the skin every 28 (twenty-eight) days. 05/25/24   Rice, Lonni ORN, MD  triamcinolone  cream (KENALOG ) 0.1 % Apply 1 Application topically 2 (two) times daily as needed. Patient not taking: Reported on 06/23/2024 06/01/24   Jeannetta Lonni ORN, MD    Allergies: Patient has no known allergies.    Review of Systems  Unable to perform ROS: Acuity of condition  All other systems reviewed and are negative.   Updated Vital Signs BP 107/81   Pulse (!) 101   Temp 98.3 F (36.8 C) (Oral)   Resp (!) 28   SpO2 98%   Physical Exam Constitutional:      General: He is in acute distress.     Appearance: He is ill-appearing, toxic-appearing and diaphoretic.  HENT:     Head: Normocephalic.     Mouth/Throat:     Mouth: Mucous membranes are dry.  Cardiovascular:     Rate and Rhythm: Tachycardia present.     Pulses:          Radial pulses are 2+ on the right side and 2+ on the left side.       Dorsalis pedis  pulses are 2+ on the right side and 2+ on the left side.     Heart sounds: Normal heart sounds.  Pulmonary:     Effort: Pulmonary effort is normal.     Breath sounds: Normal breath sounds.  Abdominal:     Tenderness: There is abdominal tenderness. There is no guarding or rebound.     Comments: Diffuse upper abdominal tenderness, no large pulsatile mass  Musculoskeletal:        General: No swelling, tenderness or signs of injury. Normal range of motion.     Right lower leg: No edema.     Left lower leg: No edema.     Comments: No bony tenderness, compartments soft, no lower extremity edema  Skin:    Coloration: Skin is pale.  Neurological:     Comments: No obvious facial droop Equal strength  bilaterally however global weakness 3/5 strength upper and lower extremities     (all labs ordered are listed, but only abnormal results are displayed) Labs Reviewed  CBC WITH DIFFERENTIAL/PLATELET - Abnormal; Notable for the following components:      Result Value   WBC 17.7 (*)    RBC 4.02 (*)    Hemoglobin 11.6 (*)    HCT 34.9 (*)    Neutro Abs 15.0 (*)    Monocytes Absolute 1.2 (*)    Abs Immature Granulocytes 0.09 (*)    All other components within normal limits  COMPREHENSIVE METABOLIC PANEL WITH GFR - Abnormal; Notable for the following components:   Sodium 130 (*)    CO2 19 (*)    Glucose, Bld 163 (*)    Calcium  8.7 (*)    Total Protein 5.7 (*)    Albumin  2.9 (*)    All other components within normal limits  PROTIME-INR - Abnormal; Notable for the following components:   Prothrombin Time 17.5 (*)    INR 1.4 (*)    All other components within normal limits  BRAIN NATRIURETIC PEPTIDE - Abnormal; Notable for the following components:   B Natriuretic Peptide 190.5 (*)    All other components within normal limits  GLUCOSE, CAPILLARY - Abnormal; Notable for the following components:   Glucose-Capillary 131 (*)    All other components within normal limits  SEDIMENTATION RATE - Abnormal; Notable for the following components:   Sed Rate 17 (*)    All other components within normal limits  I-STAT CHEM 8, ED - Abnormal; Notable for the following components:   Sodium 129 (*)    Chloride 97 (*)    Glucose, Bld 165 (*)    TCO2 20 (*)    Hemoglobin 11.6 (*)    HCT 34.0 (*)    All other components within normal limits  I-STAT CG4 LACTIC ACID, ED - Abnormal; Notable for the following components:   Lactic Acid, Venous 2.4 (*)    All other components within normal limits  CBG MONITORING, ED - Abnormal; Notable for the following components:   Glucose-Capillary 160 (*)    All other components within normal limits  I-STAT VENOUS BLOOD GAS, ED - Abnormal; Notable for the following  components:   pCO2, Ven 39.3 (*)    Acid-base deficit 5.0 (*)    Sodium 129 (*)    HCT 34.0 (*)    Hemoglobin 11.6 (*)    All other components within normal limits  TROPONIN I (HIGH SENSITIVITY) - Abnormal; Notable for the following components:   Troponin I (High Sensitivity) 1,350 (*)  All other components within normal limits  TROPONIN I (HIGH SENSITIVITY) - Abnormal; Notable for the following components:   Troponin I (High Sensitivity) 1,336 (*)    All other components within normal limits  MRSA NEXT GEN BY PCR, NASAL  RESPIRATORY PANEL BY PCR  CULTURE, BLOOD (ROUTINE X 2)  CULTURE, BLOOD (ROUTINE X 2)  SARS CORONAVIRUS 2 BY RT PCR  LIPASE, BLOOD  PROCALCITONIN  URINALYSIS, W/ REFLEX TO CULTURE (INFECTION SUSPECTED)  URINE DRUGS OF ABUSE SCREEN W ALC, ROUTINE (REF LAB)  CBC  MAGNESIUM   PHOSPHORUS  BASIC METABOLIC PANEL WITH GFR  HIV ANTIBODY (ROUTINE TESTING W REFLEX)  HEPARIN  LEVEL (UNFRACTIONATED)  I-STAT CG4 LACTIC ACID, ED  TYPE AND SCREEN  ABO/RH    EKG: EKG Interpretation Date/Time:  Sunday August 07 2024 15:23:33 EDT Ventricular Rate:  76 PR Interval:  175 QRS Duration:  89 QT Interval:  372 QTC Calculation: 419 R Axis:   81  Text Interpretation: Sinus rhythm Borderline right axis deviation Borderline low voltage, extremity leads when compared to prior, lower voltage no STEMI Confirmed by Ginger Barefoot ([id number]) on 08/07/2024 3:44:17 PM  Radiology: US  EKG SITE RITE Result Date: 08/07/2024 If Site Rite image not attached, placement could not be confirmed due to current cardiac rhythm.  CT ANGIO HEAD NECK W WO CM Result Date: 08/07/2024 CLINICAL DATA:  Vertebral artery dissection suspected. Syncope. Chest pain. EXAM: CT ANGIOGRAPHY HEAD AND NECK WITH AND WITHOUT CONTRAST TECHNIQUE: Multidetector CT imaging of the head and neck was performed using the standard protocol during bolus administration of intravenous contrast. Multiplanar CT image reconstructions  and MIPs were obtained to evaluate the vascular anatomy. Carotid stenosis measurements (when applicable) are obtained utilizing NASCET criteria, using the distal internal carotid diameter as the denominator. RADIATION DOSE REDUCTION: This exam was performed according to the departmental dose-optimization program which includes automated exposure control, adjustment of the mA and/or kV according to patient size and/or use of iterative reconstruction technique. CONTRAST:  OMNIPAQUE  IOHEXOL  350 MG/ML SOLN COMPARISON:  Head CT 06/02/2015 FINDINGS: CT HEAD FINDINGS Brain: There is no evidence of an acute infarct, intracranial hemorrhage, mass, midline shift, or extra-axial fluid collection. Cerebral white matter hypodensities are nonspecific but compatible with mild chronic small vessel ischemic disease. There is mild cerebellar greater than cerebral atrophy. Vascular: Calcified atherosclerosis at the skull base. Skull: No fracture or suspicious lesion. Sinuses/Orbits: Paranasal sinuses and mastoid air cells are clear. Unremarkable orbits. Other: None. Review of the MIP images confirms the above findings CTA NECK FINDINGS Aortic arch: Standard branching with mild atherosclerosis. Widely patent brachiocephalic and left subclavian arteries. The right subclavian artery appears patent proximally but is otherwise largely obscured by streak artifact from dense venous contrast. Right carotid system: The proximal common carotid artery is obscured by streak artifact, however the vessel is widely patent and normal in appearance more distally. The cervical ICA is patent with mild scattered plaque not resulting in significant stenosis. Left carotid system: Patent with a small amount of plaque at the carotid bifurcation and distal cervical ICA without evidence of a significant stenosis or dissection. Vertebral arteries: Patent and codominant. Dense venous contrast completely obscures the right V2 segment in the C4-5 region.  There is no evidence of a flow limiting stenosis or dissection elsewhere. Skeleton: Moderate multilevel cervical disc degeneration and left C2-3 facet arthrosis. Other neck: No evidence of cervical lymphadenopathy or mass, with streak artifact limiting assessment of the lower neck/thoracic inlet. Venous gas scattered throughout the neck and  head, presumably iatrogenic. Upper chest: Clear lung apices. Review of the MIP images confirms the above findings CTA HEAD FINDINGS Anterior circulation: The internal carotid arteries are patent from skull base to carotid termini with mild atherosclerotic calcification not resulting in significant stenosis. ACAs and MCAs are patent without evidence of a proximal branch occlusion or significant proximal stenosis. No aneurysm is identified. Posterior circulation: The intracranial vertebral arteries are widely patent to the basilar with mild atheromatous irregularity involving the right greater than left V4 segments. The right PICA and left AICA appear dominant. Patent SCA origins are visualized bilaterally. The basilar artery is widely patent. There are large left and diminutive or absent right posterior communicating arteries with hypoplasia of the left P1 segment. Both PCAs are patent without evidence of a significant proximal stenosis. No aneurysm is identified. Venous sinuses: As permitted by contrast timing, patent. Anatomic variants: Fetal left PCA. Review of the MIP images confirms the above findings IMPRESSION: 1. No evidence of acute intracranial abnormality. 2. Mild atherosclerosis in the head and neck without a large vessel occlusion, significant stenosis, or dissection identified within above described limitations. Electronically Signed   By: Dasie Hamburg M.D.   On: 08/07/2024 16:27   CT Angio Chest/Abd/Pel for Dissection W and/or Wo Contrast Result Date: 08/07/2024 CLINICAL DATA:  Acute aortic syndrome suspected. EXAM: CT ANGIOGRAPHY CHEST, ABDOMEN AND PELVIS  TECHNIQUE: Non-contrast CT of the chest was initially obtained. Multidetector CT imaging through the chest, abdomen and pelvis was performed using the standard protocol during bolus administration of intravenous contrast. Multiplanar reconstructed images and MIPs were obtained and reviewed to evaluate the vascular anatomy. RADIATION DOSE REDUCTION: This exam was performed according to the departmental dose-optimization program which includes automated exposure control, adjustment of the mA and/or kV according to patient size and/or use of iterative reconstruction technique. CONTRAST:  OMNIPAQUE  IOHEXOL  350 MG/ML SOLN COMPARISON:  CT abdomen 02/25/2013 FINDINGS: CTA CHEST FINDINGS Cardiovascular: Preferential opacification of the thoracic aorta. No evidence of thoracic aortic aneurysm or dissection. Normal heart size. No pericardial effusion. Mediastinum/Nodes: There is a small hiatal hernia. The esophagus appears within normal limits. There are 2 hypodense left thyroid  nodules measuring up to 6 mm. There is a small amount of pneumomediastinum anteriorly. Enlarged subcarinal lymph nodes measure up to 17 mm short axis. Enlarged right hilar lymph nodes measure up to 11 mm short axis. Enlarged precarinal lymph node measures 14 mm. There also prominent, but nonenlarged paratracheal and AP window lymph nodes. Lungs/Pleura: There is a trace left pleural effusion. There are minimal ground-glass opacities in the inferior left lower lobe. The lungs are otherwise clear. There is no pneumothorax. Musculoskeletal: There is a small amount of air seen within the anterior chest wall, possibly vascular in origin. No acute fractures are seen. Review of the MIP images confirms the above findings. CTA ABDOMEN AND PELVIS FINDINGS VASCULAR Aorta: Normal caliber aorta without aneurysm, dissection, vasculitis or significant stenosis. There is moderate calcified atherosclerotic disease throughout the aorta. Celiac: Patent without  evidence of aneurysm, dissection, vasculitis or significant stenosis. SMA: Patent without evidence of aneurysm, dissection, vasculitis or significant stenosis. Renals: Both renal arteries are patent without evidence of aneurysm, dissection, vasculitis, fibromuscular dysplasia or significant stenosis. IMA: Patent without evidence of aneurysm, dissection, vasculitis or significant stenosis. Inflow: Patent without evidence of aneurysm, dissection, vasculitis or significant stenosis. Mild calcified atherosclerotic disease present. Veins: No obvious venous abnormality within the limitations of this arterial phase study. Review of the MIP images confirms the above  findings. NON-VASCULAR Hepatobiliary: No focal liver abnormality is seen. No gallstones, gallbladder wall thickening, or biliary dilatation. Pancreas: Unremarkable. No pancreatic ductal dilatation or surrounding inflammatory changes. Spleen: Normal in size without focal abnormality. Adrenals/Urinary Tract: There is mild nonspecific thickening of the bilateral adrenal glands. The bilateral kidneys appear within normal limits. The bladder is within normal limits. Stomach/Bowel: Stomach is within normal limits. Appendix appears normal. No evidence of bowel wall thickening, distention, or inflammatory changes. There is a large amount of stool throughout the colon. There is sigmoid colon diverticulosis. Lymphatic: There are enlarged periportal lymph nodes measuring up to 16 mm short axis. There are nonenlarged retroperitoneal, iliac chain and inguinal lymph nodes. Reproductive: Prostate is unremarkable. Other: There is a small fat containing right inguinal hernia. There is a small fat containing umbilical hernia. No free fluid. Musculoskeletal: No acute or significant osseous findings. Review of the MIP images confirms the above findings. IMPRESSION: 1. No evidence for aortic dissection or aneurysm. 2. Small amount of pneumomediastinum. 3. Trace left pleural  effusion. 4. Minimal ground-glass opacities in the left lower lobe may be infectious/inflammatory. 5. Mediastinal and right hilar lymphadenopathy of uncertain etiology. 6. Periportal lymphadenopathy of uncertain etiology. 7. Sigmoid colon diverticulosis. 8. Subcentimeter incidental left thyroid  nodule. Not clinically significant; no follow-up imaging recommended (ref: J Am Coll Radiol. 2015 Feb;12(2): 143-50). Aortic Atherosclerosis (ICD10-I70.0). Electronically Signed   By: Greig Pique M.D.   On: 08/07/2024 16:24     .Critical Care  Performed by: Edie Rosebud LABOR, PA-C Authorized by: Edie Rosebud LABOR, PA-C   Critical care provider statement:    Critical care time (minutes):  76   Critical care was necessary to treat or prevent imminent or life-threatening deterioration of the following conditions:  Shock, sepsis and cardiac failure   Critical care was time spent personally by me on the following activities:  Development of treatment plan with patient or surrogate, discussions with consultants, evaluation of patient's response to treatment, examination of patient, ordering and review of laboratory studies, ordering and review of radiographic studies, ordering and performing treatments and interventions, pulse oximetry, re-evaluation of patient's condition and review of old charts    Medications Ordered in the ED  norepinephrine  (LEVOPHED ) 4mg  in (0.016 mg/mL) premix infusion (8 mcg/min Intravenous Infusion Verify 08/07/24 1800)  Chlorhexidine  Gluconate Cloth 2 % PADS 6 each (6 each Topical Given 08/07/24 1722)  docusate sodium  (COLACE) capsule 100 mg (has no administration in time range)  polyethylene glycol (MIRALAX  / GLYCOLAX ) packet 17 g (has no administration in time range)  insulin  aspart (novoLOG ) injection 0-9 Units (1 Units Subcutaneous Given 08/07/24 1730)  lactated ringers  infusion ( Intravenous New Bag/Given 08/07/24 1915)  albumin  human 25 % solution 25 g ( Intravenous  Infusion Verify 08/07/24 1800)  piperacillin -tazobactam (ZOSYN ) IVPB 3.375 g (0 g Intravenous Stopped 08/07/24 1936)    Followed by  piperacillin -tazobactam (ZOSYN ) IVPB 3.375 g (has no administration in time range)  vancomycin  (VANCOREADY) IVPB 750 mg/150 mL (has no administration in time range)  acetaminophen  (OFIRMEV ) IV 1,000 mg (1,000 mg Intravenous New Bag/Given 08/07/24 1840)  HYDROmorphone  (DILAUDID ) injection 0.5-1 mg (has no administration in time range)  pantoprazole  (PROTONIX ) injection 40 mg (40 mg Intravenous Given 08/07/24 1818)  heparin  ADULT infusion 100 units/mL (25000 units/250mL) (1,150 Units/hr Intravenous New Bag/Given 08/07/24 1835)  sodium chloride  0.9 % bolus 1,000 mL (0 mLs Intravenous Stopped 08/07/24 1619)  iohexol  (OMNIPAQUE ) 350 MG/ML injection 100 mL (100 mLs Intravenous Contrast Given 08/07/24 1559)  lactated  ringers  bolus 1,000 mL (0 mLs Intravenous Stopped 08/07/24 1641)  vancomycin  (VANCOREADY) IVPB 1750 mg/350 mL (0 mg Intravenous Stopped 08/07/24 1935)  aspirin  tablet 325 mg (325 mg Oral Given 08/07/24 1836)  heparin  bolus via infusion 4,000 Units (4,000 Units Intravenous Bolus from Bag 08/07/24 8255)   63 year old here for evaluation of chest pain and syncope.  On arrival patient acutely ill, hypotensive systolic 60.  He is pale, diaphoretic and extremis with chest pain going into his back.  Can answer questions however does appear to have some confusion.  No focal deficits.  Attending Dr. Tegeler at bedside.  POCUS ultrasound with large pericardial effusion, decreased EF, no large amount of blood in abdomen, history concerning for AAA, dissection.  He denies any history of GI bleeds, melena, blood per rectum.  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  Patient getting IV fluids, started on Levophed  for blood pressure support.  Code medical called.   BP improved with Levophed , IVF  He was taken emergently to the CT  scanner with nursing and myself  Patient reassessed. Bp improved. PCCM, Dr. Claudene consulted for admission. Cardiology paged.  Patient reassessed.  Still having some pain.  CTA head, neck, chest, abdomen pelvis with pneumomediastinum, left changes consistent with infection/inflammation  Patient reassessed.  BPs improved with IV fluid as well as Levophed .  Patient to be admitted to critical care.  Cardiology has not paged back yet.  Troponin resulted at 1350.  Critical care discussed with cardiology who will evaluate patient  Patient to be admitted to critical care.  Patient seen evaluate by attending, Dr. Ginger who is agreeable with above plan treatment, plan and disposition    Clinical Course as of 08/07/24 1954  Sun Aug 07, 2024  1530 Here for recurrent syncope, CP, back pain. Progressively confused with EMS. Arrival systolic 60. No focal deficits however confused suspect due to lack of perfusion. No hx of AAA, dissection. No melena, BRBPR. Repeat BP 45 systolic. Verbal orders for IVF, Levo. Attending at bedside Dr. Tegeler>> bedside POCUS ultrasound shows no large amount of free fluid emboli.  Appears to have a pericardial effusion, decreased EF. [BH]    Clinical Course User Index [BH] Latrice Storlie A, PA-C                                 Medical Decision Making Amount and/or Complexity of Data Reviewed Independent Historian: EMS External Data Reviewed: labs, radiology, ECG and notes. Labs: ordered. Decision-making details documented in ED Course. Radiology: ordered and independent interpretation performed. Decision-making details documented in ED Course. ECG/medicine tests: ordered and independent interpretation performed. Decision-making details documented in ED Course.  Risk OTC drugs. Prescription drug management. Decision regarding hospitalization. Diagnosis or treatment significantly limited by social determinants of health.       Final diagnoses:  Shock (HCC)   Pneumomediastinum (HCC)  Sepsis with acute liver failure and septic shock without hepatic coma, due to unspecified organism [facility])  NSTEMI (non-ST elevated myocardial infarction) [facility])    ED Discharge Orders     None          Jacklyn Branan A, PA-C 08/07/24 1955    Tegeler, Lonni PARAS, MD 08/07/24 2357

## 2024-08-08 ENCOUNTER — Inpatient Hospital Stay (HOSPITAL_COMMUNITY)

## 2024-08-08 DIAGNOSIS — R079 Chest pain, unspecified: Secondary | ICD-10-CM

## 2024-08-08 DIAGNOSIS — R579 Shock, unspecified: Secondary | ICD-10-CM | POA: Diagnosis not present

## 2024-08-08 DIAGNOSIS — R55 Syncope and collapse: Secondary | ICD-10-CM

## 2024-08-08 DIAGNOSIS — R609 Edema, unspecified: Secondary | ICD-10-CM | POA: Diagnosis not present

## 2024-08-08 DIAGNOSIS — R7989 Other specified abnormal findings of blood chemistry: Secondary | ICD-10-CM | POA: Diagnosis not present

## 2024-08-08 DIAGNOSIS — I10 Essential (primary) hypertension: Secondary | ICD-10-CM

## 2024-08-08 LAB — BASIC METABOLIC PANEL WITH GFR
Anion gap: 12 (ref 5–15)
BUN: 17 mg/dL (ref 8–23)
CO2: 16 mmol/L — ABNORMAL LOW (ref 22–32)
Calcium: 8.4 mg/dL — ABNORMAL LOW (ref 8.9–10.3)
Chloride: 103 mmol/L (ref 98–111)
Creatinine, Ser: 1.1 mg/dL (ref 0.61–1.24)
GFR, Estimated: >60 mL/min (ref >60)
Glucose, Bld: 114 mg/dL — ABNORMAL HIGH (ref 70–99)
Potassium: 4.3 mmol/L (ref 3.5–5.1)
Sodium: 131 mmol/L — ABNORMAL LOW (ref 135–145)

## 2024-08-08 LAB — ECHOCARDIOGRAM COMPLETE
Area-P 1/2: 6.77 cm2
Calc EF: 45.7 %
Height: 71.496 in
S' Lateral: 3.1 cm
Single Plane A2C EF: 31.4 %
Single Plane A4C EF: 65.8 %
Weight: 3432.12 [oz_av]

## 2024-08-08 LAB — GLUCOSE, CAPILLARY
Glucose-Capillary: 108 mg/dL — ABNORMAL HIGH (ref 70–99)
Glucose-Capillary: 110 mg/dL — ABNORMAL HIGH (ref 70–99)
Glucose-Capillary: 111 mg/dL — ABNORMAL HIGH (ref 70–99)
Glucose-Capillary: 119 mg/dL — ABNORMAL HIGH (ref 70–99)
Glucose-Capillary: 133 mg/dL — ABNORMAL HIGH (ref 70–99)
Glucose-Capillary: 157 mg/dL — ABNORMAL HIGH (ref 70–99)

## 2024-08-08 LAB — CBC
HCT: 34 % — ABNORMAL LOW (ref 39.0–52.0)
Hemoglobin: 11.2 g/dL — ABNORMAL LOW (ref 13.0–17.0)
MCH: 28.3 pg (ref 26.0–34.0)
MCHC: 32.9 g/dL (ref 30.0–36.0)
MCV: 85.9 fL (ref 80.0–100.0)
Platelets: 193 K/uL (ref 150–400)
RBC: 3.96 MIL/uL — ABNORMAL LOW (ref 4.22–5.81)
RDW: 13.8 % (ref 11.5–15.5)
WBC: 15.6 K/uL — ABNORMAL HIGH (ref 4.0–10.5)
nRBC: 0 % (ref 0.0–0.2)

## 2024-08-08 LAB — TROPONIN I (HIGH SENSITIVITY)
Troponin I (High Sensitivity): 2114 ng/L (ref <18)
Troponin I (High Sensitivity): 1143 ng/L (ref <18)

## 2024-08-08 LAB — LACTIC ACID, PLASMA: Lactic Acid, Venous: 1.5 mmol/L (ref 0.5–1.9)

## 2024-08-08 LAB — PHOSPHORUS: Phosphorus: 3.2 mg/dL (ref 2.5–4.6)

## 2024-08-08 LAB — HIV ANTIBODY (ROUTINE TESTING W REFLEX): HIV Screen 4th Generation wRfx: NONREACTIVE

## 2024-08-08 LAB — MAGNESIUM: Magnesium: 1.9 mg/dL (ref 1.7–2.4)

## 2024-08-08 LAB — HEPARIN LEVEL (UNFRACTIONATED)
Heparin Unfractionated: <0.1 [IU]/mL — ABNORMAL LOW (ref 0.30–0.70)
Heparin Unfractionated: <0.1 [IU]/mL — ABNORMAL LOW (ref 0.30–0.70)
Heparin Unfractionated: <0.1 [IU]/mL — ABNORMAL LOW (ref 0.30–0.70)

## 2024-08-08 LAB — PSA: Prostatic Specific Antigen: 0.65 ng/mL (ref 0.00–4.00)

## 2024-08-08 MED ORDER — MAGNESIUM SULFATE 2 GM/50ML IV SOLN
2.0000 g | Freq: Once | INTRAVENOUS | Status: AC
  Administered 2024-08-08: 2 g via INTRAVENOUS
  Filled 2024-08-08: qty 50

## 2024-08-08 MED ORDER — PANTOPRAZOLE SODIUM 40 MG IV SOLR
40.0000 mg | Freq: Every day | INTRAVENOUS | Status: DC
  Administered 2024-08-08 – 2024-08-13 (×6): 40 mg via INTRAVENOUS
  Filled 2024-08-08 (×6): qty 10

## 2024-08-08 MED ORDER — ORAL CARE MOUTH RINSE: 15.0000 mL | OROMUCOSAL | Status: DC | PRN

## 2024-08-08 MED ORDER — HEPARIN BOLUS VIA INFUSION
3000.0000 [IU] | Freq: Once | INTRAVENOUS | Status: AC
  Administered 2024-08-08: 3000 [IU] via INTRAVENOUS
  Filled 2024-08-08: qty 3000

## 2024-08-08 MED ORDER — NICOTINE 14 MG/24HR TD PT24
14.0000 mg | MEDICATED_PATCH | Freq: Every day | TRANSDERMAL | Status: DC
  Administered 2024-08-08 – 2024-08-14 (×7): 14 mg via TRANSDERMAL
  Filled 2024-08-08 (×8): qty 1

## 2024-08-08 MED ORDER — IPRATROPIUM-ALBUTEROL 0.5-2.5 (3) MG/3ML IN SOLN
3.0000 mL | RESPIRATORY_TRACT | Status: DC | PRN
  Administered 2024-08-09 – 2024-08-10 (×4): 3 mL via RESPIRATORY_TRACT
  Filled 2024-08-08 (×3): qty 3

## 2024-08-08 NOTE — Plan of Care (Signed)
  Problem: Education: Goal: Ability to describe self-care measures that may prevent or decrease complications (Diabetes Survival Skills Education) will improve Outcome: Progressing   Problem: Coping: Goal: Ability to adjust to condition or change in health will improve Outcome: Progressing   Problem: Fluid Volume: Goal: Ability to maintain a balanced intake and output will improve Outcome: Progressing   Problem: Health Behavior/Discharge Planning: Goal: Ability to identify and utilize available resources and services will improve Outcome: Progressing   Problem: Metabolic: Goal: Ability to maintain appropriate glucose levels will improve Outcome: Progressing   Problem: Skin Integrity: Goal: Risk for impaired skin integrity will decrease Outcome: Progressing   Problem: Clinical Measurements: Goal: Ability to maintain clinical measurements within normal limits will improve Outcome: Progressing Goal: Respiratory complications will improve Outcome: Progressing Goal: Cardiovascular complication will be avoided Outcome: Progressing   Problem: Activity: Goal: Risk for activity intolerance will decrease Outcome: Progressing   Problem: Coping: Goal: Level of anxiety will decrease Outcome: Progressing   Problem: Elimination: Goal: Will not experience complications related to bowel motility Outcome: Progressing Goal: Will not experience complications related to urinary retention Outcome: Progressing   Problem: Pain Managment: Goal: General experience of comfort will improve and/or be controlled Outcome: Progressing   Problem: Safety: Goal: Ability to remain free from injury will improve Outcome: Progressing   Problem: Skin Integrity: Goal: Risk for impaired skin integrity will decrease Outcome: Progressing   Problem: Nutrition: Goal: Adequate nutrition will be maintained Outcome: Not Progressing

## 2024-08-08 NOTE — Progress Notes (Signed)
 PHARMACY - ANTICOAGULATION CONSULT NOTE  Pharmacy Consult for heparin  Indication: chest pain/ACS  No Known Allergies  Patient Measurements:    Vital Signs: Temp: 98.4 F (36.9 C) (09/21 2000) Temp Source: Oral (09/21 2000) BP: 97/78 (09/22 0145) Pulse Rate: 101 (09/22 0200)  Labs: Recent Labs    08/07/24 1529 08/07/24 1533 08/07/24 1740 08/08/24 0203  HGB 11.6* 11.6*  11.6*  --  11.2*  HCT 34.9* 34.0*  34.0*  --  34.0*  PLT 196  --   --  193  LABPROT 17.5*  --   --   --   INR 1.4*  --   --   --   HEPARINUNFRC  --   --   --  <0.10*  CREATININE 1.21 1.10  --  1.10  TROPONINIHS 1,350*  --  1,336*  --     CrCl cannot be calculated (Unknown ideal weight.).   Medical History: Past Medical History:  Diagnosis Date   Depression    Diabetes (HCC)    ETOH abuse    History of bronchitis    Hypertension    Psoriasis    Substance abuse (HCC)      Assessment: 58 YOM presenting with shock, pericardial pain, now concern for ACS, he is not on anticoagulation PTA. Troponins elevated.  Heparin  level is undetectable. No issues with infusion or bleeding reported. CBC stable   Goal of Therapy:  Heparin  level 0.3-0.7 units/ml Monitor platelets by anticoagulation protocol: Yes   Plan:  Increase heparin  infusion to 1450 units/hr F/u 6 hour heparin  level F/u TTE and cards recs   Thank you for allowing pharmacy to be a part of this patient's care.  Bascom JAYSON Louder, PharmD 08/08/2024 3:39 AM  **Pharmacist phone directory can be found on [email or web address] listed under [facility]**

## 2024-08-08 NOTE — Progress Notes (Signed)
 Progress Note  Patient Name: Jeffrey Black. Date of Encounter: 08/08/2024  [facility] Cardiologist: None   Patient Profile      Subjective   Denies any CP or SOB this am.  hsTrop 1350 and 1336.  Echo pending  Inpatient Medications    Scheduled Meds:  Chlorhexidine  Gluconate Cloth  6 each Topical Daily   insulin  aspart  0-9 Units Subcutaneous Q4H   nicotine   14 mg Transdermal Daily   pantoprazole  (PROTONIX ) IV  40 mg Intravenous QHS   Continuous Infusions:  acetaminophen  1,000 mg (08/08/24 0601)   albumin  human 60 mL/hr at 08/08/24 0600   heparin  1,450 Units/hr (08/08/24 0600)   norepinephrine  (LEVOPHED ) Adult infusion 5 mcg/min (08/08/24 0600)   piperacillin -tazobactam (ZOSYN )  IV 3.375 g (08/08/24 9366)   vancomycin  Stopped (08/08/24 0300)   PRN Meds: docusate sodium , HYDROmorphone  (DILAUDID ) injection, mouth rinse, polyethylene glycol   Vital Signs    Vitals:   08/08/24 0730 08/08/24 0737 08/08/24 0745 08/08/24 0800  BP: 97/79  95/80 100/78  Pulse: 91  92 97  Resp: 18  (!) 23 (!) 28  Temp:  97.7 F (36.5 C)    TempSrc:  Axillary    SpO2: 98%  98% 97%  Weight:        Intake/Output Summary (Last 24 hours) at 08/08/2024 0912 Last data filed at 08/08/2024 0635 Gross per 24 hour  Intake 4947.11 ml  Output 1350 ml  Net 3597.11 ml      08/08/2024    4:53 AM 06/23/2024   10:32 AM 06/09/2024   10:35 AM  Last 3 Weights  Weight (lbs) 214 lb 8.1 oz 209 lb 209 lb  Weight (kg) 97.3 kg 94.802 kg 94.802 kg      Telemetry    Normal sinus rhythm- Personally Reviewed  ECG    EKG from 08/07/2024 shows normal sinus rhythm promoters QRS no ST-T wave abnormality- Personally Reviewed  Physical Exam   GEN: No acute distress.   Neck: No JVD Cardiac: RRR, no murmurs, rubs, or gallops.  Respiratory: Clear to auscultation bilaterally. GI: Soft, nontender, non-distended  MS: No edema; No deformity. Neuro:  Nonfocal  Psych: Normal affect   Labs    High  Sensitivity Troponin:   Recent Labs  Lab 08/07/24 1529 08/07/24 1740  TROPONINIHS 1,350* 1,336*      Chemistry Recent Labs  Lab 08/07/24 1529 08/07/24 1533 08/08/24 0203  NA 130* 129*  129* 131*  K 4.3 4.3  4.3 4.3  CL 99 97* 103  CO2 19*  --  16*  GLUCOSE 163* 165* 114*  BUN 14 15 17   CREATININE 1.21 1.10 1.10  CALCIUM  8.7*  --  8.4*  PROT 5.7*  --   --   ALBUMIN  2.9*  --   --   AST 27  --   --   ALT 40  --   --   ALKPHOS 40  --   --   BILITOT 0.5  --   --   GFRNONAA >60  --  >60  ANIONGAP 12  --  12     Hematology Recent Labs  Lab 08/07/24 1529 08/07/24 1533 08/08/24 0203  WBC 17.7*  --  15.6*  RBC 4.02*  --  3.96*  HGB 11.6* 11.6*  11.6* 11.2*  HCT 34.9* 34.0*  34.0* 34.0*  MCV 86.8  --  85.9  MCH 28.9  --  28.3  MCHC 33.2  --  32.9  RDW 13.6  --  13.8  PLT 196  --  193    BNP Recent Labs  Lab 08/07/24 1529  BNP 190.5*     DDimer No results for input(s): DDIMER in the last 168 hours.   Radiology    US  EKG SITE RITE Result Date: 08/07/2024 If Site Rite image not attached, placement could not be confirmed due to current cardiac rhythm.  CT ANGIO HEAD NECK W WO CM Result Date: 08/07/2024 CLINICAL DATA:  Vertebral artery dissection suspected. Syncope. Chest pain. EXAM: CT ANGIOGRAPHY HEAD AND NECK WITH AND WITHOUT CONTRAST TECHNIQUE: Multidetector CT imaging of the head and neck was performed using the standard protocol during bolus administration of intravenous contrast. Multiplanar CT image reconstructions and MIPs were obtained to evaluate the vascular anatomy. Carotid stenosis measurements (when applicable) are obtained utilizing NASCET criteria, using the distal internal carotid diameter as the denominator. RADIATION DOSE REDUCTION: This exam was performed according to the departmental dose-optimization program which includes automated exposure control, adjustment of the mA and/or kV according to patient size and/or use of iterative  reconstruction technique. CONTRAST:  OMNIPAQUE  IOHEXOL  350 MG/ML SOLN COMPARISON:  Head CT 06/02/2015 FINDINGS: CT HEAD FINDINGS Brain: There is no evidence of an acute infarct, intracranial hemorrhage, mass, midline shift, or extra-axial fluid collection. Cerebral white matter hypodensities are nonspecific but compatible with mild chronic small vessel ischemic disease. There is mild cerebellar greater than cerebral atrophy. Vascular: Calcified atherosclerosis at the skull base. Skull: No fracture or suspicious lesion. Sinuses/Orbits: Paranasal sinuses and mastoid air cells are clear. Unremarkable orbits. Other: None. Review of the MIP images confirms the above findings CTA NECK FINDINGS Aortic arch: Standard branching with mild atherosclerosis. Widely patent brachiocephalic and left subclavian arteries. The right subclavian artery appears patent proximally but is otherwise largely obscured by streak artifact from dense venous contrast. Right carotid system: The proximal common carotid artery is obscured by streak artifact, however the vessel is widely patent and normal in appearance more distally. The cervical ICA is patent with mild scattered plaque not resulting in significant stenosis. Left carotid system: Patent with a small amount of plaque at the carotid bifurcation and distal cervical ICA without evidence of a significant stenosis or dissection. Vertebral arteries: Patent and codominant. Dense venous contrast completely obscures the right V2 segment in the C4-5 region. There is no evidence of a flow limiting stenosis or dissection elsewhere. Skeleton: Moderate multilevel cervical disc degeneration and left C2-3 facet arthrosis. Other neck: No evidence of cervical lymphadenopathy or mass, with streak artifact limiting assessment of the lower neck/thoracic inlet. Venous gas scattered throughout the neck and head, presumably iatrogenic. Upper chest: Clear lung apices. Review of the MIP images confirms the  above findings CTA HEAD FINDINGS Anterior circulation: The internal carotid arteries are patent from skull base to carotid termini with mild atherosclerotic calcification not resulting in significant stenosis. ACAs and MCAs are patent without evidence of a proximal branch occlusion or significant proximal stenosis. No aneurysm is identified. Posterior circulation: The intracranial vertebral arteries are widely patent to the basilar with mild atheromatous irregularity involving the right greater than left V4 segments. The right PICA and left AICA appear dominant. Patent SCA origins are visualized bilaterally. The basilar artery is widely patent. There are large left and diminutive or absent right posterior communicating arteries with hypoplasia of the left P1 segment. Both PCAs are patent without evidence of a significant proximal stenosis. No aneurysm is identified. Venous sinuses: As permitted by contrast timing, patent. Anatomic variants: Fetal left PCA.  Review of the MIP images confirms the above findings IMPRESSION: 1. No evidence of acute intracranial abnormality. 2. Mild atherosclerosis in the head and neck without a large vessel occlusion, significant stenosis, or dissection identified within above described limitations. Electronically Signed   By: Dasie Hamburg M.D.   On: 08/07/2024 16:27   CT Angio Chest/Abd/Pel for Dissection W and/or Wo Contrast Result Date: 08/07/2024 CLINICAL DATA:  Acute aortic syndrome suspected. EXAM: CT ANGIOGRAPHY CHEST, ABDOMEN AND PELVIS TECHNIQUE: Non-contrast CT of the chest was initially obtained. Multidetector CT imaging through the chest, abdomen and pelvis was performed using the standard protocol during bolus administration of intravenous contrast. Multiplanar reconstructed images and MIPs were obtained and reviewed to evaluate the vascular anatomy. RADIATION DOSE REDUCTION: This exam was performed according to the departmental dose-optimization program which includes  automated exposure control, adjustment of the mA and/or kV according to patient size and/or use of iterative reconstruction technique. CONTRAST:  OMNIPAQUE  IOHEXOL  350 MG/ML SOLN COMPARISON:  CT abdomen 02/25/2013 FINDINGS: CTA CHEST FINDINGS Cardiovascular: Preferential opacification of the thoracic aorta. No evidence of thoracic aortic aneurysm or dissection. Normal heart size. No pericardial effusion. Mediastinum/Nodes: There is a small hiatal hernia. The esophagus appears within normal limits. There are 2 hypodense left thyroid  nodules measuring up to 6 mm. There is a small amount of pneumomediastinum anteriorly. Enlarged subcarinal lymph nodes measure up to 17 mm short axis. Enlarged right hilar lymph nodes measure up to 11 mm short axis. Enlarged precarinal lymph node measures 14 mm. There also prominent, but nonenlarged paratracheal and AP window lymph nodes. Lungs/Pleura: There is a trace left pleural effusion. There are minimal ground-glass opacities in the inferior left lower lobe. The lungs are otherwise clear. There is no pneumothorax. Musculoskeletal: There is a small amount of air seen within the anterior chest wall, possibly vascular in origin. No acute fractures are seen. Review of the MIP images confirms the above findings. CTA ABDOMEN AND PELVIS FINDINGS VASCULAR Aorta: Normal caliber aorta without aneurysm, dissection, vasculitis or significant stenosis. There is moderate calcified atherosclerotic disease throughout the aorta. Celiac: Patent without evidence of aneurysm, dissection, vasculitis or significant stenosis. SMA: Patent without evidence of aneurysm, dissection, vasculitis or significant stenosis. Renals: Both renal arteries are patent without evidence of aneurysm, dissection, vasculitis, fibromuscular dysplasia or significant stenosis. IMA: Patent without evidence of aneurysm, dissection, vasculitis or significant stenosis. Inflow: Patent without evidence of aneurysm, dissection,  vasculitis or significant stenosis. Mild calcified atherosclerotic disease present. Veins: No obvious venous abnormality within the limitations of this arterial phase study. Review of the MIP images confirms the above findings. NON-VASCULAR Hepatobiliary: No focal liver abnormality is seen. No gallstones, gallbladder wall thickening, or biliary dilatation. Pancreas: Unremarkable. No pancreatic ductal dilatation or surrounding inflammatory changes. Spleen: Normal in size without focal abnormality. Adrenals/Urinary Tract: There is mild nonspecific thickening of the bilateral adrenal glands. The bilateral kidneys appear within normal limits. The bladder is within normal limits. Stomach/Bowel: Stomach is within normal limits. Appendix appears normal. No evidence of bowel wall thickening, distention, or inflammatory changes. There is a large amount of stool throughout the colon. There is sigmoid colon diverticulosis. Lymphatic: There are enlarged periportal lymph nodes measuring up to 16 mm short axis. There are nonenlarged retroperitoneal, iliac chain and inguinal lymph nodes. Reproductive: Prostate is unremarkable. Other: There is a small fat containing right inguinal hernia. There is a small fat containing umbilical hernia. No free fluid. Musculoskeletal: No acute or significant osseous findings. Review of the MIP images  confirms the above findings. IMPRESSION: 1. No evidence for aortic dissection or aneurysm. 2. Small amount of pneumomediastinum. 3. Trace left pleural effusion. 4. Minimal ground-glass opacities in the left lower lobe may be infectious/inflammatory. 5. Mediastinal and right hilar lymphadenopathy of uncertain etiology. 6. Periportal lymphadenopathy of uncertain etiology. 7. Sigmoid colon diverticulosis. 8. Subcentimeter incidental left thyroid  nodule. Not clinically significant; no follow-up imaging recommended (ref: J Am Coll Radiol. 2015 Feb;12(2): 143-50). Aortic Atherosclerosis (ICD10-I70.0).  Electronically Signed   By: Greig Pique M.D.   On: 08/07/2024 16:24    Patient Profile     63 y.o. male with a hx of psoriasis, hepatic steatosis, tobacco use, and EtOH use (quit 2016) who is being seen 08/07/2024 for the evaluation of chest pain and elevated troponin.   Assessment & Plan    Chest pain Elevated troponin Hypotension -Patient presented with hypotension and shock felt likely related to sepsis -Chest pain was pleuritic and CT showed pneumomediastinum -Elevated high-sensitivity troponins at 1350 and 1336 -No ST changes on EKG -2D echo is pending -Suspect this is a type II NSTEMI from shock/hypotension with demand ischemia but given fever, chills and night sweats could also be consistent with acute viral myocarditis.  Unlikely to represent ACS -Cardiac risk factors include diabetes, hypertension, ongoing tobacco abuse and family history of heart disease -Continue IV heparin  drip for now -Further workup pending results of 2D echo.  If no wall motion abnormalities on echo then would stop heparin  drip  Hypertension - Presented with hypotension in the setting of shock - BP improved with IV fluids and now 102/79 mmHg - PTA Benicar  on hold      For questions or updates, please contact [facility] Please consult [email or web address] for contact info under        Signed, Wilbert Bihari, MD  08/08/2024, 9:12 AM

## 2024-08-08 NOTE — Plan of Care (Signed)
  Problem: Nutritional: Goal: Maintenance of adequate nutrition will improve Outcome: Progressing   Problem: Skin Integrity: Goal: Risk for impaired skin integrity will decrease Outcome: Progressing   Problem: Tissue Perfusion: Goal: Adequacy of tissue perfusion will improve Outcome: Progressing   Problem: Clinical Measurements: Goal: Ability to maintain clinical measurements within normal limits will improve Outcome: Progressing Goal: Will remain free from infection Outcome: Progressing Goal: Diagnostic test results will improve Outcome: Progressing Goal: Respiratory complications will improve Outcome: Progressing Goal: Cardiovascular complication will be avoided Outcome: Progressing

## 2024-08-08 NOTE — Progress Notes (Signed)
  Echocardiogram 2D Echocardiogram has been performed.  Devora Ellouise SAUNDERS 08/08/2024, 2:37 PM

## 2024-08-08 NOTE — Progress Notes (Signed)
 BLE venous duplex has been completed  Results can be found under chart review under CV PROC. 08/08/2024 12:21 PM Zophia Marrone RVT, RDMS

## 2024-08-08 NOTE — Progress Notes (Signed)
 [facility] ADULT ICU REPLACEMENT PROTOCOL   The patient does apply for the [facility] Adult ICU Electrolyte Replacment Protocol based on the criteria listed below:   1.Exclusion criteria: TCTS, ECMO, Dialysis, and Myasthenia Gravis patients 2. Is GFR >/= 30 ml/min? Yes.    Patient's GFR today is >60 3. Is SCr </= 2? Yes.   Patient's SCr is 1.10 mg/dL 4. Did SCr increase >/= 0.5 in 24 hours? No. 5.Pt's weight >40kg  Yes.   6. Abnormal electrolyte(s): Magnesium   7. Electrolytes replaced per protocol 8.  Call MD STAT for K+ </= 2.5, Phos </= 1, or Mag </= 1 Physician:  Dr. [place]  Sharlyn Odonnel A Sharry Beining 08/08/2024 4:51 AM

## 2024-08-08 NOTE — Progress Notes (Signed)
 PHARMACY - ANTICOAGULATION CONSULT NOTE  Pharmacy Consult for heparin  Indication: chest pain/ACS  No Known Allergies  Patient Measurements: Weight: 97.3 kg (214 lb 8.1 oz)  Vital Signs: Temp: 98.4 F (36.9 C) (09/22 1102) Temp Source: Oral (09/22 1102) BP: 98/80 (09/22 1130) Pulse Rate: 94 (09/22 1130)  Labs: Recent Labs    08/07/24 1529 08/07/24 1533 08/07/24 1740 08/08/24 0203 08/08/24 1022  HGB 11.6* 11.6*  11.6*  --  11.2*  --   HCT 34.9* 34.0*  34.0*  --  34.0*  --   PLT 196  --   --  193  --   LABPROT 17.5*  --   --   --   --   INR 1.4*  --   --   --   --   HEPARINUNFRC  --   --   --  <0.10* <0.10*  CREATININE 1.21 1.10  --  1.10  --   TROPONINIHS 1,350*  --  1,336*  --   --     Estimated Creatinine Clearance: 82.4 mL/min (by C-G formula based on SCr of 1.1 mg/dL).   Medical History: Past Medical History:  Diagnosis Date   Depression    Diabetes (HCC)    ETOH abuse    History of bronchitis    Hypertension    Psoriasis    Substance abuse (HCC)      Assessment: 78 YOM presenting with shock, pericardial pain, now concern for ACS, he is not on anticoagulation PTA. Troponins elevated.  HL <0.10 - undetectable + subtherapeutic No issues with line or infusion per RN. Infusion seems to be working correctly however, it doesn't draw back reliably - RN to exchange infusion site.   Goal of Therapy:  Heparin  level 0.3-0.7 units/ml Monitor platelets by anticoagulation protocol: Yes   Plan:  Increase heparin  infusion to 1800 units/hr F/u 6 hour heparin  level F/u TTE and cards recs   Sharyne Glatter, PharmD, BCCCP Critical Care Clinical Pharmacist 08/08/2024 11:43 AM

## 2024-08-08 NOTE — Progress Notes (Signed)
 NAME:  Edelmiro Innocent., MRN:  [id number], DOB:  Dec 21, 1960, LOS: 1 ADMISSION DATE:  08/07/2024, CONSULTATION DATE:  08/07/24 REFERRING MD:  EDP, CHIEF COMPLAINT:  CP   History of Present Illness:  63 year old man w/ hx of psoriasis, DM, prior EtOH abuse in remission p/w 2 days of chest pain.  Located sternal, worse with inspiration.  No clear inciting event except headaches.  + fevers/chills.  +syncope, recurrent.  Pain is sharp deep and radiates to back.  +orthostasis.  BP extremely low on admit, Received 1L LR started on pressors, and taken for CTA H/C/A/P showing pneumomediastinum, no dissection, no PE.  EKG no STEMI.   Pertinent  Medical History   Past Medical History:  Diagnosis Date   Depression    Diabetes (HCC)    ETOH abuse    History of bronchitis    Hypertension    Psoriasis    Substance abuse (HCC)    Last A1c 6.5%  Significant Hospital Events: Including procedures, antibiotic start and stop dates in addition to other pertinent events   9/21 admit 9/22 pressor requirement improving   Interim History / Subjective:  No current CP, remains on norepi , afebrile, no culture growth, on 2L [place]  Objective     General:  well nourished, ill-appearing M, resting in bed in NAD HEENT: MM pink/moist Neuro: alert and oriented without focal deficits  CV: s1s2 rrr, no m/r/g PULM:  mildly diminished in the bilateral bases without tachypnea or accessory muscle use  GI: soft, bsx4 active  Extremities: warm/dry, no edema    Labs: Na 131 Creatinine 1.1 Co2 16 Mag 1.9 WBC 15.6  Resolved problem list   Assessment and Plan  Shock Acute Hypoxic Respiratory Failure with Pneumomediastinum, mediastinal adenopathy Chest pain Unclear if symptoms are from a vital illness causing septic shock vs PE vs cardiogenic, received aggressive fluid resuscitation yesterday - with pain description could fit esophageal injury/rupture but imaging not that consistent nor is clinical  hx. -remains on 4mcg Levo, continue to maintain map >65 -no culture growth, continue zosyn  -repeat trop pending, cardiology consulted and suspect Type II NSTEMI vs viral myocarditis -continue heparin  gtt -echo and LE dopplers pending -consider CTA chest PE protocol pending the above results     HA-  CT CTA head okay, nonfocal neuro  Hyponatremia-  suspect acute phase reactant -improved, continue to trend    Labs   CBC: Recent Labs  Lab 08/07/24 1529 08/07/24 1533 08/08/24 0203  WBC 17.7*  --  15.6*  NEUTROABS 15.0*  --   --   HGB 11.6* 11.6*  11.6* 11.2*  HCT 34.9* 34.0*  34.0* 34.0*  MCV 86.8  --  85.9  PLT 196  --  193    Basic Metabolic Panel: Recent Labs  Lab 08/07/24 1529 08/07/24 1533 08/08/24 0203  NA 130* 129*  129* 131*  K 4.3 4.3  4.3 4.3  CL 99 97* 103  CO2 19*  --  16*  GLUCOSE 163* 165* 114*  BUN 14 15 17   CREATININE 1.21 1.10 1.10  CALCIUM  8.7*  --  8.4*  MG  --   --  1.9  PHOS  --   --  3.2   GFR: Estimated Creatinine Clearance: 82.4 mL/min (by C-G formula based on SCr of 1.1 mg/dL). Recent Labs  Lab 08/07/24 1529 08/07/24 1533 08/07/24 1740 08/08/24 0203  PROCALCITON  --   --  2.03  --   WBC 17.7*  --   --  15.6*  LATICACIDVEN  --  2.4*  --   --     Liver Function Tests: Recent Labs  Lab 08/07/24 1529  AST 27  ALT 40  ALKPHOS 40  BILITOT 0.5  PROT 5.7*  ALBUMIN  2.9*   Recent Labs  Lab 08/07/24 1529  LIPASE 11   No results for input(s): AMMONIA in the last 168 hours.  ABG    Component Value Date/Time   HCO3 20.8 08/07/2024 1533   TCO2 20 (L) 08/07/2024 1533   TCO2 22 08/07/2024 1533   ACIDBASEDEF 5.0 (H) 08/07/2024 1533   O2SAT 74 08/07/2024 1533     Coagulation Profile: Recent Labs  Lab 08/07/24 1529  INR 1.4*    Cardiac Enzymes: No results for input(s): CKTOTAL, CKMB, CKMBINDEX, TROPONINI in the last 168 hours.  HbA1C: Hemoglobin A1C  Date/Time Value Ref Range Status  03/31/2024  03:18 PM 6.5 (A) 4.0 - 5.6 % Final  09/24/2023 03:08 PM 7.1 (A) 4.0 - 5.6 % Final   Hgb A1c MFr Bld  Date/Time Value Ref Range Status  02/24/2013 06:40 AM 6.1 (H) <5.7 % Final    Comment:    (NOTE)                                                                       According to the ADA Clinical Practice Recommendations for 2011, when HbA1c is used as a screening test:  >=6.5%   Diagnostic of Diabetes Mellitus           (if abnormal result is confirmed) 5.7-6.4%   Increased risk of developing Diabetes Mellitus References:Diagnosis and Classification of Diabetes Mellitus,Diabetes Care,2011,34(Suppl 1):S62-S69 and Standards of Medical Care in         Diabetes - 2011,Diabetes Care,2011,34 (Suppl 1):S11-S61.    CBG: Recent Labs  Lab 08/07/24 1706 08/07/24 2003 08/07/24 2329 08/08/24 0343 08/08/24 0736  GLUCAP 131* 144* 110* 111* 110*    Review of Systems:    Positive Symptoms in bold:  Constitutional fevers, chills, weight loss, fatigue, anorexia, malaise  Eyes decreased vision, double vision, eye irritation  Ears, Nose, Mouth, Throat sore throat, trouble swallowing, sinus congestion  Cardiovascular chest pain, paroxysmal nocturnal dyspnea, lower ext edema, palpitations   Respiratory SOB, cough, DOE, hemoptysis, wheezing  Gastrointestinal nausea, vomiting, diarrhea  Genitourinary burning with urination, trouble urinating  Musculoskeletal joint aches, joint swelling, back pain  Integumentary  rashes, skin lesions  Neurological focal weakness, focal numbness, trouble speaking, headaches  Psychiatric depression, anxiety, confusion  Endocrine polyuria, polydipsia, cold intolerance, heat intolerance  Hematologic abnormal bruising, abnormal bleeding, unexplained nose bleeds  Allergic/Immunologic recurrent infections, hives, swollen lymph nodes     Past Medical History:  He,  has a past medical history of Depression, Diabetes (HCC), ETOH abuse, History of bronchitis,  Hypertension, Psoriasis, and Substance abuse (HCC).   Surgical History:   Past Surgical History:  Procedure Laterality Date   ESOPHAGOGASTRODUODENOSCOPY N/A 05/03/2013   Procedure: ESOPHAGOGASTRODUODENOSCOPY (EGD);  Surgeon: [name], MD;  Location: [facility];  Service: Endoscopy;  Laterality: N/A;   left 4th finger surgery       Social History:   reports that he has been smoking cigarettes. He has a 34 pack-year smoking history. He has  been exposed to tobacco smoke. He has never used smokeless tobacco. He reports that he does not currently use alcohol . He reports that he does not use drugs.   Family History:  His family history includes CVA in his father and maternal grandmother; Cancer - Colon in an other family member; Colon polyps in his mother; Depression in his paternal grandfather; Diabetes in his maternal grandfather, maternal grandmother, mother, and paternal grandfather; Diabetes type II in his mother; Heart disease in his father, maternal grandfather, maternal grandmother, paternal grandfather, and paternal grandmother; Hyperlipidemia in his father, maternal grandfather, maternal grandmother, paternal grandfather, and paternal grandmother; Hypertension in his father, maternal grandfather, and maternal grandmother; Kidney disease in his father; Stroke in his father. There is no history of Colon cancer, Esophageal cancer, Rectal cancer, or Stomach cancer.   Allergies No Known Allergies   Home Medications  Prior to Admission medications   Medication Sig Start Date End Date Taking? Authorizing Provider  Accu-Chek Softclix Lancets lancets  08/28/23   [provider]  Blood Glucose Monitoring Suppl DEVI 1 each by Does not apply route in the morning, at noon, and at bedtime. May substitute to any manufacturer covered by patient's insurance. 02/06/23   [name], MD  clobetasol  ointment (TEMOVATE ) 0.05 % Apply 1 Application topically 2 (two) times daily. Patient  not taking: Reported on 06/09/2024 02/06/23   [name], MD  empagliflozin  (JARDIANCE ) 25 MG TABS tablet Take 1 tablet (25 mg total) by mouth daily before breakfast. 03/31/24   [name], MD  Glucose Blood (BLOOD GLUCOSE TEST STRIPS) STRP 1 each by In Vitro route daily. May substitute to any manufacturer covered by patient's insurance. Patient not taking: Reported on 06/23/2024 02/06/23   [name], MD  metFORMIN  (GLUCOPHAGE ) 500 MG tablet Take 2 tablets (1,000 mg total) by mouth 2 (two) times daily with a meal. 04/22/24   [name], MD  olmesartan  (BENICAR ) 5 MG tablet Take 1 tablet (5 mg total) by mouth daily. 03/31/24   [name], MD  rosuvastatin  (CRESTOR ) 5 MG tablet Take 1 tablet (5 mg total) by mouth daily. 03/31/24   [name], MD  Secukinumab  (COSENTYX  UNOREADY) 300 MG/2ML SOAJ Inject 300 mg into the skin every 28 (twenty-eight) days. 05/25/24   Rice, Lonni ORN, MD  triamcinolone  cream (KENALOG ) 0.1 % Apply 1 Application topically 2 (two) times daily as needed. Patient not taking: Reported on 06/23/2024 06/01/24   Jeannetta Lonni ORN, MD     Critical care time: 35 minutes      CRITICAL CARE Performed by: Leita SAUNDERS Solimar Maiden   Total critical care time: 35 minutes  Critical care time was exclusive of separately billable procedures and treating other patients.  Critical care was necessary to treat or prevent imminent or life-threatening deterioration.  Critical care was time spent personally by me on the following activities: development of treatment plan with patient and/or surrogate as well as nursing, discussions with consultants, evaluation of patient's response to treatment, examination of patient, obtaining history from patient or surrogate, ordering and performing treatments and interventions, ordering and review of laboratory studies, ordering and review of radiographic studies, pulse oximetry and re-evaluation of patient's  condition.  Leita SAUNDERS Dayana Dalporto, PA-C [facility] See Amion for pager If no response to pager , please call [phone] until 7pm After 7:00 pm call Elink  [phone]

## 2024-08-08 NOTE — Evaluation (Signed)
 Physical Therapy Evaluation Patient Details Name: Jeffrey Black. MRN: [id number] DOB: 11/20/1960 Today's Date: 08/08/2024  History of Present Illness  Pt is 63 year old presented to [facility] on  08/07/24 for chest pain and syncopal episodes. Pt hypotensive and started on pressors. Pt with acute hypoxemic respiratory failure secondary to URI and septic shock. Chest pain was pleuritic and CT showed pneumomediastinum. PMH - DM, psoriasis, etoh use in remission  Clinical Impression  Pt admitted with above diagnosis and presents to PT with functional limitations due to deficits listed below (See PT problem list). Pt needs skilled PT to maximize independence and safety. Pt lives alone and was independent without assistive device. Pt with multiple syncopal episodes/fall in the week prior to admission but none prior to that. Currently weak and unsteady. Pt doesn't think anyone can provide initial assist on dc home. Patient will benefit from intensive inpatient follow-up therapy, >3 hours/day. Feel with this he can return home at modified independent level. He also may progress very rapidly as medical status improves and not need that setting.           If plan is discharge home, recommend the following: A little help with walking and/or transfers;A little help with bathing/dressing/bathroom;Help with stairs or ramp for entrance;Assist for transportation;Assistance with cooking/housework   Can travel by private vehicle        Equipment Recommendations None recommended by PT  Recommendations for Other Services       Functional Status Assessment Patient has had a recent decline in their functional status and demonstrates the ability to make significant improvements in function in a reasonable and predictable amount of time.     Precautions / Restrictions Precautions Precautions: Fall;Other (comment) Precaution/Restrictions Comments: watch HR Restrictions Weight Bearing Restrictions Per Provider  Order: No      Mobility  Bed Mobility Overal bed mobility: Needs Assistance Bed Mobility: Supine to Sit     Supine to sit: Min assist, HOB elevated     General bed mobility comments: Assist to elevate trunk into sitting    Transfers Overall transfer level: Needs assistance Equipment used: Rolling walker (2 wheels) Transfers: Sit to/from Stand, Bed to chair/wheelchair/BSC Sit to Stand: Min assist   Step pivot transfers: Min assist       General transfer comment: Assist for balance and lines    Ambulation/Gait Ambulation/Gait assistance: Contact guard assist Gait Distance (Feet): 20 Feet Assistive device: Rolling walker (2 wheels) Gait Pattern/deviations: Step-through pattern, Decreased stride length Gait velocity: decr Gait velocity interpretation: <1.31 ft/sec, indicative of household ambulator   General Gait Details: Assist for safety and lines. Pt tremulous.  Stairs            Wheelchair Mobility     Tilt Bed    Modified Rankin (Stroke Patients Only)       Balance Overall balance assessment: Needs assistance Sitting-balance support: No upper extremity supported, Feet supported Sitting balance-Leahy Scale: Good     Standing balance support: Single extremity supported, During functional activity Standing balance-Leahy Scale: Poor Standing balance comment: UE support                             Pertinent Vitals/Pain Pain Assessment Pain Assessment: No/denies pain    Home Living Family/patient expects to be discharged to:: Private residence Living Arrangements: Alone Available Help at Discharge: Family;Available PRN/intermittently Type of Home: House Home Access: Ramped entrance       Home  Layout: Two level;Able to live on main level with bedroom/bathroom Home Equipment: Rolling Walker (2 wheels);Rollator (4 wheels) Additional Comments: Equipment was parents    Prior Function Prior Level of Function : Independent/Modified  Independent             Mobility Comments: No assistive device. Does not drive.       Extremity/Trunk Assessment   Upper Extremity Assessment Upper Extremity Assessment: Defer to OT evaluation    Lower Extremity Assessment Lower Extremity Assessment: Generalized weakness       Communication   Communication Communication: No apparent difficulties    Cognition Arousal: Alert Behavior During Therapy: Anxious, WFL for tasks assessed/performed   PT - Cognitive impairments: No apparent impairments                         Following commands: Impaired Following commands impaired: Follows multi-step commands with increased time     Cueing Cueing Techniques: Verbal cues, Tactile cues     General Comments General comments (skin integrity, edema, etc.): HR to 130's with activity. Removed O2 for amb.  SpO2 92% amb on RA    Exercises     Assessment/Plan    PT Assessment Patient needs continued PT services  PT Problem List Decreased strength;Decreased activity tolerance;Decreased balance;Decreased mobility       PT Treatment Interventions DME instruction;Gait training;Stair training;Functional mobility training;Therapeutic activities;Therapeutic exercise;Balance training;Patient/family education    PT Goals (Current goals can be found in the Care Plan section)  Acute Rehab PT Goals Patient Stated Goal: return home PT Goal Formulation: With patient Time For Goal Achievement: 08/22/24 Potential to Achieve Goals: Good    Frequency Min 2X/week     Co-evaluation               AM-PAC PT 6 Clicks Mobility  Outcome Measure Help needed turning from your back to your side while in a flat bed without using bedrails?: A Little Help needed moving from lying on your back to sitting on the side of a flat bed without using bedrails?: A Little Help needed moving to and from a bed to a chair (including a wheelchair)?: A Little Help needed standing up from a  chair using your arms (e.g., wheelchair or bedside chair)?: A Little Help needed to walk in hospital room?: A Little Help needed climbing 3-5 steps with a railing? : A Lot 6 Click Score: 17    End of Session Equipment Utilized During Treatment: Gait belt;Oxygen Activity Tolerance: Patient tolerated treatment well Patient left: in chair;with call bell/phone within reach;with chair alarm set Nurse Communication: Mobility status PT Visit Diagnosis: Unsteadiness on feet (R26.81);Muscle weakness (generalized) (M62.81);Other abnormalities of gait and mobility (R26.89)    Time: 1725-1750 PT Time Calculation (min) (ACUTE ONLY): 25 min   Charges:   PT Evaluation $PT Eval Moderate Complexity: 1 Mod PT Treatments $Gait Training: 8-22 mins PT General Charges $$ ACUTE PT VISIT: 1 Visit         [facility] PT Acute Rehabilitation Services Office [phone]   Rodgers ORN [facility] 08/08/2024, 6:26 PM

## 2024-08-08 NOTE — TOC CM/SW Note (Addendum)
 Transition of Care [facility]) - Inpatient Brief Assessment   Patient Details  Name: Jeffrey Black. MRN: [id number] Date of Birth: 06-18-1961  Transition of Care [facility]) CM/SW Contact:    Tom-Johnson, Harvest Muskrat, RN Phone Number: 08/08/2024, 3:36 PM   Clinical Narrative:  Patient presented to the ED with Chest pains, chills, Fever, Night Sweats Nausea and Headache. Found to have elevated Troponin, Hypotensive requiring IV Fluids and Levo.   CTA C/A/P showed small Pneumomediastinum, GGO in the LLL. Cardiology following. Patient has hx of ETOH, Hepatic Steatosis, Psoriasis. Currently on 2L O2 acute, Heparin  gtt, IV abx completed Albumin .  CM spoke with patient at bedside about needs for post hospital transition. Patient states he lives alone, has one daughter and a supportive sister. On early retirement, does not drive. Sister transports to and from appointments. Has a cane, rollator and grab bars at home.  PCP is [name], MD and uses [facility].   Patient not Medically ready for discharge.  CM will continue to follow as patient progresses with care towards discharge.          Transition of Care Asessment: Insurance and Status: Insurance coverage has been reviewed Patient has primary care physician: Yes Home environment has been reviewed: Yes Prior level of function:: Modified Independent Prior/Current Home Services: No current home services Social Drivers of Health Review: SDOH reviewed no interventions necessary Readmission risk has been reviewed: Yes Transition of care needs: transition of care needs identified, TOC will continue to follow

## 2024-08-08 NOTE — Progress Notes (Signed)
 PHARMACY - ANTICOAGULATION CONSULT NOTE  Pharmacy Consult for heparin  Indication: chest pain/ACS  No Known Allergies  Patient Measurements: Height: 5' 11.5 (181.6 cm) Weight: 97.3 kg (214 lb 8.1 oz) IBW/kg (Calculated) : 76.44 HEPARIN  DW (KG): 96.1  Vital Signs: Temp: 98.3 F (36.8 C) (09/22 1921) Temp Source: Oral (09/22 1921) BP: 103/85 (09/22 1830) Pulse Rate: 119 (09/22 1830)  Labs: Recent Labs    08/07/24 1529 08/07/24 1533 08/07/24 1740 08/08/24 0203 08/08/24 1022 08/08/24 1730  HGB 11.6* 11.6*  11.6*  --  11.2*  --   --   HCT 34.9* 34.0*  34.0*  --  34.0*  --   --   PLT 196  --   --  193  --   --   LABPROT 17.5*  --   --   --   --   --   INR 1.4*  --   --   --   --   --   HEPARINUNFRC  --   --   --  <0.10* <0.10* <0.10*  CREATININE 1.21 1.10  --  1.10  --   --   TROPONINIHS 1,350*  --  1,336*  --  2,114* 1,143*    Estimated Creatinine Clearance: 82.4 mL/min (by C-G formula based on SCr of 1.1 mg/dL).   Assessment: 14 YOM presenting with shock, pericardial pain, now concern for ACS, he is not on anticoagulation PTA. Troponins elevated.  HL <0.10 - undetectable + subtherapeutic No issues with line or infusion per RN. No issues with line or bleeding reported per RN.  Goal of Therapy:  Heparin  level 0.3-0.7 units/ml Monitor platelets by anticoagulation protocol: Yes   Plan:  Bolus heparin  3000 units Increase heparin  infusion to 2150 units/hr F/u 6 hour heparin  level  Vito Ralph, PharmD, BCPS Please see amion for complete clinical pharmacist phone list 08/08/2024 7:36 PM

## 2024-08-08 NOTE — Progress Notes (Signed)
 Inpatient Rehab Admissions Coordinator Note:   Per PT recommendations patient was screened for CIR candidacy by Reche FORBES Lowers, PT. At this time, pt appears to be a potential candidate for CIR. I will place an order for rehab consult for full assessment, per our protocol.  Please contact me any with questions.SABRA Reche Lowers, PT, DPT [phone] 08/08/24 8:15 PM

## 2024-08-09 ENCOUNTER — Inpatient Hospital Stay (HOSPITAL_COMMUNITY)

## 2024-08-09 DIAGNOSIS — J982 Interstitial emphysema: Secondary | ICD-10-CM

## 2024-08-09 DIAGNOSIS — I3139 Other pericardial effusion (noninflammatory): Secondary | ICD-10-CM

## 2024-08-09 DIAGNOSIS — R079 Chest pain, unspecified: Secondary | ICD-10-CM | POA: Diagnosis not present

## 2024-08-09 DIAGNOSIS — R7989 Other specified abnormal findings of blood chemistry: Secondary | ICD-10-CM

## 2024-08-09 DIAGNOSIS — I4891 Unspecified atrial fibrillation: Secondary | ICD-10-CM

## 2024-08-09 DIAGNOSIS — I514 Myocarditis, unspecified: Secondary | ICD-10-CM

## 2024-08-09 DIAGNOSIS — R579 Shock, unspecified: Secondary | ICD-10-CM | POA: Diagnosis not present

## 2024-08-09 LAB — BASIC METABOLIC PANEL WITH GFR
Anion gap: 11 (ref 5–15)
BUN: 21 mg/dL (ref 8–23)
CO2: 19 mmol/L — ABNORMAL LOW (ref 22–32)
Calcium: 8.2 mg/dL — ABNORMAL LOW (ref 8.9–10.3)
Chloride: 103 mmol/L (ref 98–111)
Creatinine, Ser: 0.97 mg/dL (ref 0.61–1.24)
GFR, Estimated: >60 mL/min (ref >60)
Glucose, Bld: 114 mg/dL — ABNORMAL HIGH (ref 70–99)
Potassium: 4.3 mmol/L (ref 3.5–5.1)
Sodium: 133 mmol/L — ABNORMAL LOW (ref 135–145)

## 2024-08-09 LAB — TROPONIN I (HIGH SENSITIVITY): Troponin I (High Sensitivity): 1076 ng/L (ref <18)

## 2024-08-09 LAB — CBC
HCT: 33.2 % — ABNORMAL LOW (ref 39.0–52.0)
Hemoglobin: 10.9 g/dL — ABNORMAL LOW (ref 13.0–17.0)
MCH: 28.5 pg (ref 26.0–34.0)
MCHC: 32.8 g/dL (ref 30.0–36.0)
MCV: 86.7 fL (ref 80.0–100.0)
Platelets: 227 K/uL (ref 150–400)
RBC: 3.83 MIL/uL — ABNORMAL LOW (ref 4.22–5.81)
RDW: 14.5 % (ref 11.5–15.5)
WBC: 13.8 K/uL — ABNORMAL HIGH (ref 4.0–10.5)
nRBC: 0 % (ref 0.0–0.2)

## 2024-08-09 LAB — TSH: TSH: 0.713 u[IU]/mL (ref 0.350–4.500)

## 2024-08-09 LAB — ECHOCARDIOGRAM LIMITED
Height: 71.496 in
S' Lateral: 2.8 cm
Weight: 3365.1 [oz_av]

## 2024-08-09 LAB — LACTIC ACID, PLASMA
Lactic Acid, Venous: 2.3 mmol/L (ref 0.5–1.9)
Lactic Acid, Venous: 3.1 mmol/L (ref 0.5–1.9)

## 2024-08-09 LAB — BRAIN NATRIURETIC PEPTIDE: B Natriuretic Peptide: 436.5 pg/mL — ABNORMAL HIGH (ref 0.0–100.0)

## 2024-08-09 LAB — GLUCOSE, CAPILLARY
Glucose-Capillary: 112 mg/dL — ABNORMAL HIGH (ref 70–99)
Glucose-Capillary: 112 mg/dL — ABNORMAL HIGH (ref 70–99)
Glucose-Capillary: 121 mg/dL — ABNORMAL HIGH (ref 70–99)
Glucose-Capillary: 172 mg/dL — ABNORMAL HIGH (ref 70–99)
Glucose-Capillary: 130 mg/dL — ABNORMAL HIGH (ref 70–99)
Glucose-Capillary: 147 mg/dL — ABNORMAL HIGH (ref 70–99)

## 2024-08-09 LAB — PHOSPHORUS: Phosphorus: 2.6 mg/dL (ref 2.5–4.6)

## 2024-08-09 LAB — MAGNESIUM: Magnesium: 2.4 mg/dL (ref 1.7–2.4)

## 2024-08-09 LAB — SEDIMENTATION RATE: Sed Rate: 43 mm/h — ABNORMAL HIGH (ref 0–16)

## 2024-08-09 LAB — HEPARIN LEVEL (UNFRACTIONATED)
Heparin Unfractionated: 0.17 [IU]/mL — ABNORMAL LOW (ref 0.30–0.70)
Heparin Unfractionated: 0.3 [IU]/mL (ref 0.30–0.70)

## 2024-08-09 LAB — C-REACTIVE PROTEIN: CRP: 26.3 mg/dL — ABNORMAL HIGH (ref <1.0)

## 2024-08-09 MED ORDER — MELATONIN 3 MG PO TABS
3.0000 mg | ORAL_TABLET | Freq: Once | ORAL | Status: AC
  Administered 2024-08-09: 3 mg via ORAL
  Filled 2024-08-09: qty 1

## 2024-08-09 MED ORDER — ALBUTEROL SULFATE (2.5 MG/3ML) 0.083% IN NEBU
2.5000 mg | INHALATION_SOLUTION | Freq: Four times a day (QID) | RESPIRATORY_TRACT | Status: DC
  Administered 2024-08-09 – 2024-08-10 (×2): 2.5 mg via RESPIRATORY_TRACT
  Filled 2024-08-09 (×3): qty 3

## 2024-08-09 MED ORDER — AMIODARONE HCL IN DEXTROSE 360-4.14 MG/200ML-% IV SOLN
30.0000 mg/h | INTRAVENOUS | Status: DC
  Administered 2024-08-09 – 2024-08-11 (×4): 30 mg/h via INTRAVENOUS
  Filled 2024-08-09 (×3): qty 200

## 2024-08-09 MED ORDER — AMIODARONE LOAD VIA INFUSION
150.0000 mg | Freq: Once | INTRAVENOUS | Status: AC
  Administered 2024-08-09: 150 mg via INTRAVENOUS
  Filled 2024-08-09: qty 83.34

## 2024-08-09 MED ORDER — LACTATED RINGERS IV BOLUS
500.0000 mL | Freq: Once | INTRAVENOUS | Status: AC
  Administered 2024-08-09: 500 mL via INTRAVENOUS

## 2024-08-09 MED ORDER — GADOBUTROL 1 MMOL/ML IV SOLN
10.0000 mL | Freq: Once | INTRAVENOUS | Status: AC | PRN
  Administered 2024-08-09: 10 mL via INTRAVENOUS

## 2024-08-09 MED ORDER — CARMEX CLASSIC LIP BALM EX OINT
TOPICAL_OINTMENT | CUTANEOUS | Status: DC | PRN
  Filled 2024-08-09: qty 10

## 2024-08-09 MED ORDER — HEPARIN BOLUS VIA INFUSION
2800.0000 [IU] | Freq: Once | INTRAVENOUS | Status: AC
  Administered 2024-08-09: 2800 [IU] via INTRAVENOUS
  Filled 2024-08-09: qty 2800

## 2024-08-09 MED ORDER — AMIODARONE HCL IN DEXTROSE 360-4.14 MG/200ML-% IV SOLN
60.0000 mg/h | INTRAVENOUS | Status: AC
  Administered 2024-08-09 (×2): 60 mg/h via INTRAVENOUS
  Filled 2024-08-09 (×2): qty 200

## 2024-08-09 MED ORDER — LACTATED RINGERS IV BOLUS
500.0000 mL | Freq: Once | INTRAVENOUS | Status: AC
  Administered 2024-08-10: 500 mL via INTRAVENOUS

## 2024-08-09 NOTE — Progress Notes (Addendum)
 Progress Note  Patient Name: Jeffrey Black. Date of Encounter: 08/09/2024  [facility] Cardiologist: None   Patient Profile     Subjective   Denies chest pain unless he is sitting up. hsTrop peaked at 2114 and trending down to 1143 today.   2D echo showed EF 55 to 60% with no focal wall motion abnormalities and G1 DD as well as small to moderate pericardial effusion  Off pressors this am with soft BP.  Going in and out of afib with RVR  Inpatient Medications    Scheduled Meds:  Chlorhexidine  Gluconate Cloth  6 each Topical Daily   insulin  aspart  0-9 Units Subcutaneous Q4H   nicotine   14 mg Transdermal Daily   pantoprazole  (PROTONIX ) IV  40 mg Intravenous QHS   Continuous Infusions:  heparin  2,400 Units/hr (08/09/24 0644)   norepinephrine  (LEVOPHED ) Adult infusion Stopped (08/08/24 1444)   piperacillin -tazobactam (ZOSYN )  IV 12.5 mL/hr at 08/09/24 0600   PRN Meds: docusate sodium , HYDROmorphone  (DILAUDID ) injection, ipratropium-albuterol , mouth rinse, polyethylene glycol   Vital Signs    Vitals:   08/09/24 0730 08/09/24 0802 08/09/24 0825 08/09/24 0826  BP: (!) 129/91 (!) 140/94    Pulse: (!) 127 (!) 127    Resp: (!) 27 (!) 30    Temp:    99 F (37.2 C)  TempSrc:    Oral  SpO2: (!) 84% 95% 96%   Weight:      Height:        Intake/Output Summary (Last 24 hours) at 08/09/2024 0849 Last data filed at 08/09/2024 0748 Gross per 24 hour  Intake 1724.13 ml  Output 1860 ml  Net -135.87 ml      08/09/2024    4:22 AM 08/08/2024   11:30 AM 08/08/2024    4:53 AM  Last 3 Weights  Weight (lbs) 210 lb 5.1 oz 214 lb 8.1 oz 214 lb 8.1 oz  Weight (kg) 95.4 kg 97.3 kg 97.3 kg      Telemetry    Normal sinus rhythm, PACs and paroxysmal atrial fibrillation with RVR- Personally Reviewed  ECG    No new EKG to review- Personally Reviewed  Physical Exam   GEN: Well nourished, well developed in no acute distress HEENT: Normal NECK: No JVD; No carotid  bruits LYMPHATICS: No lymphadenopathy CARDIAC:irregularly irregular and tachy, no murmurs, rubs, gallops RESPIRATORY:  Clear to auscultation without rales, wheezing or rhonchi  ABDOMEN: Soft, non-tender, non-distended MUSCULOSKELETAL:  No edema; No deformity  SKIN: Warm and dry NEUROLOGIC:  Alert and oriented x 3 PSYCHIATRIC:  Normal affect  Labs    High Sensitivity Troponin:   Recent Labs  Lab 08/07/24 1529 08/07/24 1740 08/08/24 1022 08/08/24 1730  TROPONINIHS 1,350* 1,336* 2,114* 1,143*      Chemistry Recent Labs  Lab 08/07/24 1529 08/07/24 1533 08/08/24 0203 08/09/24 0416  NA 130* 129*  129* 131* 133*  K 4.3 4.3  4.3 4.3 4.3  CL 99 97* 103 103  CO2 19*  --  16* 19*  GLUCOSE 163* 165* 114* 114*  BUN 14 15 17 21   CREATININE 1.21 1.10 1.10 0.97  CALCIUM  8.7*  --  8.4* 8.2*  PROT 5.7*  --   --   --   ALBUMIN  2.9*  --   --   --   AST 27  --   --   --   ALT 40  --   --   --   ALKPHOS 40  --   --   --  BILITOT 0.5  --   --   --   GFRNONAA >60  --  >60 >60  ANIONGAP 12  --  12 11     Hematology Recent Labs  Lab 08/07/24 1529 08/07/24 1533 08/08/24 0203 08/09/24 0416  WBC 17.7*  --  15.6* 13.8*  RBC 4.02*  --  3.96* 3.83*  HGB 11.6* 11.6*  11.6* 11.2* 10.9*  HCT 34.9* 34.0*  34.0* 34.0* 33.2*  MCV 86.8  --  85.9 86.7  MCH 28.9  --  28.3 28.5  MCHC 33.2  --  32.9 32.8  RDW 13.6  --  13.8 14.5  PLT 196  --  193 227    BNP Recent Labs  Lab 08/07/24 1529  BNP 190.5*     DDimer No results for input(s): DDIMER in the last 168 hours.   Radiology    DG Chest Port 1 View Result Date: 08/09/2024 CLINICAL DATA:  10028 Wheezing 89971. EXAM: PORTABLE CHEST 1 VIEW COMPARISON:  08/08/2024. FINDINGS: Mild coarse interstitial markings are grossly similar to the prior study. Bilateral lung fields are clear. No acute consolidation, lung collapse or pulmonary edema. Bilateral costophrenic angles are clear. Redemonstration of elevated right hemidiaphragm.  Stable cardio-mediastinal silhouette. No acute osseous abnormalities. The soft tissues are within normal limits. IMPRESSION: No active disease. Electronically Signed   By: Ree Molt M.D.   On: 08/09/2024 08:15   VAS US  LOWER EXTREMITY VENOUS (DVT) Result Date: 08/08/2024  Lower Venous DVT Study Patient Name:  Jeffrey Black.  Date of Exam:   08/08/2024 Medical Rec #: [id number]             Accession #:    [id number] Date of Birth: December 15, 1960              Patient Gender: M Patient Age:   32 years Exam Location:  [facility] Procedure:      VAS US  LOWER EXTREMITY VENOUS (DVT) Referring Phys: LEITA EINSTEIN --------------------------------------------------------------------------------  Indications: Acute hypoxic respiratory failure.  Comparison Study: No previous exams Performing Technologist: Jody Hill RVT, RDMS  Examination Guidelines: A complete evaluation includes B-mode imaging, spectral Doppler, color Doppler, and power Doppler as needed of all accessible portions of each vessel. Bilateral testing is considered an integral part of a complete examination. Limited examinations for reoccurring indications may be performed as noted. The reflux portion of the exam is performed with the patient in reverse Trendelenburg.  +---------+---------------+---------+-----------+----------+--------------+ RIGHT    CompressibilityPhasicitySpontaneityPropertiesThrombus Aging +---------+---------------+---------+-----------+----------+--------------+ CFV      Full           Yes      Yes                                 +---------+---------------+---------+-----------+----------+--------------+ SFJ      Full                                                        +---------+---------------+---------+-----------+----------+--------------+ FV Prox  Full           Yes      Yes                                 +---------+---------------+---------+-----------+----------+--------------+  FV Mid    Full           Yes      Yes                                 +---------+---------------+---------+-----------+----------+--------------+ FV DistalFull           Yes      Yes                                 +---------+---------------+---------+-----------+----------+--------------+ PFV      Full                                                        +---------+---------------+---------+-----------+----------+--------------+ POP      Full           Yes      Yes                                 +---------+---------------+---------+-----------+----------+--------------+ PTV      Full                                                        +---------+---------------+---------+-----------+----------+--------------+ PERO     Full                                                        +---------+---------------+---------+-----------+----------+--------------+   +---------+---------------+---------+-----------+-------------+--------------+ LEFT     CompressibilityPhasicitySpontaneityProperties   Thrombus Aging +---------+---------------+---------+-----------+-------------+--------------+ CFV      Full           Yes      Yes                                    +---------+---------------+---------+-----------+-------------+--------------+ SFJ      Full                               rouleaux flow               +---------+---------------+---------+-----------+-------------+--------------+ FV Prox  Full           Yes      Yes                                    +---------+---------------+---------+-----------+-------------+--------------+ FV Mid   Full           Yes      Yes                                    +---------+---------------+---------+-----------+-------------+--------------+ FV DistalFull  Yes      Yes                                    +---------+---------------+---------+-----------+-------------+--------------+ PFV      Full                                                            +---------+---------------+---------+-----------+-------------+--------------+ POP      Full           Yes      Yes                                    +---------+---------------+---------+-----------+-------------+--------------+ PTV      Full                                                           +---------+---------------+---------+-----------+-------------+--------------+ PERO     Full                                                           +---------+---------------+---------+-----------+-------------+--------------+     Summary: BILATERAL: - No evidence of deep vein thrombosis seen in the lower extremities, bilaterally. -No evidence of popliteal cyst, bilaterally.   *See table(s) above for measurements and observations. Electronically signed by Debby Robertson on 08/08/2024 at 4:47:20 PM.    Final    ECHOCARDIOGRAM COMPLETE Result Date: 08/08/2024    ECHOCARDIOGRAM REPORT   Patient Name:   Jeffrey Black. Date of Exam: 08/08/2024 Medical Rec #:  [id number]            Height:       71.5 in Accession #:    [id number]           Weight:       214.5 lb Date of Birth:  01/27/1961             BSA:          2.184 m Patient Age:    63 years             BP:           85/58 mmHg Patient Gender: M                    HR:           104 bpm. Exam Location:  Inpatient Procedure: 2D Echo, Cardiac Doppler and Color Doppler (Both Spectral and Color            Flow Doppler were utilized during procedure). Indications:    R07.9* Chest pain, unspecified  History:        Patient has prior history of Echocardiogram examinations, most                 recent 07/24/2013. Previous Myocardial  Infarction; Risk                 Factors:Hypertension and Diabetes. ETOH. Shock.  Sonographer:    Ellouise Mose RDCS Referring Phys: [id number] LAURA R GLEASON IMPRESSIONS  1. Left ventricular ejection fraction, by estimation, is 55 to 60%. The left ventricle has normal  function. The left ventricle has no regional wall motion abnormalities. There is moderate concentric left ventricular hypertrophy. Left ventricular diastolic parameters are consistent with Grade I diastolic dysfunction (impaired relaxation).  2. Right ventricular systolic function is mildly reduced. The right ventricular size is normal. Tricuspid regurgitation signal is inadequate for assessing PA pressure.  3. The mitral valve is normal in structure. Trivial mitral valve regurgitation. No evidence of mitral stenosis.  4. The aortic valve is tricuspid. There is moderate calcification of the aortic valve. Aortic valve regurgitation is not visualized. Aortic valve sclerosis/calcification is present, without any evidence of aortic stenosis.  5. Aortic dilatation noted. There is borderline dilatation of the aortic root, measuring 38 mm.  6. The inferior vena cava is dilated in size with <50% respiratory variability, suggesting right atrial pressure of 15 mmHg.  7. Small to moderate circumferential pericardial effusion. The IVC is dilated but there is no RV diastolic collapse. There is < 25% respirophasic variation of mitral inflow E wave. There does not appear to be pericardial tamponade. FINDINGS  Left Ventricle: Left ventricular ejection fraction, by estimation, is 55 to 60%. The left ventricle has normal function. The left ventricle has no regional wall motion abnormalities. The left ventricular internal cavity size was normal in size. There is  moderate concentric left ventricular hypertrophy. Left ventricular diastolic parameters are consistent with Grade I diastolic dysfunction (impaired relaxation). Right Ventricle: The right ventricular size is normal. No increase in right ventricular wall thickness. Right ventricular systolic function is mildly reduced. Tricuspid regurgitation signal is inadequate for assessing PA pressure. Left Atrium: Left atrial size was normal in size. Right Atrium: Right atrial size was  normal in size. Pericardium: Small to moderate circumferential pericardial effusion. The IVC is dilated but there is no RV diastolic collapse. There is < 25% respirophasic variation of mitral inflow E wave. There does not appear to be pericardial tamponade. Mitral Valve: The mitral valve is normal in structure. Trivial mitral valve regurgitation. No evidence of mitral valve stenosis. Tricuspid Valve: The tricuspid valve is normal in structure. Tricuspid valve regurgitation is not demonstrated. Aortic Valve: The aortic valve is tricuspid. There is moderate calcification of the aortic valve. Aortic valve regurgitation is not visualized. Aortic valve sclerosis/calcification is present, without any evidence of aortic stenosis. Pulmonic Valve: The pulmonic valve was normal in structure. Pulmonic valve regurgitation is trivial. Aorta: Aortic dilatation noted. There is borderline dilatation of the aortic root, measuring 38 mm. Venous: The inferior vena cava is dilated in size with less than 50% respiratory variability, suggesting right atrial pressure of 15 mmHg. IAS/Shunts: No atrial level shunt detected by color flow Doppler.  LEFT VENTRICLE PLAX 2D LVIDd:         3.90 cm     Diastology LVIDs:         3.10 cm     LV e' medial:    6.36 cm/s LV PW:         1.60 cm     LV E/e' medial:  11.4 LV IVS:        1.90 cm     LV e' lateral:   5.55 cm/s LVOT diam:  2.40 cm     LV E/e' lateral: 13.1 LV SV:         67 LV SV Index:   30 LVOT Area:     4.52 cm  LV Volumes (MOD) LV vol d, MOD A2C: 60.5 ml LV vol d, MOD A4C: 73.9 ml LV vol s, MOD A2C: 41.5 ml LV vol s, MOD A4C: 25.3 ml LV SV MOD A2C:     19.0 ml LV SV MOD A4C:     73.9 ml LV SV MOD BP:      30.7 ml RIGHT VENTRICLE             IVC RV S prime:     15.30 cm/s  IVC diam: 2.70 cm TAPSE (M-mode): 1.9 cm LEFT ATRIUM             Index        RIGHT ATRIUM           Index LA diam:        3.40 cm 1.56 cm/m   RA Area:     16.70 cm LA Vol (A2C):   36.0 ml 16.49 ml/m  RA Volume:    43.70 ml  20.01 ml/m LA Vol (A4C):   25.9 ml 11.86 ml/m LA Biplane Vol: 32.7 ml 14.98 ml/m  AORTIC VALVE LVOT Vmax:   91.70 cm/s LVOT Vmean:  61.600 cm/s LVOT VTI:    0.147 m  AORTA Ao Root diam: 3.80 cm Ao Asc diam:  3.90 cm MITRAL VALVE MV Area (PHT): 6.77 cm    SHUNTS MV Decel Time: 112 msec    Systemic VTI:  0.15 m MV E velocity: 72.70 cm/s  Systemic Diam: 2.40 cm MV A velocity: 83.66 cm/s MV E/A ratio:  0.87 Dalton McleanMD Electronically signed by Ezra Kanner Signature Date/Time: 08/08/2024/3:03:53 PM    Final    DG CHEST PORT 1 VIEW Result Date: 08/08/2024 CLINICAL DATA:  33352 Pneumomediastinum (HCC) 33352 EXAM: PORTABLE CHEST - 1 VIEW COMPARISON:  08/01/2013 FINDINGS: Patchy retrocardiac airspace opacities. No pleural effusion or pneumothorax. Mild cardiomegaly. Tortuous aorta with aortic atherosclerosis. No acute fracture or destructive lesions. Multilevel thoracic osteophytosis. IMPRESSION: 1. Patchy retrocardiac airspace opacities, likely reflecting atelectasis. 2. No pneumomediastinum. Electronically Signed   By: Rogelia Myers M.D.   On: 08/08/2024 09:16   US  EKG SITE RITE Result Date: 08/07/2024 If Site Rite image not attached, placement could not be confirmed due to current cardiac rhythm.  CT ANGIO HEAD NECK W WO CM Result Date: 08/07/2024 CLINICAL DATA:  Vertebral artery dissection suspected. Syncope. Chest pain. EXAM: CT ANGIOGRAPHY HEAD AND NECK WITH AND WITHOUT CONTRAST TECHNIQUE: Multidetector CT imaging of the head and neck was performed using the standard protocol during bolus administration of intravenous contrast. Multiplanar CT image reconstructions and MIPs were obtained to evaluate the vascular anatomy. Carotid stenosis measurements (when applicable) are obtained utilizing NASCET criteria, using the distal internal carotid diameter as the denominator. RADIATION DOSE REDUCTION: This exam was performed according to the departmental dose-optimization program which includes  automated exposure control, adjustment of the mA and/or kV according to patient size and/or use of iterative reconstruction technique. CONTRAST:  OMNIPAQUE  IOHEXOL  350 MG/ML SOLN COMPARISON:  Head CT 06/02/2015 FINDINGS: CT HEAD FINDINGS Brain: There is no evidence of an acute infarct, intracranial hemorrhage, mass, midline shift, or extra-axial fluid collection. Cerebral white matter hypodensities are nonspecific but compatible with mild chronic small vessel ischemic disease. There is mild cerebellar greater than cerebral atrophy. Vascular:  Calcified atherosclerosis at the skull base. Skull: No fracture or suspicious lesion. Sinuses/Orbits: Paranasal sinuses and mastoid air cells are clear. Unremarkable orbits. Other: None. Review of the MIP images confirms the above findings CTA NECK FINDINGS Aortic arch: Standard branching with mild atherosclerosis. Widely patent brachiocephalic and left subclavian arteries. The right subclavian artery appears patent proximally but is otherwise largely obscured by streak artifact from dense venous contrast. Right carotid system: The proximal common carotid artery is obscured by streak artifact, however the vessel is widely patent and normal in appearance more distally. The cervical ICA is patent with mild scattered plaque not resulting in significant stenosis. Left carotid system: Patent with a small amount of plaque at the carotid bifurcation and distal cervical ICA without evidence of a significant stenosis or dissection. Vertebral arteries: Patent and codominant. Dense venous contrast completely obscures the right V2 segment in the C4-5 region. There is no evidence of a flow limiting stenosis or dissection elsewhere. Skeleton: Moderate multilevel cervical disc degeneration and left C2-3 facet arthrosis. Other neck: No evidence of cervical lymphadenopathy or mass, with streak artifact limiting assessment of the lower neck/thoracic inlet. Venous gas scattered throughout  the neck and head, presumably iatrogenic. Upper chest: Clear lung apices. Review of the MIP images confirms the above findings CTA HEAD FINDINGS Anterior circulation: The internal carotid arteries are patent from skull base to carotid termini with mild atherosclerotic calcification not resulting in significant stenosis. ACAs and MCAs are patent without evidence of a proximal branch occlusion or significant proximal stenosis. No aneurysm is identified. Posterior circulation: The intracranial vertebral arteries are widely patent to the basilar with mild atheromatous irregularity involving the right greater than left V4 segments. The right PICA and left AICA appear dominant. Patent SCA origins are visualized bilaterally. The basilar artery is widely patent. There are large left and diminutive or absent right posterior communicating arteries with hypoplasia of the left P1 segment. Both PCAs are patent without evidence of a significant proximal stenosis. No aneurysm is identified. Venous sinuses: As permitted by contrast timing, patent. Anatomic variants: Fetal left PCA. Review of the MIP images confirms the above findings IMPRESSION: 1. No evidence of acute intracranial abnormality. 2. Mild atherosclerosis in the head and neck without a large vessel occlusion, significant stenosis, or dissection identified within above described limitations. Electronically Signed   By: Dasie Hamburg M.D.   On: 08/07/2024 16:27   CT Angio Chest/Abd/Pel for Dissection W and/or Wo Contrast Result Date: 08/07/2024 CLINICAL DATA:  Acute aortic syndrome suspected. EXAM: CT ANGIOGRAPHY CHEST, ABDOMEN AND PELVIS TECHNIQUE: Non-contrast CT of the chest was initially obtained. Multidetector CT imaging through the chest, abdomen and pelvis was performed using the standard protocol during bolus administration of intravenous contrast. Multiplanar reconstructed images and MIPs were obtained and reviewed to evaluate the vascular anatomy. RADIATION  DOSE REDUCTION: This exam was performed according to the departmental dose-optimization program which includes automated exposure control, adjustment of the mA and/or kV according to patient size and/or use of iterative reconstruction technique. CONTRAST:  OMNIPAQUE  IOHEXOL  350 MG/ML SOLN COMPARISON:  CT abdomen 02/25/2013 FINDINGS: CTA CHEST FINDINGS Cardiovascular: Preferential opacification of the thoracic aorta. No evidence of thoracic aortic aneurysm or dissection. Normal heart size. No pericardial effusion. Mediastinum/Nodes: There is a small hiatal hernia. The esophagus appears within normal limits. There are 2 hypodense left thyroid  nodules measuring up to 6 mm. There is a small amount of pneumomediastinum anteriorly. Enlarged subcarinal lymph nodes measure up to 17 mm short axis. Enlarged  right hilar lymph nodes measure up to 11 mm short axis. Enlarged precarinal lymph node measures 14 mm. There also prominent, but nonenlarged paratracheal and AP window lymph nodes. Lungs/Pleura: There is a trace left pleural effusion. There are minimal ground-glass opacities in the inferior left lower lobe. The lungs are otherwise clear. There is no pneumothorax. Musculoskeletal: There is a small amount of air seen within the anterior chest wall, possibly vascular in origin. No acute fractures are seen. Review of the MIP images confirms the above findings. CTA ABDOMEN AND PELVIS FINDINGS VASCULAR Aorta: Normal caliber aorta without aneurysm, dissection, vasculitis or significant stenosis. There is moderate calcified atherosclerotic disease throughout the aorta. Celiac: Patent without evidence of aneurysm, dissection, vasculitis or significant stenosis. SMA: Patent without evidence of aneurysm, dissection, vasculitis or significant stenosis. Renals: Both renal arteries are patent without evidence of aneurysm, dissection, vasculitis, fibromuscular dysplasia or significant stenosis. IMA: Patent without evidence of  aneurysm, dissection, vasculitis or significant stenosis. Inflow: Patent without evidence of aneurysm, dissection, vasculitis or significant stenosis. Mild calcified atherosclerotic disease present. Veins: No obvious venous abnormality within the limitations of this arterial phase study. Review of the MIP images confirms the above findings. NON-VASCULAR Hepatobiliary: No focal liver abnormality is seen. No gallstones, gallbladder wall thickening, or biliary dilatation. Pancreas: Unremarkable. No pancreatic ductal dilatation or surrounding inflammatory changes. Spleen: Normal in size without focal abnormality. Adrenals/Urinary Tract: There is mild nonspecific thickening of the bilateral adrenal glands. The bilateral kidneys appear within normal limits. The bladder is within normal limits. Stomach/Bowel: Stomach is within normal limits. Appendix appears normal. No evidence of bowel wall thickening, distention, or inflammatory changes. There is a large amount of stool throughout the colon. There is sigmoid colon diverticulosis. Lymphatic: There are enlarged periportal lymph nodes measuring up to 16 mm short axis. There are nonenlarged retroperitoneal, iliac chain and inguinal lymph nodes. Reproductive: Prostate is unremarkable. Other: There is a small fat containing right inguinal hernia. There is a small fat containing umbilical hernia. No free fluid. Musculoskeletal: No acute or significant osseous findings. Review of the MIP images confirms the above findings. IMPRESSION: 1. No evidence for aortic dissection or aneurysm. 2. Small amount of pneumomediastinum. 3. Trace left pleural effusion. 4. Minimal ground-glass opacities in the left lower lobe may be infectious/inflammatory. 5. Mediastinal and right hilar lymphadenopathy of uncertain etiology. 6. Periportal lymphadenopathy of uncertain etiology. 7. Sigmoid colon diverticulosis. 8. Subcentimeter incidental left thyroid  nodule. Not clinically significant; no  follow-up imaging recommended (ref: J Am Coll Radiol. 2015 Feb;12(2): 143-50). Aortic Atherosclerosis (ICD10-I70.0). Electronically Signed   By: Greig Pique M.D.   On: 08/07/2024 16:24    Patient Profile     63 y.o. male with a hx of psoriasis, hepatic steatosis, tobacco use, and EtOH use (quit 2016) who is being seen 08/07/2024 for the evaluation of chest pain and elevated troponin.   Assessment & Plan    Chest pain Elevated troponin Hypotension -Patient presented with hypotension and shock felt likely related to sepsis -Chest pain pleuritic and CT showed pneumomediastinum -Elevated high-sensitivity troponins (1350>> 1336>> 2114>> 1143) -No ST changes on EKG -2D echo: Normal LV function EF 55 to 60%, moderate LVH, G1 DD, mild RV dysfunction and small to moderate pericardial effusion - Differential diagnosis for elevated troponin includes type II NSTEMI from shock/hypotension with demand ischemia but given fever, chills and night sweats  and pericardial effusion could also be consistent with acute viral myopericarditis.  Unlikely to be ACS in the setting of normal LV  function - now having PAF with RVR - He does have cardiac risk factors include diabetes, hypertension, ongoing tobacco abuse and family history of heart disease - Continue IV heparin  drip for now - BP soft but likely related to afib with RVR - repeat stat limited echo to make sure pericardial effusion has not increased in size and if stable then get cardiac MRI with contrast to assess for myopericarditis today  Hypertension - Presented with hypotension in the setting of shock - BP improved with IV fluids SBP in the 90's  Paroxysmal atrial fibrillation -started going in and out of afib with RVR this am -currently in afib in the 120's -suspect this is contributing to his soft BP -will give 500cc saline bolus for soft BP -give Amio 150mg  bolus slowly followed by gtt at 60mg /hr -continue IV heparin  gtt for now  Performed  by: Wilbert Bihari, MD   Total critical care time: 45 minutes   Critical care time was exclusive of separately billable procedures and treating other patients.   Critical care was necessary to treat or prevent imminent or life-threatening deterioration.   Critical care was time spent personally by me (independent of APPs or residents) on the following activities: development of treatment plan with patient and/or surrogate as well as nursing, discussions with consultants, evaluation of patient's response to treatment, examination of patient, obtaining history from patient or surrogate, ordering and performing treatments and interventions, ordering and review of laboratory studies, ordering and review of radiographic studies, pulse oximetry and re-evaluation of patient's condition. For questions or updates, please contact [facility] Please consult [email or web address] for contact info under        Signed, Wilbert Bihari, MD  08/09/2024, 8:49 AM

## 2024-08-09 NOTE — Progress Notes (Signed)
  Echocardiogram 2D Echocardiogram has been performed.  Tinnie FORBES Gosling RDCS 08/09/2024, 11:28 AM

## 2024-08-09 NOTE — Progress Notes (Addendum)
 NAME:  Eutimio Gharibian., MRN:  [id number], DOB:  09/19/1961, LOS: 2 ADMISSION DATE:  08/07/2024, CONSULTATION DATE:  08/07/24 REFERRING MD:  EDP, CHIEF COMPLAINT:  CP   History of Present Illness:  63 year old man w/ hx of psoriasis, DM, prior EtOH abuse in remission p/w 2 days of chest pain.  Located sternal, worse with inspiration.  No clear inciting event except headaches.  + fevers/chills.  +syncope, recurrent.  Pain is sharp deep and radiates to back.  +orthostasis.  BP extremely low on admit, Received 1L LR started on pressors, and taken for CTA H/C/A/P showing pneumomediastinum, no dissection, no PE.  EKG no STEMI.   Pertinent  Medical History   Past Medical History:  Diagnosis Date   Depression    Diabetes (HCC)    ETOH abuse    History of bronchitis    Hypertension    Psoriasis    Substance abuse (HCC)    Last A1c 6.5%  Significant Hospital Events: Including procedures, antibiotic start and stop dates in addition to other pertinent events   9/21 admit 9/22 pressor requirement improving  9/23 off pressors, still on [place] but feeling more short of breath this AM with sinus tach  Interim History / Subjective:  No current CP, remains on norepi , afebrile, no culture growth, on 2L [place] down-trending Echo with pericardial effusion without tamponade   Objective     General:  well nourished, ill-appearing M, resting in bed in mild respiratory distress HEENT: MM pink/moist Neuro: alert and oriented without focal deficits  CV: s1s2 sinus tach, no m/r/g PULM:  mildly diminished in the bilateral bases with upper airway wheeze and tachypnea  GI: soft, bsx4 active  Extremities: warm/dry, no edema      Resolved problem list   Assessment and Plan   Shock Acute Hypoxic Respiratory Failure with Pneumomediastinum, mediastinal adenopathy Chest pain Picture unclear, possible viral myocarditis  Trop down-trending Echo with preserved EF, but small to moderate  pericardial effusion without tamponade  CXR without pneumomediastinum today -now off norepi, continue to maintain map >65 -continue supplemental [place] -prn duonebs -no culture growth, continue zosyn  -cardiology consulted  and suspect Type II NSTEMI vs viral myocarditis, did not think NSAIDS currently indicated -continue heparin  gtt -LE dopplers without EVT -cardiac MRI to assess possible myopericarditis     HA-  CT CTA head okay, nonfocal neuro  Hyponatremia-  suspect acute phase reactant -improved to 133, continue to trend    Labs   CBC: Recent Labs  Lab 08/07/24 1529 08/07/24 1533 08/08/24 0203 08/09/24 0416  WBC 17.7*  --  15.6* 13.8*  NEUTROABS 15.0*  --   --   --   HGB 11.6* 11.6*  11.6* 11.2* 10.9*  HCT 34.9* 34.0*  34.0* 34.0* 33.2*  MCV 86.8  --  85.9 86.7  PLT 196  --  193 227    Basic Metabolic Panel: Recent Labs  Lab 08/07/24 1529 08/07/24 1533 08/08/24 0203 08/09/24 0416  NA 130* 129*  129* 131* 133*  K 4.3 4.3  4.3 4.3 4.3  CL 99 97* 103 103  CO2 19*  --  16* 19*  GLUCOSE 163* 165* 114* 114*  BUN 14 15 17 21   CREATININE 1.21 1.10 1.10 0.97  CALCIUM  8.7*  --  8.4* 8.2*  MG  --   --  1.9 2.4  PHOS  --   --  3.2 2.6   GFR: Estimated Creatinine Clearance: 92.6 mL/min (by C-G  formula based on SCr of 0.97 mg/dL). Recent Labs  Lab 08/07/24 1529 08/07/24 1533 08/07/24 1740 08/08/24 0203 08/08/24 1023 08/09/24 0416  PROCALCITON  --   --  2.03  --   --   --   WBC 17.7*  --   --  15.6*  --  13.8*  LATICACIDVEN  --  2.4*  --   --  1.5  --     Liver Function Tests: Recent Labs  Lab 08/07/24 1529  AST 27  ALT 40  ALKPHOS 40  BILITOT 0.5  PROT 5.7*  ALBUMIN  2.9*   Recent Labs  Lab 08/07/24 1529  LIPASE 11   No results for input(s): AMMONIA in the last 168 hours.  ABG    Component Value Date/Time   HCO3 20.8 08/07/2024 1533   TCO2 20 (L) 08/07/2024 1533   TCO2 22 08/07/2024 1533   ACIDBASEDEF 5.0 (H) 08/07/2024 1533    O2SAT 74 08/07/2024 1533     Coagulation Profile: Recent Labs  Lab 08/07/24 1529  INR 1.4*    Cardiac Enzymes: No results for input(s): CKTOTAL, CKMB, CKMBINDEX, TROPONINI in the last 168 hours.  HbA1C: Hemoglobin A1C  Date/Time Value Ref Range Status  03/31/2024 03:18 PM 6.5 (A) 4.0 - 5.6 % Final  09/24/2023 03:08 PM 7.1 (A) 4.0 - 5.6 % Final   Hgb A1c MFr Bld  Date/Time Value Ref Range Status  02/24/2013 06:40 AM 6.1 (H) <5.7 % Final    Comment:    (NOTE)                                                                       According to the ADA Clinical Practice Recommendations for 2011, when HbA1c is used as a screening test:  >=6.5%   Diagnostic of Diabetes Mellitus           (if abnormal result is confirmed) 5.7-6.4%   Increased risk of developing Diabetes Mellitus References:Diagnosis and Classification of Diabetes Mellitus,Diabetes Care,2011,34(Suppl 1):S62-S69 and Standards of Medical Care in         Diabetes - 2011,Diabetes Care,2011,34 (Suppl 1):S11-S61.    CBG: Recent Labs  Lab 08/08/24 1100 08/08/24 1515 08/08/24 1920 08/08/24 2349 08/09/24 0331  GLUCAP 108* 133* 157* 119* 112*    Review of Systems:    Positive Symptoms in bold:  Constitutional fevers, chills, weight loss, fatigue, anorexia, malaise  Eyes decreased vision, double vision, eye irritation  Ears, Nose, Mouth, Throat sore throat, trouble swallowing, sinus congestion  Cardiovascular chest pain, paroxysmal nocturnal dyspnea, lower ext edema, palpitations   Respiratory SOB, cough, DOE, hemoptysis, wheezing  Gastrointestinal nausea, vomiting, diarrhea  Genitourinary burning with urination, trouble urinating  Musculoskeletal joint aches, joint swelling, back pain  Integumentary  rashes, skin lesions  Neurological focal weakness, focal numbness, trouble speaking, headaches  Psychiatric depression, anxiety, confusion  Endocrine polyuria, polydipsia, cold intolerance, heat  intolerance  Hematologic abnormal bruising, abnormal bleeding, unexplained nose bleeds  Allergic/Immunologic recurrent infections, hives, swollen lymph nodes     Past Medical History:  He,  has a past medical history of Depression, Diabetes (HCC), ETOH abuse, History of bronchitis, Hypertension, Psoriasis, and Substance abuse (HCC).   Surgical History:   Past Surgical History:  Procedure Laterality Date  ESOPHAGOGASTRODUODENOSCOPY N/A 05/03/2013   Procedure: ESOPHAGOGASTRODUODENOSCOPY (EGD);  Surgeon: [name], MD;  Location: [facility];  Service: Endoscopy;  Laterality: N/A;   left 4th finger surgery       Social History:   reports that he has been smoking cigarettes. He has a 34 pack-year smoking history. He has been exposed to tobacco smoke. He has never used smokeless tobacco. He reports that he does not currently use alcohol . He reports that he does not use drugs.   Family History:  His family history includes CVA in his father and maternal grandmother; Cancer - Colon in an other family member; Colon polyps in his mother; Depression in his paternal grandfather; Diabetes in his maternal grandfather, maternal grandmother, mother, and paternal grandfather; Diabetes type II in his mother; Heart disease in his father, maternal grandfather, maternal grandmother, paternal grandfather, and paternal grandmother; Hyperlipidemia in his father, maternal grandfather, maternal grandmother, paternal grandfather, and paternal grandmother; Hypertension in his father, maternal grandfather, and maternal grandmother; Kidney disease in his father; Stroke in his father. There is no history of Colon cancer, Esophageal cancer, Rectal cancer, or Stomach cancer.   Allergies No Known Allergies   Home Medications  Prior to Admission medications   Medication Sig Start Date End Date Taking? Authorizing Provider  Accu-Chek Softclix Lancets lancets  08/28/23   [provider]  Blood Glucose  Monitoring Suppl DEVI 1 each by Does not apply route in the morning, at noon, and at bedtime. May substitute to any manufacturer covered by patient's insurance. 02/06/23   [name], MD  clobetasol  ointment (TEMOVATE ) 0.05 % Apply 1 Application topically 2 (two) times daily. Patient not taking: Reported on 06/09/2024 02/06/23   [name], MD  empagliflozin  (JARDIANCE ) 25 MG TABS tablet Take 1 tablet (25 mg total) by mouth daily before breakfast. 03/31/24   [name], MD  Glucose Blood (BLOOD GLUCOSE TEST STRIPS) STRP 1 each by In Vitro route daily. May substitute to any manufacturer covered by patient's insurance. Patient not taking: Reported on 06/23/2024 02/06/23   [name], MD  metFORMIN  (GLUCOPHAGE ) 500 MG tablet Take 2 tablets (1,000 mg total) by mouth 2 (two) times daily with a meal. 04/22/24   [name], MD  olmesartan  (BENICAR ) 5 MG tablet Take 1 tablet (5 mg total) by mouth daily. 03/31/24   [name], MD  rosuvastatin  (CRESTOR ) 5 MG tablet Take 1 tablet (5 mg total) by mouth daily. 03/31/24   [name], MD  Secukinumab  (COSENTYX  UNOREADY) 300 MG/2ML SOAJ Inject 300 mg into the skin every 28 (twenty-eight) days. 05/25/24   Rice, Lonni ORN, MD  triamcinolone  cream (KENALOG ) 0.1 % Apply 1 Application topically 2 (two) times daily as needed. Patient not taking: Reported on 06/23/2024 06/01/24   Jeannetta Lonni ORN, MD     Critical care time: 32 minutes      CRITICAL CARE Performed by: Leita SAUNDERS Mihaela Fajardo   Total critical care time: 32 minutes  Critical care time was exclusive of separately billable procedures and treating other patients.  Critical care was necessary to treat or prevent imminent or life-threatening deterioration.  Critical care was time spent personally by me on the following activities: development of treatment plan with patient and/or surrogate as well as nursing, discussions with consultants, evaluation of patient's  response to treatment, examination of patient, obtaining history from patient or surrogate, ordering and performing treatments and interventions, ordering and review of laboratory studies, ordering and review of  radiographic studies, pulse oximetry and re-evaluation of patient's condition.  Leita SAUNDERS Katasha Riga, PA-C [facility] See Amion for pager If no response to pager , please call [phone] until 7pm After 7:00 pm call Elink  [phone]

## 2024-08-09 NOTE — Progress Notes (Signed)
 eLink Physician-Brief Progress Note Patient Name: Jeffrey Black. DOB: [birth year] MRN: [id number]   Date of Service  08/09/2024  HPI/Events of Note  Lactic acid up from 1.5 to 3.1, clinically patient appears dry per bedside RN, patient did receive a fluid bolus for suspected volume depletion earlier in the day.  eICU Interventions  Will order a 500 ml fluid bolus and repeat lactic acid in 4 hours.        Kyliee Ortego U Ciera Beckum 08/09/2024, 11:37 PM

## 2024-08-09 NOTE — Progress Notes (Signed)
 PHARMACY - ANTICOAGULATION CONSULT NOTE  Pharmacy Consult for heparin  Indication: chest pain/ACS  No Known Allergies  Patient Measurements: Height: 5' 11.5 (181.6 cm) Weight: 95.4 kg (210 lb 5.1 oz) IBW/kg (Calculated) : 76.44 HEPARIN  DW (KG): 96.1  Vital Signs: Temp: 98 F (36.7 C) (09/23 0328) Temp Source: Axillary (09/23 0328) BP: 96/81 (09/23 0400) Pulse Rate: 114 (09/23 0400)  Labs: Recent Labs    08/07/24 1529 08/07/24 1533 08/07/24 1533 08/07/24 1740 08/08/24 0203 08/08/24 1022 08/08/24 1730 08/09/24 0416  HGB 11.6* 11.6*  11.6*  --   --  11.2*  --   --  10.9*  HCT 34.9* 34.0*  34.0*  --   --  34.0*  --   --  33.2*  PLT 196  --   --   --  193  --   --  227  LABPROT 17.5*  --   --   --   --   --   --   --   INR 1.4*  --   --   --   --   --   --   --   HEPARINUNFRC  --   --    < >  --  <0.10* <0.10* <0.10* 0.17*  CREATININE 1.21 1.10  --   --  1.10  --   --   --   TROPONINIHS 1,350*  --   --  1,336*  --  2,114* 1,143*  --    < > = values in this interval not displayed.    Estimated Creatinine Clearance: 81.7 mL/min (by C-G formula based on SCr of 1.1 mg/dL).   Assessment: 39 YOM presenting with shock, pericardial pain, now concern for ACS, he is not on anticoagulation PTA. Troponins elevated.  HL 0.17 - subtherapeutic on 2150 units/hr No issues with line or infusion per RN. No issues with line or bleeding reported per RN. Hgb stable 10-11s, platelets are normal.  Goal of Therapy:  Heparin  level 0.3-0.7 units/ml Monitor platelets by anticoagulation protocol: Yes   Plan:  Bolus heparin  2800 units Increase heparin  infusion to 2400 units/hr F/u 6 hour heparin  level Thank you for involving pharmacy in this patient's care.  Delon Sax, PharmD, BCPS Clinical Pharmacist 08/09/2024 5:00 AM

## 2024-08-09 NOTE — Evaluation (Signed)
 Occupational Therapy Evaluation Patient Details Name: Jeffrey Black. MRN: [id number] DOB: December 30, 1960 Today's Date: 08/09/2024   History of Present Illness   Pt is 63 year old presented to [facility] on  08/07/24 for chest pain and syncopal episodes. Pt hypotensive and started on pressors. Pt with acute hypoxemic respiratory failure secondary to URI and septic shock. Chest pain was pleuritic and CT showed pneumomediastinum. PMH - DM, psoriasis, etoh use in remission     Clinical Impressions Jeffrey Black was evaluated s/p the above admission list. He is indep and lives alone at baseline. Upon evaluation the pt was limited by dyspnea, low BP, generalized weakness and poor activity tolerance. Overall he was CGA for bed mobility and simple transfers, he was eager to get OOB however limited to pivotal stepping due to DOE. Due to the deficits listed below the pt also needs up to mod A for LB ADLs and set up A for UB ADLs with cues fro PLB and pacing. Pt will benefit from continued acute OT services and intensive inpatient follow up therapy, >3 hours/day after discharge with intention to progress to mod I.      If plan is discharge home, recommend the following:   A little help with walking and/or transfers;A little help with bathing/dressing/bathroom;Assistance with cooking/housework;Assist for transportation;Help with stairs or ramp for entrance     Functional Status Assessment   Patient has had a recent decline in their functional status and demonstrates the ability to make significant improvements in function in a reasonable and predictable amount of time.     Equipment Recommendations   None recommended by OT     Recommendations for Other Services   Rehab consult     Precautions/Restrictions   Precautions Precautions: Fall;Other (comment) Precaution/Restrictions Comments: watch HR Restrictions Weight Bearing Restrictions Per Provider Order: No     Mobility Bed Mobility Overal  bed mobility: Needs Assistance Bed Mobility: Supine to Sit     Supine to sit: Min assist          Transfers Overall transfer level: Needs assistance Equipment used: Rolling walker (2 wheels) Transfers: Sit to/from Stand, Bed to chair/wheelchair/BSC Sit to Stand: Contact guard assist     Step pivot transfers: Contact guard assist     General transfer comment: cues for PLB during activity      Balance Overall balance assessment: Needs assistance Sitting-balance support: No upper extremity supported, Feet supported Sitting balance-Leahy Scale: Good     Standing balance support: Single extremity supported, During functional activity Standing balance-Leahy Scale: Poor                             ADL either performed or assessed with clinical judgement   ADL Overall ADL's : Needs assistance/impaired Eating/Feeding: Set up;Sitting Eating/Feeding Details (indicate cue type and reason): having trouble with packages Grooming: Set up;Sitting   Upper Body Bathing: Set up;Sitting   Lower Body Bathing: Moderate assistance;Sit to/from stand   Upper Body Dressing : Set up;Sitting   Lower Body Dressing: Moderate assistance;Sit to/from stand   Toilet Transfer: Contact guard assist;Rolling walker (2 wheels)   Toileting- Clothing Manipulation and Hygiene: Contact guard assist;Sitting/lateral lean       Functional mobility during ADLs: Contact guard assist;Rolling walker (2 wheels) General ADL Comments: limited by SOB/DOE, generalized weakness and limited activity tolerance     Vision Baseline Vision/History: 0 No visual deficits Vision Assessment?: No apparent visual deficits;Wears glasses for reading  Perception Perception: Not tested       Praxis Praxis: Not tested       Pertinent Vitals/Pain Pain Assessment Pain Assessment: No/denies pain     Extremity/Trunk Assessment Upper Extremity Assessment Upper Extremity Assessment: Generalized  weakness   Lower Extremity Assessment Lower Extremity Assessment: Defer to PT evaluation   Cervical / Trunk Assessment Cervical / Trunk Assessment: Normal   Communication Communication Communication: No apparent difficulties   Cognition Arousal: Alert Behavior During Therapy: Anxious, WFL for tasks assessed/performed Cognition: No apparent impairments           Following commands: Impaired Following commands impaired: Follows multi-step commands inconsistently, Only follows one step commands consistently     Cueing  General Comments   Cueing Techniques: Verbal cues  BP low, elevated a littel with activty and returned once seated. SpO2 >90% on 1L,           Home Living Family/patient expects to be discharged to:: Private residence Living Arrangements: Alone Available Help at Discharge: Family;Available PRN/intermittently Type of Home: House Home Access: Ramped entrance     Home Layout: Two level;Able to live on main level with bedroom/bathroom     Bathroom Shower/Tub: Chief Strategy Officer: Standard     Home Equipment: Agricultural consultant (2 wheels);Rollator (4 wheels);Wheelchair - manual;Hospital bed;BSC/3in1          Prior Functioning/Environment Prior Level of Function : Independent/Modified Independent             Mobility Comments: no AD, denies falls ADLs Comments: mod I, does not drive    OT Problem List: Decreased strength;Decreased range of motion;Decreased activity tolerance;Impaired balance (sitting and/or standing);Decreased safety awareness;Decreased knowledge of use of DME or AE;Decreased knowledge of precautions;Cardiopulmonary status limiting activity   OT Treatment/Interventions: Therapeutic exercise;Self-care/ADL training;Energy conservation;DME and/or AE instruction;Therapeutic activities;Patient/family education;Balance training      OT Goals(Current goals can be found in the care plan section)   Acute Rehab OT  Goals Patient Stated Goal: to get better OT Goal Formulation: With patient Time For Goal Achievement: 08/23/24 Potential to Achieve Goals: Good ADL Goals Pt Will Perform Grooming: with modified independence;standing Pt Will Perform Lower Body Dressing: with modified independence;sit to/from stand Pt Will Transfer to Toilet: with modified independence;ambulating;regular height toilet Additional ADL Goal #1: Pt will complete at least 8 minutes of OOB functional activity to demonstrated improved tolerance for ADLs Additional ADL Goal #2: Pt will indep recall the 5 P's of energy conservation to improve activity tolerance at discharge   OT Frequency:  Min 2X/week       AM-PAC OT 6 Clicks Daily Activity     Outcome Measure Help from another person eating meals?: None Help from another person taking care of personal grooming?: A Little Help from another person toileting, which includes using toliet, bedpan, or urinal?: A Little Help from another person bathing (including washing, rinsing, drying)?: A Lot Help from another person to put on and taking off regular upper body clothing?: A Little Help from another person to put on and taking off regular lower body clothing?: A Lot 6 Click Score: 17   End of Session Equipment Utilized During Treatment: Rolling walker (2 wheels);Oxygen Nurse Communication: Mobility status  Activity Tolerance: Patient tolerated treatment well Patient left: in chair;with call bell/phone within reach  OT Visit Diagnosis: Unsteadiness on feet (R26.81);Other abnormalities of gait and mobility (R26.89);Muscle weakness (generalized) (M62.81)                Time:  8581-8559 OT Time Calculation (min): 22 min Charges:  OT General Charges $OT Visit: 1 Visit OT Evaluation $OT Eval Moderate Complexity: 1 Mod  Lucie Kendall, OTR/L Acute Rehabilitation Services Office [phone] Secure Chat Communication Preferred m  Lucie JONETTA Kendall 08/09/2024, 3:07 PM

## 2024-08-09 NOTE — Progress Notes (Signed)
 eLink Physician-Brief Progress Note Patient Name: Jeffrey Black. DOB: 09-04-1961 MRN: [id number]   Date of Service  08/09/2024  HPI/Events of Note  Received request for Duoneb for wheezing On review patient is 4 liters positive since admission  eICU Interventions  Ordered Duoneb Ordered chest x ray eLink to be informed once CXR is done     Intervention Category Minor Interventions: Routine modifications to care plan (e.g. PRN medications for pain, fever)  Damien T Jaanai Salemi 08/09/2024, 12:00 AM

## 2024-08-09 NOTE — Progress Notes (Addendum)
   Inpatient Rehabilitation Admissions Coordinator   Await further medical workup and progress before beginning discussions of rehab venue needs.  Heron Leavell, RN, MSN Rehab Admissions Coordinator [phone] 08/09/2024 10:57 AM  Call received from his niece, Christine/Health Care POA. I reviewed possible inpatient hospital rehab goals and that it was too soon yet to determine most appropriate rehab venue options. Patient lived alone prior to admit, does not drive and needs to be independent to return home alone. I will follow up.  Heron Leavell, RN, MSN Rehab Admissions Coordinator [phone] 08/09/2024 1:51 PM

## 2024-08-09 NOTE — Progress Notes (Signed)
 PT Cancellation Note  Patient Details Name: Jeffrey Black. MRN: [id number] DOB: [birth year]   Cancelled Treatment:    Reason Eval/Treat Not Completed: (P) Medical issues which prohibited therapy Pt is currently working with OT and had been very dyspneic. RN request PT follow back tomorrow.  Shantell Belongia B. Fleeta Lapidus PT, DPT Acute Rehabilitation Services Please use secure chat or  Call Office [phone]    Almarie KATHEE Fleeta [facility] 08/09/2024, 2:32 PM

## 2024-08-09 NOTE — Plan of Care (Signed)
  Problem: Education: Goal: Ability to describe self-care measures that may prevent or decrease complications (Diabetes Survival Skills Education) will improve Outcome: Progressing   Problem: Coping: Goal: Ability to adjust to condition or change in health will improve Outcome: Progressing   Problem: Fluid Volume: Goal: Ability to maintain a balanced intake and output will improve Outcome: Progressing   Problem: Health Behavior/Discharge Planning: Goal: Ability to identify and utilize available resources and services will improve Outcome: Progressing Goal: Ability to manage health-related needs will improve Outcome: Progressing   Problem: Metabolic: Goal: Ability to maintain appropriate glucose levels will improve Outcome: Progressing   Problem: Nutritional: Goal: Maintenance of adequate nutrition will improve Outcome: Progressing Goal: Progress toward achieving an optimal weight will improve Outcome: Progressing   Problem: Skin Integrity: Goal: Risk for impaired skin integrity will decrease Outcome: Progressing   Problem: Tissue Perfusion: Goal: Adequacy of tissue perfusion will improve Outcome: Progressing   Problem: Education: Goal: Knowledge of General Education information will improve Description: Including pain rating scale, medication(s)/side effects and non-pharmacologic comfort measures Outcome: Progressing   Problem: Coping: Goal: Level of anxiety will decrease Outcome: Progressing   Problem: Safety: Goal: Ability to remain free from injury will improve Outcome: Progressing

## 2024-08-09 NOTE — H&P (View-Only) (Signed)
 Progress Note  Patient Name: Jeffrey Black. Date of Encounter: 08/09/2024  [facility] Cardiologist: None   Patient Profile     Subjective   Denies chest pain unless he is sitting up. hsTrop peaked at 2114 and trending down to 1143 today.   2D echo showed EF 55 to 60% with no focal wall motion abnormalities and G1 DD as well as small to moderate pericardial effusion  Off pressors this am with soft BP.  Going in and out of afib with RVR  Inpatient Medications    Scheduled Meds:  Chlorhexidine  Gluconate Cloth  6 each Topical Daily   insulin  aspart  0-9 Units Subcutaneous Q4H   nicotine   14 mg Transdermal Daily   pantoprazole  (PROTONIX ) IV  40 mg Intravenous QHS   Continuous Infusions:  heparin  2,400 Units/hr (08/09/24 0644)   norepinephrine  (LEVOPHED ) Adult infusion Stopped (08/08/24 1444)   piperacillin -tazobactam (ZOSYN )  IV 12.5 mL/hr at 08/09/24 0600   PRN Meds: docusate sodium , HYDROmorphone  (DILAUDID ) injection, ipratropium-albuterol , mouth rinse, polyethylene glycol   Vital Signs    Vitals:   08/09/24 0730 08/09/24 0802 08/09/24 0825 08/09/24 0826  BP: (!) 129/91 (!) 140/94    Pulse: (!) 127 (!) 127    Resp: (!) 27 (!) 30    Temp:    99 F (37.2 C)  TempSrc:    Oral  SpO2: (!) 84% 95% 96%   Weight:      Height:        Intake/Output Summary (Last 24 hours) at 08/09/2024 0849 Last data filed at 08/09/2024 0748 Gross per 24 hour  Intake 1724.13 ml  Output 1860 ml  Net -135.87 ml      08/09/2024    4:22 AM 08/08/2024   11:30 AM 08/08/2024    4:53 AM  Last 3 Weights  Weight (lbs) 210 lb 5.1 oz 214 lb 8.1 oz 214 lb 8.1 oz  Weight (kg) 95.4 kg 97.3 kg 97.3 kg      Telemetry    Normal sinus rhythm, PACs and paroxysmal atrial fibrillation with RVR- Personally Reviewed  ECG    No new EKG to review- Personally Reviewed  Physical Exam   GEN: Well nourished, well developed in no acute distress HEENT: Normal NECK: No JVD; No carotid  bruits LYMPHATICS: No lymphadenopathy CARDIAC:irregularly irregular and tachy, no murmurs, rubs, gallops RESPIRATORY:  Clear to auscultation without rales, wheezing or rhonchi  ABDOMEN: Soft, non-tender, non-distended MUSCULOSKELETAL:  No edema; No deformity  SKIN: Warm and dry NEUROLOGIC:  Alert and oriented x 3 PSYCHIATRIC:  Normal affect  Labs    High Sensitivity Troponin:   Recent Labs  Lab 08/07/24 1529 08/07/24 1740 08/08/24 1022 08/08/24 1730  TROPONINIHS 1,350* 1,336* 2,114* 1,143*      Chemistry Recent Labs  Lab 08/07/24 1529 08/07/24 1533 08/08/24 0203 08/09/24 0416  NA 130* 129*  129* 131* 133*  K 4.3 4.3  4.3 4.3 4.3  CL 99 97* 103 103  CO2 19*  --  16* 19*  GLUCOSE 163* 165* 114* 114*  BUN 14 15 17 21   CREATININE 1.21 1.10 1.10 0.97  CALCIUM  8.7*  --  8.4* 8.2*  PROT 5.7*  --   --   --   ALBUMIN  2.9*  --   --   --   AST 27  --   --   --   ALT 40  --   --   --   ALKPHOS 40  --   --   --  BILITOT 0.5  --   --   --   GFRNONAA >60  --  >60 >60  ANIONGAP 12  --  12 11     Hematology Recent Labs  Lab 08/07/24 1529 08/07/24 1533 08/08/24 0203 08/09/24 0416  WBC 17.7*  --  15.6* 13.8*  RBC 4.02*  --  3.96* 3.83*  HGB 11.6* 11.6*  11.6* 11.2* 10.9*  HCT 34.9* 34.0*  34.0* 34.0* 33.2*  MCV 86.8  --  85.9 86.7  MCH 28.9  --  28.3 28.5  MCHC 33.2  --  32.9 32.8  RDW 13.6  --  13.8 14.5  PLT 196  --  193 227    BNP Recent Labs  Lab 08/07/24 1529  BNP 190.5*     DDimer No results for input(s): DDIMER in the last 168 hours.   Radiology    DG Chest Port 1 View Result Date: 08/09/2024 CLINICAL DATA:  10028 Wheezing 89971. EXAM: PORTABLE CHEST 1 VIEW COMPARISON:  08/08/2024. FINDINGS: Mild coarse interstitial markings are grossly similar to the prior study. Bilateral lung fields are clear. No acute consolidation, lung collapse or pulmonary edema. Bilateral costophrenic angles are clear. Redemonstration of elevated right hemidiaphragm.  Stable cardio-mediastinal silhouette. No acute osseous abnormalities. The soft tissues are within normal limits. IMPRESSION: No active disease. Electronically Signed   By: Ree Molt M.D.   On: 08/09/2024 08:15   VAS US  LOWER EXTREMITY VENOUS (DVT) Result Date: 08/08/2024  Lower Venous DVT Study Patient Name:  Jeffrey Black.  Date of Exam:   08/08/2024 Medical Rec #: [id number]             Accession #:    [id number] Date of Birth: December 15, 1960              Patient Gender: M Patient Age:   63 years Exam Location:  [facility] Procedure:      VAS US  LOWER EXTREMITY VENOUS (DVT) Referring Phys: LEITA EINSTEIN --------------------------------------------------------------------------------  Indications: Acute hypoxic respiratory failure.  Comparison Study: No previous exams Performing Technologist: Jody Hill RVT, RDMS  Examination Guidelines: A complete evaluation includes B-mode imaging, spectral Doppler, color Doppler, and power Doppler as needed of all accessible portions of each vessel. Bilateral testing is considered an integral part of a complete examination. Limited examinations for reoccurring indications may be performed as noted. The reflux portion of the exam is performed with the patient in reverse Trendelenburg.  +---------+---------------+---------+-----------+----------+--------------+ RIGHT    CompressibilityPhasicitySpontaneityPropertiesThrombus Aging +---------+---------------+---------+-----------+----------+--------------+ CFV      Full           Yes      Yes                                 +---------+---------------+---------+-----------+----------+--------------+ SFJ      Full                                                        +---------+---------------+---------+-----------+----------+--------------+ FV Prox  Full           Yes      Yes                                 +---------+---------------+---------+-----------+----------+--------------+  FV Mid    Full           Yes      Yes                                 +---------+---------------+---------+-----------+----------+--------------+ FV DistalFull           Yes      Yes                                 +---------+---------------+---------+-----------+----------+--------------+ PFV      Full                                                        +---------+---------------+---------+-----------+----------+--------------+ POP      Full           Yes      Yes                                 +---------+---------------+---------+-----------+----------+--------------+ PTV      Full                                                        +---------+---------------+---------+-----------+----------+--------------+ PERO     Full                                                        +---------+---------------+---------+-----------+----------+--------------+   +---------+---------------+---------+-----------+-------------+--------------+ LEFT     CompressibilityPhasicitySpontaneityProperties   Thrombus Aging +---------+---------------+---------+-----------+-------------+--------------+ CFV      Full           Yes      Yes                                    +---------+---------------+---------+-----------+-------------+--------------+ SFJ      Full                               rouleaux flow               +---------+---------------+---------+-----------+-------------+--------------+ FV Prox  Full           Yes      Yes                                    +---------+---------------+---------+-----------+-------------+--------------+ FV Mid   Full           Yes      Yes                                    +---------+---------------+---------+-----------+-------------+--------------+ FV DistalFull  Yes      Yes                                    +---------+---------------+---------+-----------+-------------+--------------+ PFV      Full                                                            +---------+---------------+---------+-----------+-------------+--------------+ POP      Full           Yes      Yes                                    +---------+---------------+---------+-----------+-------------+--------------+ PTV      Full                                                           +---------+---------------+---------+-----------+-------------+--------------+ PERO     Full                                                           +---------+---------------+---------+-----------+-------------+--------------+     Summary: BILATERAL: - No evidence of deep vein thrombosis seen in the lower extremities, bilaterally. -No evidence of popliteal cyst, bilaterally.   *See table(s) above for measurements and observations. Electronically signed by Debby Robertson on 08/08/2024 at 4:47:20 PM.    Final    ECHOCARDIOGRAM COMPLETE Result Date: 08/08/2024    ECHOCARDIOGRAM REPORT   Patient Name:   Dallon Dacosta. Date of Exam: 08/08/2024 Medical Rec #:  [id number]            Height:       71.5 in Accession #:    [id number]           Weight:       214.5 lb Date of Birth:  01/27/1961             BSA:          2.184 m Patient Age:    63 years             BP:           85/58 mmHg Patient Gender: M                    HR:           104 bpm. Exam Location:  Inpatient Procedure: 2D Echo, Cardiac Doppler and Color Doppler (Both Spectral and Color            Flow Doppler were utilized during procedure). Indications:    R07.9* Chest pain, unspecified  History:        Patient has prior history of Echocardiogram examinations, most                 recent 07/24/2013. Previous Myocardial  Infarction; Risk                 Factors:Hypertension and Diabetes. ETOH. Shock.  Sonographer:    Ellouise Mose RDCS Referring Phys: [id number] LAURA R GLEASON IMPRESSIONS  1. Left ventricular ejection fraction, by estimation, is 55 to 60%. The left ventricle has normal  function. The left ventricle has no regional wall motion abnormalities. There is moderate concentric left ventricular hypertrophy. Left ventricular diastolic parameters are consistent with Grade I diastolic dysfunction (impaired relaxation).  2. Right ventricular systolic function is mildly reduced. The right ventricular size is normal. Tricuspid regurgitation signal is inadequate for assessing PA pressure.  3. The mitral valve is normal in structure. Trivial mitral valve regurgitation. No evidence of mitral stenosis.  4. The aortic valve is tricuspid. There is moderate calcification of the aortic valve. Aortic valve regurgitation is not visualized. Aortic valve sclerosis/calcification is present, without any evidence of aortic stenosis.  5. Aortic dilatation noted. There is borderline dilatation of the aortic root, measuring 38 mm.  6. The inferior vena cava is dilated in size with <50% respiratory variability, suggesting right atrial pressure of 15 mmHg.  7. Small to moderate circumferential pericardial effusion. The IVC is dilated but there is no RV diastolic collapse. There is < 25% respirophasic variation of mitral inflow E wave. There does not appear to be pericardial tamponade. FINDINGS  Left Ventricle: Left ventricular ejection fraction, by estimation, is 55 to 60%. The left ventricle has normal function. The left ventricle has no regional wall motion abnormalities. The left ventricular internal cavity size was normal in size. There is  moderate concentric left ventricular hypertrophy. Left ventricular diastolic parameters are consistent with Grade I diastolic dysfunction (impaired relaxation). Right Ventricle: The right ventricular size is normal. No increase in right ventricular wall thickness. Right ventricular systolic function is mildly reduced. Tricuspid regurgitation signal is inadequate for assessing PA pressure. Left Atrium: Left atrial size was normal in size. Right Atrium: Right atrial size was  normal in size. Pericardium: Small to moderate circumferential pericardial effusion. The IVC is dilated but there is no RV diastolic collapse. There is < 25% respirophasic variation of mitral inflow E wave. There does not appear to be pericardial tamponade. Mitral Valve: The mitral valve is normal in structure. Trivial mitral valve regurgitation. No evidence of mitral valve stenosis. Tricuspid Valve: The tricuspid valve is normal in structure. Tricuspid valve regurgitation is not demonstrated. Aortic Valve: The aortic valve is tricuspid. There is moderate calcification of the aortic valve. Aortic valve regurgitation is not visualized. Aortic valve sclerosis/calcification is present, without any evidence of aortic stenosis. Pulmonic Valve: The pulmonic valve was normal in structure. Pulmonic valve regurgitation is trivial. Aorta: Aortic dilatation noted. There is borderline dilatation of the aortic root, measuring 38 mm. Venous: The inferior vena cava is dilated in size with less than 50% respiratory variability, suggesting right atrial pressure of 15 mmHg. IAS/Shunts: No atrial level shunt detected by color flow Doppler.  LEFT VENTRICLE PLAX 2D LVIDd:         3.90 cm     Diastology LVIDs:         3.10 cm     LV e' medial:    6.36 cm/s LV PW:         1.60 cm     LV E/e' medial:  11.4 LV IVS:        1.90 cm     LV e' lateral:   5.55 cm/s LVOT diam:  2.40 cm     LV E/e' lateral: 13.1 LV SV:         67 LV SV Index:   30 LVOT Area:     4.52 cm  LV Volumes (MOD) LV vol d, MOD A2C: 60.5 ml LV vol d, MOD A4C: 73.9 ml LV vol s, MOD A2C: 41.5 ml LV vol s, MOD A4C: 25.3 ml LV SV MOD A2C:     19.0 ml LV SV MOD A4C:     73.9 ml LV SV MOD BP:      30.7 ml RIGHT VENTRICLE             IVC RV S prime:     15.30 cm/s  IVC diam: 2.70 cm TAPSE (M-mode): 1.9 cm LEFT ATRIUM             Index        RIGHT ATRIUM           Index LA diam:        3.40 cm 1.56 cm/m   RA Area:     16.70 cm LA Vol (A2C):   36.0 ml 16.49 ml/m  RA Volume:    43.70 ml  20.01 ml/m LA Vol (A4C):   25.9 ml 11.86 ml/m LA Biplane Vol: 32.7 ml 14.98 ml/m  AORTIC VALVE LVOT Vmax:   91.70 cm/s LVOT Vmean:  61.600 cm/s LVOT VTI:    0.147 m  AORTA Ao Root diam: 3.80 cm Ao Asc diam:  3.90 cm MITRAL VALVE MV Area (PHT): 6.77 cm    SHUNTS MV Decel Time: 112 msec    Systemic VTI:  0.15 m MV E velocity: 72.70 cm/s  Systemic Diam: 2.40 cm MV A velocity: 83.66 cm/s MV E/A ratio:  0.87 Dalton McleanMD Electronically signed by Ezra Kanner Signature Date/Time: 08/08/2024/3:03:53 PM    Final    DG CHEST PORT 1 VIEW Result Date: 08/08/2024 CLINICAL DATA:  33352 Pneumomediastinum (HCC) 33352 EXAM: PORTABLE CHEST - 1 VIEW COMPARISON:  08/01/2013 FINDINGS: Patchy retrocardiac airspace opacities. No pleural effusion or pneumothorax. Mild cardiomegaly. Tortuous aorta with aortic atherosclerosis. No acute fracture or destructive lesions. Multilevel thoracic osteophytosis. IMPRESSION: 1. Patchy retrocardiac airspace opacities, likely reflecting atelectasis. 2. No pneumomediastinum. Electronically Signed   By: Rogelia Myers M.D.   On: 08/08/2024 09:16   US  EKG SITE RITE Result Date: 08/07/2024 If Site Rite image not attached, placement could not be confirmed due to current cardiac rhythm.  CT ANGIO HEAD NECK W WO CM Result Date: 08/07/2024 CLINICAL DATA:  Vertebral artery dissection suspected. Syncope. Chest pain. EXAM: CT ANGIOGRAPHY HEAD AND NECK WITH AND WITHOUT CONTRAST TECHNIQUE: Multidetector CT imaging of the head and neck was performed using the standard protocol during bolus administration of intravenous contrast. Multiplanar CT image reconstructions and MIPs were obtained to evaluate the vascular anatomy. Carotid stenosis measurements (when applicable) are obtained utilizing NASCET criteria, using the distal internal carotid diameter as the denominator. RADIATION DOSE REDUCTION: This exam was performed according to the departmental dose-optimization program which includes  automated exposure control, adjustment of the mA and/or kV according to patient size and/or use of iterative reconstruction technique. CONTRAST:  OMNIPAQUE  IOHEXOL  350 MG/ML SOLN COMPARISON:  Head CT 06/02/2015 FINDINGS: CT HEAD FINDINGS Brain: There is no evidence of an acute infarct, intracranial hemorrhage, mass, midline shift, or extra-axial fluid collection. Cerebral white matter hypodensities are nonspecific but compatible with mild chronic small vessel ischemic disease. There is mild cerebellar greater than cerebral atrophy. Vascular:  Calcified atherosclerosis at the skull base. Skull: No fracture or suspicious lesion. Sinuses/Orbits: Paranasal sinuses and mastoid air cells are clear. Unremarkable orbits. Other: None. Review of the MIP images confirms the above findings CTA NECK FINDINGS Aortic arch: Standard branching with mild atherosclerosis. Widely patent brachiocephalic and left subclavian arteries. The right subclavian artery appears patent proximally but is otherwise largely obscured by streak artifact from dense venous contrast. Right carotid system: The proximal common carotid artery is obscured by streak artifact, however the vessel is widely patent and normal in appearance more distally. The cervical ICA is patent with mild scattered plaque not resulting in significant stenosis. Left carotid system: Patent with a small amount of plaque at the carotid bifurcation and distal cervical ICA without evidence of a significant stenosis or dissection. Vertebral arteries: Patent and codominant. Dense venous contrast completely obscures the right V2 segment in the C4-5 region. There is no evidence of a flow limiting stenosis or dissection elsewhere. Skeleton: Moderate multilevel cervical disc degeneration and left C2-3 facet arthrosis. Other neck: No evidence of cervical lymphadenopathy or mass, with streak artifact limiting assessment of the lower neck/thoracic inlet. Venous gas scattered throughout  the neck and head, presumably iatrogenic. Upper chest: Clear lung apices. Review of the MIP images confirms the above findings CTA HEAD FINDINGS Anterior circulation: The internal carotid arteries are patent from skull base to carotid termini with mild atherosclerotic calcification not resulting in significant stenosis. ACAs and MCAs are patent without evidence of a proximal branch occlusion or significant proximal stenosis. No aneurysm is identified. Posterior circulation: The intracranial vertebral arteries are widely patent to the basilar with mild atheromatous irregularity involving the right greater than left V4 segments. The right PICA and left AICA appear dominant. Patent SCA origins are visualized bilaterally. The basilar artery is widely patent. There are large left and diminutive or absent right posterior communicating arteries with hypoplasia of the left P1 segment. Both PCAs are patent without evidence of a significant proximal stenosis. No aneurysm is identified. Venous sinuses: As permitted by contrast timing, patent. Anatomic variants: Fetal left PCA. Review of the MIP images confirms the above findings IMPRESSION: 1. No evidence of acute intracranial abnormality. 2. Mild atherosclerosis in the head and neck without a large vessel occlusion, significant stenosis, or dissection identified within above described limitations. Electronically Signed   By: Dasie Hamburg M.D.   On: 08/07/2024 16:27   CT Angio Chest/Abd/Pel for Dissection W and/or Wo Contrast Result Date: 08/07/2024 CLINICAL DATA:  Acute aortic syndrome suspected. EXAM: CT ANGIOGRAPHY CHEST, ABDOMEN AND PELVIS TECHNIQUE: Non-contrast CT of the chest was initially obtained. Multidetector CT imaging through the chest, abdomen and pelvis was performed using the standard protocol during bolus administration of intravenous contrast. Multiplanar reconstructed images and MIPs were obtained and reviewed to evaluate the vascular anatomy. RADIATION  DOSE REDUCTION: This exam was performed according to the departmental dose-optimization program which includes automated exposure control, adjustment of the mA and/or kV according to patient size and/or use of iterative reconstruction technique. CONTRAST:  OMNIPAQUE  IOHEXOL  350 MG/ML SOLN COMPARISON:  CT abdomen 02/25/2013 FINDINGS: CTA CHEST FINDINGS Cardiovascular: Preferential opacification of the thoracic aorta. No evidence of thoracic aortic aneurysm or dissection. Normal heart size. No pericardial effusion. Mediastinum/Nodes: There is a small hiatal hernia. The esophagus appears within normal limits. There are 2 hypodense left thyroid  nodules measuring up to 6 mm. There is a small amount of pneumomediastinum anteriorly. Enlarged subcarinal lymph nodes measure up to 17 mm short axis. Enlarged  right hilar lymph nodes measure up to 11 mm short axis. Enlarged precarinal lymph node measures 14 mm. There also prominent, but nonenlarged paratracheal and AP window lymph nodes. Lungs/Pleura: There is a trace left pleural effusion. There are minimal ground-glass opacities in the inferior left lower lobe. The lungs are otherwise clear. There is no pneumothorax. Musculoskeletal: There is a small amount of air seen within the anterior chest wall, possibly vascular in origin. No acute fractures are seen. Review of the MIP images confirms the above findings. CTA ABDOMEN AND PELVIS FINDINGS VASCULAR Aorta: Normal caliber aorta without aneurysm, dissection, vasculitis or significant stenosis. There is moderate calcified atherosclerotic disease throughout the aorta. Celiac: Patent without evidence of aneurysm, dissection, vasculitis or significant stenosis. SMA: Patent without evidence of aneurysm, dissection, vasculitis or significant stenosis. Renals: Both renal arteries are patent without evidence of aneurysm, dissection, vasculitis, fibromuscular dysplasia or significant stenosis. IMA: Patent without evidence of  aneurysm, dissection, vasculitis or significant stenosis. Inflow: Patent without evidence of aneurysm, dissection, vasculitis or significant stenosis. Mild calcified atherosclerotic disease present. Veins: No obvious venous abnormality within the limitations of this arterial phase study. Review of the MIP images confirms the above findings. NON-VASCULAR Hepatobiliary: No focal liver abnormality is seen. No gallstones, gallbladder wall thickening, or biliary dilatation. Pancreas: Unremarkable. No pancreatic ductal dilatation or surrounding inflammatory changes. Spleen: Normal in size without focal abnormality. Adrenals/Urinary Tract: There is mild nonspecific thickening of the bilateral adrenal glands. The bilateral kidneys appear within normal limits. The bladder is within normal limits. Stomach/Bowel: Stomach is within normal limits. Appendix appears normal. No evidence of bowel wall thickening, distention, or inflammatory changes. There is a large amount of stool throughout the colon. There is sigmoid colon diverticulosis. Lymphatic: There are enlarged periportal lymph nodes measuring up to 16 mm short axis. There are nonenlarged retroperitoneal, iliac chain and inguinal lymph nodes. Reproductive: Prostate is unremarkable. Other: There is a small fat containing right inguinal hernia. There is a small fat containing umbilical hernia. No free fluid. Musculoskeletal: No acute or significant osseous findings. Review of the MIP images confirms the above findings. IMPRESSION: 1. No evidence for aortic dissection or aneurysm. 2. Small amount of pneumomediastinum. 3. Trace left pleural effusion. 4. Minimal ground-glass opacities in the left lower lobe may be infectious/inflammatory. 5. Mediastinal and right hilar lymphadenopathy of uncertain etiology. 6. Periportal lymphadenopathy of uncertain etiology. 7. Sigmoid colon diverticulosis. 8. Subcentimeter incidental left thyroid  nodule. Not clinically significant; no  follow-up imaging recommended (ref: J Am Coll Radiol. 2015 Feb;12(2): 143-50). Aortic Atherosclerosis (ICD10-I70.0). Electronically Signed   By: Greig Pique M.D.   On: 08/07/2024 16:24    Patient Profile     63 y.o. male with a hx of psoriasis, hepatic steatosis, tobacco use, and EtOH use (quit 2016) who is being seen 08/07/2024 for the evaluation of chest pain and elevated troponin.   Assessment & Plan    Chest pain Elevated troponin Hypotension -Patient presented with hypotension and shock felt likely related to sepsis -Chest pain pleuritic and CT showed pneumomediastinum -Elevated high-sensitivity troponins (1350>> 1336>> 2114>> 1143) -No ST changes on EKG -2D echo: Normal LV function EF 55 to 60%, moderate LVH, G1 DD, mild RV dysfunction and small to moderate pericardial effusion - Differential diagnosis for elevated troponin includes type II NSTEMI from shock/hypotension with demand ischemia but given fever, chills and night sweats  and pericardial effusion could also be consistent with acute viral myopericarditis.  Unlikely to be ACS in the setting of normal LV  function - now having PAF with RVR - He does have cardiac risk factors include diabetes, hypertension, ongoing tobacco abuse and family history of heart disease - Continue IV heparin  drip for now - BP soft but likely related to afib with RVR - repeat stat limited echo to make sure pericardial effusion has not increased in size and if stable then get cardiac MRI with contrast to assess for myopericarditis today  Hypertension - Presented with hypotension in the setting of shock - BP improved with IV fluids SBP in the 90's  Paroxysmal atrial fibrillation -started going in and out of afib with RVR this am -currently in afib in the 120's -suspect this is contributing to his soft BP -will give 500cc saline bolus for soft BP -give Amio 150mg  bolus slowly followed by gtt at 60mg /hr -continue IV heparin  gtt for now  Performed  by: Wilbert Bihari, MD   Total critical care time: 45 minutes   Critical care time was exclusive of separately billable procedures and treating other patients.   Critical care was necessary to treat or prevent imminent or life-threatening deterioration.   Critical care was time spent personally by me (independent of APPs or residents) on the following activities: development of treatment plan with patient and/or surrogate as well as nursing, discussions with consultants, evaluation of patient's response to treatment, examination of patient, obtaining history from patient or surrogate, ordering and performing treatments and interventions, ordering and review of laboratory studies, ordering and review of radiographic studies, pulse oximetry and re-evaluation of patient's condition. For questions or updates, please contact [facility] Please consult [email or web address] for contact info under        Signed, Wilbert Bihari, MD  08/09/2024, 8:49 AM

## 2024-08-10 ENCOUNTER — Encounter (HOSPITAL_COMMUNITY)
Admission: EM | Disposition: A | Discharge status: Home / Self Care | Payer: Self-pay | Source: Home / Self Care | Attending: Internal Medicine

## 2024-08-10 DIAGNOSIS — R7989 Other specified abnormal findings of blood chemistry: Secondary | ICD-10-CM | POA: Diagnosis not present

## 2024-08-10 DIAGNOSIS — I951 Orthostatic hypotension: Secondary | ICD-10-CM

## 2024-08-10 DIAGNOSIS — J9601 Acute respiratory failure with hypoxia: Secondary | ICD-10-CM

## 2024-08-10 DIAGNOSIS — I2510 Atherosclerotic heart disease of native coronary artery without angina pectoris: Secondary | ICD-10-CM

## 2024-08-10 DIAGNOSIS — I309 Acute pericarditis, unspecified: Secondary | ICD-10-CM

## 2024-08-10 DIAGNOSIS — R079 Chest pain, unspecified: Secondary | ICD-10-CM | POA: Diagnosis not present

## 2024-08-10 HISTORY — PX: CORONARY STENT INTERVENTION: CATH118234

## 2024-08-10 HISTORY — PX: LEFT HEART CATH AND CORONARY ANGIOGRAPHY: CATH118249

## 2024-08-10 LAB — BASIC METABOLIC PANEL WITH GFR
Anion gap: 12 (ref 5–15)
BUN: 22 mg/dL (ref 8–23)
CO2: 19 mmol/L — ABNORMAL LOW (ref 22–32)
Calcium: 7.9 mg/dL — ABNORMAL LOW (ref 8.9–10.3)
Chloride: 104 mmol/L (ref 98–111)
Creatinine, Ser: 0.96 mg/dL (ref 0.61–1.24)
GFR, Estimated: >60 mL/min (ref >60)
Glucose, Bld: 145 mg/dL — ABNORMAL HIGH (ref 70–99)
Potassium: 3.9 mmol/L (ref 3.5–5.1)
Sodium: 135 mmol/L (ref 135–145)

## 2024-08-10 LAB — CBC
HCT: 30.4 % — ABNORMAL LOW (ref 39.0–52.0)
Hemoglobin: 10.3 g/dL — ABNORMAL LOW (ref 13.0–17.0)
MCH: 29.1 pg (ref 26.0–34.0)
MCHC: 33.9 g/dL (ref 30.0–36.0)
MCV: 85.9 fL (ref 80.0–100.0)
Platelets: 220 K/uL (ref 150–400)
RBC: 3.54 MIL/uL — ABNORMAL LOW (ref 4.22–5.81)
RDW: 14.7 % (ref 11.5–15.5)
WBC: 14.2 K/uL — ABNORMAL HIGH (ref 4.0–10.5)
nRBC: 0 % (ref 0.0–0.2)

## 2024-08-10 LAB — BLOOD GAS, VENOUS
Acid-base deficit: 3 mmol/L — ABNORMAL HIGH (ref 0.0–2.0)
Bicarbonate: 21.1 mmol/L (ref 20.0–28.0)
O2 Saturation: 80.5 %
Patient temperature: 36.9
pCO2, Ven: 34 mmHg — ABNORMAL LOW (ref 44–60)
pH, Ven: 7.4 (ref 7.25–7.43)
pO2, Ven: 48 mmHg — ABNORMAL HIGH (ref 32–45)

## 2024-08-10 LAB — POCT ACTIVATED CLOTTING TIME
Activated Clotting Time: 268 s
Activated Clotting Time: 233 s

## 2024-08-10 LAB — MAGNESIUM: Magnesium: 2.4 mg/dL (ref 1.7–2.4)

## 2024-08-10 LAB — GLUCOSE, CAPILLARY
Glucose-Capillary: 125 mg/dL — ABNORMAL HIGH (ref 70–99)
Glucose-Capillary: 103 mg/dL — ABNORMAL HIGH (ref 70–99)
Glucose-Capillary: 113 mg/dL — ABNORMAL HIGH (ref 70–99)
Glucose-Capillary: 111 mg/dL — ABNORMAL HIGH (ref 70–99)
Glucose-Capillary: 142 mg/dL — ABNORMAL HIGH (ref 70–99)

## 2024-08-10 LAB — LACTIC ACID, PLASMA
Lactic Acid, Venous: 1.6 mmol/L (ref 0.5–1.9)
Lactic Acid, Venous: 1.4 mmol/L (ref 0.5–1.9)

## 2024-08-10 LAB — HEPARIN LEVEL (UNFRACTIONATED): Heparin Unfractionated: 0.31 [IU]/mL (ref 0.30–0.70)

## 2024-08-10 LAB — PHOSPHORUS: Phosphorus: 2 mg/dL — ABNORMAL LOW (ref 2.5–4.6)

## 2024-08-10 SURGERY — LEFT HEART CATH AND CORONARY ANGIOGRAPHY
Anesthesia: LOCAL

## 2024-08-10 MED ORDER — HEPARIN (PORCINE) IN NACL 1000-0.9 UT/500ML-% IV SOLN
INTRAVENOUS | Status: DC | PRN
  Administered 2024-08-10: 1000 mL via INTRA_ARTERIAL

## 2024-08-10 MED ORDER — HEPARIN SODIUM (PORCINE) 1000 UNIT/ML IJ SOLN
INTRAMUSCULAR | Status: DC | PRN
  Administered 2024-08-10: 4000 [IU] via INTRAVENOUS
  Administered 2024-08-10: 2000 [IU] via INTRAVENOUS
  Administered 2024-08-10: 4000 [IU] via INTRAVENOUS
  Administered 2024-08-10: 2000 [IU] via INTRAVENOUS

## 2024-08-10 MED ORDER — LABETALOL HCL 5 MG/ML IV SOLN: 10.0000 mg | INTRAVENOUS | Status: DC | PRN

## 2024-08-10 MED ORDER — ONDANSETRON HCL 4 MG/2ML IJ SOLN: 4.0000 mg | Freq: Four times a day (QID) | INTRAMUSCULAR | Status: DC | PRN

## 2024-08-10 MED ORDER — CLOPIDOGREL BISULFATE 75 MG PO TABS
300.0000 mg | ORAL_TABLET | Freq: Once | ORAL | Status: AC
  Administered 2024-08-11: 300 mg via ORAL
  Filled 2024-08-10: qty 4

## 2024-08-10 MED ORDER — VERAPAMIL HCL 2.5 MG/ML IV SOLN
INTRAVENOUS | Status: AC
Start: 2024-08-10 — End: 2024-08-10
  Filled 2024-08-10: qty 2

## 2024-08-10 MED ORDER — SODIUM CHLORIDE 0.9% FLUSH
3.0000 mL | Freq: Two times a day (BID) | INTRAVENOUS | Status: DC
  Administered 2024-08-11 – 2024-08-14 (×7): 3 mL via INTRAVENOUS

## 2024-08-10 MED ORDER — LIDOCAINE HCL (PF) 1 % IJ SOLN
INTRAMUSCULAR | Status: DC | PRN
  Administered 2024-08-10: 2 mL

## 2024-08-10 MED ORDER — ASPIRIN 81 MG PO CHEW
81.0000 mg | CHEWABLE_TABLET | Freq: Every day | ORAL | Status: DC
  Administered 2024-08-11 – 2024-08-14 (×4): 81 mg via ORAL
  Filled 2024-08-10 (×4): qty 1

## 2024-08-10 MED ORDER — MIDAZOLAM HCL 2 MG/2ML IJ SOLN
INTRAMUSCULAR | Status: DC | PRN
  Administered 2024-08-10: 1 mg via INTRAVENOUS

## 2024-08-10 MED ORDER — LIDOCAINE HCL (PF) 1 % IJ SOLN
INTRAMUSCULAR | Status: AC
  Filled 2024-08-10: qty 30

## 2024-08-10 MED ORDER — HEPARIN SODIUM (PORCINE) 1000 UNIT/ML IJ SOLN
INTRAMUSCULAR | Status: AC
  Filled 2024-08-10: qty 10

## 2024-08-10 MED ORDER — TICAGRELOR 90 MG PO TABS
ORAL_TABLET | ORAL | Status: DC | PRN
  Administered 2024-08-10: 180 mg via ORAL

## 2024-08-10 MED ORDER — HEPARIN (PORCINE) 25000 UT/250ML-% IV SOLN
2600.0000 [IU]/h | INTRAVENOUS | Status: DC
  Administered 2024-08-11: 2600 [IU]/h via INTRAVENOUS

## 2024-08-10 MED ORDER — SODIUM CHLORIDE 0.9 % IV SOLN: 250.0000 mL | INTRAVENOUS | Status: AC | PRN

## 2024-08-10 MED ORDER — ACETAMINOPHEN 325 MG PO TABS: 650.0000 mg | ORAL_TABLET | ORAL | Status: DC | PRN

## 2024-08-10 MED ORDER — MIDAZOLAM HCL 2 MG/2ML IJ SOLN
INTRAMUSCULAR | Status: AC
  Filled 2024-08-10: qty 2

## 2024-08-10 MED ORDER — IPRATROPIUM BROMIDE 0.02 % IN SOLN
0.5000 mg | Freq: Four times a day (QID) | RESPIRATORY_TRACT | Status: AC
  Administered 2024-08-10 – 2024-08-11 (×2): 0.5 mg via RESPIRATORY_TRACT
  Filled 2024-08-10 (×2): qty 2.5

## 2024-08-10 MED ORDER — GUAIFENESIN ER 600 MG PO TB12
600.0000 mg | ORAL_TABLET | Freq: Two times a day (BID) | ORAL | Status: DC
  Administered 2024-08-10 – 2024-08-14 (×9): 600 mg via ORAL
  Filled 2024-08-10 (×9): qty 1

## 2024-08-10 MED ORDER — METHYLPREDNISOLONE SODIUM SUCC 40 MG IJ SOLR
40.0000 mg | Freq: Every day | INTRAMUSCULAR | Status: DC
  Administered 2024-08-10 – 2024-08-14 (×5): 40 mg via INTRAVENOUS
  Filled 2024-08-10 (×5): qty 1

## 2024-08-10 MED ORDER — CLOPIDOGREL BISULFATE 75 MG PO TABS
75.0000 mg | ORAL_TABLET | Freq: Every day | ORAL | Status: DC
  Administered 2024-08-12 – 2024-08-14 (×3): 75 mg via ORAL
  Filled 2024-08-10 (×3): qty 1

## 2024-08-10 MED ORDER — IOHEXOL 350 MG/ML SOLN
INTRAVENOUS | Status: DC | PRN
  Administered 2024-08-10: 110 mL via INTRA_ARTERIAL

## 2024-08-10 MED ORDER — TICAGRELOR 90 MG PO TABS
ORAL_TABLET | ORAL | Status: AC
  Filled 2024-08-10: qty 2

## 2024-08-10 MED ORDER — FREE WATER
500.0000 mL | Freq: Once | Status: AC
  Administered 2024-08-10: 500 mL via ORAL

## 2024-08-10 MED ORDER — VERAPAMIL HCL 2.5 MG/ML IV SOLN
INTRAVENOUS | Status: DC | PRN
  Administered 2024-08-10: 10 mL via INTRA_ARTERIAL

## 2024-08-10 MED ORDER — VERAPAMIL HCL 2.5 MG/ML IV SOLN
INTRAVENOUS | Status: AC
  Filled 2024-08-10: qty 2

## 2024-08-10 MED ORDER — FUROSEMIDE 10 MG/ML IJ SOLN
INTRAMUSCULAR | Status: AC
  Filled 2024-08-10: qty 4

## 2024-08-10 MED ORDER — LEVALBUTEROL HCL 0.63 MG/3ML IN NEBU
0.6300 mg | INHALATION_SOLUTION | Freq: Four times a day (QID) | RESPIRATORY_TRACT | Status: DC
  Administered 2024-08-10 – 2024-08-11 (×4): 0.63 mg via RESPIRATORY_TRACT
  Filled 2024-08-10 (×4): qty 3

## 2024-08-10 MED ORDER — BUDESONIDE 0.5 MG/2ML IN SUSP
0.5000 mg | Freq: Two times a day (BID) | RESPIRATORY_TRACT | Status: DC
  Administered 2024-08-10 – 2024-08-14 (×9): 0.5 mg via RESPIRATORY_TRACT
  Filled 2024-08-10 (×9): qty 2

## 2024-08-10 MED ORDER — FUROSEMIDE 10 MG/ML IJ SOLN
INTRAMUSCULAR | Status: DC | PRN
  Administered 2024-08-10: 40 mg via INTRAVENOUS

## 2024-08-10 MED ORDER — POTASSIUM PHOSPHATES 15 MMOLE/5ML IV SOLN
15.0000 mmol | Freq: Once | INTRAVENOUS | Status: AC
  Administered 2024-08-10: 15 mmol via INTRAVENOUS
  Filled 2024-08-10: qty 5

## 2024-08-10 MED ORDER — REVEFENACIN 175 MCG/3ML IN SOLN
175.0000 ug | Freq: Every day | RESPIRATORY_TRACT | Status: DC
  Administered 2024-08-11 – 2024-08-14 (×4): 175 ug via RESPIRATORY_TRACT
  Filled 2024-08-10 (×5): qty 3

## 2024-08-10 MED ORDER — SODIUM CHLORIDE 0.9% FLUSH: 3.0000 mL | INTRAVENOUS | Status: DC | PRN

## 2024-08-10 MED ORDER — IPRATROPIUM-ALBUTEROL 0.5-2.5 (3) MG/3ML IN SOLN
3.0000 mL | Freq: Four times a day (QID) | RESPIRATORY_TRACT | Status: DC
  Filled 2024-08-10: qty 3

## 2024-08-10 MED ORDER — HYDRALAZINE HCL 20 MG/ML IJ SOLN: 10.0000 mg | INTRAMUSCULAR | Status: DC | PRN

## 2024-08-10 MED ORDER — FREE WATER: 250.0000 mL | Freq: Once | Status: DC

## 2024-08-10 MED ORDER — ASPIRIN 81 MG PO CHEW
81.0000 mg | CHEWABLE_TABLET | ORAL | Status: AC
  Administered 2024-08-10: 81 mg via ORAL
  Filled 2024-08-10: qty 1

## 2024-08-10 SURGICAL SUPPLY — 14 items
BALLOON EMERGE MR 3.5X15 (BALLOONS) IMPLANT
BALLOON SAPPHIRE NC24 4.0X12 (BALLOONS) IMPLANT
CATH INFINITI 5 FR JL3.5 (CATHETERS) IMPLANT
CATH INFINITI AMBI 5FR TG (CATHETERS) IMPLANT
CATH LAUNCHER 6FR JR4 (CATHETERS) IMPLANT
DEVICE RAD COMP TR BAND LRG (VASCULAR PRODUCTS) IMPLANT
GLIDESHEATH SLEND SS 6F .021 (SHEATH) IMPLANT
GUIDEWIRE INQWIRE 1.5J.035X260 (WIRE) IMPLANT
KIT ENCORE 26 ADVANTAGE (KITS) IMPLANT
PACK CARDIAC CATHETERIZATION (CUSTOM PROCEDURE TRAY) ×1 IMPLANT
SET ATX-X65L (MISCELLANEOUS) IMPLANT
SHEATH PROBE COVER 6X72 (BAG) IMPLANT
STENT SYNERGY XD 3.50X20 (Permanent Stent) IMPLANT
WIRE RUNTHROUGH .014X180CM (WIRE) IMPLANT

## 2024-08-10 NOTE — Progress Notes (Signed)
 RT instructed patient on the use of a flutter valve and incentive spirometer. Patient able to demonstrate back good technique and reached 1000 mL using the incentive spirometer.

## 2024-08-10 NOTE — Progress Notes (Signed)
 NAME:  Jeffrey Black., MRN:  [id number], DOB:  [birth year], LOS: 3 ADMISSION DATE:  08/07/2024, CONSULTATION DATE:  08/07/24 REFERRING MD:  EDP, CHIEF COMPLAINT:  CP   History of Present Illness:  63 year old man w/ hx of psoriasis, DM, prior EtOH abuse in remission p/w 2 days of chest pain.  Located sternal, worse with inspiration.  No clear inciting event except headaches.  + fevers/chills.  +syncope, recurrent.  Pain is sharp deep and radiates to back.  +orthostasis.  BP extremely low on admit, Received 1L LR started on pressors, and taken for CTA H/C/A/P showing pneumomediastinum, no dissection, no PE.  EKG no STEMI.  Pertinent  Medical History   Past Medical History:  Diagnosis Date   Depression    Diabetes (HCC)    ETOH abuse    History of bronchitis    Hypertension    Psoriasis    Substance abuse (HCC)    Significant Hospital Events: Including procedures, antibiotic start and stop dates in addition to other pertinent events   9/21 admit 9/22 pressor requirement improving, LE DVT neg 9/23 off pressors, trop trending down, still on [place] but feeling more short of breath this AM with sinus tach/ PAF, EF 55-60, mod pericardial effusion w/o tamponade, no RWMA  Interim History / Subjective:  Lactic up overnight 2.3> 3.1, NS 500 ml bolus given SBP 90-120's, remains off pressors Stable UOP, sCr Remains in NSR Increased SOB and audible wheeze this am, denies any CP.  Slept poorly overnight  Objective    General:  ill appearing older male lying in bed in moderate distress HEENT: MM pink/moist, mild JVD Neuro: Awake, oriented, MAE CV: rr, ST 108, no obvious murmur PULM:  tachypneic in upper 20's with conversational dyspnea.  Prominent upper airway wheeze, diminished in lower, prolonged expiratory  GI: soft, bs+, NT, foley with CYU Extremities: warm/dry, no tibial edema  Skin: no rashes  Afebrile UOP 1.8L Net +4.4L Labs> K3.9, sCr 0.96, phos 2, mag 2.4, bicarb 19, WBC 13.8>  14.2, H/H stable 10.3/30.4   9/24 Cardiac MRI>  1. Cine images were not interpretable due to artifact from cardiac/respiratory motion. Unable to calculate LV/RV volumes and EF 2. Subendocardial LGE in apical inferior wall consistent with small infarct 3. Elevated myocardial T1/T2/ECV values consistent with myocardial edema 4. Moderate pericardial effusion measuring up to 1.5cm adjacent to LV inferior wall 5.  Dilated ascending aorta measuring 40mm 6. Dilated main pulmonary artery measuring 34mm. RV insertion site LGE, which can be seen in setting of elevated pulmonary pressures 7. Clinical presentation suggests myocarditis, and meets criteria for acute myocarditis on CMR with regional elevation in T1/T2/ECV levels. However, given LGE imaging shows evidence of small infarct in apical inferior wall, would be important to clarify if this is an acute or old infarct. Recommend cardiac catheterization. Given elevated T1/T2 levels are not just located around the infarct, but in multiple regions throughout the LV, suspect more likely myocarditis but important to rule out acute infarct  Resolved problem list   Assessment and Plan   Shock, undifferentiated  Acute Hypoxic Respiratory Failure Pneumomediastinum, resolved mediastinal adenopathy Pericardial effusion Chest pain- concern for viral myocarditis  Lactic acidosis  9/21 CTA c/a/p for dissection w/wo> neg for aortic dissection or aneurysm, small pneumomediastinum, trace left pleural effusion, minimal LLL GGO, mediastinal and right hilar lymphadenopathy and periportal lymphadenopathy of uncertain etiology LE DVT neg Trop down-trending 9/23 Echo with preserved EF, but small to moderate pericardial effusion without  tamponade  CXR without pneumomediastinum 9/23 -cardiac MRI 9/23 concerning for acute myocarditis however some evidence of small apical inferior wall infarct, unclear if acute or old.  Unable to determine EF.  Did not eat breakfast>  plans for [facility] today with cardiology.  Defer NSAIDs to cards - cont ASA/ heparin  gtt per pharmacy  - afebrile, stable leukocytosis - BC ngtd x 3 days - cont zosyn  day 4/x - lactic up overnight but off pressors.  Suspect 2/2 to SOB/ WOB.  Rechecked this am and normalized 1.6   - goal MAP > 65 - possible AECOPD with wheezing and SOB this am.  No prior pulmonary workup but is active smoker, 4 cigarettes/ day - change nebs to xopenex , pulmicort  and yupelri  - supplemental O2 for sat goal > 92% - consider BiPAP vs HHFNC if increasing WOB, has not liked neb mask - start solumedrol 40mg  daily - pulm hygiene> IS/ flutter, guaifenesin  - discussed he would benefit from outpt pulmonary formal workup - tobacco cessation> pt does not want to restart smoking - consider diuresis as +4.4L but have held given softer Bps and pending LHC today - d/c foley> unclear why placed, ?retention.  Reports chronic low flow and frequent nocturia but PSA 0.65 on admit.  Bladder scan prn, and start retention meds if cath needs to be replaced - depending LHC and respiratory trajectory> looks better after nebs, consider tx to PCU later today   PAF - tele monitoring, remains NSR/ ST overnight - amiodarone  per cards - heparin  gtt per pharmacy - optimize electrolytes  HTN - SBP remain 90-120, cont to hold pta meds  HA 9/21 CT CTA head/ neck neg - remains non- focal  Hyponatremia, resolved  Suspected acute phase reactant - normalized, trend on labs   Hypophosphorous - K 3.9, s/p Kphos this am, trend on labs   Labs   CBC: Recent Labs  Lab 08/07/24 1529 08/07/24 1533 08/08/24 0203 08/09/24 0416 08/10/24 0021  WBC 17.7*  --  15.6* 13.8* 14.2*  NEUTROABS 15.0*  --   --   --   --   HGB 11.6* 11.6*  11.6* 11.2* 10.9* 10.3*  HCT 34.9* 34.0*  34.0* 34.0* 33.2* 30.4*  MCV 86.8  --  85.9 86.7 85.9  PLT 196  --  193 227 220    Basic Metabolic Panel: Recent Labs  Lab 08/07/24 1529 08/07/24 1533  08/08/24 0203 08/09/24 0416 08/10/24 0021  NA 130* 129*  129* 131* 133* 135  K 4.3 4.3  4.3 4.3 4.3 3.9  CL 99 97* 103 103 104  CO2 19*  --  16* 19* 19*  GLUCOSE 163* 165* 114* 114* 145*  BUN 14 15 17 21 22   CREATININE 1.21 1.10 1.10 0.97 0.96  CALCIUM  8.7*  --  8.4* 8.2* 7.9*  MG  --   --  1.9 2.4 2.4  PHOS  --   --  3.2 2.6 2.0*   GFR: Estimated Creatinine Clearance: 94.5 mL/min (by C-G formula based on SCr of 0.96 mg/dL). Recent Labs  Lab 08/07/24 1529 08/07/24 1533 08/07/24 1740 08/08/24 0203 08/08/24 1023 08/09/24 0416 08/09/24 1443 08/09/24 2135 08/10/24 0021  PROCALCITON  --   --  2.03  --   --   --   --   --   --   WBC 17.7*  --   --  15.6*  --  13.8*  --   --  14.2*  LATICACIDVEN  --  2.4*  --   --  1.5  --  2.3* 3.1*  --     Liver Function Tests: Recent Labs  Lab 08/07/24 1529  AST 27  ALT 40  ALKPHOS 40  BILITOT 0.5  PROT 5.7*  ALBUMIN  2.9*   Recent Labs  Lab 08/07/24 1529  LIPASE 11   No results for input(s): AMMONIA in the last 168 hours.  ABG    Component Value Date/Time   HCO3 20.8 08/07/2024 1533   TCO2 20 (L) 08/07/2024 1533   TCO2 22 08/07/2024 1533   ACIDBASEDEF 5.0 (H) 08/07/2024 1533   O2SAT 74 08/07/2024 1533     Coagulation Profile: Recent Labs  Lab 08/07/24 1529  INR 1.4*    Cardiac Enzymes: No results for input(s): CKTOTAL, CKMB, CKMBINDEX, TROPONINI in the last 168 hours.  HbA1C: Hemoglobin A1C  Date/Time Value Ref Range Status  03/31/2024 03:18 PM 6.5 (A) 4.0 - 5.6 % Final  09/24/2023 03:08 PM 7.1 (A) 4.0 - 5.6 % Final   Hgb A1c MFr Bld  Date/Time Value Ref Range Status  02/24/2013 06:40 AM 6.1 (H) <5.7 % Final    Comment:    (NOTE)                                                                       According to the ADA Clinical Practice Recommendations for 2011, when HbA1c is used as a screening test:  >=6.5%   Diagnostic of Diabetes Mellitus           (if abnormal result is  confirmed) 5.7-6.4%   Increased risk of developing Diabetes Mellitus References:Diagnosis and Classification of Diabetes Mellitus,Diabetes Care,2011,34(Suppl 1):S62-S69 and Standards of Medical Care in         Diabetes - 2011,Diabetes Care,2011,34 (Suppl 1):S11-S61.    CBG: Recent Labs  Lab 08/09/24 1204 08/09/24 1556 08/09/24 1935 08/09/24 2319 08/10/24 0306  GLUCAP 121* 172* 130* 147* 125*   Allergies No Known Allergies   Home Medications  Prior to Admission medications   Medication Sig Start Date End Date Taking? Authorizing Provider  Accu-Chek Softclix Lancets lancets  08/28/23   [provider]  Blood Glucose Monitoring Suppl DEVI 1 each by Does not apply route in the morning, at noon, and at bedtime. May substitute to any manufacturer covered by patient's insurance. 02/06/23   [name], MD  clobetasol  ointment (TEMOVATE ) 0.05 % Apply 1 Application topically 2 (two) times daily. Patient not taking: Reported on 06/09/2024 02/06/23   [name], MD  empagliflozin  (JARDIANCE ) 25 MG TABS tablet Take 1 tablet (25 mg total) by mouth daily before breakfast. 03/31/24   [name], MD  Glucose Blood (BLOOD GLUCOSE TEST STRIPS) STRP 1 each by In Vitro route daily. May substitute to any manufacturer covered by patient's insurance. Patient not taking: Reported on 06/23/2024 02/06/23   [name], MD  metFORMIN  (GLUCOPHAGE ) 500 MG tablet Take 2 tablets (1,000 mg total) by mouth 2 (two) times daily with a meal. 04/22/24   [name], MD  olmesartan  (BENICAR ) 5 MG tablet Take 1 tablet (5 mg total) by mouth daily. 03/31/24   [name], MD  rosuvastatin  (CRESTOR ) 5 MG tablet Take 1 tablet (5 mg total) by mouth daily. 03/31/24  [name], MD  Secukinumab  (COSENTYX  UNOREADY) 300 MG/2ML SOAJ Inject 300 mg into the skin every 28 (twenty-eight) days. 05/25/24   Rice, Lonni ORN, MD  triamcinolone  cream (KENALOG ) 0.1 % Apply 1 Application  topically 2 (two) times daily as needed. Patient not taking: Reported on 06/23/2024 06/01/24   Jeannetta Lonni ORN, MD     Critical care time:       CRITICAL CARE Performed by: Lyle Pesa   Total critical care time: 38 minutes  Critical care time was exclusive of separately billable procedures and treating other patients.  Critical care was necessary to treat or prevent imminent or life-threatening deterioration.  Critical care was time spent personally by me on the following activities: development of treatment plan with patient and/or surrogate as well as nursing, discussions with consultants, evaluation of patient's response to treatment, examination of patient, obtaining history from patient or surrogate, ordering and performing treatments and interventions, ordering and review of laboratory studies, ordering and review of radiographic studies, pulse oximetry and re-evaluation of patient's condition.     Lyle Pesa, NP [facility] 08/10/2024, 7:13 AM  See Amion for pager If no response to pager , please call [phone] until 7pm After 7:00 pm call Elink  [phone]

## 2024-08-10 NOTE — Plan of Care (Signed)

## 2024-08-10 NOTE — Progress Notes (Signed)
 Progress Note  Patient Name: Jeffrey Black. Date of Encounter: 08/10/2024  [facility] Cardiologist: None   Patient Profile     Subjective   hsTrop peaked at 2114 and trending down  2D echo showed EF 55 to 60% with no focal wall motion abnormalities and G1 DD as well as small to moderate pericardial effusion  Went into afib with RVR yesterday and loaded with IV Amio  cMRI showed possible myopericarditis as well as possible inferior infarct  Denies any current CP.  Still SOB but gets very anxious  Inpatient Medications    Scheduled Meds:  budesonide  (PULMICORT ) nebulizer solution  0.5 mg Nebulization BID   Chlorhexidine  Gluconate Cloth  6 each Topical Daily   insulin  aspart  0-9 Units Subcutaneous Q4H   ipratropium-albuterol   3 mL Nebulization Q6H   methylPREDNISolone  (SOLU-MEDROL ) injection  40 mg Intravenous Daily   nicotine   14 mg Transdermal Daily   pantoprazole  (PROTONIX ) IV  40 mg Intravenous QHS   Continuous Infusions:  amiodarone  30 mg/hr (08/10/24 0800)   heparin  2,600 Units/hr (08/10/24 0800)   norepinephrine  (LEVOPHED ) Adult infusion Stopped (08/08/24 1444)   piperacillin -tazobactam (ZOSYN )  IV 12.5 mL/hr at 08/10/24 0800   potassium PHOSPHATE  IVPB (in mmol) 43 mL/hr at 08/10/24 0800   PRN Meds: docusate sodium , HYDROmorphone  (DILAUDID ) injection, ipratropium-albuterol , lip balm, mouth rinse, polyethylene glycol   Vital Signs    Vitals:   08/10/24 0700 08/10/24 0800 08/10/24 0834 08/10/24 0846  BP: (!) 89/64 113/86 113/86   Pulse: 79 (!) 105 (!) 109   Resp: 18 (!) 26 (!) 30   Temp: 98.6 F (37 C)     TempSrc: Oral     SpO2: 90% 90% 92% 93%  Weight:      Height:        Intake/Output Summary (Last 24 hours) at 08/10/2024 0905 Last data filed at 08/10/2024 0800 Gross per 24 hour  Intake 2727.22 ml  Output 1800 ml  Net 927.22 ml      08/10/2024    4:22 AM 08/09/2024    4:22 AM 08/08/2024   11:30 AM  Last 3 Weights  Weight (lbs)  214 lb 15.2 oz 210 lb 5.1 oz 214 lb 8.1 oz  Weight (kg) 97.5 kg 95.4 kg 97.3 kg      Telemetry    NSR- Personally Reviewed  ECG    No new EKG to review- Personally Reviewed  Physical Exam   GEN: Well nourished, well developed in no acute distress HEENT: Normal NECK: No JVD; No carotid bruits LYMPHATICS: No lymphadenopathy CARDIAC:RRR, no murmurs, rubs, gallops RESPIRATORY:  Clear to auscultation without rales, wheezing or rhonchi  ABDOMEN: Soft, non-tender, non-distended MUSCULOSKELETAL:  No edema; No deformity  SKIN: Warm and dry NEUROLOGIC:  Alert and oriented x 3 PSYCHIATRIC:  Normal affect  Labs    High Sensitivity Troponin:   Recent Labs  Lab 08/07/24 1529 08/07/24 1740 08/08/24 1022 08/08/24 1730 08/09/24 1443  TROPONINIHS 1,350* 1,336* 2,114* 1,143* 1,076*      Chemistry Recent Labs  Lab 08/07/24 1529 08/07/24 1533 08/08/24 0203 08/09/24 0416 08/10/24 0021  NA 130*   < > 131* 133* 135  K 4.3   < > 4.3 4.3 3.9  CL 99   < > 103 103 104  CO2 19*  --  16* 19* 19*  GLUCOSE 163*   < > 114* 114* 145*  BUN 14   < > 17 21 22   CREATININE 1.21   < >  1.10 0.97 0.96  CALCIUM  8.7*  --  8.4* 8.2* 7.9*  PROT 5.7*  --   --   --   --   ALBUMIN  2.9*  --   --   --   --   AST 27  --   --   --   --   ALT 40  --   --   --   --   ALKPHOS 40  --   --   --   --   BILITOT 0.5  --   --   --   --   GFRNONAA >60  --  >60 >60 >60  ANIONGAP 12  --  12 11 12    < > = values in this interval not displayed.     Hematology Recent Labs  Lab 08/08/24 0203 08/09/24 0416 08/10/24 0021  WBC 15.6* 13.8* 14.2*  RBC 3.96* 3.83* 3.54*  HGB 11.2* 10.9* 10.3*  HCT 34.0* 33.2* 30.4*  MCV 85.9 86.7 85.9  MCH 28.3 28.5 29.1  MCHC 32.9 32.8 33.9  RDW 13.8 14.5 14.7  PLT 193 227 220    BNP Recent Labs  Lab 08/07/24 1529 08/09/24 1443  BNP 190.5* 436.5*     DDimer No results for input(s): DDIMER in the last 168 hours.   Radiology    MR CARDIAC MORPHOLOGY W WO  CONTRAST Result Date: 08/09/2024 CLINICAL DATA:  42M p/w shock, chest pain, troponin elevated to 2114. Echo with EF 50-55%, moderate pericardial effusion. EXAM: CARDIAC MRI TECHNIQUE: The patient was scanned on a 1.5 Tesla Siemens magnet. A dedicated cardiac coil was used. Functional imaging was done using Fiesta sequences. 2,3, and 4 chamber views were done to assess for RWMA's. Modified Simpson's rule using a short axis stack was used to calculate an ejection fraction on a dedicated work [employer]. The patient received 10 cc of Gadavist . After 10 minutes inversion recovery sequences were used to assess for infiltration and scar tissue. Phase contrast velocity mapping was performed CONTRAST:  10 cc  of Gadavist  FINDINGS: Left ventricle: -Cine images were not interpretable due to artifact from cardiac/respiratory motion. Unable to calculate LV volumes and EF -Subendocardial LGE in apical inferior wall consistent with small infarct -Elevated myocardial T1/T2/ECV values consistent with myocardial edema. -RV insertion site LGE, which can be seen in setting of elevated pulmonary pressures Right ventricle: Cine images were not interpretable due to artifact from cardiac/respiratory motion. 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 NO pericardial LGE IMPRESSION: 1. Cine images were not interpretable due to artifact from cardiac/respiratory motion. Unable to calculate LV/RV volumes and EF 2. Subendocardial LGE in apical inferior wall consistent with small infarct 3. Elevated myocardial T1/T2/ECV values consistent with myocardial edema 4. Moderate pericardial effusion measuring up to 1.5cm adjacent to LV inferior wall 5.   Dilated ascending aorta measuring 40mm 6. Dilated main pulmonary artery measuring 34mm. RV insertion site LGE, which can be seen in setting of elevated pulmonary pressures 7. Clinical presentation suggests myocarditis, and meets criteria for acute myocarditis on CMR with regional elevation in T1/T2/ECV levels. However, given LGE imaging shows evidence of small infarct in apical inferior wall, would be important to clarify if this is an acute or old infarct. Recommend cardiac catheterization.  Given elevated T1/T2 levels are not just located around the infarct, but in multiple regions throughout the LV, suspect more likely myocarditis but important to rule out acute infarct Electronically Signed   By: Lonni Nanas M.D.   On: 08/09/2024 22:43   ECHOCARDIOGRAM LIMITED Result Date: 08/09/2024    ECHOCARDIOGRAM LIMITED REPORT   Patient Name:   Jeffrey Black. Date of Exam: 08/09/2024 Medical Rec #:  [id number]            Height:       71.5 in Accession #:    [id number]           Weight:       210.3 lb Date of Birth:  [birth year]             BSA:          2.165 m Patient Age:    63 years             BP:           93/70 mmHg Patient Gender: M                    HR:           84 bpm. Exam Location:  Inpatient Procedure: Limited Echo and Cardiac Doppler (Both Spectral and Color Flow            Doppler were utilized during procedure). Indications:    Pericardial Effusion I31.3  History:        Patient has prior history of Echocardiogram examinations, most                 recent 08/08/2024.  Sonographer:    Tinnie Gosling RDCS Referring Phys: [phone] Lexxus Underhill R Keyri Salberg IMPRESSIONS  1. Left ventricular ejection fraction, by estimation, is 50 to 55%. The left ventricle has low normal function. There is mild concentric left ventricular hypertrophy.  2. Right ventricular systolic function is mildly reduced. Tricuspid regurgitation signal is inadequate for assessing PA pressure.  3. Moderate pericardial effusion. The pericardial  effusion is circumferential. There is no evidence of cardiac tamponade.  4. The inferior vena cava is dilated in size with <50% respiratory variability, suggesting right atrial pressure of 20 mmHg. Comparison(s): No significant change from prior study. Prior images reviewed side by side. FINDINGS  Left Ventricle: Left ventricular ejection fraction, by estimation, is 50 to 55%. The left ventricle has low normal function. The left ventricular internal cavity size was normal in size. There is mild concentric left ventricular hypertrophy. Right Ventricle: Right ventricular systolic function is mildly reduced. Tricuspid regurgitation signal is inadequate for assessing PA pressure. Pericardium: A moderately sized pericardial effusion is present. The pericardial effusion is circumferential. There is no evidence of cardiac tamponade. Venous: The inferior vena cava is dilated in size with less than 50% respiratory variability, suggesting right atrial pressure of 15 mmHg. LEFT VENTRICLE PLAX 2D LVIDd:         4.60 cm LVIDs:         2.80 cm LV PW:         1.20 cm LV IVS:        1.20 cm  IVC IVC diam: 2.90 cm Stanly Leavens MD Electronically signed by Stanly Leavens MD Signature Date/Time: 08/09/2024/11:43:30 AM    Final    DG Chest Port 1 View Result Date: 08/09/2024 CLINICAL DATA:  [id number] Wheezing [id number]. EXAM: PORTABLE CHEST 1 VIEW COMPARISON:  08/08/2024. FINDINGS: Mild coarse interstitial markings  are grossly similar to the prior study. Bilateral lung fields are clear. No acute consolidation, lung collapse or pulmonary edema. Bilateral costophrenic angles are clear. Redemonstration of elevated right hemidiaphragm. Stable cardio-mediastinal silhouette. No acute osseous abnormalities. The soft tissues are within normal limits. IMPRESSION: No active disease. Electronically Signed   By: Ree Molt M.D.   On: 08/09/2024 08:15   VAS US  LOWER EXTREMITY VENOUS (DVT) Result Date: 08/08/2024  Lower Venous DVT  Study Patient Name:  Jeffrey Black.  Date of Exam:   08/08/2024 Medical Rec #: [id number]             Accession #:    [id number] Date of Birth: Apr 28, 1961              Patient Gender: M Patient Age:   8 years Exam Location:  [facility] Procedure:      VAS US  LOWER EXTREMITY VENOUS (DVT) Referring Phys: LEITA EINSTEIN --------------------------------------------------------------------------------  Indications: Acute hypoxic respiratory failure.  Comparison Study: No previous exams Performing Technologist: Jody Hill RVT, RDMS  Examination Guidelines: A complete evaluation includes B-mode imaging, spectral Doppler, color Doppler, and power Doppler as needed of all accessible portions of each vessel. Bilateral testing is considered an integral part of a complete examination. Limited examinations for reoccurring indications may be performed as noted. The reflux portion of the exam is performed with the patient in reverse Trendelenburg.  +---------+---------------+---------+-----------+----------+--------------+ RIGHT    CompressibilityPhasicitySpontaneityPropertiesThrombus Aging +---------+---------------+---------+-----------+----------+--------------+ CFV      Full           Yes      Yes                                 +---------+---------------+---------+-----------+----------+--------------+ SFJ      Full                                                        +---------+---------------+---------+-----------+----------+--------------+ FV Prox  Full           Yes      Yes                                 +---------+---------------+---------+-----------+----------+--------------+ FV Mid   Full           Yes      Yes                                 +---------+---------------+---------+-----------+----------+--------------+ FV DistalFull           Yes      Yes                                 +---------+---------------+---------+-----------+----------+--------------+  PFV      Full                                                        +---------+---------------+---------+-----------+----------+--------------+ POP  Full           Yes      Yes                                 +---------+---------------+---------+-----------+----------+--------------+ PTV      Full                                                        +---------+---------------+---------+-----------+----------+--------------+ PERO     Full                                                        +---------+---------------+---------+-----------+----------+--------------+   +---------+---------------+---------+-----------+-------------+--------------+ LEFT     CompressibilityPhasicitySpontaneityProperties   Thrombus Aging +---------+---------------+---------+-----------+-------------+--------------+ CFV      Full           Yes      Yes                                    +---------+---------------+---------+-----------+-------------+--------------+ SFJ      Full                               rouleaux flow               +---------+---------------+---------+-----------+-------------+--------------+ FV Prox  Full           Yes      Yes                                    +---------+---------------+---------+-----------+-------------+--------------+ FV Mid   Full           Yes      Yes                                    +---------+---------------+---------+-----------+-------------+--------------+ FV DistalFull           Yes      Yes                                    +---------+---------------+---------+-----------+-------------+--------------+ PFV      Full                                                           +---------+---------------+---------+-----------+-------------+--------------+ POP      Full           Yes      Yes                                     +---------+---------------+---------+-----------+-------------+--------------+  PTV      Full                                                           +---------+---------------+---------+-----------+-------------+--------------+ PERO     Full                                                           +---------+---------------+---------+-----------+-------------+--------------+     Summary: BILATERAL: - No evidence of deep vein thrombosis seen in the lower extremities, bilaterally. -No evidence of popliteal cyst, bilaterally.   *See table(s) above for measurements and observations. Electronically signed by Debby Robertson on 08/08/2024 at 4:47:20 PM.    Final    ECHOCARDIOGRAM COMPLETE Result Date: 08/08/2024    ECHOCARDIOGRAM REPORT   Patient Name:   Jeffrey Black. Date of Exam: 08/08/2024 Medical Rec #:  [id number]            Height:       71.5 in Accession #:    [id number]           Weight:       214.5 lb Date of Birth:  [birth year]             BSA:          2.184 m Patient Age:    63 years             BP:           85/58 mmHg Patient Gender: M                    HR:           104 bpm. Exam Location:  Inpatient Procedure: 2D Echo, Cardiac Doppler and Color Doppler (Both Spectral and Color            Flow Doppler were utilized during procedure). Indications:    R07.9* Chest pain, unspecified  History:        Patient has prior history of Echocardiogram examinations, most                 recent 07/24/2013. Previous Myocardial Infarction; Risk                 Factors:Hypertension and Diabetes. ETOH. Shock.  Sonographer:    Ellouise Mose RDCS Referring Phys: [id number] LAURA R GLEASON IMPRESSIONS  1. Left ventricular ejection fraction, by estimation, is 55 to 60%. The left ventricle has normal function. The left ventricle has no regional wall motion abnormalities. There is moderate concentric left ventricular hypertrophy. Left ventricular diastolic parameters are consistent with Grade I diastolic dysfunction  (impaired relaxation).  2. Right ventricular systolic function is mildly reduced. The right ventricular size is normal. Tricuspid regurgitation signal is inadequate for assessing PA pressure.  3. The mitral valve is normal in structure. Trivial mitral valve regurgitation. No evidence of mitral stenosis.  4. The aortic valve is tricuspid. There is moderate calcification of the aortic valve. Aortic valve regurgitation is not visualized. Aortic valve sclerosis/calcification is present, without any evidence of aortic stenosis.  5. Aortic dilatation noted. There is borderline dilatation of  the aortic root, measuring 38 mm.  6. The inferior vena cava is dilated in size with <50% respiratory variability, suggesting right atrial pressure of 15 mmHg.  7. Small to moderate circumferential pericardial effusion. The IVC is dilated but there is no RV diastolic collapse. There is < 25% respirophasic variation of mitral inflow E wave. There does not appear to be pericardial tamponade. FINDINGS  Left Ventricle: Left ventricular ejection fraction, by estimation, is 55 to 60%. The left ventricle has normal function. The left ventricle has no regional wall motion abnormalities. The left ventricular internal cavity size was normal in size. There is  moderate concentric left ventricular hypertrophy. Left ventricular diastolic parameters are consistent with Grade I diastolic dysfunction (impaired relaxation). Right Ventricle: The right ventricular size is normal. No increase in right ventricular wall thickness. Right ventricular systolic function is mildly reduced. Tricuspid regurgitation signal is inadequate for assessing PA pressure. Left Atrium: Left atrial size was normal in size. Right Atrium: Right atrial size was normal in size. Pericardium: Small to moderate circumferential pericardial effusion. The IVC is dilated but there is no RV diastolic collapse. There is < 25% respirophasic variation of mitral inflow E wave. There does not  appear to be pericardial tamponade. Mitral Valve: The mitral valve is normal in structure. Trivial mitral valve regurgitation. No evidence of mitral valve stenosis. Tricuspid Valve: The tricuspid valve is normal in structure. Tricuspid valve regurgitation is not demonstrated. Aortic Valve: The aortic valve is tricuspid. There is moderate calcification of the aortic valve. Aortic valve regurgitation is not visualized. Aortic valve sclerosis/calcification is present, without any evidence of aortic stenosis. Pulmonic Valve: The pulmonic valve was normal in structure. Pulmonic valve regurgitation is trivial. Aorta: Aortic dilatation noted. There is borderline dilatation of the aortic root, measuring 38 mm. Venous: The inferior vena cava is dilated in size with less than 50% respiratory variability, suggesting right atrial pressure of 15 mmHg. IAS/Shunts: No atrial level shunt detected by color flow Doppler.  LEFT VENTRICLE PLAX 2D LVIDd:         3.90 cm     Diastology LVIDs:         3.10 cm     LV e' medial:    6.36 cm/s LV PW:         1.60 cm     LV E/e' medial:  11.4 LV IVS:        1.90 cm     LV e' lateral:   5.55 cm/s LVOT diam:     2.40 cm     LV E/e' lateral: 13.1 LV SV:         67 LV SV Index:   30 LVOT Area:     4.52 cm  LV Volumes (MOD) LV vol d, MOD A2C: 60.5 ml LV vol d, MOD A4C: 73.9 ml LV vol s, MOD A2C: 41.5 ml LV vol s, MOD A4C: 25.3 ml LV SV MOD A2C:     19.0 ml LV SV MOD A4C:     73.9 ml LV SV MOD BP:      30.7 ml RIGHT VENTRICLE             IVC RV S prime:     15.30 cm/s  IVC diam: 2.70 cm TAPSE (M-mode): 1.9 cm LEFT ATRIUM             Index        RIGHT ATRIUM           Index LA diam:  3.40 cm 1.56 cm/m   RA Area:     16.70 cm LA Vol (A2C):   36.0 ml 16.49 ml/m  RA Volume:   43.70 ml  20.01 ml/m LA Vol (A4C):   25.9 ml 11.86 ml/m LA Biplane Vol: 32.7 ml 14.98 ml/m  AORTIC VALVE LVOT Vmax:   91.70 cm/s LVOT Vmean:  61.600 cm/s LVOT VTI:    0.147 m  AORTA Ao Root diam: 3.80 cm Ao Asc diam:   3.90 cm MITRAL VALVE MV Area (PHT): 6.77 cm    SHUNTS MV Decel Time: 112 msec    Systemic VTI:  0.15 m MV E velocity: 72.70 cm/s  Systemic Diam: 2.40 cm MV A velocity: 83.66 cm/s MV E/A ratio:  0.87 Dalton McleanMD Electronically signed by Ezra Kanner Signature Date/Time: 08/08/2024/3:03:53 PM    Final     Patient Profile     63 y.o. male with a hx of psoriasis, hepatic steatosis, tobacco use, and EtOH use (quit 2016) who is being seen 08/07/2024 for the evaluation of chest pain and elevated troponin.   Assessment & Plan    Chest pain Elevated troponin Hypotension Acute Myocarditis -Patient presented with hypotension and shock felt likely related to sepsis -Chest pain pleuritic and CT showed pneumomediastinum -Elevated high-sensitivity troponins (1350>> 1336>> 2114>> 1143) -No ST changes on EKG -2D echo: Normal LV function EF 55 to 60%, moderate LVH, G1 DD, mild RV dysfunction and small to moderate pericardial effusion - Differential diagnosis for elevated troponin includes type II NSTEMI from shock/hypotension with demand ischemia but given fever, chills and night sweats  and pericardial effusion could also be consistent with acute viral myopericarditis.  Unlikely to be ACS in the setting of normal LV function - He does have cardiac risk factors include diabetes, hypertension, ongoing tobacco abuse and family history of heart disease - repeat echo yesterday with moderate pericardial effusion - cMRI showed SE LGE in apical inferior wall c/w small infarct but also evidence of myocardial edema and moderate pericardial effusion>>meets criteria for acute myocarditis but also ? Inferior infarct age undetermined - recommend LHC to define coronary anatomy based on findings on MRI Informed Consent   Shared Decision Making/Informed Consent The risks [stroke (1 in 1000), death (1 in 1000), kidney failure [usually temporary] (1 in 500), bleeding (1 in 200), allergic reaction [possibly serious] (1 in  200)], benefits (diagnostic support and management of coronary artery disease) and alternatives of a cardiac catheterization were discussed in detail with Mr. Wurzer and he is willing to proceed. - Continue IV heparin  drip for now - BP has been too soft for addition of BB therapy  Hypertension - Presented with hypotension in the setting of shock - BP improved with IV fluids and control of afib with RVR  Paroxysmal atrial fibrillation -started going in and out of afib with RVR this 9/23 -started on IV Amio gtt and maintaining NSR on tele -once more stable will transition to oral>>do not recommend long term therapy with this ' -add BB once more stable -continue IV Heparin  gtt for now -CHADS2VASC score 2  Performed by: Wilbert Bihari, MD   Total critical care time: 45 minutes   Critical care time was exclusive of separately billable procedures and treating other patients.   Critical care was necessary to treat or prevent imminent or life-threatening deterioration.   Critical care was time spent personally by me (independent of APPs or residents) on the following activities: development of treatment plan with patient and/or surrogate as  well as nursing, discussions with consultants, evaluation of patient's response to treatment, examination of patient, obtaining history from patient or surrogate, ordering and performing treatments and interventions, ordering and review of laboratory studies, ordering and review of radiographic studies, pulse oximetry and re-evaluation of patient's condition. For questions or updates, please contact [facility] Please consult [email or web address] for contact info under        Signed, Wilbert Bihari, MD  08/10/2024, 9:05 AM

## 2024-08-10 NOTE — Progress Notes (Signed)
 [facility] ADULT ICU REPLACEMENT PROTOCOL   The patient does apply for the [facility] Adult ICU Electrolyte Replacment Protocol based on the criteria listed below:   1.Exclusion criteria: TCTS, ECMO, Dialysis, and Myasthenia Gravis patients 2. Is GFR >/= 30 ml/min? Yes.    Patient's GFR today is >60 3. Is SCr </= 2? Yes.   Patient's SCr is 0.96 mg/dL 4. Did SCr increase >/= 0.5 in 24 hours? No. 5.Pt's weight >40kg  Yes.   6. Abnormal electrolyte(s):   Phos 2.0  7. Electrolytes replaced per protocol 8.  Call MD STAT for K+ </= 2.5, Phos </= 1, or Mag </= 1 Physician:  CLEMENTEEN Epimenio Blackbird R Jameica Couts 08/10/2024 5:36 AM

## 2024-08-10 NOTE — Plan of Care (Signed)
  Problem: Education: Goal: Ability to describe self-care measures that may prevent or decrease complications (Diabetes Survival Skills Education) will improve Outcome: Progressing   Problem: Coping: Goal: Ability to adjust to condition or change in health will improve Outcome: Progressing   Problem: Health Behavior/Discharge Planning: Goal: Ability to manage health-related needs will improve Outcome: Progressing   Problem: Metabolic: Goal: Ability to maintain appropriate glucose levels will improve Outcome: Progressing   Problem: Nutritional: Goal: Maintenance of adequate nutrition will improve Outcome: Progressing Goal: Progress toward achieving an optimal weight will improve Outcome: Progressing   Problem: Skin Integrity: Goal: Risk for impaired skin integrity will decrease Outcome: Progressing   Problem: Tissue Perfusion: Goal: Adequacy of tissue perfusion will improve Outcome: Progressing   Problem: Education: Goal: Knowledge of General Education information will improve Description: Including pain rating scale, medication(s)/side effects and non-pharmacologic comfort measures Outcome: Progressing   Problem: Health Behavior/Discharge Planning: Goal: Ability to manage health-related needs will improve Outcome: Progressing   Problem: Clinical Measurements: Goal: Respiratory complications will improve Outcome: Progressing Goal: Cardiovascular complication will be avoided Outcome: Progressing   Problem: Activity: Goal: Risk for activity intolerance will decrease Outcome: Progressing   Problem: Nutrition: Goal: Adequate nutrition will be maintained Outcome: Progressing   Problem: Elimination: Goal: Will not experience complications related to bowel motility Outcome: Progressing Goal: Will not experience complications related to urinary retention Outcome: Progressing   Problem: Pain Managment: Goal: General experience of comfort will improve and/or be  controlled Outcome: Progressing   Problem: Safety: Goal: Ability to remain free from injury will improve Outcome: Progressing   Problem: Skin Integrity: Goal: Risk for impaired skin integrity will decrease Outcome: Progressing

## 2024-08-10 NOTE — Interval H&P Note (Signed)
 History and Physical Interval Note:  08/10/2024 4:40 PM  Jeffrey Black.  has presented today for surgery, with the diagnosis of nstemi.  The various methods of treatment have been discussed with the patient and family. After consideration of risks, benefits and other options for treatment, the patient has consented to  Procedure(s): LEFT HEART CATH AND CORONARY ANGIOGRAPHY (N/A)  PERCUTANEOUS CORONARY INTERVENTION    as a surgical intervention.  The patient's history has been reviewed, patient examined, no change in status, stable for surgery.  I have reviewed the patient's chart and labs.  Questions were answered to the patient's satisfaction.     Cath Lab Visit (complete for each Cath Lab visit)  Clinical Evaluation Leading to the Procedure:   ACS: Yes.    Non-ACS:    Anginal Classification: CCS II  Anti-ischemic medical therapy: No Therapy  Non-Invasive Test Results: No non-invasive testing performed  Prior CABG: No previous CABG   Alm Clay

## 2024-08-10 NOTE — Brief Op Note (Signed)
 08/10/2024 5:58 PM [name] MRN: [id number]  Primary Care Provider: [name], MD Inpatient Attending: Kassie Acquanetta Bradley, MD  Referring Cardiologist: Shlomo Wilbert SAUNDERS, MD  SURGEON:  Surgeons and Role:    DEWAINE Anner Alm LELON, MD - Primary  PROCEDURE:  Procedure(s): LEFT HEART CATH AND CORONARY ANGIOGRAPHY (N/A) CORONARY STENT INTERVENTION (N/A)  PATIENT:  [name]  63 y.o. male  PRE-OPERATIVE DIAGNOSIS:  nstemi  POST-OPERATIVE DIAGNOSIS:  * Likely culprit lesion for MI (with inferior MI seen on MRI) is a ulcerated plaque (65%) in the mid RCA just proximal to a high bifurcation into PDA and PAV.,   Otherwise the rest of the RCA with several PL branches and PDA are free of disease.  Successful DES PCI of 65% ulcerated plaque mid RCA stenting from RCA into RPA V with 3.5 mm x 20 mm Synergy XD deployed 3.8 mm, and postdilated proximally to 4.1 mm upstream of the bifurcation.  The stent was fully expanded however there still remains a small ledge of the plaque outside of the stent but notable improvement of flow. = Lesion essentially reduced to 0%>  LCA:  Left main bifurcates in the LAD which has a proximal D1 and then roughly 20% stenosis at the takeoff of a multiple branching D2.  The vessel then tapers into a smaller vessel that reaches down around the apex. LCx gives off a small caliber AV groove branch with 2 small branches and then courses as a lateral OM with distal bifurcation.  Essentially normal. Severely  elevated LVEDP of roughly 30 mmHg consistent with Acute Diastolic Heart Failure Treated with 40 mg IV Lasix  in the Cath Lab.  PROCEDURE PERFORMED: Time Out: Verified patient identification, verified procedure, site/side was marked, verified correct patient position, special equipment/implants available, medications/allergies/relevent history reviewed, required imaging and test results available. Performed.  Access:  Right Radial Artery: 6 Fr sheath  -- Seldinger technique using Micropuncture Kit -- Direct ultrasound guidance used.  Permanent image obtained and placed on chart. -- 10 mL radial cocktail IA; 4000 units IV Heparin   Left Heart Catheterization: 5 and 6 Fr Catheters advanced or exchanged over a J-wire under direct fluoroscopic guidance into the ascending aorta; TIG 4.0 catheter advanced first.  *Case plan  Review of initial angiography revealed: Focal ulcerated plaque just prior to RCA bifurcation  Preparations are made for RCA PCI - Additional boluses of IV heparin  were administered to achieve and maintain ACT> 250 seconds - He was loaded with 180 mg Brilinta , however this may be changed to clopidogrel  in the morning if the plan will be for him to be on DOAC with new onset of A-fib  Upon completion of Angiogaphy, the catheter was removed completely out of the body over a wire, without complication.  Radial sheath removed in the Cardiac Catheterization lab with TR Band placed for hemostasis.  TR Band: 1750 Hours; 10 mL air  MEDICATIONS Radial Cocktail: 3 mg Verapmil in 10 mL NS Heparin : 12,000 units (4000, 4000, 2000 x 2) Brilinta  180 mg p.o.   ANESTHESIA:   local and IV sedation; 3 mL SQ lidocaine ; 1 mg Versed   EBL:  <50 mL  PATIENT DISPOSITION:  He was taken to 2C12 progressive care unit for post-cath care in stable condition.  He is on IV amiodarone  and oxygen.  Somewhat tachypneic but stable compared to prior to the procedure.  COUNTS:  YES  DICTATION: .Note written in EPIC  PLAN OF CARE: Continue ongoing care  for URI and likely myocarditis.  I suspect that the MRI and RCA lesion were incidental findings.  I have written to convert from Brilinta  to clopidogrel  (loading dose 300 mg morning 08/11/2024 and then 25 mg daily after that, provided that the plan is to place the patient on a DOAC.  With elevated EDP of roughly 30 mmHg, would likely consider additional diuresis (he was given 40 mg IV Lasix  in the Cath Lab  and would assess in the morning.  Once stable from a cardiovascular standpoint, can begin to titrate GDMT.    Delay start of Pharmacological VTE agent (>24hrs) due to surgical blood loss or risk of bleeding: not applicable    Alm MICAEL Clay, MD, MS Alm Clay, M.D., M.S. Interventional Cardiologist  [facility] Pager # [phone]

## 2024-08-10 NOTE — Progress Notes (Signed)
 PHARMACY - ANTICOAGULATION CONSULT NOTE  Pharmacy Consult for heparin  Indication: chest pain/ACS  No Known Allergies  Patient Measurements: Height: 5' 11.5 (181.6 cm) Weight: 95.4 kg (210 lb 5.1 oz) IBW/kg (Calculated) : 76.44 HEPARIN  DW (KG): 96.1  Vital Signs: Temp: 98 F (36.7 C) (09/24 0000) Temp Source: Oral (09/24 0000) BP: 95/76 (09/24 0000) Pulse Rate: 98 (09/24 0000)  Labs: Recent Labs    08/07/24 1529 08/07/24 1533 08/08/24 0203 08/08/24 1022 08/08/24 1730 08/09/24 0416 08/09/24 1236 08/09/24 1443 08/10/24 0021  HGB 11.6*   < > 11.2*  --   --  10.9*  --   --  10.3*  HCT 34.9*   < > 34.0*  --   --  33.2*  --   --  30.4*  PLT 196  --  193  --   --  227  --   --  220  LABPROT 17.5*  --   --   --   --   --   --   --   --   INR 1.4*  --   --   --   --   --   --   --   --   HEPARINUNFRC  --    < > <0.10* <0.10* <0.10* 0.17* 0.30  --  0.31  CREATININE 1.21   < > 1.10  --   --  0.97  --   --  0.96  TROPONINIHS 1,350*   < >  --  2,114* 1,143*  --   --  1,076*  --    < > = values in this interval not displayed.    Estimated Creatinine Clearance: 93.6 mL/min (by C-G formula based on SCr of 0.96 mg/dL).   Assessment: 30 YOM presenting with shock, pericardial pain, now concern for ACS, he is not on anticoagulation PTA. Troponins elevated.  HL 0.31 - therapeutic on low end of goal at 2500 units/hr No issues infusion or bleeding per RN. Hgb stable 10-11s, platelets are normal.  Goal of Therapy:  Heparin  level 0.3-0.7 units/ml Monitor platelets by anticoagulation protocol: Yes   Plan:  Increase heparin  infusion to 2600 units/hr to keep level >0.3 Daily heparin  level, CBC Monitor for s/sx of bleeding  Thank you for involving pharmacy in this patient's care.  Delon Sax, PharmD, BCPS Clinical Pharmacist 08/10/2024 1:07 AM

## 2024-08-10 NOTE — Progress Notes (Signed)
 PHARMACY - ANTICOAGULATION CONSULT NOTE  Pharmacy Consult for heparin  Indication: chest pain/ACS  No Known Allergies  Patient Measurements: Height: 5' 11.5 (181.6 cm) Weight: 97.5 kg (214 lb 15.2 oz) IBW/kg (Calculated) : 76.44 HEPARIN  DW (KG): 96.1  Vital Signs: Temp: 98.8 F (37.1 C) (09/24 1816) Temp Source: Oral (09/24 1816) BP: 127/92 (09/24 1816) Pulse Rate: 94 (09/24 1816)  Labs: Recent Labs    08/08/24 0203 08/08/24 1022 08/08/24 1730 08/09/24 0416 08/09/24 1236 08/09/24 1443 08/10/24 0021  HGB 11.2*  --   --  10.9*  --   --  10.3*  HCT 34.0*  --   --  33.2*  --   --  30.4*  PLT 193  --   --  227  --   --  220  HEPARINUNFRC <0.10* <0.10* <0.10* 0.17* 0.30  --  0.31  CREATININE 1.10  --   --  0.97  --   --  0.96  TROPONINIHS  --  2,114* 1,143*  --   --  1,076*  --     Estimated Creatinine Clearance: 94.5 mL/min (by C-G formula based on SCr of 0.96 mg/dL).   Assessment: 88 YOM presenting with shock, pericardial pain, now concern for ACS, he is not on anticoagulation PTA. Troponins elevated.  9/24 AM: HL 0.31 - therapeutic on low end of goal at 2500 units/hr No issues infusion or bleeding per RN. Hgb stable 10-11s, platelets are normal.  9/24 PM: now s/p cath, heparin  to resume 2hr post TR band removal. Will resume at previous rate of 2600 units/hr as HL was therapeutic this morning.  Goal of Therapy:  Heparin  level 0.3-0.7 units/ml Monitor platelets by anticoagulation protocol: Yes   Plan:  2hr after TR band off, resume heparin  infusion at 2600 units/hr  Check heparin  level 6hr after Daily heparin  level, CBC Monitor for s/sx of bleeding F/u for transition to DOAC  Thank you for involving pharmacy in this patient's care.  Rocky Slade, PharmD, BCPS Clinical Pharmacist 08/10/2024 6:39 PM

## 2024-08-10 NOTE — Progress Notes (Signed)
 Received patient having SOB using accessory muscles, hand tremors and sweating. Sats  95% on Room Air. VS WNL. Lung sound is wheezy. Alert 4x. BS 142. Still TR band in place in right wrist, level 0 and frequently reminding pt not to put pressure on that hand dt pt has been  restless.  Dr Melia Fowler to evaluate pt.  See Orders.  Plan of Care continues.

## 2024-08-10 NOTE — Progress Notes (Signed)
 Physical Therapy Treatment Patient Details Name: Jeffrey Black. MRN: [id number] DOB: 04/29/1961 Today's Date: 08/10/2024   History of Present Illness Pt is 63 year old presented to [facility] on  [facility]/07/24 for chest pain and syncopal episodes. Pt hypotensive and started on pressors. Pt with acute hypoxemic respiratory failure secondary to URI and septic shock. Chest pain was pleuritic and CT showed pneumomediastinum. PMH - DM, psoriasis, etoh use in remission    PT Comments  Currently pt is CGA for sit to stand, transfers and gait with RW. Pt continues to be limited by cardiopulmonary status and endurance deficits. Pt requires frequent reminders for safety in order to decrease risk for falls. Due to pt current functional status, home set up and available assistance at home recommending skilled physical therapy services > 3 hours/day in order to address strength, balance and functional mobility to decrease risk for falls, injury, immobility, skin break down and re-hospitalization.      If plan is discharge home, recommend the following: A little help with walking and/or transfers;Help with stairs or ramp for entrance;Assist for transportation;Assistance with cooking/housework     Equipment Recommendations  None recommended by PT       Precautions / Restrictions Precautions Precautions: Fall;Other (comment) Precaution/Restrictions Comments: watch HR and O2 Restrictions Weight Bearing Restrictions Per Provider Order: No     Mobility  Bed Mobility     General bed mobility comments: pt sitting in recliner on arrival and departure.    Transfers Overall transfer level: Needs assistance Equipment used: Rolling walker (2 wheels) Transfers: Sit to/from Stand Sit to Stand: Contact guard assist           General transfer comment: cues for safe hand placement.    Ambulation/Gait Ambulation/Gait assistance: Contact guard assist Gait Distance (Feet): 120 Feet Assistive device: Rolling  walker (2 wheels) Gait Pattern/deviations: Step-through pattern, Decreased stride length Gait velocity: decreased Gait velocity interpretation: <1.8 ft/sec, indicate of risk for recurrent falls   General Gait Details: Assist for safety and lines. Pt tremulous throguhout, no overt LOB    Balance Overall balance assessment: Needs assistance Sitting-balance support: No upper extremity supported, Feet supported Sitting balance-Leahy Scale: Good     Standing balance support: Single extremity supported, During functional activity, Bilateral upper extremity supported Standing balance-Leahy Scale: Poor Standing balance comment: UE support           Cognition Arousal: Alert Behavior During Therapy: WFL for tasks assessed/performed   PT - Cognitive impairments: No apparent impairments     Following commands: Impaired Following commands impaired: Follows multi-step commands inconsistently, Only follows one step commands consistently    Cueing Cueing Techniques: Verbal cues     General Comments General comments (skin integrity, edema, etc.): Pt denies dizziness, SPO2 down to 74% during gait with poor pleth line; increased O2 to 4L with continued poor pleth line and low reading. HR remained steady from 70-90's      Pertinent Vitals/Pain Pain Assessment Pain Assessment: No/denies pain     PT Goals (current goals can now be found in the care plan section) Acute Rehab PT Goals Patient Stated Goal: return home PT Goal Formulation: With patient Time For Goal Achievement: 08/22/24 Potential to Achieve Goals: Good Progress towards PT goals: Progressing toward goals    Frequency    Min 2X/week      PT Plan  Continue with current POC        AM-PAC PT 6 Clicks Mobility   Outcome Measure  Help needed  turning from your back to your side while in a flat bed without using bedrails?: A Little Help needed moving from lying on your back to sitting on the side of a flat bed  without using bedrails?: A Little Help needed moving to and from a bed to a chair (including a wheelchair)?: A Little Help needed standing up from a chair using your arms (e.g., wheelchair or bedside chair)?: A Little Help needed to walk in hospital room?: A Little Help needed climbing 3-5 steps with a railing? : A Lot 6 Click Score: 17    End of Session Equipment Utilized During Treatment: Gait belt;Oxygen Activity Tolerance: Patient tolerated treatment well Patient left: in chair;with call bell/phone within reach;with chair alarm set Nurse Communication: Mobility status PT Visit Diagnosis: Unsteadiness on feet (R26.81);Muscle weakness (generalized) (M62.81);Other abnormalities of gait and mobility (R26.89)     Time: 8470-8456 PT Time Calculation (min) (ACUTE ONLY): 14 min  Charges:    $Therapeutic Activity: 8-22 mins PT General Charges $$ ACUTE PT VISIT: 1 Visit                     Dorothyann Maier, DPT, CLT  Acute Rehabilitation Services Office: [phone] (Secure chat preferred)    Dorothyann VEAR Maier 08/10/2024, 4:14 PM

## 2024-08-11 ENCOUNTER — Other Ambulatory Visit (HOSPITAL_COMMUNITY): Payer: Self-pay

## 2024-08-11 ENCOUNTER — Encounter (HOSPITAL_COMMUNITY): Payer: Self-pay | Admitting: Cardiology

## 2024-08-11 ENCOUNTER — Inpatient Hospital Stay (HOSPITAL_COMMUNITY)

## 2024-08-11 ENCOUNTER — Telehealth (HOSPITAL_COMMUNITY): Payer: Self-pay | Admitting: Pharmacy Technician

## 2024-08-11 DIAGNOSIS — I517 Cardiomegaly: Secondary | ICD-10-CM | POA: Diagnosis not present

## 2024-08-11 DIAGNOSIS — R7989 Other specified abnormal findings of blood chemistry: Secondary | ICD-10-CM | POA: Diagnosis not present

## 2024-08-11 DIAGNOSIS — I4891 Unspecified atrial fibrillation: Secondary | ICD-10-CM | POA: Diagnosis not present

## 2024-08-11 DIAGNOSIS — R579 Shock, unspecified: Secondary | ICD-10-CM | POA: Diagnosis not present

## 2024-08-11 DIAGNOSIS — I309 Acute pericarditis, unspecified: Secondary | ICD-10-CM | POA: Diagnosis not present

## 2024-08-11 DIAGNOSIS — I319 Disease of pericardium, unspecified: Secondary | ICD-10-CM

## 2024-08-11 DIAGNOSIS — I5031 Acute diastolic (congestive) heart failure: Secondary | ICD-10-CM

## 2024-08-11 DIAGNOSIS — J90 Pleural effusion, not elsewhere classified: Secondary | ICD-10-CM | POA: Diagnosis not present

## 2024-08-11 DIAGNOSIS — E7800 Pure hypercholesterolemia, unspecified: Secondary | ICD-10-CM

## 2024-08-11 DIAGNOSIS — I214 Non-ST elevation (NSTEMI) myocardial infarction: Secondary | ICD-10-CM | POA: Diagnosis not present

## 2024-08-11 DIAGNOSIS — R079 Chest pain, unspecified: Secondary | ICD-10-CM | POA: Diagnosis not present

## 2024-08-11 DIAGNOSIS — R918 Other nonspecific abnormal finding of lung field: Secondary | ICD-10-CM | POA: Diagnosis not present

## 2024-08-11 DIAGNOSIS — R0902 Hypoxemia: Secondary | ICD-10-CM | POA: Diagnosis not present

## 2024-08-11 LAB — GLUCOSE, CAPILLARY
Glucose-Capillary: 122 mg/dL — ABNORMAL HIGH (ref 70–99)
Glucose-Capillary: 125 mg/dL — ABNORMAL HIGH (ref 70–99)
Glucose-Capillary: 134 mg/dL — ABNORMAL HIGH (ref 70–99)
Glucose-Capillary: 148 mg/dL — ABNORMAL HIGH (ref 70–99)
Glucose-Capillary: 172 mg/dL — ABNORMAL HIGH (ref 70–99)
Glucose-Capillary: 183 mg/dL — ABNORMAL HIGH (ref 70–99)
Glucose-Capillary: 236 mg/dL — ABNORMAL HIGH (ref 70–99)

## 2024-08-11 LAB — BASIC METABOLIC PANEL WITH GFR
Anion gap: 11 (ref 5–15)
Anion gap: 13 (ref 5–15)
BUN: 21 mg/dL (ref 8–23)
BUN: 21 mg/dL (ref 8–23)
CO2: 22 mmol/L (ref 22–32)
CO2: 20 mmol/L — ABNORMAL LOW (ref 22–32)
Calcium: 7.8 mg/dL — ABNORMAL LOW (ref 8.9–10.3)
Calcium: 8.2 mg/dL — ABNORMAL LOW (ref 8.9–10.3)
Chloride: 103 mmol/L (ref 98–111)
Chloride: 104 mmol/L (ref 98–111)
Creatinine, Ser: 1.09 mg/dL (ref 0.61–1.24)
Creatinine, Ser: 1 mg/dL (ref 0.61–1.24)
GFR, Estimated: >60 mL/min (ref >60)
GFR, Estimated: >60 mL/min (ref >60)
Glucose, Bld: 159 mg/dL — ABNORMAL HIGH (ref 70–99)
Glucose, Bld: 148 mg/dL — ABNORMAL HIGH (ref 70–99)
Potassium: 4.1 mmol/L (ref 3.5–5.1)
Potassium: 4 mmol/L (ref 3.5–5.1)
Sodium: 136 mmol/L (ref 135–145)
Sodium: 137 mmol/L (ref 135–145)

## 2024-08-11 LAB — CBC
HCT: 31.7 % — ABNORMAL LOW (ref 39.0–52.0)
HCT: 34.6 % — ABNORMAL LOW (ref 39.0–52.0)
Hemoglobin: 10.4 g/dL — ABNORMAL LOW (ref 13.0–17.0)
Hemoglobin: 11.5 g/dL — ABNORMAL LOW (ref 13.0–17.0)
MCH: 28.5 pg (ref 26.0–34.0)
MCH: 28.5 pg (ref 26.0–34.0)
MCHC: 32.8 g/dL (ref 30.0–36.0)
MCHC: 33.2 g/dL (ref 30.0–36.0)
MCV: 86.8 fL (ref 80.0–100.0)
MCV: 85.9 fL (ref 80.0–100.0)
Platelets: 281 K/uL (ref 150–400)
Platelets: 368 K/uL (ref 150–400)
RBC: 3.65 MIL/uL — ABNORMAL LOW (ref 4.22–5.81)
RBC: 4.03 MIL/uL — ABNORMAL LOW (ref 4.22–5.81)
RDW: 14.8 % (ref 11.5–15.5)
RDW: 14.7 % (ref 11.5–15.5)
WBC: 13.4 K/uL — ABNORMAL HIGH (ref 4.0–10.5)
WBC: 19.1 K/uL — ABNORMAL HIGH (ref 4.0–10.5)
nRBC: 0 % (ref 0.0–0.2)
nRBC: 0 % (ref 0.0–0.2)

## 2024-08-11 LAB — MAGNESIUM: Magnesium: 2.5 mg/dL — ABNORMAL HIGH (ref 1.7–2.4)

## 2024-08-11 LAB — PHOSPHORUS: Phosphorus: 2.1 mg/dL — ABNORMAL LOW (ref 2.5–4.6)

## 2024-08-11 LAB — HEPARIN LEVEL (UNFRACTIONATED): Heparin Unfractionated: 0.52 [IU]/mL (ref 0.30–0.70)

## 2024-08-11 MED ORDER — EMPAGLIFLOZIN 25 MG PO TABS
25.0000 mg | ORAL_TABLET | Freq: Every day | ORAL | Status: DC
  Administered 2024-08-11 – 2024-08-14 (×4): 25 mg via ORAL
  Filled 2024-08-11 (×4): qty 1

## 2024-08-11 MED ORDER — LOSARTAN POTASSIUM 25 MG PO TABS
25.0000 mg | ORAL_TABLET | Freq: Every day | ORAL | Status: DC
  Administered 2024-08-11 – 2024-08-14 (×4): 25 mg via ORAL
  Filled 2024-08-11 (×4): qty 1

## 2024-08-11 MED ORDER — POTASSIUM & SODIUM PHOSPHATES 280-160-250 MG PO PACK
2.0000 | PACK | Freq: Two times a day (BID) | ORAL | Status: AC
  Administered 2024-08-11 (×2): 2 via ORAL
  Filled 2024-08-11 (×2): qty 2

## 2024-08-11 MED ORDER — FUROSEMIDE 10 MG/ML IJ SOLN
40.0000 mg | Freq: Two times a day (BID) | INTRAMUSCULAR | Status: DC
Start: 2024-08-11 — End: 2024-08-13
  Administered 2024-08-11 – 2024-08-13 (×5): 40 mg via INTRAVENOUS
  Filled 2024-08-11 (×5): qty 4

## 2024-08-11 MED ORDER — LEVALBUTEROL HCL 0.63 MG/3ML IN NEBU: 0.6300 mg | INHALATION_SOLUTION | Freq: Four times a day (QID) | RESPIRATORY_TRACT | Status: DC | PRN

## 2024-08-11 MED ORDER — APIXABAN 5 MG PO TABS
5.0000 mg | ORAL_TABLET | Freq: Two times a day (BID) | ORAL | Status: DC
  Administered 2024-08-11 – 2024-08-14 (×7): 5 mg via ORAL
  Filled 2024-08-11 (×8): qty 1

## 2024-08-11 MED ORDER — EMPAGLIFLOZIN 10 MG PO TABS: 10.0000 mg | ORAL_TABLET | Freq: Every day | ORAL | Status: DC

## 2024-08-11 MED ORDER — ROSUVASTATIN CALCIUM 20 MG PO TABS
20.0000 mg | ORAL_TABLET | Freq: Every day | ORAL | Status: DC
  Administered 2024-08-11 – 2024-08-14 (×4): 20 mg via ORAL
  Filled 2024-08-11 (×4): qty 1

## 2024-08-11 MED ORDER — METOPROLOL SUCCINATE ER 25 MG PO TB24
25.0000 mg | ORAL_TABLET | Freq: Every day | ORAL | Status: DC
  Administered 2024-08-11 – 2024-08-14 (×4): 25 mg via ORAL
  Filled 2024-08-11 (×4): qty 1

## 2024-08-11 MED ORDER — AMOXICILLIN-POT CLAVULANATE 875-125 MG PO TABS
1.0000 | ORAL_TABLET | Freq: Two times a day (BID) | ORAL | Status: AC
  Administered 2024-08-11 – 2024-08-13 (×6): 1 via ORAL
  Filled 2024-08-11 (×6): qty 1

## 2024-08-11 MED ORDER — COLCHICINE 0.6 MG PO TABS
0.6000 mg | ORAL_TABLET | Freq: Two times a day (BID) | ORAL | Status: DC
Start: 2024-08-11 — End: 2024-08-14
  Administered 2024-08-11 – 2024-08-14 (×7): 0.6 mg via ORAL
  Filled 2024-08-11 (×7): qty 1

## 2024-08-11 NOTE — Progress Notes (Signed)
 Progress Note    Jeffrey Black.   FMW:[id number]  DOB: August 04, 1961  DOA: 08/07/2024     4 PCP: [name], MD  Initial CC: fever, syncope   Hospital Course: Jeffrey Black is a 63 yo male with PMH psoriatic arthritis, DM II, history of alcohol  abuse who presented with fever, malaise, chest pain, syncope. Jeffrey Black was found to have refractory hypotension on admission requiring admission to the ICU and vasopressor support. CTA negative for dissection, minimal GGO in LLL, mediastinal/hilar lymphadenopathy and small pneumomediastinum. Reports decreased activity in the last 2 years. No recent travel. Lives alone. Has had multiple falls and balancing issues    Significant Hospital Events:  9/21 admit 9/22 pressor requirement improving, LE DVT neg 9/23 off pressors, trop trending down, still on [place] but feeling more short of breath this AM with sinus tach/ PAF, EF 55-60, mod pericardial effusion w/o tamponade, no RWMA  A&P:  Shock, undifferentiated  Acute Hypoxic Respiratory Failure Pneumomediastinum, resolved Mediastinal adenopathy Pericardial effusion Viral myocarditis  Lactic acidosis  - 9/21 CTA c/a/p for dissection w/wo> neg for aortic dissection or aneurysm, small pneumomediastinum, trace left pleural effusion, minimal LLL GGO, mediastinal and right hilar lymphadenopathy and periportal lymphadenopathy of uncertain etiology - LE DVT neg - Echo with preserved EF, but small to moderate pericardial effusion without tamponade  -cardiac MRI 9/23 concerning for acute myocarditis however some evidence of small apical inferior wall infarct, unclear if acute or old.  Unable to determine EF.   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 treated with  DES -Triple therapy for 1 month planned with Eliquis , aspirin , Plavix  then discontinuing aspirin    PAF - no longer on amio - S/p heparin  drip now transitioned to Eliquis  on 08/11/2024  Acute urinary retention - Patient endorses voiding adequately at home prior to admission but does endorse having a low stream at times -Catheter placed on admission likely in setting of acute illness but given clinical improvement, reasonable for a trial of void -Discontinue Foley on 08/11/2024 and start bladder scans; may need straight cath x 1 if high PVR and if continues then may need replacement of Foley   HTN - Continue Lasix  and Toprol  -Continue losartan    HA 9/21 CT CTA head/ neck neg - remains non- focal   Hyponatremia, resolved  - normalized, trend on labs    Hypophosphorous - Replete as needed  Interval History:  Resting in bed comfortable this morning.  Jeffrey Black is concerned about swelling in his legs.  Informed him possibly from all of the fluid since admission and should slowly improve.  No significant pain or erythema appreciated.   Old records reviewed in assessment of this patient  Antimicrobials: Zosyn  08/07/2024 >> 08/11/2024 Augmentin  08/11/2024 >> current  DVT prophylaxis:  SCDs Start: 08/07/24 1627 apixaban  (ELIQUIS ) tablet 5 mg   Code Status:   Code Status: Full Code  Mobility Assessment (Last 72 Hours)     Mobility Assessment     Row Name 08/11/24 0900 08/10/24 2000 08/10/24 1820 08/10/24 1614 08/10/24 0800   Does the patient have exclusion criteria? No - Perform mobility assessment No - Perform mobility assessment No - Perform mobility assessment -- No - Perform  mobility assessment   What is the highest level of mobility based on the mobility assessment? Level 2 (Chairfast) - Balance while sitting on edge of bed and cannot stand Level 2 (Chairfast) - Balance while sitting on edge of bed and cannot stand Level 1 (Bedfast) - Unable to balance while sitting on edge of bed Level  4 (Ambulates with assistance) - Balance while stepping forward/back - Complete Level 4 (Ambulates with assistance) - Balance while stepping forward/back - Complete   Is the above level different from baseline mobility prior to current illness? Yes - Recommend PT order Yes - Recommend PT order Yes - Recommend PT order -- --    Row Name 08/09/24 1457 08/09/24 0826 08/08/24 2000 08/08/24 1800     Does the patient have exclusion criteria? -- No - Perform mobility assessment No - Perform mobility assessment --    What is the highest level of mobility based on the mobility assessment? Level 4 (Ambulates with assistance) - Balance while stepping forward/back - Complete Level 4 (Ambulates with assistance) - Balance while stepping forward/back - Complete Level 4 (Ambulates with assistance) - Balance while stepping forward/back - Complete Level 4 (Ambulates with assistance) - Balance while stepping forward/back - Complete    Is the above level different from baseline mobility prior to current illness? -- Yes - Recommend PT order Yes - Recommend PT order --       Barriers to discharge: none Disposition Plan:  ?CIR HH orders placed: n/a Status is: Inpt  Objective: Blood pressure (!) 134/94, pulse 94, temperature 98.5 F (36.9 C), temperature source Oral, resp. rate 20, height 5' 11.5 (1.816 m), weight 96.8 kg, SpO2 99%.  Examination:  Physical Exam Constitutional:      General: Jeffrey Black is not in acute distress.    Appearance: Normal appearance.  HENT:     Head: Normocephalic and atraumatic.     Mouth/Throat:     Mouth: Mucous membranes are moist.  Eyes:     Extraocular Movements: Extraocular movements intact.  Cardiovascular:     Rate and Rhythm: Normal rate and regular rhythm.  Pulmonary:     Effort: Pulmonary effort is normal. No respiratory distress.     Breath sounds: Normal breath sounds. No wheezing.  Abdominal:     General: Bowel sounds are normal. There is no distension.     Palpations:  Abdomen is soft.     Tenderness: There is no abdominal tenderness.  Musculoskeletal:        General: Normal range of motion.     Cervical back: Normal range of motion and neck supple.     Right lower leg: Edema (1+) present.     Left lower leg: Edema (1+) present.  Skin:    General: Skin is warm and dry.  Neurological:     General: No focal deficit present.     Mental Status: Jeffrey Black is alert.  Psychiatric:        Mood and Affect: Mood normal.        Behavior: Behavior normal.      Consultants:  Cardiology Pulmonology  Procedures:  08/10/2024: Left heart cath  Data Reviewed: Results for orders placed or performed during the hospital encounter of 08/07/24 (from the past 24 hours)  Glucose, capillary     Status: Abnormal   Collection Time: 08/10/24  3:24 PM  Result Value Ref Range   Glucose-Capillary 111 (H) 70 - 99 mg/dL  POCT Activated clotting time     Status: None  Collection Time: 08/10/24  5:29 PM  Result Value Ref Range   Activated Clotting Time 268 seconds  POCT Activated clotting time     Status: None   Collection Time: 08/10/24  5:51 PM  Result Value Ref Range   Activated Clotting Time 233 seconds  Glucose, capillary     Status: Abnormal   Collection Time: 08/10/24  8:19 PM  Result Value Ref Range   Glucose-Capillary 142 (H) 70 - 99 mg/dL  Blood gas, venous     Status: Abnormal   Collection Time: 08/10/24 10:11 PM  Result Value Ref Range   pH, Ven 7.4 7.25 - 7.43   pCO2, Ven 34 (L) 44 - 60 mmHg   pO2, Ven 48 (H) 32 - 45 mmHg   Bicarbonate 21.1 20.0 - 28.0 mmol/L   Acid-base deficit 3.0 (H) 0.0 - 2.0 mmol/L   O2 Saturation 80.5 %   Patient temperature 36.9   Lactic acid, plasma     Status: None   Collection Time: 08/10/24 10:16 PM  Result Value Ref Range   Lactic Acid, Venous 1.4 0.5 - 1.9 mmol/L  Glucose, capillary     Status: Abnormal   Collection Time: 08/11/24 12:35 AM  Result Value Ref Range   Glucose-Capillary 183 (H) 70 - 99 mg/dL  CBC     Status:  Abnormal   Collection Time: 08/11/24  2:31 AM  Result Value Ref Range   WBC 13.4 (H) 4.0 - 10.5 K/uL   RBC 3.65 (L) 4.22 - 5.81 MIL/uL   Hemoglobin 10.4 (L) 13.0 - 17.0 g/dL   HCT 68.2 (L) 60.9 - 47.9 %   MCV 86.8 80.0 - 100.0 fL   MCH 28.5 26.0 - 34.0 pg   MCHC 32.8 30.0 - 36.0 g/dL   RDW 85.1 88.4 - 84.4 %   Platelets 281 150 - 400 K/uL   nRBC 0.0 0.0 - 0.2 %  Magnesium      Status: Abnormal   Collection Time: 08/11/24  2:31 AM  Result Value Ref Range   Magnesium  2.5 (H) 1.7 - 2.4 mg/dL  Phosphorus     Status: Abnormal   Collection Time: 08/11/24  2:31 AM  Result Value Ref Range   Phosphorus 2.1 (L) 2.5 - 4.6 mg/dL  Basic metabolic panel with GFR     Status: Abnormal   Collection Time: 08/11/24  2:31 AM  Result Value Ref Range   Sodium 136 135 - 145 mmol/L   Potassium 4.1 3.5 - 5.1 mmol/L   Chloride 103 98 - 111 mmol/L   CO2 22 22 - 32 mmol/L   Glucose, Bld 159 (H) 70 - 99 mg/dL   BUN 21 8 - 23 mg/dL   Creatinine, Ser 8.90 0.61 - 1.24 mg/dL   Calcium  7.8 (L) 8.9 - 10.3 mg/dL   GFR, Estimated >39 >39 mL/min   Anion gap 11 5 - 15  Glucose, capillary     Status: Abnormal   Collection Time: 08/11/24  5:45 AM  Result Value Ref Range   Glucose-Capillary 134 (H) 70 - 99 mg/dL  Glucose, capillary     Status: Abnormal   Collection Time: 08/11/24  7:48 AM  Result Value Ref Range   Glucose-Capillary 125 (H) 70 - 99 mg/dL  Heparin  level (unfractionated)     Status: None   Collection Time: 08/11/24  8:29 AM  Result Value Ref Range   Heparin  Unfractionated 0.52 0.30 - 0.70 IU/mL  Basic metabolic panel     Status: Abnormal  Collection Time: 08/11/24  8:29 AM  Result Value Ref Range   Sodium 137 135 - 145 mmol/L   Potassium 4.0 3.5 - 5.1 mmol/L   Chloride 104 98 - 111 mmol/L   CO2 20 (L) 22 - 32 mmol/L   Glucose, Bld 148 (H) 70 - 99 mg/dL   BUN 21 8 - 23 mg/dL   Creatinine, Ser 8.99 0.61 - 1.24 mg/dL   Calcium  8.2 (L) 8.9 - 10.3 mg/dL   GFR, Estimated >39 >39 mL/min    Anion gap 13 5 - 15  CBC     Status: Abnormal   Collection Time: 08/11/24  8:29 AM  Result Value Ref Range   WBC 19.1 (H) 4.0 - 10.5 K/uL   RBC 4.03 (L) 4.22 - 5.81 MIL/uL   Hemoglobin 11.5 (L) 13.0 - 17.0 g/dL   HCT 65.3 (L) 60.9 - 47.9 %   MCV 85.9 80.0 - 100.0 fL   MCH 28.5 26.0 - 34.0 pg   MCHC 33.2 30.0 - 36.0 g/dL   RDW 85.2 88.4 - 84.4 %   Platelets 368 150 - 400 K/uL   nRBC 0.0 0.0 - 0.2 %  Glucose, capillary     Status: Abnormal   Collection Time: 08/11/24 11:42 AM  Result Value Ref Range   Glucose-Capillary 148 (H) 70 - 99 mg/dL    I have reviewed pertinent nursing notes, vitals, labs, and images as necessary. I have ordered labwork to follow up on as indicated.  I have reviewed the last notes from staff over past 24 hours. I have discussed patient's care plan and test results with nursing staff, CM/SW, and other staff as appropriate.  Time spent: Greater than 50% of the 55 minute visit was spent in counseling/coordination of care for the patient as laid out in the A&P.   LOS: 4 days   Alm Apo, MD [facility] 08/11/2024, 12:03 PM

## 2024-08-11 NOTE — Discharge Instructions (Signed)

## 2024-08-11 NOTE — Progress Notes (Signed)
 Patient voided 800. Post void bladder scan showed 500-600. I&O ordered. Patient voided again on his own 400 out clear, yellow, no pain. Will bladder scan again at 0001.  Patient does report that he has had trouble starting his stream and trouble voiding in general since February. He has reported this to his family doctor, who did some work up initially with plan to follow with patient in November. Patient does illicit condition has gotten worse since rather than better.

## 2024-08-11 NOTE — Progress Notes (Signed)
 PHARMACY - ANTICOAGULATION CONSULT NOTE  Pharmacy Consult for heparin > apixaban  Indication: chest pain/ACS  No Known Allergies  Patient Measurements: Height: 5' 11.5 (181.6 cm) Weight: 96.8 kg (213 lb 6.5 oz) IBW/kg (Calculated) : 76.44 HEPARIN  DW (KG): 96.1  Vital Signs: Temp: 98.1 F (36.7 C) (09/25 0744) Temp Source: Oral (09/25 0744) BP: 136/95 (09/25 0744) Pulse Rate: 80 (09/25 0744)  Labs: Recent Labs    08/08/24 1730 08/09/24 0416 08/09/24 1236 08/09/24 1443 08/10/24 0021 08/11/24 0231 08/11/24 0829  HGB  --    < >  --   --  10.3* 10.4* 11.5*  HCT  --    < >  --   --  30.4* 31.7* 34.6*  PLT  --    < >  --   --  220 281 368  HEPARINUNFRC <0.10*   < > 0.30  --  0.31  --  0.52  CREATININE  --    < >  --   --  0.96 1.09 1.00  TROPONINIHS 1,143*  --   --  1,076*  --   --   --    < > = values in this interval not displayed.    Estimated Creatinine Clearance: 90.5 mL/min (by C-G formula based on SCr of 1 mg/dL).   Assessment: 43 YOM presenting with shock, pericardial pain and s/p cath with DES. He is on heparin  for afib and plans to transition to apixaban  (he is noted on ASA and plavix )  -Hg= 11.5, SCr 1.0  Goal of Therapy:  Heparin  level 0.3-0.7 units/ml Monitor platelets by anticoagulation protocol: Yes   Plan:  -Stop heparin   -start apixaban  5mg  po bid -plans noted for 1 month ASA  Prentice Poisson, PharmD Clinical Pharmacist **Pharmacist phone directory can now be found on [email or web address] ([email or web address]).  Listed under [facility].  Prentice Poisson, PharmD Clinical Pharmacist **Pharmacist phone directory can now be found on [email or web address] ([email or web address]).  Listed under [facility].

## 2024-08-11 NOTE — Progress Notes (Signed)
 Pt was educated on myocarditis, stent card, stent location, triple therapy for a-fib, wt restrictions, no baths/daily wash-ups, s/s of infection, ex guidelines, s/s to stop exercising, NTG use and calling 911, heart healthy and diabetic diet, risk factors and CRPII. Pt received materials on exercise, diet, and CRPII. Will refer to [facility].   [phone]  Garen FORBES Candy MS, ACSM-CEP 08/11/2024 10:33 AM

## 2024-08-11 NOTE — Progress Notes (Signed)
 Physical Therapy Treatment Patient Details Name: Jeffrey Black. MRN: [id number] DOB: Oct 26, 1961 Today's Date: 08/11/2024   History of Present Illness Pt is 63 year old presented to [facility] on  08/07/24 for chest pain and syncopal episodes. Pt hypotensive and started on pressors. Pt with acute hypoxemic respiratory failure secondary to URI and septic shock. Chest pain was pleuritic and CT showed pneumomediastinum. PMH - DM, psoriasis, etoh use in remission    PT Comments  Patient progressing with mobility and stability.  Still SOB though on 2-3L with SpO2 90's throughout ambulation.  Still reports weakness, though states his sister who has been visiting her daughter and grandchild in [place]  on the way home and plans to help out initially.  States has hospital bed, full bathroom and lift chair on the first floor from when his dad was sick.  Feel patient should continue to progress with mobility and be stable for home when medically ready with family support and HHPT (if he can get charity care).     If plan is discharge home, recommend the following: A little help with walking and/or transfers;Help with stairs or ramp for entrance;Assist for transportation;Assistance with cooking/housework   Can travel by private vehicle        Equipment Recommendations  None recommended by PT    Recommendations for Other Services       Precautions / Restrictions Precautions Precautions: Fall Precaution/Restrictions Comments: watch HR and O2     Mobility  Bed Mobility               General bed mobility comments: in recliner    Transfers Overall transfer level: Needs assistance Equipment used: Rolling walker (2 wheels) Transfers: Sit to/from Stand Sit to Stand: Supervision           General transfer comment: assist for lines, cues for pushing up from chair    Ambulation/Gait Ambulation/Gait assistance: Supervision, Contact guard assist Gait Distance (Feet): 400 Feet Assistive  device: Rollator (4 wheels) Gait Pattern/deviations: Step-through pattern, Step-to pattern       General Gait Details: assist for safety on turns and one LOB in room around bed with close S to CGA for recovery, reports feet swollen and a little more off balance than usual   Stairs             Wheelchair Mobility     Tilt Bed    Modified Rankin (Stroke Patients Only)       Balance Overall balance assessment: Needs assistance Sitting-balance support: Feet supported Sitting balance-Leahy Scale: Good     Standing balance support: During functional activity, Single extremity supported Standing balance-Leahy Scale: Poor Standing balance comment: cues for safety with turning to sit on rollator with one hand supported when moving feet                            Communication Communication Communication: No apparent difficulties  Cognition Arousal: Alert Behavior During Therapy: WFL for tasks assessed/performed   PT - Cognitive impairments: No apparent impairments                         Following commands: Intact      Cueing Cueing Techniques: Verbal cues  Exercises      General Comments General comments (skin integrity, edema, etc.): pt left on 2L thorughout, >90%      Pertinent Vitals/Pain Pain Assessment Pain Assessment: No/denies pain  Home Living                          Prior Function            PT Goals (current goals can now be found in the care plan section) Progress towards PT goals: Progressing toward goals    Frequency    Min 3X/week      PT Plan      Co-evaluation              AM-PAC PT 6 Clicks Mobility   Outcome Measure  Help needed turning from your back to your side while in a flat bed without using bedrails?: A Little Help needed moving from lying on your back to sitting on the side of a flat bed without using bedrails?: A Little Help needed moving to and from a bed to a chair  (including a wheelchair)?: A Little Help needed standing up from a chair using your arms (e.g., wheelchair or bedside chair)?: A Little Help needed to walk in hospital room?: A Little Help needed climbing 3-5 steps with a railing? : A Little 6 Click Score: 18    End of Session Equipment Utilized During Treatment: Gait belt;Oxygen Activity Tolerance: Patient tolerated treatment well Patient left: in chair;with call bell/phone within reach   PT Visit Diagnosis: Unsteadiness on feet (R26.81);Muscle weakness (generalized) (M62.81);Other abnormalities of gait and mobility (R26.89)     Time: 8881-8857 PT Time Calculation (min) (ACUTE ONLY): 24 min  Charges:    $Gait Training: 8-22 mins $Self Care/Home Management: 8-22 PT General Charges $$ ACUTE PT VISIT: 1 Visit                     Micheline Portal, PT Acute Rehabilitation Services [phone] 08/11/2024    [name] 08/11/2024, 1:33 PM

## 2024-08-11 NOTE — Progress Notes (Signed)
 Occupational Therapy Treatment Patient Details Name: Jeffrey Black. MRN: [id number] DOB: [birth year] Today's Date: 08/11/2024   History of present illness Pt is 63 year old presented to [facility] on  08/07/24 for chest pain and syncopal episodes. Pt hypotensive and started on pressors. Pt with acute hypoxemic respiratory failure secondary to URI and septic shock. Chest pain was pleuritic and CT showed pneumomediastinum. PMH - DM, psoriasis, etoh use in remission   OT comments  Pt is making steady progress towards their acute OT goals. Pt continues to be SOB at rest which worsens with activity. Pt required cues for PLB and pacing during functional tasks, he continues to have limited insight to deficits and self management of DOE.  He did mobilize with superivsion A with RW and tolerated ~10 minutes of OOB tasks. 2L maintained throughout with SpO2 >90%. OT to continue to follow acutely to facilitate progress towards established goals. Pt will continue to benefit from intensive inpatient follow up therapy, >3 hours/day after discharge.        If plan is discharge home, recommend the following:  A little help with walking and/or transfers;A little help with bathing/dressing/bathroom;Assistance with cooking/housework;Assist for transportation;Help with stairs or ramp for entrance   Equipment Recommendations  None recommended by OT    Recommendations for Other Services Rehab consult    Precautions / Restrictions Precautions Precautions: Fall Precaution/Restrictions Comments: watch HR and O2 Restrictions Weight Bearing Restrictions Per Provider Order: No       Mobility Bed Mobility               General bed mobility comments: in recliner    Transfers Overall transfer level: Needs assistance Equipment used: Rolling walker (2 wheels) Transfers: Sit to/from Stand Sit to Stand: Supervision                 Balance Overall balance assessment: Needs assistance Sitting-balance  support: Feet supported Sitting balance-Leahy Scale: Good     Standing balance support: During functional activity, Single extremity supported Standing balance-Leahy Scale: Poor                             ADL either performed or assessed with clinical judgement   ADL Overall ADL's : Needs assistance/impaired Eating/Feeding: Set up;Sitting   Grooming: Supervision/safety;Standing Grooming Details (indicate cue type and reason): supervision for safety, PLB cues                 Toilet Transfer: Supervision/safety;Ambulation;Rolling walker (2 wheels)           Functional mobility during ADLs: Supervision/safety;Rolling walker (2 wheels) General ADL Comments: continues to be limited by SOB, pt with limited insight to deficits and needs cues for sysmptom management, PLB and safety    Extremity/Trunk Assessment Upper Extremity Assessment Upper Extremity Assessment: Generalized weakness   Lower Extremity Assessment Lower Extremity Assessment: Defer to PT evaluation        Vision   Vision Assessment?: No apparent visual deficits;Wears glasses for reading   Perception Perception Perception: Not tested   Praxis Praxis Praxis: Not tested   Communication Communication Communication: No apparent difficulties   Cognition Arousal: Alert Behavior During Therapy: WFL for tasks assessed/performed Cognition: No apparent impairments             OT - Cognition Comments: overall WFL for functional tasks, pt does need cues for symptom management, pacing, and safety  Following commands: Intact        Cueing   Cueing Techniques: Verbal cues        General Comments pt left on 2L thorughout, >90%    Pertinent Vitals/ Pain       Pain Assessment Pain Assessment: No/denies pain   Frequency  Min 2X/week        Progress Toward Goals  OT Goals(current goals can now be found in the care plan section)  Progress towards OT goals:  Progressing toward goals  Acute Rehab OT Goals Patient Stated Goal: to get better OT Goal Formulation: With patient Time For Goal Achievement: 08/23/24 Potential to Achieve Goals: Good ADL Goals Pt Will Perform Grooming: with modified independence;standing Pt Will Perform Lower Body Dressing: with modified independence;sit to/from stand Pt Will Transfer to Toilet: with modified independence;ambulating;regular height toilet Additional ADL Goal #1: Pt will complete at least 8 minutes of OOB functional activity to demonstrated improved tolerance for ADLs Additional ADL Goal #2: Pt will indep recall the 5 P's of energy conservation to improve activity tolerance at discharge   AM-PAC OT 6 Clicks Daily Activity     Outcome Measure   Help from another person eating meals?: None Help from another person taking care of personal grooming?: A Little Help from another person toileting, which includes using toliet, bedpan, or urinal?: A Little Help from another person bathing (including washing, rinsing, drying)?: A Lot Help from another person to put on and taking off regular upper body clothing?: A Little Help from another person to put on and taking off regular lower body clothing?: A Lot 6 Click Score: 17    End of Session Equipment Utilized During Treatment: Rolling walker (2 wheels);Oxygen  OT Visit Diagnosis: Unsteadiness on feet (R26.81);Other abnormalities of gait and mobility (R26.89);Muscle weakness (generalized) (M62.81)   Activity Tolerance Patient tolerated treatment well   Patient Left in chair;with call bell/phone within reach   Nurse Communication Mobility status        Time: 8975-8947 OT Time Calculation (min): 28 min  Charges: OT General Charges $OT Visit: 1 Visit OT Treatments $Self Care/Home Management : 23-37 mins  Lucie Kendall, OTR/L Acute Rehabilitation Services Office [phone] Secure Chat Communication Preferred   Lucie JONETTA Kendall 08/11/2024,  1:14 PM

## 2024-08-11 NOTE — Progress Notes (Signed)
 Removed patients foley cath with no complications, will continue to monitor

## 2024-08-11 NOTE — Progress Notes (Signed)
 Mobility Specialist Progress Note;   08/11/24 0926  Mobility  Activity Pivoted/transferred from bed to chair  Level of Assistance Contact guard assist, steadying assist  Assistive Device Front wheel walker  Distance Ambulated (ft) 5 ft  Activity Response Tolerated well  Mobility Referral Yes  Mobility visit 1 Mobility  Mobility Specialist Start Time (ACUTE ONLY) [id number]  Mobility Specialist Stop Time (ACUTE ONLY) 0940  Mobility Specialist Time Calculation (min) (ACUTE ONLY) 14 min   Pt eager for OOB mobility. On 2LO2 upon arrival. Required MinG assistance to safely transfer pt from bed to chair. Fluctuated on 2-3LO2 while mobilizing. Audible wheezing once sitting up, SPO2 WFL. Pt left in chair with all needs met, call bell in reach.   Lauraine Erm Mobility Specialist Please contact via SecureChat or [employer] [phone]

## 2024-08-11 NOTE — Progress Notes (Signed)
 Will be the 2 mg   Progress Note  Patient Name: Jeffrey Black. Date of Encounter: 08/11/2024  [facility] Cardiologist: Wilbert Bihari, MD   Patient Profile     Subjective    2D echo showed EF 55 to 60% with no focal wall motion abnormalities and G1 DD as well as small to moderate pericardial effusion  cMRI showed possible myopericarditis as well as possible inferior infarct  Yesterday showed 65% distal RCA with a focal ulcerated plaque and 10% stenosed RPA V sidebranch status post PCI of the distal RCA with residual disease of 20% in the mid LAD and sidebranch of D2 with severe the elevated LVEDP and given dose of IV Lasix .    UOP 4.2 L yesterday and net +1.9 L since admission  Denies any chest pain.  Shortness of breath still present  No further A-fib on Amio drip  Started on IV Solu-Medrol  for URI with ongoing wheezing  Inpatient Medications    Scheduled Meds:  aspirin   81 mg Oral Daily   budesonide  (PULMICORT ) nebulizer solution  0.5 mg Nebulization BID   Chlorhexidine  Gluconate Cloth  6 each Topical Daily   clopidogrel   300 mg Oral Once   [START ON 08/12/2024] clopidogrel   75 mg Oral Q breakfast   free water   250 mL Oral Once   guaiFENesin   600 mg Oral BID   insulin  aspart  0-9 Units Subcutaneous Q4H   levalbuterol   0.63 mg Nebulization Q6H   methylPREDNISolone  (SOLU-MEDROL ) injection  40 mg Intravenous Daily   nicotine   14 mg Transdermal Daily   pantoprazole  (PROTONIX ) IV  40 mg Intravenous QHS   potassium & sodium phosphates   2 packet Oral BID   revefenacin   175 mcg Nebulization Daily   sodium chloride  flush  3 mL Intravenous Q12H   Continuous Infusions:  sodium chloride      amiodarone  30 mg/hr (08/11/24 0038)   heparin  2,600 Units/hr (08/11/24 0040)   piperacillin -tazobactam (ZOSYN )  IV 3.375 g (08/11/24 0626)   PRN Meds: sodium chloride , acetaminophen , docusate sodium , HYDROmorphone  (DILAUDID ) injection, lip balm, ondansetron  (ZOFRAN ) IV, mouth  rinse, polyethylene glycol, sodium chloride  flush   Vital Signs    Vitals:   08/11/24 0400 08/11/24 0500 08/11/24 0600 08/11/24 0744  BP: 120/74 109/80  (!) 136/95  Pulse: 69 73  80  Resp:  18  18  Temp:  98 F (36.7 C)  98.1 F (36.7 C)  TempSrc:  Oral  Oral  SpO2: 94% 94%  98%  Weight:   96.8 kg   Height:        Intake/Output Summary (Last 24 hours) at 08/11/2024 0827 Last data filed at 08/11/2024 0547 Gross per 24 hour  Intake 1560.93 ml  Output 3900 ml  Net -2339.07 ml      08/11/2024    6:00 AM 08/10/2024    4:22 AM 08/09/2024    4:22 AM  Last 3 Weights  Weight (lbs) 213 lb 6.5 oz 214 lb 15.2 oz 210 lb 5.1 oz  Weight (kg) 96.8 kg 97.5 kg 95.4 kg      Telemetry    Normal sinus rhythm- Personally Reviewed  ECG    Normal sinus rhythm with low voltage QRS in the limb leads and prolonged QTc at - Personally Reviewed  Physical Exam   GEN: Well nourished, well developed in no acute distress HEENT: Normal NECK: No JVD; No carotid bruits LYMPHATICS: No lymphadenopathy CARDIAC:RRR, no murmurs, rubs, gallops RESPIRATORY:  diffuse expiratory wheezes ABDOMEN: Soft,  non-tender, non-distended MUSCULOSKELETAL:  trace BLE edema; No deformity  SKIN: Warm and dry NEUROLOGIC:  Alert and oriented x 3 PSYCHIATRIC:  Normal affect  Labs    High Sensitivity Troponin:   Recent Labs  Lab 08/07/24 1529 08/07/24 1740 08/08/24 1022 08/08/24 1730 08/09/24 1443  TROPONINIHS 1,350* 1,336* 2,114* 1,143* 1,076*      Chemistry Recent Labs  Lab 08/07/24 1529 08/07/24 1533 08/09/24 0416 08/10/24 0021 08/11/24 0231  NA 130*   < > 133* 135 136  K 4.3   < > 4.3 3.9 4.1  CL 99   < > 103 104 103  CO2 19*   < > 19* 19* 22  GLUCOSE 163*   < > 114* 145* 159*  BUN 14   < > 21 22 21   CREATININE 1.21   < > 0.97 0.96 1.09  CALCIUM  8.7*   < > 8.2* 7.9* 7.8*  PROT 5.7*  --   --   --   --   ALBUMIN  2.9*  --   --   --   --   AST 27  --   --   --   --   ALT 40  --   --   --   --    ALKPHOS 40  --   --   --   --   BILITOT 0.5  --   --   --   --   GFRNONAA >60   < > >60 >60 >60  ANIONGAP 12   < > 11 12 11    < > = values in this interval not displayed.     Hematology Recent Labs  Lab 08/09/24 0416 08/10/24 0021 08/11/24 0231  WBC 13.8* 14.2* 13.4*  RBC 3.83* 3.54* 3.65*  HGB 10.9* 10.3* 10.4*  HCT 33.2* 30.4* 31.7*  MCV 86.7 85.9 86.8  MCH 28.5 29.1 28.5  MCHC 32.8 33.9 32.8  RDW 14.5 14.7 14.8  PLT 227 220 281    BNP Recent Labs  Lab 08/07/24 1529 08/09/24 1443  BNP 190.5* 436.5*     DDimer No results for input(s): DDIMER in the last 168 hours.   Radiology    DG Chest Port 1 View Result Date: 08/11/2024 EXAM: 1 VIEW(S) XRAY OF THE CHEST 08/11/2024 07:34:00 AM COMPARISON: 08/09/2024 CLINICAL HISTORY: Hypoxia FINDINGS: LUNGS AND PLEURA: Small left pleural effusion. Increased diffuse interstitial markings with increased left retrocardiac opacity. No pulmonary edema. No pneumothorax. HEART AND MEDIASTINUM: Cardiomegaly. BONES AND SOFT TISSUES: No acute osseous abnormality. IMPRESSION: 1. Increased left retrocardiac opacity, which may reflect atelectasis or developing consolidation. 2. Diffuse increased interstitial markings concerning for pulmonary edema. 3. Small left pleural effusion. 4. Cardiomegaly. Electronically signed by: Waddell Calk MD 08/11/2024 08:16 AM EDT RP Workstation: [id number]   CARDIAC CATHETERIZATION Result Date: 08/10/2024 Table formatting from the original result was not included. Images from the original result were not included.   Likely culprit lesion: Dist RCA lesion is 65% stenosed (focal ulcerated plaque) with 10% stenosed RPA V side branch in RPAV.   A drug-eluting stent was successfully placed from a branch into the RPA V sidebranch (across the RPDA) using a STENT SYNERGY XD 3.50X20 in the main branch and side branch.  Stent was deployed at 3.8 mm and postdilated to 4.1 mm in the main branch.   Post intervention, there is a  0% residual stenosis. Post intervention, the side branch was reduced to 0% residual stenosis.  TIMI-3 flow restored.   --------------------------------------------------------------------------- -----  Mid LAD lesion is 20% stenosed with 20% stenosed side branch in 2nd Diag.   --------------------------------------------------------------------------- -----   LV end diastolic pressure is severely elevated.   There is no aortic valve stenosis. Diagnostic: Dominance: Right                                                          Intervention  Likely culprit lesion for MI (with inferior MI seen on MRI) is a ulcerated plaque (65%) in the mid RCA just proximal to a high bifurcation into PDA and PAV.   Otherwise the rest of the RCA with several PL branches and PDA are free of disease. Successful DES PCI of 65% ulcerated plaque mid RCA stenting from RCA into RPA V with 3.5 mm x 20 mm Synergy XD deployed 3.8 mm, and postdilated proximally to 4.1 mm upstream of the bifurcation.  The stent was fully expanded however there still remains a small ledge of the plaque outside of the stent but notable improvement of flow. = Lesion essentially reduced to 0%> LCA: Other than 20% mid LAD at D1 takeoff, angiographically minimal disease.  1 small 1 moderate caliber diagonal branch with extensive bifurcation.  LCx terminates as an OM1-OM2 with an AV groove branch and small posterolateral branch. Severely  elevated LVEDP of roughly 30 mmHg consistent with Acute Diastolic Heart Failure Treated with 40 mg IV Lasix  in the Cath Lab.  RECOMMENDATIONS   The patient was loaded with ticagrelor /Brilinta  in the Cath Lab, but it became known that he would likely require DOAC and therefore the plan is to reload with Plavix /clopidogrel  the morning of 08/11/2024 and then continue Plavix  75 mg daily from then on   Recommend to resume undecided DOAC, at currently prescribed dose and frequency on 08/11/2024.   Recommend concurrent antiplatelet therapy of  Aspirin  81 mg for 1 month and Clopidogrel  75mg  daily for 6 months  If there is increased bleeding risk concern, could consider shortening aspirin  to 2 weeks .   Due to significant elevated LVEDP, he was given 40 mg IV Lasix  in the Cath Lab, would reassess volume status in the morning and strongly consider additional diuresis. PLAN OF CARE: Continue ongoing care for URI and likely myocarditis.  I suspect that the MRI and RCA lesion were incidental findings.  I have written to convert from Brilinta  to clopidogrel  (loading dose 300 mg morning 08/11/2024 and then 25 mg daily after that, provided that the plan is to place the patient on a DOAC.  With elevated EDP of roughly 30 mmHg, would likely consider additional diuresis (he was given 40 mg IV Lasix  in the Cath Lab and would assess in the morning.  Once stable from a cardiovascular standpoint, can begin to titrate GDMT.  Alm Clay  MR CARDIAC MORPHOLOGY W WO CONTRAST Result Date: 08/09/2024 CLINICAL DATA:  63M p/w shock, chest pain, troponin elevated to 2114. Echo with EF 50-55%, moderate pericardial effusion. EXAM: CARDIAC MRI TECHNIQUE: The patient was scanned on a 1.5 Tesla Siemens magnet. A dedicated cardiac coil was used. Functional imaging was done using Fiesta sequences. 2,3, and 4 chamber views were done to assess for RWMA's. Modified Simpson's rule using a short axis stack was used to calculate an ejection fraction on a dedicated work [employer]. The patient received 10  cc of Gadavist . After 10 minutes inversion recovery sequences were used to assess for infiltration and scar tissue. Phase contrast velocity mapping was performed CONTRAST:  10 cc  of Gadavist  FINDINGS: Left ventricle: -Cine images were not interpretable due to artifact from cardiac/respiratory motion. Unable to calculate LV volumes and EF -Subendocardial LGE in apical inferior wall consistent with small infarct -Elevated myocardial T1/T2/ECV values consistent with  myocardial edema. -RV insertion site LGE, which can be seen in setting of elevated pulmonary pressures Right ventricle: Cine images were not interpretable due to artifact from cardiac/respiratory motion. 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 NO pericardial LGE IMPRESSION: 1. Cine images were not interpretable due to artifact from cardiac/respiratory motion. Unable to calculate LV/RV volumes and EF 2. Subendocardial LGE in apical inferior wall consistent with small infarct 3. Elevated myocardial T1/T2/ECV values consistent with myocardial edema 4. Moderate pericardial effusion measuring up to 1.5cm adjacent to LV inferior wall 5.  Dilated ascending aorta measuring 40mm 6. Dilated main pulmonary artery measuring 34mm. RV insertion site LGE, which can be seen in setting of elevated pulmonary pressures 7. Clinical presentation suggests myocarditis, and meets criteria for acute myocarditis on CMR with regional elevation in T1/T2/ECV levels. However, given LGE imaging shows evidence of small infarct in apical inferior wall, would be important to clarify if this is an acute or old infarct. Recommend cardiac catheterization. Given elevated T1/T2 levels are not just located around the infarct, but in multiple regions throughout the LV, suspect more likely myocarditis but important to rule out acute infarct Electronically Signed   By: Lonni Nanas M.D.   On: 08/09/2024 22:43   ECHOCARDIOGRAM LIMITED Result Date: 08/09/2024    ECHOCARDIOGRAM LIMITED REPORT   Patient Name:   Jeffrey Black. Date of Exam: 08/09/2024 Medical Rec #:  [id number]            Height:       71.5 in Accession #:    [id number]           Weight:        210.3 lb Date of Birth:  1961-07-17             BSA:          2.165 m Patient Age:    63 years             BP:           93/70 mmHg Patient Gender: M                    HR:           84 bpm. Exam Location:  Inpatient Procedure: Limited Echo and Cardiac Doppler (Both Spectral and Color Flow            Doppler were utilized during procedure). Indications:    Pericardial Effusion I31.3  History:        Patient has prior history of Echocardiogram examinations, most                 recent 08/08/2024.  Sonographer:    Tinnie Gosling RDCS Referring Phys: [phone] Cindia Hustead R Alias Villagran IMPRESSIONS  1. Left ventricular ejection fraction, by estimation, is 50 to 55%. The left ventricle has low  normal function. There is mild concentric left ventricular hypertrophy.  2. Right ventricular systolic function is mildly reduced. Tricuspid regurgitation signal is inadequate for assessing PA pressure.  3. Moderate pericardial effusion. The pericardial effusion is circumferential. There is no evidence of cardiac tamponade.  4. The inferior vena cava is dilated in size with <50% respiratory variability, suggesting right atrial pressure of 20 mmHg. Comparison(s): No significant change from prior study. Prior images reviewed side by side. FINDINGS  Left Ventricle: Left ventricular ejection fraction, by estimation, is 50 to 55%. The left ventricle has low normal function. The left ventricular internal cavity size was normal in size. There is mild concentric left ventricular hypertrophy. Right Ventricle: Right ventricular systolic function is mildly reduced. Tricuspid regurgitation signal is inadequate for assessing PA pressure. Pericardium: A moderately sized pericardial effusion is present. The pericardial effusion is circumferential. There is no evidence of cardiac tamponade. Venous: The inferior vena cava is dilated in size with less than 50% respiratory variability, suggesting right atrial pressure of 15 mmHg. LEFT VENTRICLE PLAX 2D LVIDd:          4.60 cm LVIDs:         2.80 cm LV PW:         1.20 cm LV IVS:        1.20 cm  IVC IVC diam: 2.90 cm Stanly Leavens MD Electronically signed by Stanly Leavens MD Signature Date/Time: 08/09/2024/11:43:30 AM    Final     Patient Profile     63 y.o. male with a hx of psoriasis, hepatic steatosis, tobacco use, and EtOH use (quit 2016) who is being seen 08/07/2024 for the evaluation of chest pain and elevated troponin.   Assessment & Plan    Chest pain Elevated troponin/NSTEMI ASCAD Hypotension Acute Myocarditis -Patient presented with hypotension and shock felt likely related to sepsis -Chest pain pleuritic and CT showed pneumomediastinum -Elevated high-sensitivity troponins (1350>> 1336>> 2114>> 1143) -No ST changes on EKG -2D echo: Normal LV function EF 55 to 60%, moderate LVH, G1 DD, mild RV dysfunction and small to moderate pericardial effusion - Differential diagnosis for elevated troponin includes type II NSTEMI from shock/hypotension with demand ischemia but given fever, chills and night sweats  and pericardial effusion could also be consistent with acute viral myopericarditis.  Unlikely to be ACS in the setting of normal LV function - He does have cardiac risk factors include diabetes, hypertension, ongoing tobacco abuse and family history of heart disease - repeat echo yesterday with moderate pericardial effusion - cMRI showed SE LGE in apical inferior wall c/w small infarct but also evidence of myocardial edema and moderate pericardial effusion>>meets criteria for acute myocarditis but also ? Inferior infarct age undetermined -LHC 08/10/2024: 65% distal LAD with 10% ostial R PLA status post PCI of the distal RCA with residual stenosis of 20% mid LAD and ostial D2 -Loaded with Brilinta  with recommendations to transition to Plavix  75 mg daily today as he will need DOAC therapy as well for his A-fib -Plan triple therapy for 1 month and then stop aspirin  81 mg daily -Continue  Plavix  300 mg load this morning and then 75 mg daily starting tomorrow and aspirin  81 mg daily - Hypotension has resolved so we will start Toprol  XL 25 mg daily - PTA was on Crestor  5 mg daily>> increase to 20 mg daily - Will need FLP and ALT in 6 weeks - still need to treat for additional viral myocarditis will start Colchicine  0.6mg  BID for myocarditis  Hypertension - Presented with hypotension in the setting of shock - BP improved with IV fluids and control of afib with RVR - Now hypertensive with BP 141/100 mmHg this morning - Starting Toprol  XL 25 mg daily - Will hold off on restarting his PTA Benicar  to allow adequate blood pressure for diuresis  Paroxysmal atrial fibrillation -started going in and out of afib with RVR this 9/23 -Currently on IV Amio drip -QTc prolonged this morning -Stop amiodarone  drip  -start Toprol  XL 25 mg daily -Transition IV heparin  drip to Eliquis  5 mg twice daily per pharmacy for [facility] score 2  Acute Diastolic CHF -cath 9/24 with markedly elevated LVEDP -CXray shows interstitial edema -suspect that his wheezing is multifactorial (CHF and viral infection) -Received 1 dose of IV Lasix  40 after the cath yesterday -UOP: 4.2 L yesterday and net +1.9 L since admission -Weight down 1 pound from admission -Start Lasix  40 mg IV twice daily today -Follow strict I's and O's, daily weights and renal function with diuresing -Restart PTA Jardiance  25 mg daily   I spent 35 minutes caring for this patient today face to face, ordering and reviewing labs, reviewing records from cardiac cath 9/24, seeing the patient, documenting in the record  For questions or updates, please contact [facility] Please consult [email or web address] for contact info under        Signed, Wilbert Bihari, MD  08/11/2024, 8:27 AM

## 2024-08-11 NOTE — TOC Initial Note (Signed)
 Transition of Care (TOC) - Initial/Assessment Note    Patient Details  Name: Jeffrey Black. MRN: [id number] Date of Birth: Jun 22, 1961  Transition of Care [facility]) CM/SW Contact:    Roxie KANDICE Stain, RN Phone Number: 08/11/2024, 7:56 PM  Clinical Narrative:                 Spoke to patient regarding transition needs.Notified patient that CIR won't accept him due to walking 442ft.  Patient is agreeable to Home health. Amy with [facility] accepted referral.  ICM (Inpatient Care Management) will continue to follow for needs.  Expected Discharge Plan: Home w Home Health Services Barriers to Discharge: Continued Medical Work up   Patient Goals and CMS Choice            Expected Discharge Plan and Services   Discharge Planning Services: CM Consult Post Acute Care Choice: Home Health Living arrangements for the past 2 months: Single Family Home                           HH Arranged: OT, PT HH Agency: [facility] Date [facility] Agency Contacted: 08/11/24 Time HH Agency Contacted: 1952 Representative spoke with at [facility] Agency: Amy  Prior Living Arrangements/Services Living arrangements for the past 2 months: Single Family Home Lives with:: Self Patient language and need for interpreter reviewed:: Yes Do you feel safe going back to the place where you live?: Yes      Need for Family Participation in Patient Care: Yes (Comment) Care giver support system in place?: Yes (comment)   Criminal Activity/Legal Involvement Pertinent to Current Situation/Hospitalization: No - Comment as needed  Activities of Daily Living   ADL Screening (condition at time of admission) Independently performs ADLs?: Yes (appropriate for developmental age) Is the patient deaf or have difficulty hearing?: No Does the patient have difficulty seeing, even when wearing glasses/contacts?: No Does the patient have difficulty concentrating, remembering, or making decisions?: No  Permission  Sought/Granted         Permission granted to share info w AGENCY: home health        Emotional Assessment   Attitude/Demeanor/Rapport: Engaged Affect (typically observed): Accepting Orientation: : Oriented to Self, Oriented to Place, Oriented to  Time, Oriented to Situation Alcohol  / Substance Use: Not Applicable Psych Involvement: No (comment)  Admission diagnosis:  Shock (HCC) [R57.9] Patient Active Problem List   Diagnosis Date Noted   Orthostatic hypotension 08/10/2024   Acute myopericarditis 08/10/2024   Myocarditis (HCC) 08/09/2024   Pneumomediastinum (HCC) 08/09/2024   Elevated troponin 08/09/2024   Chest pain of uncertain etiology 08/09/2024   Pericardial effusion 08/09/2024   Atrial fibrillation with RVR (HCC) 08/09/2024   Shock (HCC) 08/07/2024   NSTEMI (non-ST elevated myocardial infarction) (HCC) 08/07/2024   High risk medication use 11/26/2023   Alcohol  use disorder in remission 09/24/2023   Psoriatic arthritis (HCC) 05/19/2023   Hypertension 05/19/2023   Type 2 diabetes mellitus with hyperglycemia, without long-term current use of insulin  (HCC) 02/11/2023   Elevated blood pressure reading 02/11/2023   Psoriasis 07/28/2013   Elevated liver enzymes 02/22/2013   Depression 02/22/2013   PCP:  [name], MD Pharmacy:   [facility] [place], [place] - [address] [place] [address] Phone: [phone] Fax: [phone]  [facility] - [facility] [address] [place] [address] Phone: [phone] Fax: [phone]  Social Drivers of Health (SDOH) Social History: SDOH Screenings   Food Insecurity: No Food Insecurity (08/10/2024)  Housing: Low Risk  (08/10/2024)  Transportation Needs: No Transportation Needs (08/10/2024)  Utilities: Not At Risk (08/10/2024)  Depression (PHQ2-9): Low Risk  (03/31/2024)  Tobacco Use: High Risk (08/07/2024)   SDOH  Interventions: Transportation Interventions: Inpatient TOC   Readmission Risk Interventions    08/08/2024    3:35 PM  Readmission Risk Prevention Plan  Post Dischage Appt Complete  Medication Screening Complete  Transportation Screening Complete

## 2024-08-11 NOTE — Hospital Course (Addendum)
 Jeffrey Black is a 63 yo male with PMH psoriatic arthritis, DM II, history of alcohol  abuse who presented with fever, malaise, chest pain, syncope. He was found to have refractory hypotension on admission requiring admission to the ICU and vasopressor support. CTA negative for dissection, minimal GGO in LLL, mediastinal/hilar lymphadenopathy and small pneumomediastinum. Reports decreased activity in the last 2 years. No recent travel. Lives alone. Has had multiple falls and balancing issues    Significant Hospital Events:  9/21 admit 9/22 pressor requirement improving, LE DVT neg 9/23 off pressors, trop trending down, still on [facility] but feeling more short of breath this AM with sinus tach/ PAF, EF 55-60, mod pericardial effusion w/o tamponade, no RWMA  A&P:  Shock, undifferentiated  Acute Hypoxic Respiratory Failure Pneumomediastinum, resolved Mediastinal adenopathy Pericardial effusion Viral myocarditis  Lactic acidosis  - 9/21 CTA c/a/p for dissection w/wo> neg for aortic dissection or aneurysm, small pneumomediastinum, trace left pleural effusion, minimal LLL GGO, mediastinal and right hilar lymphadenopathy and periportal lymphadenopathy of uncertain etiology - LE DVT neg - Echo with preserved EF, but small to moderate pericardial effusion without tamponade  -cardiac MRI 9/23 concerning for acute myocarditis however some evidence of small apical inferior wall infarct, unclear if acute or old.  Unable to determine EF.   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 treated with DES -Triple therapy for 1 month planned with Eliquis , aspirin , Plavix  then  discontinuing aspirin    PAF - no longer on amio - S/p heparin  drip now transitioned to Eliquis  on 08/11/2024  Acute urinary retention -resolved - Patient endorses voiding adequately at home prior to admission but does endorse having a low stream at times -Catheter placed on admission likely in setting of acute illness but given clinical improvement, reasonable for a trial of void -Discontinue Foley on 08/11/2024 and start bladder scans - Able to void adequately with no further straight caths needed - Consider outpatient referral to urology given intermittent issue at home prior to hospitalization   HTN - Continue Lasix  and Toprol  -Continue losartan    HA 9/21 CT CTA head/ neck neg - remains non- focal   Hyponatremia, resolved  - normalized, trend on labs    Hypophosphorous - Repleted

## 2024-08-11 NOTE — TOC CM/SW Note (Signed)
 Transition of Care [facility]) - Inpatient Brief Assessment   Patient Details  Name: Jeffrey Black. MRN: [id number] Date of Birth: Jan 12, 1961  [facility] CM/SW Contact:    Lauraine FORBES Saa, LCSWA Phone Number: 08/11/2024, 9:05 AM   Clinical Narrative:  9:05 AM Per chart review, patient resides at home alone. Patient has a PCP and insurance. Patient does not have SNF/HH/DME history. Patient's preferred pharmacy's are [facility] and [facility]. Therapy recommended patient discharge to [facility]. [facility] is currently following patient. TOC will continue to follow patient and be available to assist.  Transition of Care Asessment: Insurance and Status: Insurance coverage has been reviewed Patient has primary care physician: Yes Home environment has been reviewed: Private Residence Prior level of function:: Independent Prior/Current Home Services: No current home services Social Drivers of Health Review: SDOH reviewed no interventions necessary Readmission risk has been reviewed: Yes (Currently Yellow 16%) Transition of care needs: transition of care needs identified, TOC will continue to follow

## 2024-08-11 NOTE — Progress Notes (Signed)
   Inpatient Rehabilitation Admissions Coordinator   Noted PT recommendations have changed to Home health . He is supervision to contact guard assist 400 feet with rollator. OT does note limited insights to deficits and need cues for self management of dyspnea on exertion. On 2 liters [place]. I will alert RN CM and SW of recommendations for home with [facility] with 24/7 supervision of family when medically ready to discharge. We will sign off.  Heron Leavell, RN, MSN Rehab Admissions Coordinator [phone] 08/11/2024 1:57 PM

## 2024-08-11 NOTE — Telephone Encounter (Signed)
Patient Product/process development scientist completed.    The patient is insured through [facility] MEDICAID.     Ran test claim for Eliquis 5 mg and the current 30 day co-pay is $4.00.   This test claim was processed through [facility]- copay amounts may vary at other pharmacies due to pharmacy/plan contracts, or as the patient moves through the different stages of their insurance plan.     Roland Earl, CPHT Pharmacy Technician III Certified Patient Advocate [facility] Patient Advocate Team Direct Number: [phone]  Fax: [phone]

## 2024-08-12 DIAGNOSIS — R579 Shock, unspecified: Secondary | ICD-10-CM | POA: Diagnosis not present

## 2024-08-12 DIAGNOSIS — R079 Chest pain, unspecified: Secondary | ICD-10-CM | POA: Diagnosis not present

## 2024-08-12 DIAGNOSIS — I10 Essential (primary) hypertension: Secondary | ICD-10-CM | POA: Diagnosis not present

## 2024-08-12 DIAGNOSIS — R7989 Other specified abnormal findings of blood chemistry: Secondary | ICD-10-CM | POA: Diagnosis not present

## 2024-08-12 LAB — CBC
HCT: 31.5 % — ABNORMAL LOW (ref 39.0–52.0)
Hemoglobin: 10.2 g/dL — ABNORMAL LOW (ref 13.0–17.0)
MCH: 28.1 pg (ref 26.0–34.0)
MCHC: 32.4 g/dL (ref 30.0–36.0)
MCV: 86.8 fL (ref 80.0–100.0)
Platelets: 320 K/uL (ref 150–400)
RBC: 3.63 MIL/uL — ABNORMAL LOW (ref 4.22–5.81)
RDW: 14.7 % (ref 11.5–15.5)
WBC: 13.6 K/uL — ABNORMAL HIGH (ref 4.0–10.5)
nRBC: 0 % (ref 0.0–0.2)

## 2024-08-12 LAB — BASIC METABOLIC PANEL WITH GFR
Anion gap: 11 (ref 5–15)
BUN: 25 mg/dL — ABNORMAL HIGH (ref 8–23)
CO2: 23 mmol/L (ref 22–32)
Calcium: 7.8 mg/dL — ABNORMAL LOW (ref 8.9–10.3)
Chloride: 103 mmol/L (ref 98–111)
Creatinine, Ser: 0.78 mg/dL (ref 0.61–1.24)
GFR, Estimated: >60 mL/min (ref >60)
Glucose, Bld: 93 mg/dL (ref 70–99)
Potassium: 3.8 mmol/L (ref 3.5–5.1)
Sodium: 137 mmol/L (ref 135–145)

## 2024-08-12 LAB — CULTURE, BLOOD (ROUTINE X 2)
Culture: NO GROWTH
Culture: NO GROWTH

## 2024-08-12 LAB — GLUCOSE, CAPILLARY
Glucose-Capillary: 97 mg/dL (ref 70–99)
Glucose-Capillary: 151 mg/dL — ABNORMAL HIGH (ref 70–99)
Glucose-Capillary: 155 mg/dL — ABNORMAL HIGH (ref 70–99)
Glucose-Capillary: 184 mg/dL — ABNORMAL HIGH (ref 70–99)
Glucose-Capillary: 163 mg/dL — ABNORMAL HIGH (ref 70–99)

## 2024-08-12 LAB — PHOSPHORUS: Phosphorus: 2.3 mg/dL — ABNORMAL LOW (ref 2.5–4.6)

## 2024-08-12 LAB — MAGNESIUM: Magnesium: 2.6 mg/dL — ABNORMAL HIGH (ref 1.7–2.4)

## 2024-08-12 NOTE — Progress Notes (Signed)
 Progress Note    Jeffrey Black.   FMW:[id number]  DOB: 1961/08/12  DOA: 08/07/2024     5 PCP: [name], MD  Initial CC: fever, syncope   Hospital Course: Jeffrey Black is a 63 yo male with PMH psoriatic arthritis, DM II, history of alcohol  abuse who presented with fever, malaise, chest pain, syncope. He was found to have refractory hypotension on admission requiring admission to the ICU and vasopressor support. CTA negative for dissection, minimal GGO in LLL, mediastinal/hilar lymphadenopathy and small pneumomediastinum. Reports decreased activity in the last 2 years. No recent travel. Lives alone. Has had multiple falls and balancing issues    Significant Hospital Events:  9/21 admit 9/22 pressor requirement improving, LE DVT neg 9/23 off pressors, trop trending down, still on [place] but feeling more short of breath this AM with sinus tach/ PAF, EF 55-60, mod pericardial effusion w/o tamponade, no RWMA  A&P:  Shock, undifferentiated  Acute Hypoxic Respiratory Failure Pneumomediastinum, resolved Mediastinal adenopathy Pericardial effusion Viral myocarditis  Lactic acidosis  - 9/21 CTA c/a/p for dissection w/wo> neg for aortic dissection or aneurysm, small pneumomediastinum, trace left pleural effusion, minimal LLL GGO, mediastinal and right hilar lymphadenopathy and periportal lymphadenopathy of uncertain etiology - LE DVT neg - Echo with preserved EF, but small to moderate pericardial effusion without tamponade  -cardiac MRI 9/23 concerning for acute myocarditis however some evidence of small apical inferior wall infarct, unclear if acute or old.  Unable to determine EF.   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 DES -Triple therapy for 1 month planned with Eliquis , aspirin , Plavix  then discontinuing aspirin    PAF - no longer on amio - S/p heparin  drip now transitioned to Eliquis  on 08/11/2024  Acute urinary retention - Patient endorses voiding adequately at home prior to admission but does endorse having a low stream at times -Catheter placed on admission likely in setting of acute illness but given clinical improvement, reasonable for a trial of void -Discontinue Foley on 08/11/2024 and start bladder scans - Required straight cath overnight - Continue scans; if fails again, may need foley replaced and outpt urology follow up   HTN - Continue Lasix  and Toprol  -Continue losartan    HA 9/21 CT CTA head/ neck neg - remains non- focal   Hyponatremia, resolved  - normalized, trend on labs    Hypophosphorous - Replete as needed  Interval History:  No events overnight.  Sitting up in recliner with legs elevated this morning.  Still has concern over swelling in his legs but tried to reassure him.  Denies any chest pain or shortness of breath.   Old records reviewed in assessment of this patient  Antimicrobials: Zosyn  08/07/2024 >> 08/11/2024 Augmentin  08/11/2024 >> current  DVT prophylaxis:  SCDs Start: 08/07/24 1627 apixaban  (ELIQUIS ) tablet 5 mg   Code Status:   Code Status: Full Code  Mobility Assessment (Last 72 Hours)     Mobility Assessment     Row Name 08/12/24 1400 08/12/24 0800 08/11/24 2015 08/11/24 2000 08/11/24 1306   Does the patient have exclusion criteria? -- No - Perform mobility assessment No - Perform mobility assessment  No - Perform mobility assessment --   What is the highest level of mobility based on the mobility assessment? Level 4 (Ambulates with assistance) - Balance while stepping forward/back - Complete Level 4 (Ambulates with assistance) - Balance while stepping forward/back - Complete Level 4 (Ambulates with  assistance) - Balance while stepping forward/back - Complete Level 4 (Ambulates with assistance) - Balance while stepping forward/back - Complete Level 2 (Chairfast) - Balance while sitting on edge of bed and cannot stand   Is the above level different from baseline mobility prior to current illness? -- Yes - Recommend PT order Yes - Recommend PT order Yes - Recommend PT order --    Row Name 08/11/24 1304 08/11/24 0900 08/10/24 2000 08/10/24 1820 08/10/24 1614   Does the patient have exclusion criteria? -- No - Perform mobility assessment No - Perform mobility assessment No - Perform mobility assessment --   What is the highest level of mobility based on the mobility assessment? Level 4 (Ambulates with assistance) - Balance while stepping forward/back - Complete Level 2 (Chairfast) - Balance while sitting on edge of bed and cannot stand Level 2 (Chairfast) - Balance while sitting on edge of bed and cannot stand Level 1 (Bedfast) - Unable to balance while sitting on edge of bed Level 4 (Ambulates with assistance) - Balance while stepping forward/back - Complete   Is the above level different from baseline mobility prior to current illness? -- Yes - Recommend PT order Yes - Recommend PT order Yes - Recommend PT order --    Row Name 08/10/24 0800           Does the patient have exclusion criteria? No - Perform mobility assessment       What is the highest level of mobility based on the mobility assessment? Level 4 (Ambulates with assistance) - Balance while stepping forward/back - Complete          Barriers to discharge: none Disposition Plan:  ?CIR HH orders placed: n/a Status is: Inpt  Objective: Blood pressure 125/83, pulse 70, temperature 97.6 F (36.4 C), temperature source Oral, resp. rate 20, height 5' 11.5 (1.816 m), weight 95.2 kg, SpO2 93%.  Examination:  Physical Exam Constitutional:      General: He is not in acute distress.    Appearance: Normal appearance.  HENT:     Head:  Normocephalic and atraumatic.     Mouth/Throat:     Mouth: Mucous membranes are moist.  Eyes:     Extraocular Movements: Extraocular movements intact.  Cardiovascular:     Rate and Rhythm: Normal rate and regular rhythm.  Pulmonary:     Effort: Pulmonary effort is normal. No respiratory distress.     Breath sounds: Normal breath sounds. No wheezing.  Abdominal:     General: Bowel sounds are normal. There is no distension.     Palpations: Abdomen is soft.     Tenderness: There is no abdominal tenderness.  Musculoskeletal:        General: Normal range of motion.     Cervical back: Normal range of motion and neck supple.     Right lower leg: Edema (1+) present.     Left lower leg: Edema (1+) present.  Skin:    General: Skin is warm and dry.  Neurological:     General: No focal deficit present.     Mental Status: He is alert.  Psychiatric:        Mood and Affect: Mood normal.  Behavior: Behavior normal.      Consultants:  Cardiology Pulmonology  Procedures:  08/10/2024: Left heart cath  Data Reviewed: Results for orders placed or performed during the hospital encounter of 08/07/24 (from the past 24 hours)  Glucose, capillary     Status: Abnormal   Collection Time: 08/11/24  3:49 PM  Result Value Ref Range   Glucose-Capillary 172 (H) 70 - 99 mg/dL  Glucose, capillary     Status: Abnormal   Collection Time: 08/11/24  7:56 PM  Result Value Ref Range   Glucose-Capillary 236 (H) 70 - 99 mg/dL   Comment 1 Notify RN   Glucose, capillary     Status: Abnormal   Collection Time: 08/11/24 11:35 PM  Result Value Ref Range   Glucose-Capillary 122 (H) 70 - 99 mg/dL   Comment 1 Notify RN   CBC     Status: Abnormal   Collection Time: 08/12/24  2:53 AM  Result Value Ref Range   WBC 13.6 (H) 4.0 - 10.5 K/uL   RBC 3.63 (L) 4.22 - 5.81 MIL/uL   Hemoglobin 10.2 (L) 13.0 - 17.0 g/dL   HCT 68.4 (L) 60.9 - 47.9 %   MCV 86.8 80.0 - 100.0 fL   MCH 28.1 26.0 - 34.0 pg   MCHC 32.4  30.0 - 36.0 g/dL   RDW 85.2 88.4 - 84.4 %   Platelets 320 150 - 400 K/uL   nRBC 0.0 0.0 - 0.2 %  Magnesium      Status: Abnormal   Collection Time: 08/12/24  2:53 AM  Result Value Ref Range   Magnesium  2.6 (H) 1.7 - 2.4 mg/dL  Phosphorus     Status: Abnormal   Collection Time: 08/12/24  2:53 AM  Result Value Ref Range   Phosphorus 2.3 (L) 2.5 - 4.6 mg/dL  Basic metabolic panel with GFR     Status: Abnormal   Collection Time: 08/12/24  2:53 AM  Result Value Ref Range   Sodium 137 135 - 145 mmol/L   Potassium 3.8 3.5 - 5.1 mmol/L   Chloride 103 98 - 111 mmol/L   CO2 23 22 - 32 mmol/L   Glucose, Bld 93 70 - 99 mg/dL   BUN 25 (H) 8 - 23 mg/dL   Creatinine, Ser 9.21 0.61 - 1.24 mg/dL   Calcium  7.8 (L) 8.9 - 10.3 mg/dL   GFR, Estimated >39 >39 mL/min   Anion gap 11 5 - 15  Glucose, capillary     Status: None   Collection Time: 08/12/24  4:30 AM  Result Value Ref Range   Glucose-Capillary 97 70 - 99 mg/dL   Comment 1 Notify RN   Glucose, capillary     Status: Abnormal   Collection Time: 08/12/24  8:23 AM  Result Value Ref Range   Glucose-Capillary 151 (H) 70 - 99 mg/dL  Glucose, capillary     Status: Abnormal   Collection Time: 08/12/24 11:49 AM  Result Value Ref Range   Glucose-Capillary 155 (H) 70 - 99 mg/dL  Glucose, capillary     Status: Abnormal   Collection Time: 08/12/24  3:43 PM  Result Value Ref Range   Glucose-Capillary 184 (H) 70 - 99 mg/dL    I have reviewed pertinent nursing notes, vitals, labs, and images as necessary. I have ordered labwork to follow up on as indicated.  I have reviewed the last notes from staff over past 24 hours. I have discussed patient's care plan and test results with nursing staff, CM/SW, and  other staff as appropriate.  Time spent: Greater than 50% of the 55 minute visit was spent in counseling/coordination of care for the patient as laid out in the A&P.   LOS: 5 days   Alm Apo, MD [facility] 08/12/2024, 3:44 PM

## 2024-08-12 NOTE — Plan of Care (Signed)
  Problem: Metabolic: Goal: Ability to maintain appropriate glucose levels will improve Outcome: Progressing   Problem: Education: Goal: Knowledge of General Education information will improve Description: Including pain rating scale, medication(s)/side effects and non-pharmacologic comfort measures Outcome: Progressing   Problem: Clinical Measurements: Goal: Respiratory complications will improve Outcome: Progressing   Problem: Activity: Goal: Risk for activity intolerance will decrease Outcome: Progressing   Problem: Elimination: Goal: Will not experience complications related to urinary retention Outcome: Progressing

## 2024-08-12 NOTE — Plan of Care (Signed)
   Problem: Education: Goal: Ability to describe self-care measures that may prevent or decrease complications (Diabetes Survival Skills Education) will improve Outcome: Progressing

## 2024-08-12 NOTE — Progress Notes (Addendum)
 Will be the 2 mg   Progress Note  Patient Name: Jeffrey Black. Date of Encounter: 08/12/2024  [facility] Cardiologist: Wilbert Bihari, MD   Patient Profile     Subjective    2D echo showed EF 55 to 60% with no focal wall motion abnormalities and G1 DD as well as small to moderate pericardial effusion  cMRI showed possible myopericarditis as well as possible inferior infarct  Cath showed 65% distal RCA with a focal ulcerated plaque and 10% stenosed RPA V sidebranch status post PCI of the distal RCA with residual disease of 20% in the mid LAD and sidebranch of D2 with severe the elevated LVEDP and given dose of IV Lasix .  RCA lesion not felt to be etiology of patient's elevated trop and more c/w myocarditis  UOP 3.5 L yesterday and net -1.2 L since admission  Maintaining NSR on tele  No CP and SOB much improved   Inpatient Medications    Scheduled Meds:  amoxicillin -clavulanate  1 tablet Oral Q12H   apixaban   5 mg Oral BID   aspirin   81 mg Oral Daily   budesonide  (PULMICORT ) nebulizer solution  0.5 mg Nebulization BID   Chlorhexidine  Gluconate Cloth  6 each Topical Daily   clopidogrel   75 mg Oral Q breakfast   colchicine   0.6 mg Oral BID   empagliflozin   25 mg Oral Daily   free water   250 mL Oral Once   furosemide   40 mg Intravenous BID   guaiFENesin   600 mg Oral BID   insulin  aspart  0-9 Units Subcutaneous Q4H   losartan   25 mg Oral Daily   methylPREDNISolone  (SOLU-MEDROL ) injection  40 mg Intravenous Daily   metoprolol  succinate  25 mg Oral Daily   nicotine   14 mg Transdermal Daily   pantoprazole  (PROTONIX ) IV  40 mg Intravenous QHS   revefenacin   175 mcg Nebulization Daily   rosuvastatin   20 mg Oral Daily   sodium chloride  flush  3 mL Intravenous Q12H   Continuous Infusions:   PRN Meds: acetaminophen , docusate sodium , HYDROmorphone  (DILAUDID ) injection, levalbuterol , lip balm, ondansetron  (ZOFRAN ) IV, mouth rinse, polyethylene glycol, sodium chloride   flush   Vital Signs    Vitals:   08/11/24 1958 08/11/24 2334 08/12/24 0430 08/12/24 0600  BP: (!) 144/93 (!) 134/91 118/73   Pulse: 83 74 68   Resp: 19 18 19    Temp: 98.8 F (37.1 C) 98.2 F (36.8 C) 98.3 F (36.8 C)   TempSrc: Oral Oral Oral   SpO2: 97% 98% 98%   Weight:    95.2 kg  Height:        Intake/Output Summary (Last 24 hours) at 08/12/2024 0645 Last data filed at 08/12/2024 0431 Gross per 24 hour  Intake 240 ml  Output 3450 ml  Net -3210 ml      08/12/2024    6:00 AM 08/11/2024    6:00 AM 08/10/2024    4:22 AM  Last 3 Weights  Weight (lbs) 209 lb 14.1 oz 213 lb 6.5 oz 214 lb 15.2 oz  Weight (kg) 95.2 kg 96.8 kg 97.5 kg      Telemetry    NSR Personally Reviewed  ECG    No new EKG to review- Personally Reviewed  Physical Exam   GEN: Well nourished, well developed in no acute distress HEENT: Normal NECK: No JVD; No carotid bruits LYMPHATICS: No lymphadenopathy CARDIAC:RRR, no murmurs, rubs, gallops RESPIRATORY:  Clear to auscultation without rales, wheezing or rhonchi  ABDOMEN:  Soft, non-tender, non-distended MUSCULOSKELETAL:  trace edema; No deformity  SKIN: Warm and dry NEUROLOGIC:  Alert and oriented x 3 PSYCHIATRIC:  Normal affect  Labs    High Sensitivity Troponin:   Recent Labs  Lab 08/07/24 1529 08/07/24 1740 08/08/24 1022 08/08/24 1730 08/09/24 1443  TROPONINIHS 1,350* 1,336* 2,114* 1,143* 1,076*      Chemistry Recent Labs  Lab 08/07/24 1529 08/07/24 1533 08/11/24 0231 08/11/24 0829 08/12/24 0253  NA 130*   < > 136 137 137  K 4.3   < > 4.1 4.0 3.8  CL 99   < > 103 104 103  CO2 19*   < > 22 20* 23  GLUCOSE 163*   < > 159* 148* 93  BUN 14   < > 21 21 25*  CREATININE 1.21   < > 1.09 1.00 0.78  CALCIUM  8.7*   < > 7.8* 8.2* 7.8*  PROT 5.7*  --   --   --   --   ALBUMIN  2.9*  --   --   --   --   AST 27  --   --   --   --   ALT 40  --   --   --   --   ALKPHOS 40  --   --   --   --   BILITOT 0.5  --   --   --   --   GFRNONAA  >60   < > >60 >60 >60  ANIONGAP 12   < > 11 13 11    < > = values in this interval not displayed.     Hematology Recent Labs  Lab 08/11/24 0231 08/11/24 0829 08/12/24 0253  WBC 13.4* 19.1* 13.6*  RBC 3.65* 4.03* 3.63*  HGB 10.4* 11.5* 10.2*  HCT 31.7* 34.6* 31.5*  MCV 86.8 85.9 86.8  MCH 28.5 28.5 28.1  MCHC 32.8 33.2 32.4  RDW 14.8 14.7 14.7  PLT 281 368 320    BNP Recent Labs  Lab 08/07/24 1529 08/09/24 1443  BNP 190.5* 436.5*     DDimer No results for input(s): DDIMER in the last 168 hours.   Radiology    DG Chest Port 1 View Result Date: 08/11/2024 EXAM: 1 VIEW(S) XRAY OF THE CHEST 08/11/2024 07:34:00 AM COMPARISON: 08/09/2024 CLINICAL HISTORY: Hypoxia FINDINGS: LUNGS AND PLEURA: Small left pleural effusion. Increased diffuse interstitial markings with increased left retrocardiac opacity. No pulmonary edema. No pneumothorax. HEART AND MEDIASTINUM: Cardiomegaly. BONES AND SOFT TISSUES: No acute osseous abnormality. IMPRESSION: 1. Increased left retrocardiac opacity, which may reflect atelectasis or developing consolidation. 2. Diffuse increased interstitial markings concerning for pulmonary edema. 3. Small left pleural effusion. 4. Cardiomegaly. Electronically signed by: Waddell Calk MD 08/11/2024 08:16 AM EDT RP Workstation: [id number]   CARDIAC CATHETERIZATION Result Date: 08/10/2024 Table formatting from the original result was not included. Images from the original result were not included.   Likely culprit lesion: Dist RCA lesion is 65% stenosed (focal ulcerated plaque) with 10% stenosed RPA V side branch in RPAV.   A drug-eluting stent was successfully placed from a branch into the RPA V sidebranch (across the RPDA) using a STENT SYNERGY XD 3.50X20 in the main branch and side branch.  Stent was deployed at 3.8 mm and postdilated to 4.1 mm in the main branch.   Post intervention, there is a 0% residual stenosis. Post intervention, the side branch was reduced to 0%  residual stenosis.  TIMI-3 flow restored.   --------------------------------------------------------------------------- -----  Mid LAD lesion is 20% stenosed with 20% stenosed side branch in 2nd Diag.   --------------------------------------------------------------------------- -----   LV end diastolic pressure is severely elevated.   There is no aortic valve stenosis. Diagnostic: Dominance: Right                                                          Intervention  Likely culprit lesion for MI (with inferior MI seen on MRI) is a ulcerated plaque (65%) in the mid RCA just proximal to a high bifurcation into PDA and PAV.   Otherwise the rest of the RCA with several PL branches and PDA are free of disease. Successful DES PCI of 65% ulcerated plaque mid RCA stenting from RCA into RPA V with 3.5 mm x 20 mm Synergy XD deployed 3.8 mm, and postdilated proximally to 4.1 mm upstream of the bifurcation.  The stent was fully expanded however there still remains a small ledge of the plaque outside of the stent but notable improvement of flow. = Lesion essentially reduced to 0%> LCA: Other than 20% mid LAD at D1 takeoff, angiographically minimal disease.  1 small 1 moderate caliber diagonal branch with extensive bifurcation.  LCx terminates as an OM1-OM2 with an AV groove branch and small posterolateral branch. Severely  elevated LVEDP of roughly 30 mmHg consistent with Acute Diastolic Heart Failure Treated with 40 mg IV Lasix  in the Cath Lab.  RECOMMENDATIONS   The patient was loaded with ticagrelor /Brilinta  in the Cath Lab, but it became known that he would likely require DOAC and therefore the plan is to reload with Plavix /clopidogrel  the morning of 08/11/2024 and then continue Plavix  75 mg daily from then on   Recommend to resume undecided DOAC, at currently prescribed dose and frequency on 08/11/2024.   Recommend concurrent antiplatelet therapy of Aspirin  81 mg for 1 month and Clopidogrel  75mg  daily for 6 months  If there  is increased bleeding risk concern, could consider shortening aspirin  to 2 weeks .   Due to significant elevated LVEDP, he was given 40 mg IV Lasix  in the Cath Lab, would reassess volume status in the morning and strongly consider additional diuresis. PLAN OF CARE: Continue ongoing care for URI and likely myocarditis.  I suspect that the MRI and RCA lesion were incidental findings.  I have written to convert from Brilinta  to clopidogrel  (loading dose 300 mg morning 08/11/2024 and then 25 mg daily after that, provided that the plan is to place the patient on a DOAC.  With elevated EDP of roughly 30 mmHg, would likely consider additional diuresis (he was given 40 mg IV Lasix  in the Cath Lab and would assess in the morning.  Once stable from a cardiovascular standpoint, can begin to titrate GDMT.  Alm Clay   Patient Profile     63 y.o. male with a hx of psoriasis, hepatic steatosis, tobacco use, and EtOH use (quit 2016) who is being seen 08/07/2024 for the evaluation of chest pain and elevated troponin.   Assessment & Plan    Chest pain Elevated troponin/NSTEMI ASCAD Hypotension Acute Myocarditis Pericardial Effusion -Patient presented with hypotension and shock felt likely related to sepsis -Chest pain pleuritic and CT showed pneumomediastinum -Elevated high-sensitivity troponins (1350>> 1336>> 2114>> 1143) -No ST changes on EKG -2D echo: Normal LV function EF 55  to 60%, moderate LVH, G1 DD, mild RV dysfunction and small to moderate pericardial effusion -cMRI showed SE LGE in apical inferior wall c/w small infarct but also evidence of myocardial edema and moderate pericardial effusion>>meets criteria for acute myocarditis but also ? Inferior infarct age undetermined -LHC 08/10/2024: 65% distal LAD with 10% ostial R PLA status post PCI of the distal RCA with residual stenosis of 20% mid LAD and ostial D2>>felt that RCA lesion/infarct on MRI were incidental findings and myocardits the cause of  current hs Trop elevation -Plan triple therapy for 1 month and then stop aspirin  81 mg daily.  Ok to stop Plavix  if needed after 6 months and continue with DOAC for PAF -continue ASA 81mg  daily, Plavix  75mg  daily, Toprol  Xl 25mg  daily, Crestor  20mg  daily -Will need FLP and ALT in 6 weeks -started on Colchicine  0.6mg  BID for myocarditis -repeat 2D echo in 1 week  Hypertension - Presented with hypotension in the setting of shock - BP improved with IV fluids and control of afib with RVR - continue Losartan  25mg  daily and Toprol  XL 25mg  daily  Paroxysmal atrial fibrillation -started going in and out of afib with RVR 9/23 -QTc prolonged on Amio>>d/c'd -remains in NSR with no further PAF on tele -continue Toprol  25mg  daily and Eliquis  5mg  BID for CHADS2VASC score of 2  Acute Diastolic CHF -cath 9/24 with markedly elevated LVEDP -CXray showed interstitial edema -currently on lasix  40mg  IV BID -UOP: 1.1 L yesterday and net -1.8 L since admission -Weight down 5lbs from admission -SCr 0.78; K+ 3.8; Mag 2.6  -continue lasix  40mg  IV BID -continue Jardiance  25mg  daily  I spent 35 minutes caring for this patient today face to face, ordering and reviewing labs, reviewing records from cardiac cath 9/24, Right heart cath; seeing the patient, documenting in the record  For questions or updates, please contact [facility] Please consult [email or web address] for contact info under        Signed, Wilbert Bihari, MD  08/12/2024, 6:45 AM

## 2024-08-12 NOTE — Progress Notes (Signed)
 Physical Therapy Treatment Patient Details Name: Jeffrey Black. MRN: [id number] DOB: May 28, 1961 Today's Date: 08/12/2024   History of Present Illness Pt is 63 year old presented to [facility] on  [facility]/07/24 for chest pain and syncopal episodes. Pt hypotensive and started on pressors. Pt with acute hypoxemic respiratory failure secondary to URI and septic shock. Chest pain was pleuritic and CT showed pneumomediastinum. PMH - DM, psoriasis, etoh use in remission    PT Comments  Pt received in supine, on 2L O2 [place], pt agreeable to therapy session and with good participation and tolerance for transfer and gait training. Pt unsteady without AD for household distance gait trial, needing CGA and occasional use of wall rail/furniture due to scissoring steps with fatigue or when pt less focused on foot placement, indicating high risk of falls. SpO2 93% and above with good pleth signal for exertional tasks this date. Pt agreeable to sit up in recliner post-exertion, PTA educating him on building activity tolerance sitting up and for improved pulmonary clearance. Pt continues to benefit from PT services to progress toward functional mobility goals as long as pt has PRN assist/supervision at home for safety.     If plan is discharge home, recommend the following: A little help with walking and/or transfers;Help with stairs or ramp for entrance;Assist for transportation;Assistance with cooking/housework   Can travel by private vehicle        Equipment Recommendations  None recommended by PT (rollator or RW if he does not have one)    Recommendations for Other Services       Precautions / Restrictions Precautions Precautions: Fall Recall of Precautions/Restrictions: Impaired Precaution/Restrictions Comments: watch HR and O2; scissoring steps without AD Restrictions Weight Bearing Restrictions Per Provider Order: No     Mobility  Bed Mobility Overal bed mobility: Needs Assistance Bed Mobility: Supine to  Sit     Supine to sit: Modified independent (Device/Increase time)     General bed mobility comments: Increased time to initiate; to L EOB    Transfers Overall transfer level: Needs assistance Equipment used: None Transfers: Sit to/from Stand Sit to Stand: Supervision, Contact guard assist           General transfer comment: CGA without AD, supervision for stand>sit cues for UE placement/safety. no chair alarm in his room, RN notified.    Ambulation/Gait Ambulation/Gait assistance: Contact guard assist Gait Distance (Feet): 130 Feet Assistive device: None Gait Pattern/deviations: Step-through pattern, Scissoring, Drifts right/left Gait velocity: decreased     General Gait Details: Pt reports feet feel swollen; without AD, multiple episodes of scissoring in hallway with intermittent R rail use and CGA for safety; PTA recommending rollator vs RW when pt up without therapies, RN notified and PTA updated his board with assist levels; SpO2 93-97% on RA with exertion; HR 60's bpm.   Stairs             Wheelchair Mobility     Tilt Bed    Modified Rankin (Stroke Patients Only)       Balance Overall balance assessment: Needs assistance Sitting-balance support: Feet supported Sitting balance-Leahy Scale: Good     Standing balance support: During functional activity, Single extremity supported Standing balance-Leahy Scale: Poor Standing balance comment: pt encouraged to use RW/rollator when up, unsteady without AD                            Communication Communication Communication: No apparent difficulties  Cognition Arousal:  Alert Behavior During Therapy: WFL for tasks assessed/performed, Anxious   PT - Cognitive impairments: No family/caregiver present to determine baseline, Attention, Safety/Judgement, Problem solving                       PT - Cognition Comments: Decreased safety awareness and sometimes pt reponses to questions seem  tangential or unrelated. No family present but pt appears possibly slightly different presentation from previous date, RN notified. Following commands: Intact      Cueing Cueing Techniques: Verbal cues, Gestural cues  Exercises      General Comments General comments (skin integrity, edema, etc.): RN notified pt needs chair alarm set for safety due to high fall risk (none in room); weaned to RA during session      Pertinent Vitals/Pain Pain Assessment Pain Assessment: No/denies pain    Home Living                          Prior Function            PT Goals (current goals can now be found in the care plan section) Acute Rehab PT Goals Patient Stated Goal: return home PT Goal Formulation: With patient Time For Goal Achievement: 08/22/24 Progress towards PT goals: Progressing toward goals    Frequency    Min 3X/week      PT Plan      Co-evaluation              AM-PAC PT 6 Clicks Mobility   Outcome Measure  Help needed turning from your back to your side while in a flat bed without using bedrails?: None Help needed moving from lying on your back to sitting on the side of a flat bed without using bedrails?: None Help needed moving to and from a bed to a chair (including a wheelchair)?: A Little Help needed standing up from a chair using your arms (e.g., wheelchair or bedside chair)?: A Little Help needed to walk in hospital room?: A Little Help needed climbing 3-5 steps with a railing? : A Little 6 Click Score: 20    End of Session Equipment Utilized During Treatment: Gait belt Activity Tolerance: Patient tolerated treatment well Patient left: in chair;with call bell/phone within reach;Other (comment) (LE elevated on pillow in chair; pt agreeable to use call bell before getting up, RN notified he needs chair alarm brought to room) Nurse Communication: Mobility status;Other (comment) (cog may not be baseline? unclear. pt needs chair alarm for  safety, none in room) PT Visit Diagnosis: Unsteadiness on feet (R26.81);Muscle weakness (generalized) (M62.81);Other abnormalities of gait and mobility (R26.89)     Time: 8743-8692 PT Time Calculation (min) (ACUTE ONLY): 11 min  Charges:    $Gait Training: 8-22 mins PT General Charges $$ ACUTE PT VISIT: 1 Visit                     Kapena Hamme P., PTA Acute Rehabilitation Services Secure Chat Preferred 9a-5:30pm Office: [phone]    Connell HERO [facility] 08/12/2024, 2:29 PM

## 2024-08-13 DIAGNOSIS — R579 Shock, unspecified: Secondary | ICD-10-CM | POA: Diagnosis not present

## 2024-08-13 DIAGNOSIS — I214 Non-ST elevation (NSTEMI) myocardial infarction: Secondary | ICD-10-CM | POA: Diagnosis not present

## 2024-08-13 LAB — GLUCOSE, CAPILLARY
Glucose-Capillary: 102 mg/dL — ABNORMAL HIGH (ref 70–99)
Glucose-Capillary: 112 mg/dL — ABNORMAL HIGH (ref 70–99)
Glucose-Capillary: 149 mg/dL — ABNORMAL HIGH (ref 70–99)
Glucose-Capillary: 150 mg/dL — ABNORMAL HIGH (ref 70–99)
Glucose-Capillary: 203 mg/dL — ABNORMAL HIGH (ref 70–99)
Glucose-Capillary: 78 mg/dL (ref 70–99)
Glucose-Capillary: 86 mg/dL (ref 70–99)

## 2024-08-13 LAB — LIPOPROTEIN A (LPA): Lipoprotein (a): 19.2 nmol/L (ref <75.0)

## 2024-08-13 MED ORDER — INFLUENZA VIRUS VACC SPLIT PF (FLUZONE) 0.5 ML IM SUSY
0.5000 mL | PREFILLED_SYRINGE | INTRAMUSCULAR | Status: AC
  Administered 2024-08-14: 0.5 mL via INTRAMUSCULAR
  Filled 2024-08-13: qty 0.5

## 2024-08-13 MED ORDER — FUROSEMIDE 40 MG PO TABS
40.0000 mg | ORAL_TABLET | Freq: Two times a day (BID) | ORAL | Status: DC
  Administered 2024-08-13 – 2024-08-14 (×2): 40 mg via ORAL
  Filled 2024-08-13 (×2): qty 1
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
## 2024-08-13 NOTE — Plan of Care (Signed)

## 2024-08-13 NOTE — Progress Notes (Signed)
 Mobility Specialist: Progress Note   08/13/24 1400  Mobility  Activity Ambulated with assistance  Level of Assistance Contact guard assist, steadying assist  Assistive Device None  Distance Ambulated (ft) 275 ft  Activity Response Tolerated well  Mobility Referral Yes  Mobility visit 1 Mobility  Mobility Specialist Start Time (ACUTE ONLY) 1010  Mobility Specialist Stop Time (ACUTE ONLY) 1028  Mobility Specialist Time Calculation (min) (ACUTE ONLY) 18 min    Pre-Mobility: SpO2 97% RA During Mobility: SpO2 95% RA  Pt received in bed, agreeable to mobility session. CGA mostly, 2x LOB requiring minG to correct. No complaints. Returned to room without fault. Left on EOB with all needs met, call bell in reach.   Ileana Lute Mobility Specialist Please contact via SecureChat or Rehab office at [phone]

## 2024-08-13 NOTE — Progress Notes (Signed)
 Progress Note    [name]   FMW:[id number]  DOB: [birth year]  DOA: 08/07/2024     6 PCP: [name], MD  Initial CC: fever, syncope   Hospital Course: Mr. Jeffrey Black is a 63 yo male with PMH psoriatic arthritis, DM II, history of alcohol  abuse who presented with fever, malaise, chest pain, syncope. He was found to have refractory hypotension on admission requiring admission to the ICU and vasopressor support. CTA negative for dissection, minimal GGO in LLL, mediastinal/hilar lymphadenopathy and small pneumomediastinum. Reports decreased activity in the last 2 years. No recent travel. Lives alone. Has had multiple falls and balancing issues    Significant Hospital Events:  9/21 admit 9/22 pressor requirement improving, LE DVT neg 9/23 off pressors, trop trending down, still on [place] but feeling more short of breath this AM with sinus tach/ PAF, EF 55-60, mod pericardial effusion w/o tamponade, no RWMA  A&P:  Shock, undifferentiated  Acute Hypoxic Respiratory Failure Pneumomediastinum, resolved Mediastinal adenopathy Pericardial effusion Viral myocarditis  Lactic acidosis  - 9/21 CTA c/a/p for dissection w/wo> neg for aortic dissection or aneurysm, small pneumomediastinum, trace left pleural effusion, minimal LLL GGO, mediastinal and right hilar lymphadenopathy and periportal lymphadenopathy of uncertain etiology - LE DVT neg - Echo with preserved EF, but small to moderate pericardial effusion without tamponade  -cardiac MRI 9/23 concerning for acute myocarditis however some evidence of small apical inferior wall infarct, unclear if acute or old.  Unable to determine EF.   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 DES -Triple therapy for 1 month planned with Eliquis , aspirin , Plavix  then discontinuing aspirin    PAF - no longer on amio - S/p heparin  drip now transitioned to Eliquis  on 08/11/2024  Acute urinary retention - Patient endorses voiding adequately at home prior to admission but does endorse having a low stream at times -Catheter placed on admission likely in setting of acute illness but given clinical improvement, reasonable for a trial of void -Discontinue Foley on 08/11/2024 and start bladder scans  - Continue scans; if fails again, may need foley replaced and outpt urology follow up   HTN - Continue Lasix  and Toprol  -Continue losartan    HA 9/21 CT CTA head/ neck neg - remains non- focal   Hyponatremia, resolved  - normalized, trend on labs    Hypophosphorous - Replete as needed  Interval History:  No events overnight.  Leg edema actually better this morning although he still feels they are swollen.  Otherwise doing okay.  Tentative discharge planned for tomorrow.   Old records reviewed in assessment of this patient  Antimicrobials: Zosyn  08/07/2024 >> 08/11/2024 Augmentin  08/11/2024 >> current  DVT prophylaxis:  SCDs Start: 08/07/24 1627 apixaban  (ELIQUIS ) tablet 5 mg   Code Status:   Code Status: Full Code  Mobility Assessment (Last 72 Hours)     Mobility Assessment     Row Name 08/12/24 2000 08/12/24 1400 08/12/24 0800 08/11/24 2015 08/11/24 2000   Does the patient have exclusion criteria? No - Perform mobility assessment -- No - Perform mobility assessment No - Perform mobility assessment No - Perform mobility assessment   What  is the highest level of mobility based on the mobility assessment? Level 4 (Ambulates with assistance) - Balance while stepping forward/back - Complete Level 4 (Ambulates with assistance) - Balance while stepping forward/back - Complete Level 4 (Ambulates with assistance) - Balance while  stepping forward/back - Complete Level 4 (Ambulates with assistance) - Balance while stepping forward/back - Complete Level 4 (Ambulates with assistance) - Balance while stepping forward/back - Complete   Is the above level different from baseline mobility prior to current illness? -- -- Yes - Recommend PT order Yes - Recommend PT order Yes - Recommend PT order    Row Name 08/11/24 1306 08/11/24 1304 08/11/24 0900 08/10/24 2000 08/10/24 1820   Does the patient have exclusion criteria? -- -- No - Perform mobility assessment No - Perform mobility assessment No - Perform mobility assessment   What is the highest level of mobility based on the mobility assessment? Level 2 (Chairfast) - Balance while sitting on edge of bed and cannot stand Level 4 (Ambulates with assistance) - Balance while stepping forward/back - Complete Level 2 (Chairfast) - Balance while sitting on edge of bed and cannot stand Level 2 (Chairfast) - Balance while sitting on edge of bed and cannot stand Level 1 (Bedfast) - Unable to balance while sitting on edge of bed   Is the above level different from baseline mobility prior to current illness? -- -- Yes - Recommend PT order Yes - Recommend PT order Yes - Recommend PT order    Row Name 08/10/24 1614           What is the highest level of mobility based on the mobility assessment? Level 4 (Ambulates with assistance) - Balance while stepping forward/back - Complete          Barriers to discharge: none Disposition Plan: Home HH orders placed: n/a Status is: Inpt  Objective: Blood pressure (!) 148/85, pulse 61, temperature 98 F (36.7 C), temperature source Oral, resp. rate 19, height 5' 11.5 (1.816 m), weight (P) 93.4 kg, SpO2 97%.  Examination:  Physical Exam Constitutional:      General: He is not in acute distress.    Appearance: Normal appearance.  HENT:     Head: Normocephalic and atraumatic.     Mouth/Throat:     Mouth: Mucous membranes are moist.  Eyes:      Extraocular Movements: Extraocular movements intact.  Cardiovascular:     Rate and Rhythm: Normal rate and regular rhythm.  Pulmonary:     Effort: Pulmonary effort is normal. No respiratory distress.     Breath sounds: Normal breath sounds. No wheezing.  Abdominal:     General: Bowel sounds are normal. There is no distension.     Palpations: Abdomen is soft.     Tenderness: There is no abdominal tenderness.  Musculoskeletal:        General: Normal range of motion.     Cervical back: Normal range of motion and neck supple.     Right lower leg: Edema (1+) present.     Left lower leg: Edema (1+) present.  Skin:    General: Skin is warm and dry.  Neurological:     General: No focal deficit present.     Mental Status: He is alert.  Psychiatric:        Mood and Affect: Mood normal.        Behavior: Behavior normal.      Consultants:  Cardiology Pulmonology  Procedures:  08/10/2024: Left heart cath  Data Reviewed: Results for orders placed or performed during the hospital encounter of 08/07/24 (from the past 24 hours)  Glucose, capillary     Status: Abnormal   Collection Time: 08/12/24 11:49 AM  Result Value Ref Range   Glucose-Capillary 155 (H) 70 - 99 mg/dL  Glucose, capillary     Status: Abnormal   Collection Time: 08/12/24  3:43 PM  Result Value Ref Range   Glucose-Capillary 184 (H) 70 - 99 mg/dL  Glucose, capillary     Status: Abnormal   Collection Time: 08/12/24  7:59 PM  Result Value Ref Range   Glucose-Capillary 163 (H) 70 - 99 mg/dL  Glucose, capillary     Status: None   Collection Time: 08/13/24 12:24 AM  Result Value Ref Range   Glucose-Capillary 78 70 - 99 mg/dL  Glucose, capillary     Status: None   Collection Time: 08/13/24  4:13 AM  Result Value Ref Range   Glucose-Capillary 86 70 - 99 mg/dL  Glucose, capillary     Status: Abnormal   Collection Time: 08/13/24  8:04 AM  Result Value Ref Range   Glucose-Capillary 149 (H) 70 - 99 mg/dL  Glucose,  capillary     Status: Abnormal   Collection Time: 08/13/24 11:23 AM  Result Value Ref Range   Glucose-Capillary 112 (H) 70 - 99 mg/dL    I have reviewed pertinent nursing notes, vitals, labs, and images as necessary. I have ordered labwork to follow up on as indicated.  I have reviewed the last notes from staff over past 24 hours. I have discussed patient's care plan and test results with nursing staff, CM/SW, and other staff as appropriate.  Time spent: Greater than 50% of the 55 minute visit was spent in counseling/coordination of care for the patient as laid out in the A&P.   LOS: 6 days   Alm Apo, MD [facility] 08/13/2024, 11:46 AM

## 2024-08-13 NOTE — Plan of Care (Signed)
  Problem: Coping: Goal: Ability to adjust to condition or change in health will improve Outcome: Progressing   Problem: Fluid Volume: Goal: Ability to maintain a balanced intake and output will improve Outcome: Progressing   Problem: Metabolic: Goal: Ability to maintain appropriate glucose levels will improve Outcome: Progressing   Problem: Clinical Measurements: Goal: Diagnostic test results will improve Outcome: Progressing   Problem: Activity: Goal: Risk for activity intolerance will decrease Outcome: Progressing   Problem: Elimination: Goal: Will not experience complications related to urinary retention Outcome: Progressing

## 2024-08-14 ENCOUNTER — Other Ambulatory Visit (HOSPITAL_COMMUNITY): Payer: Self-pay

## 2024-08-14 DIAGNOSIS — I309 Acute pericarditis, unspecified: Secondary | ICD-10-CM | POA: Diagnosis not present

## 2024-08-14 DIAGNOSIS — R579 Shock, unspecified: Secondary | ICD-10-CM | POA: Diagnosis not present

## 2024-08-14 DIAGNOSIS — I4891 Unspecified atrial fibrillation: Secondary | ICD-10-CM | POA: Diagnosis not present

## 2024-08-14 LAB — BASIC METABOLIC PANEL WITH GFR
Anion gap: 10 (ref 5–15)
BUN: 16 mg/dL (ref 8–23)
CO2: 25 mmol/L (ref 22–32)
Calcium: 8.2 mg/dL — ABNORMAL LOW (ref 8.9–10.3)
Chloride: 101 mmol/L (ref 98–111)
Creatinine, Ser: 0.84 mg/dL (ref 0.61–1.24)
GFR, Estimated: >60 mL/min (ref >60)
Glucose, Bld: 83 mg/dL (ref 70–99)
Potassium: 3.8 mmol/L (ref 3.5–5.1)
Sodium: 136 mmol/L (ref 135–145)

## 2024-08-14 LAB — GLUCOSE, CAPILLARY
Glucose-Capillary: 107 mg/dL — ABNORMAL HIGH (ref 70–99)
Glucose-Capillary: 157 mg/dL — ABNORMAL HIGH (ref 70–99)
Glucose-Capillary: 118 mg/dL — ABNORMAL HIGH (ref 70–99)

## 2024-08-14 MED ORDER — COLCHICINE 0.6 MG PO TABS
0.6000 mg | ORAL_TABLET | Freq: Two times a day (BID) | ORAL | 0 refills | Status: DC
  Filled 2024-08-14: qty 60, 30d supply, fill #0

## 2024-08-14 MED ORDER — METOPROLOL SUCCINATE ER 25 MG PO TB24
25.0000 mg | ORAL_TABLET | Freq: Every day | ORAL | 2 refills | Status: DC
  Filled 2024-08-14: qty 90, 90d supply, fill #0

## 2024-08-14 MED ORDER — ASPIRIN 81 MG PO TBEC: 81.0000 mg | DELAYED_RELEASE_TABLET | Freq: Every day | ORAL | Status: DC

## 2024-08-14 MED ORDER — APIXABAN 5 MG PO TABS
5.0000 mg | ORAL_TABLET | Freq: Two times a day (BID) | ORAL | 2 refills | Status: DC
  Filled 2024-08-14: qty 60, 30d supply, fill #0

## 2024-08-14 MED ORDER — LOSARTAN POTASSIUM 25 MG PO TABS
25.0000 mg | ORAL_TABLET | Freq: Every day | ORAL | 2 refills | Status: DC
  Filled 2024-08-14: qty 90, 90d supply, fill #0

## 2024-08-14 MED ORDER — CLOPIDOGREL BISULFATE 75 MG PO TABS
75.0000 mg | ORAL_TABLET | Freq: Every day | ORAL | 2 refills | Status: DC
  Filled 2024-08-14: qty 30, 30d supply, fill #0

## 2024-08-14 MED ORDER — FUROSEMIDE 40 MG PO TABS
40.0000 mg | ORAL_TABLET | Freq: Two times a day (BID) | ORAL | 2 refills | Status: DC
  Filled 2024-08-14: qty 60, 30d supply, fill #0

## 2024-08-14 MED ORDER — ROSUVASTATIN CALCIUM 20 MG PO TABS
20.0000 mg | ORAL_TABLET | Freq: Every day | ORAL | 2 refills | Status: DC
  Filled 2024-08-14: qty 90, 90d supply, fill #0

## 2024-08-14 NOTE — Discharge Summary (Signed)
 Physician Discharge Summary   Jeffrey Black. FMW:[id number] DOB: 10-30-1961 DOA: 08/07/2024  PCP: Jeffrey Heron HERO, MD  Admit date: 08/07/2024 Discharge date: 08/14/2024   Admitted From: Home Disposition:  Home Discharging physician: Alm Apo, MD Barriers to discharge: none  Recommendations at discharge: Follow up with cardiology Discuss colchicine  course length  Ensure asa stopped after 1 month and continue on Eliquis  and Plavix   Consider referral to urology for intermittent urinary retention issues at home  Discharge Condition: stable CODE STATUS: Full  Diet recommendation:  Diet Orders (From admission, onward)     Start     Ordered   08/14/24 0000  Diet - low sodium heart healthy        08/14/24 1007   08/10/24 1833  Diet Heart Room service appropriate? Yes; Fluid consistency: Thin  Diet effective now       Question Answer Comment  Room service appropriate? Yes   Fluid consistency: Thin      08/10/24 1832            Hospital Course: Jeffrey Black is a 63 yo male with PMH psoriatic arthritis, DM II, history of alcohol  abuse who presented with fever, malaise, chest pain, syncope. He was found to have refractory hypotension on admission requiring admission to the ICU and vasopressor support. CTA negative for dissection, minimal GGO in LLL, mediastinal/hilar lymphadenopathy and small pneumomediastinum. Reports decreased activity in the last 2 years. No recent travel. Lives alone. Has had multiple falls and balancing issues    Significant Hospital Events:  9/21 admit 9/22 pressor requirement improving, LE DVT neg 9/23 off pressors, trop trending down, still on Fowler but feeling more short of breath this AM with sinus tach/ PAF, EF 55-60, mod pericardial effusion w/o tamponade, no RWMA  A&P:  Shock, undifferentiated  Acute Hypoxic Respiratory Failure Pneumomediastinum, resolved Mediastinal adenopathy Pericardial effusion Viral myocarditis  Lactic acidosis   - 9/21 CTA c/a/p for dissection w/wo> neg for aortic dissection or aneurysm, small pneumomediastinum, trace left pleural effusion, minimal LLL GGO, mediastinal and right hilar lymphadenopathy and periportal lymphadenopathy of uncertain etiology - LE DVT neg - Echo with preserved EF, but small to moderate pericardial effusion without tamponade  -cardiac MRI 9/23 concerning for acute myocarditis however some evidence of small apical inferior wall infarct, unclear if acute or old.  Unable to determine EF.   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 treated with DES -Triple therapy for 1 month planned with Eliquis , aspirin , Plavix  then discontinuing aspirin    PAF - no longer on amio - S/p heparin  drip now transitioned to Eliquis  on 08/11/2024  Acute urinary retention -resolved - Patient endorses voiding adequately at home prior to admission but does endorse having a low stream at times -Catheter placed on admission likely in setting of acute illness but given clinical improvement, reasonable for a trial of void -Discontinue Foley on 08/11/2024 and start bladder scans - Able to void adequately with no further straight caths needed - Consider outpatient referral to urology given intermittent issue at home prior to hospitalization   HTN - Continue Lasix  and Toprol  -Continue losartan   HA 9/21 CT CTA head/ neck neg - remains non- focal   Hyponatremia, resolved  - normalized, trend on labs    Hypophosphorous - Repleted  Assessment and Plan: No notes have been filed under this hospital service. Service: Hospitalist      The patient's acute  and chronic medical conditions were treated accordingly. On day of discharge, patient was felt deemed stable for discharge. Patient/family member advised to call PCP or come back to ER if needed.   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     Discharge Instructions     Amb Referral to Cardiac Rehabilitation   Complete by: As directed    Diagnosis: Coronary Stents   After initial evaluation and assessments completed: Virtual Based Care may be provided alone or in conjunction with Phase 2 Cardiac Rehab based on patient barriers.: Yes   Intensive Cardiac Rehabilitation (ICR) [facility] location only OR Traditional Cardiac Rehabilitation (TCR) *If criteria for ICR are not met will enroll in TCR [facility] only): Yes   Diet - low sodium heart healthy   Complete by: As directed    Increase activity slowly   Complete by: As directed       Allergies as of 08/14/2024   No Known Allergies      Medication List     TAKE these medications    Accu-Chek Softclix Lancets lancets   aspirin  EC 81 MG tablet Take 1 tablet (81 mg total) by mouth daily for 25 days. Swallow whole. Start taking on: August 15, 2024   Blood Glucose Monitoring Suppl Devi 1 each by Does not apply route in the morning, at noon, and at bedtime. May substitute to any manufacturer covered by patient's insurance.   BLOOD GLUCOSE TEST STRIPS Strp 1 each by In Vitro route daily. May substitute to any manufacturer  covered by patient's insurance.   clopidogrel  75 MG tablet Commonly known as: PLAVIX  Take 1 tablet (75 mg total) by mouth daily with breakfast. Start taking on: August 15, 2024   colchicine  0.6 MG tablet Take 1 tablet (0.6 mg total) by mouth 2 (two) times daily.   Cosentyx  UnoReady 300 MG/2ML Soaj Generic drug: Secukinumab  Inject 300 mg into the skin every 28 (twenty-eight) days.   Eliquis  5 MG Tabs tablet Generic drug: apixaban  Take 1 tablet (5 mg total) by mouth 2 (two) times daily.   empagliflozin  25 MG Tabs tablet Commonly known as: Jardiance  Take 1 tablet (25 mg total) by mouth daily before breakfast.   furosemide  40 MG tablet Commonly known as: LASIX  Take 1 tablet (40 mg total) by mouth 2 (two) times daily.   losartan  25 MG tablet Commonly known as: COZAAR  Take 1 tablet (25 mg total) by mouth daily. Start taking on: August 15, 2024   metFORMIN  500 MG tablet Commonly known as: GLUCOPHAGE  Take 2 tablets (1,000 mg total) by mouth 2 (two) times daily with a meal.   metoprolol  succinate 25 MG 24 hr tablet Commonly known as: TOPROL -XL Take 1 tablet (25 mg total) by mouth  daily. Start taking on: August 15, 2024   olmesartan  5 MG tablet Commonly known as: BENICAR  Take 1 tablet (5 mg total) by mouth daily.   rosuvastatin  20 MG tablet Commonly known as: CRESTOR  Take 1 tablet (20 mg total) by mouth daily. Start taking on: August 15, 2024 What changed:  medication strength how much to take        Follow-up Information     [facility] Follow up.   Why: Home health has been arranged. They will all to schedule apt within 48 hrs post discharge. Contact information: [address] [place] [address] [phone]         Jeffrey Wilbert SAUNDERS, MD. Schedule an appointment as soon as possible for a visit in 1 week(s).   Specialty: Cardiology Contact information: [address] [place] [address] [phone]          Jeffrey Heron HERO, MD. Schedule an appointment as soon as possible for a visit in 2 week(s).   Specialty: Family Medicine Contact information: [address] [place] [address] [phone]                No Known Allergies  Consultations: Cardiology  Procedures: 08/10/2024: Left heart cath  Discharge Exam: BP (!) 142/77   Pulse 65   Temp 98.1 F (36.7 C) (Oral)   Resp 16   Ht 5' 11.5 (1.816 m)   Wt 91 kg   SpO2 98%   BMI 27.59 kg/m  Physical Exam Constitutional:      General: He is not in acute distress.    Appearance: Normal appearance.  HENT:     Head: Normocephalic and atraumatic.     Mouth/Throat:     Mouth: Mucous membranes are moist.  Eyes:     Extraocular Movements: Extraocular movements intact.  Cardiovascular:     Rate and Rhythm: Normal rate and regular rhythm.  Pulmonary:     Effort: Pulmonary effort is normal. No respiratory distress.     Breath sounds: Normal breath sounds. No wheezing.  Abdominal:     General: Bowel sounds are normal. There is no distension.     Palpations: Abdomen is soft.     Tenderness: There is no abdominal tenderness.  Musculoskeletal:        General: Normal range of motion.     Cervical back: Normal range of motion and neck supple.     Right lower leg: No edema.     Left lower leg: No edema.  Skin:    General: Skin is warm and dry.  Neurological:     General: No focal deficit present.     Mental Status: He is alert.  Psychiatric:        Mood and Affect: Mood normal.        Behavior: Behavior normal.      The results of significant diagnostics from this hospitalization (including imaging, microbiology, ancillary and laboratory) are listed below for reference.   Microbiology: Recent Results (from the past 240 hours)  MRSA Next Gen by PCR, Nasal     Status: None   Collection Time: 08/07/24  4:27 PM   Specimen: Nasal Mucosa; Nasal Swab  Result Value Ref Range Status   MRSA by PCR Next Gen  NOT DETECTED NOT DETECTED Final    Comment: (NOTE) The GeneXpert MRSA Assay (FDA approved for NASAL specimens only), is one component of a comprehensive MRSA colonization surveillance program. It is not intended to  diagnose MRSA infection nor to guide or monitor treatment for MRSA infections. Test performance is not FDA approved in patients less than 75 years old. Performed at [facility], [address], [place], [place] [address]   SARS Coronavirus 2 by RT PCR (hospital order, performed in [facility] hospital lab) *cepheid single result test* Nasopharyngeal Swab     Status: None   Collection Time: 08/07/24  4:27 PM   Specimen: Nasopharyngeal Swab; Nasal Swab  Result Value Ref Range Status   SARS Coronavirus 2 by RT PCR NEGATIVE NEGATIVE Final    Comment: Performed at [facility], [address], [place], [place] [address]  Respiratory (~20 pathogens) panel by PCR     Status: None   Collection Time: 08/07/24  4:39 PM   Specimen: Nasopharyngeal Swab; Respiratory  Result Value Ref Range Status   Adenovirus NOT DETECTED NOT DETECTED Final   Coronavirus 229E NOT DETECTED NOT DETECTED Final    Comment: (NOTE) The Coronavirus on the Respiratory Panel, DOES NOT test for the novel  Coronavirus (2019 nCoV)    Coronavirus HKU1 NOT DETECTED NOT DETECTED Final   Coronavirus NL63 NOT DETECTED NOT DETECTED Final   Coronavirus OC43 NOT DETECTED NOT DETECTED Final   Metapneumovirus NOT DETECTED NOT DETECTED Final   Rhinovirus / Enterovirus NOT DETECTED NOT DETECTED Final   Influenza A NOT DETECTED NOT DETECTED Final   Influenza B NOT DETECTED NOT DETECTED Final   Parainfluenza Virus 1 NOT DETECTED NOT DETECTED Final   Parainfluenza Virus 2 NOT DETECTED NOT DETECTED Final   Parainfluenza Virus 3 NOT DETECTED NOT DETECTED Final   Parainfluenza Virus 4 NOT DETECTED NOT DETECTED Final   Respiratory Syncytial Virus NOT DETECTED NOT DETECTED Final   Bordetella pertussis NOT DETECTED  NOT DETECTED Final   Bordetella Parapertussis NOT DETECTED NOT DETECTED Final   Chlamydophila pneumoniae NOT DETECTED NOT DETECTED Final   Mycoplasma pneumoniae NOT DETECTED NOT DETECTED Final    Comment: Performed at [facility], [address] [place], [place] [address]  Culture, blood (Routine X 2) w Reflex to ID Panel     Status: None   Collection Time: 08/07/24  5:40 PM   Specimen: BLOOD RIGHT HAND  Result Value Ref Range Status   Specimen Description BLOOD RIGHT HAND  Final   Special Requests   Final    BOTTLES DRAWN AEROBIC AND ANAEROBIC Blood Culture results may not be optimal due to an inadequate volume of blood received in culture bottles   Culture   Final    NO GROWTH 5 DAYS Performed at [facility], [address], [place], [place] [address]    Report Status 08/12/2024 FINAL  Final  Culture, blood (Routine X 2) w Reflex to ID Panel     Status: None   Collection Time: 08/07/24  5:45 PM   Specimen: BLOOD RIGHT HAND  Result Value Ref Range Status   Specimen Description BLOOD RIGHT HAND  Final   Special Requests   Final    AEROBIC BOTTLE ONLY Blood Culture results may not be optimal due to an inadequate volume of blood received in culture bottles   Culture   Final    NO GROWTH 5 DAYS Performed at [facility], [address], [place], [place] [address]    Report Status 08/12/2024 FINAL  Final     Labs: BNP (last 3 results) Recent Labs    08/07/24 1529 08/09/24 1443  BNP  190.5* 436.5*   Basic Metabolic Panel: Recent Labs  Lab 08/08/24 0203 08/09/24 0416 08/10/24 0021 08/11/24 0231 08/11/24 0829 08/12/24 0253 08/14/24 0356  NA 131* 133* 135 136 137 137 136  K 4.3 4.3 3.9 4.1 4.0 3.8 3.8  CL 103 103 104 103 104 103 101  CO2 16* 19* 19* 22 20* 23 25  GLUCOSE 114* 114* 145* 159* 148* 93 83  BUN 17 21 22 21 21  25* 16  CREATININE 1.10 0.97 0.96 1.09 1.00 0.78 0.84  CALCIUM  8.4* 8.2* 7.9* 7.8* 8.2* 7.8* 8.2*  MG 1.9 2.4 2.4 2.5*  --  2.6*   --   PHOS 3.2 2.6 2.0* 2.1*  --  2.3*  --    Liver Function Tests: Recent Labs  Lab 08/07/24 1529  AST 27  ALT 40  ALKPHOS 40  BILITOT 0.5  PROT 5.7*  ALBUMIN  2.9*   Recent Labs  Lab 08/07/24 1529  LIPASE 11   No results for input(s): AMMONIA in the last 168 hours. CBC: Recent Labs  Lab 08/07/24 1529 08/07/24 1533 08/09/24 0416 08/10/24 0021 08/11/24 0231 08/11/24 0829 08/12/24 0253  WBC 17.7*   < > 13.8* 14.2* 13.4* 19.1* 13.6*  NEUTROABS 15.0*  --   --   --   --   --   --   HGB 11.6*   < > 10.9* 10.3* 10.4* 11.5* 10.2*  HCT 34.9*   < > 33.2* 30.4* 31.7* 34.6* 31.5*  MCV 86.8   < > 86.7 85.9 86.8 85.9 86.8  PLT 196   < > 227 220 281 368 320   < > = values in this interval not displayed.   Cardiac Enzymes: No results for input(s): CKTOTAL, CKMB, CKMBINDEX, TROPONINI in the last 168 hours. BNP: Invalid input(s): POCBNP CBG: Recent Labs  Lab 08/13/24 2008 08/13/24 2312 08/14/24 0618 08/14/24 0825 08/14/24 1142  GLUCAP 150* 102* 107* 157* 118*   D-Dimer No results for input(s): DDIMER in the last 72 hours. Hgb A1c No results for input(s): HGBA1C in the last 72 hours. Lipid Profile No results for input(s): CHOL, HDL, LDLCALC, TRIG, CHOLHDL, LDLDIRECT in the last 72 hours. Thyroid  function studies No results for input(s): TSH, T4TOTAL, T3FREE, THYROIDAB in the last 72 hours.  Invalid input(s): FREET3 Anemia work up No results for input(s): VITAMINB12, FOLATE, FERRITIN, TIBC, IRON, RETICCTPCT in the last 72 hours. Urinalysis    Component Value Date/Time   COLORURINE YELLOW 08/07/2024 2300   APPEARANCEUR CLEAR 08/07/2024 2300   LABSPEC >1.046 (H) 08/07/2024 2300   PHURINE 5.0 08/07/2024 2300   GLUCOSEU >=500 (A) 08/07/2024 2300   HGBUR SMALL (A) 08/07/2024 2300   BILIRUBINUR NEGATIVE 08/07/2024 2300   KETONESUR 5 (A) 08/07/2024 2300   PROTEINUR NEGATIVE 08/07/2024 2300   UROBILINOGEN 0.2 08/01/2013  2047   NITRITE NEGATIVE 08/07/2024 2300   LEUKOCYTESUR NEGATIVE 08/07/2024 2300   Sepsis Labs Recent Labs  Lab 08/10/24 0021 08/11/24 0231 08/11/24 0829 08/12/24 0253  WBC 14.2* 13.4* 19.1* 13.6*   Microbiology Recent Results (from the past 240 hours)  MRSA Next Gen by PCR, Nasal     Status: None   Collection Time: 08/07/24  4:27 PM   Specimen: Nasal Mucosa; Nasal Swab  Result Value Ref Range Status   MRSA by PCR Next Gen NOT DETECTED NOT DETECTED Final    Comment: (NOTE) The GeneXpert MRSA Assay (FDA approved for NASAL specimens only), is one component of a comprehensive MRSA colonization surveillance program. It is not  intended to diagnose MRSA infection nor to guide or monitor treatment for MRSA infections. Test performance is not FDA approved in patients less than 23 years old. Performed at [facility], [address], [place], [place] [address]   SARS Coronavirus 2 by RT PCR (hospital order, performed in [facility] hospital lab) *cepheid single result test* Nasopharyngeal Swab     Status: None   Collection Time: 08/07/24  4:27 PM   Specimen: Nasopharyngeal Swab; Nasal Swab  Result Value Ref Range Status   SARS Coronavirus 2 by RT PCR NEGATIVE NEGATIVE Final    Comment: Performed at [facility], [address], [place], [place] [address]  Respiratory (~20 pathogens) panel by PCR     Status: None   Collection Time: 08/07/24  4:39 PM   Specimen: Nasopharyngeal Swab; Respiratory  Result Value Ref Range Status   Adenovirus NOT DETECTED NOT DETECTED Final   Coronavirus 229E NOT DETECTED NOT DETECTED Final    Comment: (NOTE) The Coronavirus on the Respiratory Panel, DOES NOT test for the novel  Coronavirus (2019 nCoV)    Coronavirus HKU1 NOT DETECTED NOT DETECTED Final   Coronavirus NL63 NOT DETECTED NOT DETECTED Final   Coronavirus OC43 NOT DETECTED NOT DETECTED Final   Metapneumovirus NOT DETECTED NOT DETECTED Final   Rhinovirus / Enterovirus NOT  DETECTED NOT DETECTED Final   Influenza A NOT DETECTED NOT DETECTED Final   Influenza B NOT DETECTED NOT DETECTED Final   Parainfluenza Virus 1 NOT DETECTED NOT DETECTED Final   Parainfluenza Virus 2 NOT DETECTED NOT DETECTED Final   Parainfluenza Virus 3 NOT DETECTED NOT DETECTED Final   Parainfluenza Virus 4 NOT DETECTED NOT DETECTED Final   Respiratory Syncytial Virus NOT DETECTED NOT DETECTED Final   Bordetella pertussis NOT DETECTED NOT DETECTED Final   Bordetella Parapertussis NOT DETECTED NOT DETECTED Final   Chlamydophila pneumoniae NOT DETECTED NOT DETECTED Final   Mycoplasma pneumoniae NOT DETECTED NOT DETECTED Final    Comment: Performed at [facility], [address], [place], [place] [address]  Culture, blood (Routine X 2) w Reflex to ID Panel     Status: None   Collection Time: 08/07/24  5:40 PM   Specimen: BLOOD RIGHT HAND  Result Value Ref Range Status   Specimen Description BLOOD RIGHT HAND  Final   Special Requests   Final    BOTTLES DRAWN AEROBIC AND ANAEROBIC Blood Culture results may not be optimal due to an inadequate volume of blood received in culture bottles   Culture   Final    NO GROWTH 5 DAYS Performed at [facility], [address] [place], [place] [address]    Report Status 08/12/2024 FINAL  Final  Culture, blood (Routine X 2) w Reflex to ID Panel     Status: None   Collection Time: 08/07/24  5:45 PM   Specimen: BLOOD RIGHT HAND  Result Value Ref Range Status   Specimen Description BLOOD RIGHT HAND  Final   Special Requests   Final    AEROBIC BOTTLE ONLY Blood Culture results may not be optimal due to an inadequate volume of blood received in culture bottles   Culture   Final    NO GROWTH 5 DAYS Performed at [facility], [address] [place], [place] [address]    Report Status 08/12/2024 FINAL  Final    Procedures/Studies: DG Chest Port 1 View Result Date: 08/11/2024 EXAM: 1 VIEW(S) XRAY OF THE  CHEST 08/11/2024 07:34:00  AM COMPARISON: 08/09/2024 CLINICAL HISTORY: Hypoxia FINDINGS: LUNGS AND PLEURA: Small left pleural effusion. Increased diffuse interstitial markings with increased left retrocardiac opacity. No pulmonary edema. No pneumothorax. HEART AND MEDIASTINUM: Cardiomegaly. BONES AND SOFT TISSUES: No acute osseous abnormality. IMPRESSION: 1. Increased left retrocardiac opacity, which may reflect atelectasis or developing consolidation. 2. Diffuse increased interstitial markings concerning for pulmonary edema. 3. Small left pleural effusion. 4. Cardiomegaly. Electronically signed by: Waddell Calk MD 08/11/2024 08:16 AM EDT RP Workstation: [id number]   CARDIAC CATHETERIZATION Result Date: 08/10/2024 Table formatting from the original result was not included. Images from the original result were not included.   Likely culprit lesion: Dist RCA lesion is 65% stenosed (focal ulcerated plaque) with 10% stenosed RPA V side branch in RPAV.   A drug-eluting stent was successfully placed from a branch into the RPA V sidebranch (across the RPDA) using a STENT SYNERGY XD [id number] in the main branch and side branch.  Stent was deployed at 3.8 mm and postdilated to 4.1 mm in the main branch.   Post intervention, there is a 0% residual stenosis. Post intervention, the side branch was reduced to 0% residual stenosis.  TIMI-3 flow restored.   --------------------------------------------------------------------------- -----   Mid LAD lesion is 20% stenosed with 20% stenosed side branch in 2nd Diag.   --------------------------------------------------------------------------- -----   LV end diastolic pressure is severely elevated.   There is no aortic valve stenosis. Diagnostic: Dominance: Right                                                          Intervention  Likely culprit lesion for MI (with inferior MI seen on MRI) is a ulcerated plaque (65%) in the mid RCA just proximal to a high bifurcation into PDA and PAV.   Otherwise the rest of  the RCA with several PL branches and PDA are free of disease. Successful DES PCI of 65% ulcerated plaque mid RCA stenting from RCA into RPA V with 3.5 mm x 20 mm Synergy XD deployed 3.8 mm, and postdilated proximally to 4.1 mm upstream of the bifurcation.  The stent was fully expanded however there still remains a small ledge of the plaque outside of the stent but notable improvement of flow. = Lesion essentially reduced to 0%> LCA: Other than 20% mid LAD at D1 takeoff, angiographically minimal disease.  1 small 1 moderate caliber diagonal branch with extensive bifurcation.  LCx terminates as an OM1-OM2 with an AV groove branch and small posterolateral branch. Severely  elevated LVEDP of roughly 30 mmHg consistent with Acute Diastolic Heart Failure Treated with 40 mg IV Lasix  in the Cath Lab.  RECOMMENDATIONS   The patient was loaded with ticagrelor /Brilinta  in the Cath Lab, but it became known that he would likely require DOAC and therefore the plan is to reload with Plavix /clopidogrel  the morning of 08/11/2024 and then continue Plavix  75 mg daily from then on   Recommend to resume undecided DOAC, at currently prescribed dose and frequency on 08/11/2024.   Recommend concurrent antiplatelet therapy of Aspirin  81 mg for 1 month and Clopidogrel  75mg  daily for 6 months  If there is increased bleeding risk concern, could consider shortening aspirin  to 2 weeks .   Due to significant elevated LVEDP, he was given 40 mg IV Lasix  in  the Cath Lab, would reassess volume status in the morning and strongly consider additional diuresis. PLAN OF CARE: Continue ongoing care for URI and likely myocarditis.  I suspect that the MRI and RCA lesion were incidental findings.  I have written to convert from Brilinta  to clopidogrel  (loading dose 300 mg morning 08/11/2024 and then 25 mg daily after that, provided that the plan is to place the patient on a DOAC.  With elevated EDP of roughly 30 mmHg, would likely consider additional diuresis  (he was given 40 mg IV Lasix  in the Cath Lab and would assess in the morning.  Once stable from a cardiovascular standpoint, can begin to titrate GDMT.  Alm Clay  MR CARDIAC MORPHOLOGY W WO CONTRAST Result Date: 08/09/2024 CLINICAL DATA:  64M p/w shock, chest pain, troponin elevated to 2114. Echo with EF 50-55%, moderate pericardial effusion. EXAM: CARDIAC MRI TECHNIQUE: The patient was scanned on a 1.5 Tesla Siemens magnet. A dedicated cardiac coil was used. Functional imaging was done using Fiesta sequences. 2,3, and 4 chamber views were done to assess for RWMA's. Modified Simpson's rule using a short axis stack was used to calculate an ejection fraction on a dedicated work Research officer, trade union. The patient received 10 cc of Gadavist . After 10 minutes inversion recovery sequences were used to assess for infiltration and scar tissue. Phase contrast velocity mapping was performed CONTRAST:  10 cc  of Gadavist  FINDINGS: Left ventricle: -Cine images were not interpretable due to artifact from cardiac/respiratory motion. Unable to calculate LV volumes and EF -Subendocardial LGE in apical inferior wall consistent with small infarct -Elevated myocardial T1/T2/ECV values consistent with myocardial edema. -RV insertion site LGE, which can be seen in setting of elevated pulmonary pressures Right ventricle: Cine images were not interpretable due to artifact from cardiac/respiratory motion. 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 NO pericardial LGE IMPRESSION: 1. Cine images were not interpretable due to artifact from cardiac/respiratory motion. Unable to calculate LV/RV volumes and EF 2. Subendocardial LGE in apical  inferior wall consistent with small infarct 3. Elevated myocardial T1/T2/ECV values consistent with myocardial edema 4. Moderate pericardial effusion measuring up to 1.5cm adjacent to LV inferior wall 5.  Dilated ascending aorta measuring 40mm 6. Dilated main pulmonary artery measuring 34mm. RV insertion site LGE, which can be seen in setting of elevated pulmonary pressures 7. Clinical presentation suggests myocarditis, and meets criteria for acute myocarditis on CMR with regional elevation in T1/T2/ECV levels. However, given LGE imaging shows evidence of small infarct in apical inferior wall, would be important to clarify if this is an acute or old infarct. Recommend cardiac catheterization. Given elevated T1/T2 levels are not just located around the infarct, but in multiple regions throughout the LV, suspect more likely myocarditis but important to rule out acute infarct Electronically Signed   By: Lonni Nanas M.D.   On: 08/09/2024 22:43   ECHOCARDIOGRAM LIMITED Result Date: 08/09/2024    ECHOCARDIOGRAM LIMITED REPORT   Patient Name:   Rito Lecomte. Date of Exam: 08/09/2024 Medical Rec #:  [id number]            Height:       71.5 in Accession #:    [id number]  Weight:       210.3 lb Date of Birth:  1961/05/20             BSA:          2.165 m Patient Age:    63 years             BP:           93/70 mmHg Patient Gender: M                    HR:           84 bpm. Exam Location:  Inpatient Procedure: Limited Echo and Cardiac Doppler (Both Spectral and Color Flow            Doppler were utilized during procedure). Indications:    Pericardial Effusion I31.3  History:        Patient has prior history of Echocardiogram examinations, most                 recent 08/08/2024.  Sonographer:    Tinnie Gosling RDCS Referring Phys: [phone] TRACI R TURNER IMPRESSIONS  1. Left ventricular ejection fraction, by estimation, is 50 to 55%. The left ventricle has low normal function. There is mild concentric left  ventricular hypertrophy.  2. Right ventricular systolic function is mildly reduced. Tricuspid regurgitation signal is inadequate for assessing PA pressure.  3. Moderate pericardial effusion. The pericardial effusion is circumferential. There is no evidence of cardiac tamponade.  4. The inferior vena cava is dilated in size with <50% respiratory variability, suggesting right atrial pressure of 20 mmHg. Comparison(s): No significant change from prior study. Prior images reviewed side by side. FINDINGS  Left Ventricle: Left ventricular ejection fraction, by estimation, is 50 to 55%. The left ventricle has low normal function. The left ventricular internal cavity size was normal in size. There is mild concentric left ventricular hypertrophy. Right Ventricle: Right ventricular systolic function is mildly reduced. Tricuspid regurgitation signal is inadequate for assessing PA pressure. Pericardium: A moderately sized pericardial effusion is present. The pericardial effusion is circumferential. There is no evidence of cardiac tamponade. Venous: The inferior vena cava is dilated in size with less than 50% respiratory variability, suggesting right atrial pressure of 15 mmHg. LEFT VENTRICLE PLAX 2D LVIDd:         4.60 cm LVIDs:         2.80 cm LV PW:         1.20 cm LV IVS:        1.20 cm  IVC IVC diam: 2.90 cm Stanly Leavens MD Electronically signed by Stanly Leavens MD Signature Date/Time: 08/09/2024/11:43:30 AM    Final    DG Chest Port 1 View Result Date: 08/09/2024 CLINICAL DATA:  [id number] Wheezing [id number]. EXAM: PORTABLE CHEST 1 VIEW COMPARISON:  08/08/2024. FINDINGS: Mild coarse interstitial markings are grossly similar to the prior study. Bilateral lung fields are clear. No acute consolidation, lung collapse or pulmonary edema. Bilateral costophrenic angles are clear. Redemonstration of elevated right hemidiaphragm. Stable cardio-mediastinal silhouette. No acute osseous abnormalities. The soft tissues are  within normal limits. IMPRESSION: No active disease. Electronically Signed   By: Ree Molt M.D.   On: 08/09/2024 08:15   VAS US  LOWER EXTREMITY VENOUS (DVT) Result Date: 08/08/2024  Lower Venous DVT Study Patient Name:  Juniper Snyders.  Date of Exam:   08/08/2024 Medical Rec #: [id number]             Accession #:  [id number] Date of Birth: [birth year]              Patient Gender: M Patient Age:   72 years Exam Location:  [facility] Procedure:      VAS US  LOWER EXTREMITY VENOUS (DVT) Referring Phys: LEITA GLEASON --------------------------------------------------------------------------------  Indications: Acute hypoxic respiratory failure.  Comparison Study: No previous exams Performing Technologist: Jody Hill RVT, RDMS  Examination Guidelines: A complete evaluation includes B-mode imaging, spectral Doppler, color Doppler, and power Doppler as needed of all accessible portions of each vessel. Bilateral testing is considered an integral part of a complete examination. Limited examinations for reoccurring indications may be performed as noted. The reflux portion of the exam is performed with the patient in reverse Trendelenburg.  +---------+---------------+---------+-----------+----------+--------------+ RIGHT    CompressibilityPhasicitySpontaneityPropertiesThrombus Aging +---------+---------------+---------+-----------+----------+--------------+ CFV      Full           Yes      Yes                                 +---------+---------------+---------+-----------+----------+--------------+ SFJ      Full                                                        +---------+---------------+---------+-----------+----------+--------------+ FV Prox  Full           Yes      Yes                                 +---------+---------------+---------+-----------+----------+--------------+ FV Mid   Full           Yes      Yes                                  +---------+---------------+---------+-----------+----------+--------------+ FV DistalFull           Yes      Yes                                 +---------+---------------+---------+-----------+----------+--------------+ PFV      Full                                                        +---------+---------------+---------+-----------+----------+--------------+ POP      Full           Yes      Yes                                 +---------+---------------+---------+-----------+----------+--------------+ PTV      Full                                                        +---------+---------------+---------+-----------+----------+--------------+  PERO     Full                                                        +---------+---------------+---------+-----------+----------+--------------+   +---------+---------------+---------+-----------+-------------+--------------+ LEFT     CompressibilityPhasicitySpontaneityProperties   Thrombus Aging +---------+---------------+---------+-----------+-------------+--------------+ CFV      Full           Yes      Yes                                    +---------+---------------+---------+-----------+-------------+--------------+ SFJ      Full                               rouleaux flow               +---------+---------------+---------+-----------+-------------+--------------+ FV Prox  Full           Yes      Yes                                    +---------+---------------+---------+-----------+-------------+--------------+ FV Mid   Full           Yes      Yes                                    +---------+---------------+---------+-----------+-------------+--------------+ FV DistalFull           Yes      Yes                                    +---------+---------------+---------+-----------+-------------+--------------+ PFV      Full                                                            +---------+---------------+---------+-----------+-------------+--------------+ POP      Full           Yes      Yes                                    +---------+---------------+---------+-----------+-------------+--------------+ PTV      Full                                                           +---------+---------------+---------+-----------+-------------+--------------+ PERO     Full                                                           +---------+---------------+---------+-----------+-------------+--------------+  Summary: BILATERAL: - No evidence of deep vein thrombosis seen in the lower extremities, bilaterally. -No evidence of popliteal cyst, bilaterally.   *See table(s) above for measurements and observations. Electronically signed by Jeffrey Robertson on 08/08/2024 at 4:47:20 PM.    Final    ECHOCARDIOGRAM COMPLETE Result Date: 08/08/2024    ECHOCARDIOGRAM REPORT   Patient Name:   Norman Piacentini. Date of Exam: 08/08/2024 Medical Rec #:  [id number]            Height:       71.5 in Accession #:    [id number]           Weight:       214.5 lb Date of Birth:  Jul 04, 1961             BSA:          2.184 m Patient Age:    63 years             BP:           85/58 mmHg Patient Gender: M                    HR:           104 bpm. Exam Location:  Inpatient Procedure: 2D Echo, Cardiac Doppler and Color Doppler (Both Spectral and Color            Flow Doppler were utilized during procedure). Indications:    R07.9* Chest pain, unspecified  History:        Patient has prior history of Echocardiogram examinations, most                 recent 07/24/2013. Previous Myocardial Infarction; Risk                 Factors:Hypertension and Diabetes. ETOH. Shock.  Sonographer:    Ellouise Mose RDCS Referring Phys: [id number] LAURA R GLEASON IMPRESSIONS  1. Left ventricular ejection fraction, by estimation, is 55 to 60%. The left ventricle has normal function. The left ventricle has no regional wall motion  abnormalities. There is moderate concentric left ventricular hypertrophy. Left ventricular diastolic parameters are consistent with Grade I diastolic dysfunction (impaired relaxation).  2. Right ventricular systolic function is mildly reduced. The right ventricular size is normal. Tricuspid regurgitation signal is inadequate for assessing PA pressure.  3. The mitral valve is normal in structure. Trivial mitral valve regurgitation. No evidence of mitral stenosis.  4. The aortic valve is tricuspid. There is moderate calcification of the aortic valve. Aortic valve regurgitation is not visualized. Aortic valve sclerosis/calcification is present, without any evidence of aortic stenosis.  5. Aortic dilatation noted. There is borderline dilatation of the aortic root, measuring 38 mm.  6. The inferior vena cava is dilated in size with <50% respiratory variability, suggesting right atrial pressure of 15 mmHg.  7. Small to moderate circumferential pericardial effusion. The IVC is dilated but there is no RV diastolic collapse. There is < 25% respirophasic variation of mitral inflow E wave. There does not appear to be pericardial tamponade. FINDINGS  Left Ventricle: Left ventricular ejection fraction, by estimation, is 55 to 60%. The left ventricle has normal function. The left ventricle has no regional wall motion abnormalities. The left ventricular internal cavity size was normal in size. There is  moderate concentric left ventricular hypertrophy. Left ventricular diastolic parameters are consistent with Grade I diastolic dysfunction (impaired relaxation). Right Ventricle: The right ventricular size is normal. No increase  in right ventricular wall thickness. Right ventricular systolic function is mildly reduced. Tricuspid regurgitation signal is inadequate for assessing PA pressure. Left Atrium: Left atrial size was normal in size. Right Atrium: Right atrial size was normal in size. Pericardium: Small to moderate  circumferential pericardial effusion. The IVC is dilated but there is no RV diastolic collapse. There is < 25% respirophasic variation of mitral inflow E wave. There does not appear to be pericardial tamponade. Mitral Valve: The mitral valve is normal in structure. Trivial mitral valve regurgitation. No evidence of mitral valve stenosis. Tricuspid Valve: The tricuspid valve is normal in structure. Tricuspid valve regurgitation is not demonstrated. Aortic Valve: The aortic valve is tricuspid. There is moderate calcification of the aortic valve. Aortic valve regurgitation is not visualized. Aortic valve sclerosis/calcification is present, without any evidence of aortic stenosis. Pulmonic Valve: The pulmonic valve was normal in structure. Pulmonic valve regurgitation is trivial. Aorta: Aortic dilatation noted. There is borderline dilatation of the aortic root, measuring 38 mm. Venous: The inferior vena cava is dilated in size with less than 50% respiratory variability, suggesting right atrial pressure of 15 mmHg. IAS/Shunts: No atrial level shunt detected by color flow Doppler.  LEFT VENTRICLE PLAX 2D LVIDd:         3.90 cm     Diastology LVIDs:         3.10 cm     LV e' medial:    6.36 cm/s LV PW:         1.60 cm     LV E/e' medial:  11.4 LV IVS:        1.90 cm     LV e' lateral:   5.55 cm/s LVOT diam:     2.40 cm     LV E/e' lateral: 13.1 LV SV:         67 LV SV Index:   30 LVOT Area:     4.52 cm  LV Volumes (MOD) LV vol d, MOD A2C: 60.5 ml LV vol d, MOD A4C: 73.9 ml LV vol s, MOD A2C: 41.5 ml LV vol s, MOD A4C: 25.3 ml LV SV MOD A2C:     19.0 ml LV SV MOD A4C:     73.9 ml LV SV MOD BP:      30.7 ml RIGHT VENTRICLE             IVC RV S prime:     15.30 cm/s  IVC diam: 2.70 cm TAPSE (M-mode): 1.9 cm LEFT ATRIUM             Index        RIGHT ATRIUM           Index LA diam:        3.40 cm 1.56 cm/m   RA Area:     16.70 cm LA Vol (A2C):   36.0 ml 16.49 ml/m  RA Volume:   43.70 ml  20.01 ml/m LA Vol (A4C):   25.9 ml  11.86 ml/m LA Biplane Vol: 32.7 ml 14.98 ml/m  AORTIC VALVE LVOT Vmax:   91.70 cm/s LVOT Vmean:  61.600 cm/s LVOT VTI:    0.147 m  AORTA Ao Root diam: 3.80 cm Ao Asc diam:  3.90 cm MITRAL VALVE MV Area (PHT): 6.77 cm    SHUNTS MV Decel Time: 112 msec    Systemic VTI:  0.15 m MV E velocity: 72.70 cm/s  Systemic Diam: 2.40 cm MV A velocity: 83.66 cm/s MV E/A ratio:  0.87  [employer] Electronically signed by Ezra Kanner Signature Date/Time: 08/08/2024/3:03:53 PM    Final    DG CHEST PORT 1 VIEW Result Date: 08/08/2024 CLINICAL DATA:  33352 Pneumomediastinum (HCC) 33352 EXAM: PORTABLE CHEST - 1 VIEW COMPARISON:  08/01/2013 FINDINGS: Patchy retrocardiac airspace opacities. No pleural effusion or pneumothorax. Mild cardiomegaly. Tortuous aorta with aortic atherosclerosis. No acute fracture or destructive lesions. Multilevel thoracic osteophytosis. IMPRESSION: 1. Patchy retrocardiac airspace opacities, likely reflecting atelectasis. 2. No pneumomediastinum. Electronically Signed   By: Rogelia Myers M.D.   On: 08/08/2024 09:16   US  EKG SITE RITE Result Date: 08/07/2024 If Site Rite image not attached, placement could not be confirmed due to current cardiac rhythm.  CT ANGIO HEAD NECK W WO CM Result Date: 08/07/2024 CLINICAL DATA:  Vertebral artery dissection suspected. Syncope. Chest pain. EXAM: CT ANGIOGRAPHY HEAD AND NECK WITH AND WITHOUT CONTRAST TECHNIQUE: Multidetector CT imaging of the head and neck was performed using the standard protocol during bolus administration of intravenous contrast. Multiplanar CT image reconstructions and MIPs were obtained to evaluate the vascular anatomy. Carotid stenosis measurements (when applicable) are obtained utilizing NASCET criteria, using the distal internal carotid diameter as the denominator. RADIATION DOSE REDUCTION: This exam was performed according to the departmental dose-optimization program which includes automated exposure control, adjustment of the  mA and/or kV according to patient size and/or use of iterative reconstruction technique. CONTRAST:  OMNIPAQUE  IOHEXOL  350 MG/ML SOLN COMPARISON:  Head CT 06/02/2015 FINDINGS: CT HEAD FINDINGS Brain: There is no evidence of an acute infarct, intracranial hemorrhage, mass, midline shift, or extra-axial fluid collection. Cerebral white matter hypodensities are nonspecific but compatible with mild chronic small vessel ischemic disease. There is mild cerebellar greater than cerebral atrophy. Vascular: Calcified atherosclerosis at the skull base. Skull: No fracture or suspicious lesion. Sinuses/Orbits: Paranasal sinuses and mastoid air cells are clear. Unremarkable orbits. Other: None. Review of the MIP images confirms the above findings CTA NECK FINDINGS Aortic arch: Standard branching with mild atherosclerosis. Widely patent brachiocephalic and left subclavian arteries. The right subclavian artery appears patent proximally but is otherwise largely obscured by streak artifact from dense venous contrast. Right carotid system: The proximal common carotid artery is obscured by streak artifact, however the vessel is widely patent and normal in appearance more distally. The cervical ICA is patent with mild scattered plaque not resulting in significant stenosis. Left carotid system: Patent with a small amount of plaque at the carotid bifurcation and distal cervical ICA without evidence of a significant stenosis or dissection. Vertebral arteries: Patent and codominant. Dense venous contrast completely obscures the right V2 segment in the C4-5 region. There is no evidence of a flow limiting stenosis or dissection elsewhere. Skeleton: Moderate multilevel cervical disc degeneration and left C2-3 facet arthrosis. Other neck: No evidence of cervical lymphadenopathy or mass, with streak artifact limiting assessment of the lower neck/thoracic inlet. Venous gas scattered throughout the neck and head, presumably iatrogenic. Upper  chest: Clear lung apices. Review of the MIP images confirms the above findings CTA HEAD FINDINGS Anterior circulation: The internal carotid arteries are patent from skull base to carotid termini with mild atherosclerotic calcification not resulting in significant stenosis. ACAs and MCAs are patent without evidence of a proximal branch occlusion or significant proximal stenosis. No aneurysm is identified. Posterior circulation: The intracranial vertebral arteries are widely patent to the basilar with mild atheromatous irregularity involving the right greater than left V4 segments. The right PICA and left AICA appear dominant. Patent SCA origins are visualized  bilaterally. The basilar artery is widely patent. There are large left and diminutive or absent right posterior communicating arteries with hypoplasia of the left P1 segment. Both PCAs are patent without evidence of a significant proximal stenosis. No aneurysm is identified. Venous sinuses: As permitted by contrast timing, patent. Anatomic variants: Fetal left PCA. Review of the MIP images confirms the above findings IMPRESSION: 1. No evidence of acute intracranial abnormality. 2. Mild atherosclerosis in the head and neck without a large vessel occlusion, significant stenosis, or dissection identified within above described limitations. Electronically Signed   By: Dasie Hamburg M.D.   On: 08/07/2024 16:27   CT Angio Chest/Abd/Pel for Dissection W and/or Wo Contrast Result Date: 08/07/2024 CLINICAL DATA:  Acute aortic syndrome suspected. EXAM: CT ANGIOGRAPHY CHEST, ABDOMEN AND PELVIS TECHNIQUE: Non-contrast CT of the chest was initially obtained. Multidetector CT imaging through the chest, abdomen and pelvis was performed using the standard protocol during bolus administration of intravenous contrast. Multiplanar reconstructed images and MIPs were obtained and reviewed to evaluate the vascular anatomy. RADIATION DOSE REDUCTION: This exam was performed according  to the departmental dose-optimization program which includes automated exposure control, adjustment of the mA and/or kV according to patient size and/or use of iterative reconstruction technique. CONTRAST:  OMNIPAQUE  IOHEXOL  350 MG/ML SOLN COMPARISON:  CT abdomen 02/25/2013 FINDINGS: CTA CHEST FINDINGS Cardiovascular: Preferential opacification of the thoracic aorta. No evidence of thoracic aortic aneurysm or dissection. Normal heart size. No pericardial effusion. Mediastinum/Nodes: There is a small hiatal hernia. The esophagus appears within normal limits. There are 2 hypodense left thyroid  nodules measuring up to 6 mm. There is a small amount of pneumomediastinum anteriorly. Enlarged subcarinal lymph nodes measure up to 17 mm short axis. Enlarged right hilar lymph nodes measure up to 11 mm short axis. Enlarged precarinal lymph node measures 14 mm. There also prominent, but nonenlarged paratracheal and AP window lymph nodes. Lungs/Pleura: There is a trace left pleural effusion. There are minimal ground-glass opacities in the inferior left lower lobe. The lungs are otherwise clear. There is no pneumothorax. Musculoskeletal: There is a small amount of air seen within the anterior chest wall, possibly vascular in origin. No acute fractures are seen. Review of the MIP images confirms the above findings. CTA ABDOMEN AND PELVIS FINDINGS VASCULAR Aorta: Normal caliber aorta without aneurysm, dissection, vasculitis or significant stenosis. There is moderate calcified atherosclerotic disease throughout the aorta. Celiac: Patent without evidence of aneurysm, dissection, vasculitis or significant stenosis. SMA: Patent without evidence of aneurysm, dissection, vasculitis or significant stenosis. Renals: Both renal arteries are patent without evidence of aneurysm, dissection, vasculitis, fibromuscular dysplasia or significant stenosis. IMA: Patent without evidence of aneurysm, dissection, vasculitis or significant  stenosis. Inflow: Patent without evidence of aneurysm, dissection, vasculitis or significant stenosis. Mild calcified atherosclerotic disease present. Veins: No obvious venous abnormality within the limitations of this arterial phase study. Review of the MIP images confirms the above findings. NON-VASCULAR Hepatobiliary: No focal liver abnormality is seen. No gallstones, gallbladder wall thickening, or biliary dilatation. Pancreas: Unremarkable. No pancreatic ductal dilatation or surrounding inflammatory changes. Spleen: Normal in size without focal abnormality. Adrenals/Urinary Tract: There is mild nonspecific thickening of the bilateral adrenal glands. The bilateral kidneys appear within normal limits. The bladder is within normal limits. Stomach/Bowel: Stomach is within normal limits. Appendix appears normal. No evidence of bowel wall thickening, distention, or inflammatory changes. There is a large amount of stool throughout the colon. There is sigmoid colon diverticulosis. Lymphatic: There are enlarged periportal lymph nodes  measuring up to 16 mm short axis. There are nonenlarged retroperitoneal, iliac chain and inguinal lymph nodes. Reproductive: Prostate is unremarkable. Other: There is a small fat containing right inguinal hernia. There is a small fat containing umbilical hernia. No free fluid. Musculoskeletal: No acute or significant osseous findings. Review of the MIP images confirms the above findings. IMPRESSION: 1. No evidence for aortic dissection or aneurysm. 2. Small amount of pneumomediastinum. 3. Trace left pleural effusion. 4. Minimal ground-glass opacities in the left lower lobe may be infectious/inflammatory. 5. Mediastinal and right hilar lymphadenopathy of uncertain etiology. 6. Periportal lymphadenopathy of uncertain etiology. 7. Sigmoid colon diverticulosis. 8. Subcentimeter incidental left thyroid  nodule. Not clinically significant; no follow-up imaging recommended (ref: J Am Coll Radiol.  2015 Feb;12(2): 143-50). Aortic Atherosclerosis (ICD10-I70.0). Electronically Signed   By: Greig Pique M.D.   On: 08/07/2024 16:24     Time coordinating discharge: Over 30 minutes    Alm Apo, MD  [facility] 08/14/2024, 12:24 PM

## 2024-08-14 NOTE — TOC Transition Note (Signed)
 Transition of Care [facility]) - Discharge Note   Patient Details  Name: Christian Borgerding. MRN: [id number] Date of Birth: May 23, 1961  Transition of Care [facility] CM/SW Contact:  Robynn Eileen Hoose, RN Phone Number: 08/14/2024, 10:23 AM   Clinical Narrative:   Patient is being discharged today. Amy with [facility] made aware.    Final next level of care: Home w Home Health Services Barriers to Discharge: No Barriers Identified   Patient Goals and CMS Choice            Discharge Placement                       Discharge Plan and Services Additional resources added to the After Visit Summary for     Discharge Planning Services: CM Consult Post Acute Care Choice: Home Health                    HH Arranged: OT, PT [facility] Agency: [facility] Date [facility] Agency Contacted: 08/11/24 Time HH Agency Contacted: 1952 Representative spoke with at [facility] Agency: Amy  Social Drivers of Health (SDOH) Interventions SDOH Screenings   Food Insecurity: No Food Insecurity (08/10/2024)  Housing: Low Risk  (08/10/2024)  Transportation Needs: No Transportation Needs (08/10/2024)  Utilities: Not At Risk (08/10/2024)  Depression (PHQ2-9): Low Risk  (03/31/2024)  Tobacco Use: High Risk (08/07/2024)     Readmission Risk Interventions    08/08/2024    3:35 PM  Readmission Risk Prevention Plan  Post Dischage Appt Complete  Medication Screening Complete  Transportation Screening Complete

## 2024-08-15 ENCOUNTER — Telehealth: Payer: Self-pay

## 2024-08-15 NOTE — Transitions of Care (Post Inpatient/ED Visit) (Signed)
   08/15/2024  Name: Jeffrey Black. MRN: [id number] DOB: 1960-12-10  Today's TOC FU Call Status: Today's TOC FU Call Status:: Unsuccessful Call (1st Attempt) Unsuccessful Call (1st Attempt) Date: 08/15/24  Attempted to reach the patient regarding the most recent Inpatient/ED visit.  Follow Up Plan: Additional outreach attempts will be made to reach the patient to complete the Transitions of Care (Post Inpatient/ED visit) call.   Alan Ee, RN, BSN, CEN [employer]- Transition of Care Team.  [facility] [phone]

## 2024-08-16 ENCOUNTER — Telehealth: Payer: Self-pay

## 2024-08-16 NOTE — Transitions of Care (Post Inpatient/ED Visit) (Signed)
   08/16/2024  Name: Jeffrey Black. MRN: [id number] DOB: Apr 23, 1961  Today's TOC FU Call Status: Today's TOC FU Call Status:: Unsuccessful Call (2nd Attempt) Unsuccessful Call (2nd Attempt) Date: 08/16/24  Attempted to reach the patient regarding the most recent Inpatient/ED visit.  Follow Up Plan: Additional outreach attempts will be made to reach the patient to complete the Transitions of Care (Post Inpatient/ED visit) call.   Alan Ee, RN, BSN, CEN [employer]- Transition of Care Team.  [facility] [phone]

## 2024-08-17 ENCOUNTER — Telehealth: Payer: Self-pay

## 2024-08-17 ENCOUNTER — Telehealth: Payer: Self-pay | Admitting: Cardiology

## 2024-08-17 NOTE — Transitions of Care (Post Inpatient/ED Visit) (Signed)
   08/17/2024  Name: Jeffrey Black. MRN: [id number] DOB: Feb 27, 1961  Today's TOC FU Call Status: Today's TOC FU Call Status:: Successful TOC FU Call Completed TOC FU Call Complete Date: 08/17/24 Patient's Name and Date of Birth confirmed.  Transition Care Management Follow-up Telephone Call Type of Discharge: Inpatient Admission How have you been since you were released from the hospital?: Better (just tired) Any questions or concerns?: No  Items Reviewed: Did you receive and understand the discharge instructions provided?: Yes Medications obtained,verified, and reconciled?: No Medications Not Reviewed Reasons:: Other: (reports that he has all his medications and knows how to take them. declines review at this time.) Any new allergies since your discharge?: No  Medications Reviewed Today:  Patient declined to review medications.  Medications Reviewed Today   Medications were not reviewed in this encounter      Placed call to patient who reports that he is doing well. Reports just being tired.  States that he does not want to go over instruction  and or medications. Reports that he has it under control.   Provided my contact information for patient to call me if he changes his mind.  Alan Ee, RN, BSN, CEN [employer]- Transition of Care Team.  [facility] [phone]

## 2024-08-17 NOTE — Telephone Encounter (Signed)
 Left message to call back.

## 2024-08-17 NOTE — Telephone Encounter (Signed)
 Niece is returning call. Please advise

## 2024-08-17 NOTE — Telephone Encounter (Signed)
 Spoke with niece. Because he is new pt we do not have DPR. She does have access to his MyChart and will send in a MyChart message.

## 2024-08-17 NOTE — Telephone Encounter (Signed)
 Niece Hennie) called to follow-up on orders for patient to have a repeat order.

## 2024-08-18 ENCOUNTER — Other Ambulatory Visit: Payer: Self-pay | Admitting: Internal Medicine

## 2024-08-18 ENCOUNTER — Other Ambulatory Visit: Payer: Self-pay

## 2024-08-18 ENCOUNTER — Telehealth (HOSPITAL_COMMUNITY): Payer: Self-pay

## 2024-08-18 DIAGNOSIS — Z79899 Other long term (current) drug therapy: Secondary | ICD-10-CM

## 2024-08-18 DIAGNOSIS — L409 Psoriasis, unspecified: Secondary | ICD-10-CM

## 2024-08-18 DIAGNOSIS — L4050 Arthropathic psoriasis, unspecified: Secondary | ICD-10-CM

## 2024-08-18 NOTE — Telephone Encounter (Signed)
 Phase II referral. Called patient regarding interest in cardiac rehab, patient confirmed interest but states he may have issues with transportation as he does not have a DL and would need someone to drive him.   Patient has f/u 10/07.  Faxed Medicaid form to Dr. Dorine office.

## 2024-08-18 NOTE — Telephone Encounter (Signed)
 Last Fill: 05/25/2024  Labs: 08/12/2024 WBC 13.6, RBC 3.63, Hgb 10.2, Hct 31.5, 08/14/2024 Calcium  8.2  TB Gold: 11/26/2023 Neg    Next Visit: 09/07/2024  Last Visit: 06/01/2024  IK:Ednmpjupr arthritis   Current Dose per office note 06/01/2024: Cosentyx  300 mg Laporte monthly.   Okay to refill Cosentyx ?

## 2024-08-19 ENCOUNTER — Telehealth: Payer: Self-pay

## 2024-08-19 ENCOUNTER — Other Ambulatory Visit (HOSPITAL_COMMUNITY): Payer: Self-pay

## 2024-08-19 ENCOUNTER — Other Ambulatory Visit: Payer: Self-pay

## 2024-08-19 DIAGNOSIS — I3139 Other pericardial effusion (noninflammatory): Secondary | ICD-10-CM

## 2024-08-19 MED ORDER — COSENTYX UNOREADY 300 MG/2ML ~~LOC~~ SOAJ
300.0000 mg | SUBCUTANEOUS | 0 refills | Status: DC
  Filled 2024-08-19: qty 2, 28d supply, fill #0

## 2024-08-19 NOTE — Telephone Encounter (Signed)
 Order for echo placed STAT for pericardial effusion, Dr. Shlomo requests echo to be done week of 08/22/24 to 08/26/24. MC to patient to advise that we will try to get echo scheduled for next week. Patient has appt with Hao Meng on 08/23/24.

## 2024-08-23 ENCOUNTER — Other Ambulatory Visit: Payer: Self-pay

## 2024-08-23 ENCOUNTER — Ambulatory Visit: Attending: Physician Assistant | Admitting: Emergency Medicine

## 2024-08-23 ENCOUNTER — Emergency Department (HOSPITAL_COMMUNITY)

## 2024-08-23 ENCOUNTER — Observation Stay (HOSPITAL_COMMUNITY)
Admission: EM | Admit: 2024-08-23 | Discharge: 2024-08-26 | DRG: 312 | Disposition: A | Discharge status: Home / Self Care | Attending: Family Medicine | Admitting: Family Medicine

## 2024-08-23 ENCOUNTER — Encounter: Payer: Self-pay | Admitting: Emergency Medicine

## 2024-08-23 VITALS — BP 68/52 | HR 79 | Ht 71.0 in | Wt 200.0 lb

## 2024-08-23 DIAGNOSIS — I480 Paroxysmal atrial fibrillation: Secondary | ICD-10-CM | POA: Insufficient documentation

## 2024-08-23 DIAGNOSIS — I9589 Other hypotension: Secondary | ICD-10-CM

## 2024-08-23 DIAGNOSIS — I2510 Atherosclerotic heart disease of native coronary artery without angina pectoris: Secondary | ICD-10-CM | POA: Diagnosis present

## 2024-08-23 DIAGNOSIS — W19XXXA Unspecified fall, initial encounter: Secondary | ICD-10-CM | POA: Diagnosis not present

## 2024-08-23 DIAGNOSIS — I409 Acute myocarditis, unspecified: Secondary | ICD-10-CM | POA: Diagnosis present

## 2024-08-23 DIAGNOSIS — N179 Acute kidney failure, unspecified: Secondary | ICD-10-CM | POA: Diagnosis present

## 2024-08-23 DIAGNOSIS — F32A Depression, unspecified: Secondary | ICD-10-CM | POA: Diagnosis present

## 2024-08-23 DIAGNOSIS — R42 Dizziness and giddiness: Secondary | ICD-10-CM | POA: Diagnosis not present

## 2024-08-23 DIAGNOSIS — R7989 Other specified abnormal findings of blood chemistry: Secondary | ICD-10-CM | POA: Diagnosis present

## 2024-08-23 DIAGNOSIS — Z79899 Other long term (current) drug therapy: Secondary | ICD-10-CM

## 2024-08-23 DIAGNOSIS — F1091 Alcohol use, unspecified, in remission: Secondary | ICD-10-CM | POA: Insufficient documentation

## 2024-08-23 DIAGNOSIS — Z7984 Long term (current) use of oral hypoglycemic drugs: Secondary | ICD-10-CM

## 2024-08-23 DIAGNOSIS — I509 Heart failure, unspecified: Secondary | ICD-10-CM | POA: Diagnosis not present

## 2024-08-23 DIAGNOSIS — R296 Repeated falls: Secondary | ICD-10-CM | POA: Insufficient documentation

## 2024-08-23 DIAGNOSIS — I3139 Other pericardial effusion (noninflammatory): Secondary | ICD-10-CM | POA: Insufficient documentation

## 2024-08-23 DIAGNOSIS — Z7901 Long term (current) use of anticoagulants: Secondary | ICD-10-CM | POA: Insufficient documentation

## 2024-08-23 DIAGNOSIS — R531 Weakness: Secondary | ICD-10-CM | POA: Diagnosis not present

## 2024-08-23 DIAGNOSIS — E785 Hyperlipidemia, unspecified: Secondary | ICD-10-CM | POA: Diagnosis present

## 2024-08-23 DIAGNOSIS — Z7902 Long term (current) use of antithrombotics/antiplatelets: Secondary | ICD-10-CM

## 2024-08-23 DIAGNOSIS — Z7982 Long term (current) use of aspirin: Secondary | ICD-10-CM

## 2024-08-23 DIAGNOSIS — I309 Acute pericarditis, unspecified: Secondary | ICD-10-CM | POA: Diagnosis present

## 2024-08-23 DIAGNOSIS — I959 Hypotension, unspecified: Secondary | ICD-10-CM | POA: Diagnosis not present

## 2024-08-23 DIAGNOSIS — Z955 Presence of coronary angioplasty implant and graft: Secondary | ICD-10-CM | POA: Insufficient documentation

## 2024-08-23 DIAGNOSIS — I10 Essential (primary) hypertension: Secondary | ICD-10-CM | POA: Diagnosis not present

## 2024-08-23 DIAGNOSIS — E1165 Type 2 diabetes mellitus with hyperglycemia: Secondary | ICD-10-CM | POA: Diagnosis not present

## 2024-08-23 DIAGNOSIS — B3322 Viral myocarditis: Secondary | ICD-10-CM | POA: Diagnosis not present

## 2024-08-23 DIAGNOSIS — R55 Syncope and collapse: Secondary | ICD-10-CM | POA: Diagnosis not present

## 2024-08-23 DIAGNOSIS — K449 Diaphragmatic hernia without obstruction or gangrene: Secondary | ICD-10-CM | POA: Diagnosis not present

## 2024-08-23 DIAGNOSIS — I358 Other nonrheumatic aortic valve disorders: Secondary | ICD-10-CM | POA: Insufficient documentation

## 2024-08-23 DIAGNOSIS — I517 Cardiomegaly: Secondary | ICD-10-CM | POA: Diagnosis not present

## 2024-08-23 DIAGNOSIS — R079 Chest pain, unspecified: Secondary | ICD-10-CM | POA: Diagnosis not present

## 2024-08-23 DIAGNOSIS — I952 Hypotension due to drugs: Principal | ICD-10-CM | POA: Diagnosis present

## 2024-08-23 DIAGNOSIS — I5032 Chronic diastolic (congestive) heart failure: Secondary | ICD-10-CM | POA: Diagnosis present

## 2024-08-23 DIAGNOSIS — I110 Hypertensive heart disease with heart failure: Secondary | ICD-10-CM | POA: Diagnosis present

## 2024-08-23 DIAGNOSIS — Z8249 Family history of ischemic heart disease and other diseases of the circulatory system: Secondary | ICD-10-CM

## 2024-08-23 DIAGNOSIS — I514 Myocarditis, unspecified: Secondary | ICD-10-CM | POA: Insufficient documentation

## 2024-08-23 DIAGNOSIS — J9811 Atelectasis: Secondary | ICD-10-CM | POA: Diagnosis not present

## 2024-08-23 DIAGNOSIS — L4050 Arthropathic psoriasis, unspecified: Secondary | ICD-10-CM | POA: Diagnosis present

## 2024-08-23 DIAGNOSIS — T464X5A Adverse effect of angiotensin-converting-enzyme inhibitors, initial encounter: Secondary | ICD-10-CM | POA: Diagnosis present

## 2024-08-23 DIAGNOSIS — E872 Acidosis, unspecified: Secondary | ICD-10-CM | POA: Diagnosis present

## 2024-08-23 DIAGNOSIS — F17210 Nicotine dependence, cigarettes, uncomplicated: Secondary | ICD-10-CM | POA: Diagnosis present

## 2024-08-23 DIAGNOSIS — F1011 Alcohol abuse, in remission: Secondary | ICD-10-CM | POA: Diagnosis present

## 2024-08-23 DIAGNOSIS — Z9861 Coronary angioplasty status: Secondary | ICD-10-CM

## 2024-08-23 LAB — COMPREHENSIVE METABOLIC PANEL WITH GFR
ALT: 19 U/L (ref 0–44)
AST: 17 U/L (ref 15–41)
Albumin: 3.5 g/dL (ref 3.5–5.0)
Alkaline Phosphatase: 53 U/L (ref 38–126)
Anion gap: 12 (ref 5–15)
BUN: 28 mg/dL — ABNORMAL HIGH (ref 8–23)
CO2: 25 mmol/L (ref 22–32)
Calcium: 9.3 mg/dL (ref 8.9–10.3)
Chloride: 96 mmol/L — ABNORMAL LOW (ref 98–111)
Creatinine, Ser: 1.66 mg/dL — ABNORMAL HIGH (ref 0.61–1.24)
GFR, Estimated: 46 mL/min — ABNORMAL LOW (ref >60)
Glucose, Bld: 80 mg/dL (ref 70–99)
Potassium: 4.4 mmol/L (ref 3.5–5.1)
Sodium: 133 mmol/L — ABNORMAL LOW (ref 135–145)
Total Bilirubin: 0.7 mg/dL (ref 0.0–1.2)
Total Protein: 6.7 g/dL (ref 6.5–8.1)

## 2024-08-23 LAB — I-STAT CHEM 8, ED
BUN: 29 mg/dL — ABNORMAL HIGH (ref 8–23)
Calcium, Ion: 1.2 mmol/L (ref 1.15–1.40)
Chloride: 98 mmol/L (ref 98–111)
Creatinine, Ser: 1.7 mg/dL — ABNORMAL HIGH (ref 0.61–1.24)
Glucose, Bld: 77 mg/dL (ref 70–99)
HCT: 40 % (ref 39.0–52.0)
Hemoglobin: 13.6 g/dL (ref 13.0–17.0)
Potassium: 4.4 mmol/L (ref 3.5–5.1)
Sodium: 134 mmol/L — ABNORMAL LOW (ref 135–145)
TCO2: 25 mmol/L (ref 22–32)

## 2024-08-23 LAB — RESP PANEL BY RT-PCR (RSV, FLU A&B, COVID)  RVPGX2
Influenza A by PCR: NEGATIVE
Influenza B by PCR: NEGATIVE
Resp Syncytial Virus by PCR: NEGATIVE
SARS Coronavirus 2 by RT PCR: NEGATIVE

## 2024-08-23 LAB — CBC WITH DIFFERENTIAL/PLATELET
Abs Immature Granulocytes: 0.04 K/uL (ref 0.00–0.07)
Basophils Absolute: 0.1 K/uL (ref 0.0–0.1)
Basophils Relative: 1 %
Eosinophils Absolute: 0.4 K/uL (ref 0.0–0.5)
Eosinophils Relative: 4 %
HCT: 40.4 % (ref 39.0–52.0)
Hemoglobin: 12.9 g/dL — ABNORMAL LOW (ref 13.0–17.0)
Immature Granulocytes: 1 %
Lymphocytes Relative: 29 %
Lymphs Abs: 2.5 K/uL (ref 0.7–4.0)
MCH: 28.4 pg (ref 26.0–34.0)
MCHC: 31.9 g/dL (ref 30.0–36.0)
MCV: 88.8 fL (ref 80.0–100.0)
Monocytes Absolute: 0.9 K/uL (ref 0.1–1.0)
Monocytes Relative: 11 %
Neutro Abs: 4.7 K/uL (ref 1.7–7.7)
Neutrophils Relative %: 54 %
Platelets: 300 K/uL (ref 150–400)
RBC: 4.55 MIL/uL (ref 4.22–5.81)
RDW: 13.8 % (ref 11.5–15.5)
WBC: 8.7 K/uL (ref 4.0–10.5)
nRBC: 0 % (ref 0.0–0.2)

## 2024-08-23 LAB — TROPONIN I (HIGH SENSITIVITY)
Troponin I (High Sensitivity): 15 ng/L (ref <18)
Troponin I (High Sensitivity): 14 ng/L (ref <18)

## 2024-08-23 LAB — I-STAT CG4 LACTIC ACID, ED
Lactic Acid, Venous: 2.5 mmol/L (ref 0.5–1.9)
Lactic Acid, Venous: 1.8 mmol/L (ref 0.5–1.9)

## 2024-08-23 LAB — PROTIME-INR
INR: 1.1 (ref 0.8–1.2)
Prothrombin Time: 14.8 s (ref 11.4–15.2)

## 2024-08-23 LAB — CBG MONITORING, ED: Glucose-Capillary: 85 mg/dL (ref 70–99)

## 2024-08-23 LAB — CK TOTAL AND CKMB (NOT AT ARMC)
CK, MB: 1.6 ng/mL (ref 0.5–5.0)
Total CK: 42 U/L — ABNORMAL LOW (ref 49–397)

## 2024-08-23 MED ORDER — ONDANSETRON HCL 4 MG PO TABS: 4.0000 mg | ORAL_TABLET | Freq: Four times a day (QID) | ORAL | Status: DC | PRN

## 2024-08-23 MED ORDER — INSULIN ASPART 100 UNIT/ML IJ SOLN: 0.0000 [IU] | Freq: Every day | INTRAMUSCULAR | Status: DC

## 2024-08-23 MED ORDER — IOHEXOL 350 MG/ML SOLN
65.0000 mL | Freq: Once | INTRAVENOUS | Status: AC | PRN
  Administered 2024-08-23: 65 mL via INTRAVENOUS

## 2024-08-23 MED ORDER — CLOPIDOGREL BISULFATE 75 MG PO TABS
75.0000 mg | ORAL_TABLET | Freq: Every day | ORAL | Status: DC
  Administered 2024-08-24 – 2024-08-26 (×3): 75 mg via ORAL
  Filled 2024-08-23 (×3): qty 1

## 2024-08-23 MED ORDER — APIXABAN 5 MG PO TABS
5.0000 mg | ORAL_TABLET | Freq: Two times a day (BID) | ORAL | Status: DC
  Administered 2024-08-23 – 2024-08-26 (×6): 5 mg via ORAL
  Filled 2024-08-23 (×6): qty 1

## 2024-08-23 MED ORDER — COLCHICINE 0.6 MG PO TABS
0.6000 mg | ORAL_TABLET | Freq: Two times a day (BID) | ORAL | Status: DC
  Administered 2024-08-23 – 2024-08-26 (×6): 0.6 mg via ORAL
  Filled 2024-08-23 (×7): qty 1

## 2024-08-23 MED ORDER — ONDANSETRON HCL 4 MG/2ML IJ SOLN: 4.0000 mg | Freq: Four times a day (QID) | INTRAMUSCULAR | Status: DC | PRN

## 2024-08-23 MED ORDER — METRONIDAZOLE 500 MG/100ML IV SOLN
500.0000 mg | Freq: Once | INTRAVENOUS | Status: AC
  Administered 2024-08-23: 500 mg via INTRAVENOUS
  Filled 2024-08-23: qty 100

## 2024-08-23 MED ORDER — INSULIN ASPART 100 UNIT/ML IJ SOLN
0.0000 [IU] | Freq: Three times a day (TID) | INTRAMUSCULAR | Status: DC
  Administered 2024-08-24: 3 [IU] via SUBCUTANEOUS
  Administered 2024-08-26: 2 [IU] via SUBCUTANEOUS

## 2024-08-23 MED ORDER — SODIUM CHLORIDE 0.9 % IV SOLN: INTRAVENOUS | Status: AC

## 2024-08-23 MED ORDER — METOPROLOL SUCCINATE ER 25 MG PO TB24
25.0000 mg | ORAL_TABLET | Freq: Every day | ORAL | Status: DC
  Administered 2024-08-24: 25 mg via ORAL
  Filled 2024-08-23: qty 1

## 2024-08-23 MED ORDER — ROSUVASTATIN CALCIUM 20 MG PO TABS
20.0000 mg | ORAL_TABLET | Freq: Every day | ORAL | Status: DC
Start: 2024-08-24 — End: 2024-08-26
  Administered 2024-08-24 – 2024-08-26 (×3): 20 mg via ORAL
  Filled 2024-08-23 (×3): qty 1

## 2024-08-23 MED ORDER — ASPIRIN 81 MG PO TBEC
81.0000 mg | DELAYED_RELEASE_TABLET | Freq: Every day | ORAL | Status: DC
  Administered 2024-08-24: 81 mg via ORAL
  Filled 2024-08-23: qty 1

## 2024-08-23 MED ORDER — SODIUM CHLORIDE 0.9 % IV SOLN
2.0000 g | Freq: Once | INTRAVENOUS | Status: AC
  Administered 2024-08-23: 2 g via INTRAVENOUS
  Filled 2024-08-23: qty 12.5

## 2024-08-23 MED ORDER — LACTATED RINGERS IV SOLN: INTRAVENOUS | Status: AC

## 2024-08-23 MED ORDER — VANCOMYCIN HCL IN DEXTROSE 1-5 GM/200ML-% IV SOLN
1000.0000 mg | Freq: Once | INTRAVENOUS | Status: AC
  Administered 2024-08-23: 1000 mg via INTRAVENOUS
  Filled 2024-08-23: qty 200

## 2024-08-23 NOTE — Patient Instructions (Signed)
 YOU HAVE BEEN RECOMMENDED TO GO TO EMERGENCY DEPARTMENT BY AMBULANCE

## 2024-08-23 NOTE — ED Triage Notes (Signed)
 [facility] arriving with pt from [facility] across the street. The patient was referred to the ER due to hypotension, paleness, diaphoretic, short period of chest pain. The patient recently had been admitted on 9/21 for pneumomediastinum, shock, afib rvr, pericardial effusion, CAD, myopcarditis, CHF. Patient is AxOx4, ambulatory. Dr Patt at bedside.   EMS vitals:  96/52 BP 62 HR O2 100 on RA RR 16 CBG 95     20 LAC 500 Fluid NS NSR

## 2024-08-23 NOTE — H&P (Signed)
 History and Physical    Patient: Jeffrey Black. FMW:[id number] DOB: 1961/02/21 DOA: 08/23/2024 DOS: the patient was seen and examined on 08/23/2024 PCP: [name], MD  Patient coming from: Home  Chief Complaint:  Chief Complaint  Patient presents with   Hypotension   Dizziness   HPI: Jeffrey Black. is a 63 y.o. male with medical history significant of diabetes, alcohol  abuse, history of bronchitis, essential hypertension, psoriatic arthritis, history of polysubstance abuse previously, depression with anxiety, who was recently admitted with chest pain and syncope on September 21.  Patient had pneumomediastinum and shock.  He was in the ICU.  He developed paroxysmal atrial fibrillation with pericardial effusions.  Patient had coronary artery disease and had PCI done developed myocarditis as well as has history of diastolic congestive heart failure.  Patient was hypotensive during recent hospitalization and was on Levophed  in the ICU.  At that time he ruled in with troponin as high as 1350.  Cardiology was consulted and patient was deemed to have noncardiac etiology of his rising troponin.  He had a left heart cath that showed distal RCA lesion up to 65%.  She has PCI performed and discharged on DAPT.  Patient went to his cardiology appointment today but was found to be hypotensive.  This was similar to his previous presentation.  His initial blood pressure was 70/40.  He was diaphoretic pale and near syncopal.  He has been progressively feeling bad since last week when he left the hospital.  He has been weak.  Patient sent to the ER for further evaluation and treatment.  In the ER him continues to have low blood pressure despite fluids resuscitation.  Systolic has been in the 90s now.  He is pericardial effusion was noted after and showed bedside echo.  No significant change from previous.  No evidence of bleed.  He is fully awake alert and communicative.  Cardiology consulted and we  admitted the patient to the hospital for evaluation of persistent hypotension.  Review of Systems: As mentioned in the history of present illness. All other systems reviewed and are negative. Past Medical History:  Diagnosis Date   Depression    Diabetes (HCC)    ETOH abuse    History of bronchitis    Hypertension    Psoriasis    Substance abuse (HCC)    Past Surgical History:  Procedure Laterality Date   CORONARY STENT INTERVENTION N/A 08/10/2024   Procedure: CORONARY STENT INTERVENTION;  Surgeon: Anner Alm ORN, MD;  Location: [facility] INVASIVE CV LAB;  Service: Cardiovascular;  Laterality: N/A;   ESOPHAGOGASTRODUODENOSCOPY N/A 05/03/2013   Procedure: ESOPHAGOGASTRODUODENOSCOPY (EGD);  Surgeon: [name], MD;  Location: [facility];  Service: Endoscopy;  Laterality: N/A;   left 4th finger surgery     LEFT HEART CATH AND CORONARY ANGIOGRAPHY N/A 08/10/2024   Procedure: LEFT HEART CATH AND CORONARY ANGIOGRAPHY;  Surgeon: Anner Alm ORN, MD;  Location: [facility] INVASIVE CV LAB;  Service: Cardiovascular;  Laterality: N/A;   Social History:  reports that he has been smoking cigarettes. He has a 34 pack-year smoking history. He has been exposed to tobacco smoke. He has never used smokeless tobacco. He reports that he does not currently use alcohol . He reports that he does not use drugs.  No Known Allergies  Family History  Problem Relation Age of Onset   Colon polyps Mother    Diabetes Mother    Diabetes type II Mother    Kidney disease  Father    Hypertension Father    Hyperlipidemia Father    Heart disease Father    Stroke Father    CVA Father    CVA Maternal Grandmother    Hypertension Maternal Grandmother    Hyperlipidemia Maternal Grandmother    Heart disease Maternal Grandmother    Diabetes Maternal Grandmother    Hypertension Maternal Grandfather    Hyperlipidemia Maternal Grandfather    Heart disease Maternal Grandfather    Diabetes Maternal Grandfather    Hyperlipidemia  Paternal Grandmother    Heart disease Paternal Grandmother    Hyperlipidemia Paternal Grandfather    Heart disease Paternal Grandfather    Diabetes Paternal Grandfather    Depression Paternal Grandfather    Cancer - Colon Other    Colon cancer Neg Hx    Esophageal cancer Neg Hx    Rectal cancer Neg Hx    Stomach cancer Neg Hx     Prior to Admission medications   Medication Sig Start Date End Date Taking? Authorizing Provider  Accu-Chek Softclix Lancets lancets  08/28/23   [provider]  apixaban  (ELIQUIS ) 5 MG TABS tablet Take 1 tablet (5 mg total) by mouth 2 (two) times daily. 08/14/24   Patsy Lenis, MD  aspirin  EC 81 MG tablet Take 1 tablet (81 mg total) by mouth daily for 25 days. Swallow whole. 08/15/24 09/09/24  Patsy Lenis, MD  Blood Glucose Monitoring Suppl DEVI 1 each by Does not apply route in the morning, at noon, and at bedtime. May substitute to any manufacturer covered by patient's insurance. 02/06/23   [name], MD  clopidogrel  (PLAVIX ) 75 MG tablet Take 1 tablet (75 mg total) by mouth daily with breakfast. 08/15/24   Patsy Lenis, MD  colchicine  0.6 MG tablet Take 1 tablet (0.6 mg total) by mouth 2 (two) times daily. 08/14/24 11/12/24  Patsy Lenis, MD  empagliflozin  (JARDIANCE ) 25 MG TABS tablet Take 1 tablet (25 mg total) by mouth daily before breakfast. 03/31/24   [name], MD  furosemide  (LASIX ) 40 MG tablet Take 1 tablet (40 mg total) by mouth 2 (two) times daily. 08/14/24   Patsy Lenis, MD  Glucose Blood (BLOOD GLUCOSE TEST STRIPS) STRP 1 each by In Vitro route daily. May substitute to any manufacturer covered by patient's insurance. 02/06/23   [name], MD  losartan  (COZAAR ) 25 MG tablet Take 1 tablet (25 mg total) by mouth daily. 08/15/24   Patsy Lenis, MD  metFORMIN  (GLUCOPHAGE ) 500 MG tablet Take 2 tablets (1,000 mg total) by mouth 2 (two) times daily with a meal. 04/22/24   [name], MD  metoprolol  succinate  (TOPROL -XL) 25 MG 24 hr tablet Take 1 tablet (25 mg total) by mouth daily. 08/15/24   Patsy Lenis, MD  olmesartan  (BENICAR ) 5 MG tablet Take 1 tablet (5 mg total) by mouth daily. 03/31/24   [name], MD  rosuvastatin  (CRESTOR ) 20 MG tablet Take 1 tablet (20 mg total) by mouth daily. 08/15/24   Patsy Lenis, MD  Secukinumab  (COSENTYX  UNOREADY) 300 MG/2ML SOAJ Inject 300 mg into the skin every 28 (twenty-eight) days. 08/19/24   Jeannetta Lonni ORN, MD    Physical Exam: Vitals:   08/23/24 1949 08/23/24 1950 08/23/24 2100 08/23/24 2213  BP:   (!) 98/59   Pulse:   78   Resp:      Temp:    98.9 F (37.2 C)  TempSrc:    Oral  SpO2: 100%  100%  Weight:  90.7 kg    Height:  5' 11 (1.803 m)     Constitutional: Acutely ill looking NAD, calm, comfortable Eyes: PERRL, lids and conjunctivae normal ENMT: Mucous membranes are moist. Posterior pharynx clear of any exudate or lesions.Normal dentition.  Neck: normal, supple, no masses, no thyromegaly Respiratory: clear to auscultation bilaterally, no wheezing, no crackles. Normal respiratory effort. No accessory muscle use.  Cardiovascular: Regular rate and rhythm, no murmurs / rubs / gallops. No extremity edema. 2+ pedal pulses. No carotid bruits.  Abdomen: no tenderness, no masses palpated. No hepatosplenomegaly. Bowel sounds positive.  Musculoskeletal: Good range of motion, no joint swelling or tenderness, Skin: no rashes, lesions, ulcers. No induration Neurologic: CN 2-12 grossly intact. Sensation intact, DTR normal. Strength 5/5 in all 4.  Psychiatric: Normal judgment and insight. Alert and oriented x 3.  Depressed mood  Data Reviewed:  Blood pressure 68/52, pulse 79 respiratory 22.  Sodium 134 BUN 29 creatinine 1.70.  Lactic acid 2.5.  Hemoglobin 13.6.  Acute viral screen is negative for flu COVID and influenza.  CT angiogram of the chest shows no evidence of significant PE, no evidence of aortic aneurysm or dissection, cardiac  enlargement with moderate pericardial effusion which is increasing since the prior study.  Small esophageal hiatal hernia.  Chest x-ray showed no active disease.  Assessment and Plan:  #1 persistent hypotension: Patient does not appear to be having any sepsis or septic shock.  Empirically started on antibiotics.  Hypotension could be reactive.  He had similar finding previously when he has septic shock.  He was discharged not on any antibiotics the last time.  Cultures have been obtained and will monitor closely.  Cortisol level will be checked.  Secondary adrenal insufficiency is a possibility.  #2 pericardial effusion: Moderate at this point.  Defer to cardiology.  #3 type 2 diabetes: Initiate sliding scale insulin .  #4 recent myocarditis: Recovering.  Continue to monitor  #5 AKI: Most likely prerenal.  Hydrate and monitor  #6 depression: Continue antidepressants.    Advance Care Planning:   Code Status: Full Code   Consults: Cardiology  Family Communication: No family at bedside  Severity of Illness: The appropriate patient status for this patient is INPATIENT. Inpatient status is judged to be reasonable and necessary in order to provide the required intensity of service to ensure the patient's safety. The patient's presenting symptoms, physical exam findings, and initial radiographic and laboratory data in the context of their chronic comorbidities is felt to place them at high risk for further clinical deterioration. Furthermore, it is not anticipated that the patient will be medically stable for discharge from the hospital within 2 midnights of admission.   * I certify that at the point of admission it is my clinical judgment that the patient will require inpatient hospital care spanning beyond 2 midnights from the point of admission due to high intensity of service, high risk for further deterioration and high frequency of surveillance required.*  AuthorBETHA SIM KNOLL,  MD 08/23/2024 10:24 PM  For on call review [email or web address].

## 2024-08-23 NOTE — ED Notes (Addendum)
 Assuming pt care, pt bib ems coming from cardiologist followup appt. for onset hypotension, diaphoresis, and chest pain, pt reports having stent procedure 10 days ago. Pt aaox4, mae, skin warm/dry. Denies pain/complaints. Unable to provide urine sample at this time, urinal at bedside. Call bell within reach.

## 2024-08-23 NOTE — ED Provider Notes (Signed)
 [place] EMERGENCY DEPARTMENT AT [facility] Provider Note   CSN: [id number] Arrival date & time: 08/23/24  1718     Patient presents with: Hypotension and Dizziness   Jeffrey Adami. is a 63 y.o. male hx of afib on eliquis , here presenting with hypotension.  Patient was recently admitted and had pneumomediastinum and also pericardial effusion and myocarditis.  Patient was initially on antibiotics but blood culture and urine culture was negative.  Patient was discharged home about a week ago.  Since then he has been feeling very fatigued.  He states that he has lack of energy.  Patient went to his doctor for hospital follow-up and was noted to be hypotensive with blood pressure in the 70s.  Patient states that at home his blood pressure is running in the 80s.  Patient was sent here for further evaluation.  Patient denies any fevers at home.   The history is provided by the patient.       Prior to Admission medications   Medication Sig Start Date End Date Taking? Authorizing Provider  Accu-Chek Softclix Lancets lancets  08/28/23   [provider]  apixaban  (ELIQUIS ) 5 MG TABS tablet Take 1 tablet (5 mg total) by mouth 2 (two) times daily. 08/14/24   Patsy Lenis, MD  aspirin  EC 81 MG tablet Take 1 tablet (81 mg total) by mouth daily for 25 days. Swallow whole. 08/15/24 09/09/24  Patsy Lenis, MD  Blood Glucose Monitoring Suppl DEVI 1 each by Does not apply route in the morning, at noon, and at bedtime. May substitute to any manufacturer covered by patient's insurance. 02/06/23   [name], MD  clopidogrel  (PLAVIX ) 75 MG tablet Take 1 tablet (75 mg total) by mouth daily with breakfast. 08/15/24   Patsy Lenis, MD  colchicine  0.6 MG tablet Take 1 tablet (0.6 mg total) by mouth 2 (two) times daily. 08/14/24 11/12/24  Patsy Lenis, MD  empagliflozin  (JARDIANCE ) 25 MG TABS tablet Take 1 tablet (25 mg total) by mouth daily before breakfast. 03/31/24   [name], MD  furosemide  (LASIX ) 40 MG tablet Take 1 tablet (40 mg total) by mouth 2 (two) times daily. 08/14/24   Patsy Lenis, MD  Glucose Blood (BLOOD GLUCOSE TEST STRIPS) STRP 1 each by In Vitro route daily. May substitute to any manufacturer covered by patient's insurance. 02/06/23   [name], MD  losartan  (COZAAR ) 25 MG tablet Take 1 tablet (25 mg total) by mouth daily. 08/15/24   Patsy Lenis, MD  metFORMIN  (GLUCOPHAGE ) 500 MG tablet Take 2 tablets (1,000 mg total) by mouth 2 (two) times daily with a meal. 04/22/24   [name], MD  metoprolol  succinate (TOPROL -XL) 25 MG 24 hr tablet Take 1 tablet (25 mg total) by mouth daily. 08/15/24   Patsy Lenis, MD  olmesartan  (BENICAR ) 5 MG tablet Take 1 tablet (5 mg total) by mouth daily. 03/31/24   [name], MD  rosuvastatin  (CRESTOR ) 20 MG tablet Take 1 tablet (20 mg total) by mouth daily. 08/15/24   Patsy Lenis, MD  Secukinumab  (COSENTYX  UNOREADY) 300 MG/2ML SOAJ Inject 300 mg into the skin every 28 (twenty-eight) days. 08/19/24   Rice, Lonni ORN, MD    Allergies: Patient has no known allergies.    Review of Systems  Constitutional:  Positive for fatigue.  Neurological:  Positive for dizziness.  All other systems reviewed and are negative.   Updated Vital Signs BP 101/69   Pulse 65  Temp 98.6 F (37 C) (Oral)   Resp 15   Ht 5' 11 (1.803 m)   Wt 90.7 kg   SpO2 100%   BMI 27.89 kg/m   Physical Exam Vitals and nursing note reviewed.  Constitutional:      Comments: Chronically ill-appearing  HENT:     Head: Normocephalic.     Nose: Nose normal.     Mouth/Throat:     Mouth: Mucous membranes are moist.  Eyes:     Extraocular Movements: Extraocular movements intact.     Pupils: Pupils are equal, round, and reactive to light.  Cardiovascular:     Rate and Rhythm: Regular rhythm. Tachycardia present.     Pulses: Normal pulses.  Pulmonary:     Comments: Crackles bilateral bases Abdominal:      General: Abdomen is flat.     Palpations: Abdomen is soft.  Musculoskeletal:        General: Normal range of motion.     Cervical back: Normal range of motion and neck supple.  Skin:    General: Skin is warm.     Capillary Refill: Capillary refill takes less than 2 seconds.  Neurological:     General: No focal deficit present.     Mental Status: He is alert and oriented to person, place, and time.  Psychiatric:        Mood and Affect: Mood normal.        Behavior: Behavior normal.     (all labs ordered are listed, but only abnormal results are displayed) Labs Reviewed  COMPREHENSIVE METABOLIC PANEL WITH GFR - Abnormal; Notable for the following components:      Result Value   Sodium 133 (*)    Chloride 96 (*)    BUN 28 (*)    Creatinine, Ser 1.66 (*)    GFR, Estimated 46 (*)    All other components within normal limits  CBC WITH DIFFERENTIAL/PLATELET - Abnormal; Notable for the following components:   Hemoglobin 12.9 (*)    All other components within normal limits  CK TOTAL AND CKMB (NOT AT [facility]) - Abnormal; Notable for the following components:   Total CK 42 (*)    All other components within normal limits  I-STAT CG4 LACTIC ACID, ED - Abnormal; Notable for the following components:   Lactic Acid, Venous 2.5 (*)    All other components within normal limits  I-STAT CHEM 8, ED - Abnormal; Notable for the following components:   Sodium 134 (*)    BUN 29 (*)    Creatinine, Ser 1.70 (*)    All other components within normal limits  RESP PANEL BY RT-PCR (RSV, FLU A&B, COVID)  RVPGX2  CULTURE, BLOOD (ROUTINE X 2)  CULTURE, BLOOD (ROUTINE X 2)  PROTIME-INR  URINALYSIS, W/ REFLEX TO CULTURE (INFECTION SUSPECTED)  I-STAT CG4 LACTIC ACID, ED  TROPONIN I (HIGH SENSITIVITY)  TROPONIN I (HIGH SENSITIVITY)    EKG: None  Radiology: CT Angio Chest PE W and/or Wo Contrast Result Date: 08/23/2024 CLINICAL DATA:  Pulmonary embolism suspected with high probability. EXAM: CT  ANGIOGRAPHY CHEST WITH CONTRAST TECHNIQUE: Multidetector CT imaging of the chest was performed using the standard protocol during bolus administration of intravenous contrast. Multiplanar CT image reconstructions and MIPs were obtained to evaluate the vascular anatomy. RADIATION DOSE REDUCTION: This exam was performed according to the departmental dose-optimization program which includes automated exposure control, adjustment of the mA and/or kV according to patient size and/or use of iterative reconstruction technique.  CONTRAST:  65mL OMNIPAQUE  IOHEXOL  350 MG/ML SOLN COMPARISON:  Chest radiograph 08/23/2024. CT chest abdomen and pelvis 08/07/2024. Cardiac MRI 08/09/2024 the FINDINGS: Cardiovascular: Technically adequate study with good opacification of the central and segmental pulmonary arteries. No focal filling defects. No evidence of significant pulmonary embolus. Mild cardiac enlargement. Moderate pericardial effusion increasing since prior study. Normal caliber thoracic aorta. Great vessel origins are patent. No aortic dissection. Mediastinum/Nodes: Small esophageal hiatal hernia. Esophagus is decompressed. No significant lymphadenopathy. Thyroid  gland is unremarkable. Lungs/Pleura: Mild dependent atelectasis in the lung bases. No airspace disease or consolidation. No pleural effusion or pneumothorax. Upper Abdomen: No acute abnormality. Musculoskeletal: No chest wall abnormality. No acute or significant osseous findings. Review of the MIP images confirms the above findings. IMPRESSION: 1. No evidence of significant pulmonary embolus. 2. No evidence of aortic aneurysm or dissection. 3. Cardiac enlargement. Moderate pericardial effusion is increasing since the prior study. 4. No evidence of active pulmonary disease. 5. Small esophageal hiatal hernia. Electronically Signed   By: Elsie Gravely M.D.   On: 08/23/2024 18:55   DG Chest Port 1 View Result Date: 08/23/2024 CLINICAL DATA:  Chest pain. EXAM:  PORTABLE CHEST 1 VIEW COMPARISON:  August 11, 2024. FINDINGS: The heart size and mediastinal contours are within normal limits. Both lungs are clear. The visualized skeletal structures are unremarkable. IMPRESSION: No active disease. Electronically Signed   By: Lynwood Landy Raddle M.D.   On: 08/23/2024 18:14     Procedures     EMERGENCY DEPARTMENT US  CARDIAC EXAM Study: Limited Ultrasound of the Heart and Pericardium  INDICATIONS:Abnormal vital signs Multiple views of the heart and pericardium were obtained in real-time with a multi-frequency probe.  PERFORMED AB:Fbdzoq IMAGES ARCHIVED?: Yes LIMITATIONS:  Body habitus VIEWS USED: Subcostal 4 chamber, Parasternal long axis, Parasternal short axis, and Apical 4 chamber  INTERPRETATION: Amount of pericardial effusion moderate, no obvious tamponade   CRITICAL CARE Performed by: Alm VEAR Cave   Total critical care time: 35 minutes  Critical care time was exclusive of separately billable procedures and treating other patients.  Critical care was necessary to treat or prevent imminent or life-threatening deterioration.  Critical care was time spent personally by me on the following activities: development of treatment plan with patient and/or surrogate as well as nursing, discussions with consultants, evaluation of patient's response to treatment, examination of patient, obtaining history from patient or surrogate, ordering and performing treatments and interventions, ordering and review of laboratory studies, ordering and review of radiographic studies, pulse oximetry and re-evaluation of patient's condition.   Medications Ordered in the ED  lactated ringers  infusion ( Intravenous New Bag/Given 08/23/24 1740)  vancomycin  (VANCOCIN ) IVPB 1000 mg/200 mL premix (has no administration in time range)  metroNIDAZOLE (FLAGYL) IVPB 500 mg (500 mg Intravenous New Bag/Given 08/23/24 1949)  ceFEPIme (MAXIPIME) 2 g in sodium chloride  0.9 % 100 mL IVPB  (0 g Intravenous Stopped 08/23/24 1904)  iohexol  (OMNIPAQUE ) 350 MG/ML injection 65 mL (65 mLs Intravenous Contrast Given 08/23/24 1813)                                    Medical Decision Making Jeffrey LITTIE Cristela Raddle. is a 63 y.o. male here presenting with hypotension.  Etiology of hypotension is unclear.  Patient can be septic versus hypovolemia versus tamponade from recent pericarditis.  Plan to get CBC and CMP and lactate and cultures and CK and CK-MB and  CTA chest.  Will give 30 cc/kg bolus and broad-spectrum antibiotics  7:59 PM I reviewed patient's labs and white blood cell is normal but lactate is elevated at 2.5.  Chest x-ray is unremarkable and CTA did not show any obvious pneumonia.  However patient does have worsening pericardial effusion.  I did a bedside echo and patient has no obvious tamponade but does have moderate effusion.  His troponin is also normal.  I have low suspicion of cardiogenic shock but I wonder if this is myocarditis or bacteremia or hypotension from hypovolemia.  I consulted cardiology to see patient.  At this point medicine service to admit  Problems Addressed: AKI (acute kidney injury): acute illness or injury Hypotension, unspecified hypotension type: acute illness or injury Pericardial effusion: acute illness or injury  Amount and/or Complexity of Data Reviewed Labs: ordered. Decision-making details documented in ED Course. Radiology: ordered and independent interpretation performed. Decision-making details documented in ED Course. ECG/medicine tests: ordered and independent interpretation performed. Decision-making details documented in ED Course.  Risk Prescription drug management. Decision regarding hospitalization.     Final diagnoses:  Hypotension, unspecified hypotension type  Pericardial effusion  AKI (acute kidney injury)    ED Discharge Orders     None          Jeffrey Alm Macho, MD 08/23/24 2002

## 2024-08-23 NOTE — Progress Notes (Cosign Needed Addendum)
 Cardiology Office Note:    Date:  08/23/2024   ID:  Jeffrey Black., DOB 03-Dec-1960, MRN [id number]  PCP:  Jeffrey Heron HERO, MD   [facility] Providers Cardiologist:  Wilbert Bihari, MD     Referring MD: Jeffrey Heron HERO, MD   Chief complaint: Hospital follow-up  History of Present Illness:    Jeffrey Black. is a 63 y.o. male with a hx of psoriatic arthritis, T2 DM, alcohol  abuse presenting to office today following a hospital admission secondary to pneumomediastinum and shock with subsequent paroxysmal atrial fibrillation, pericardial effusion, CAD w/ PCI, myocarditis, congestive heart failure.  Presenting to the ED on 08/07/2024 complaining of chest pain and syncope, was acutely ill on arrival systolic BP in the 60s.  CT of head, neck, chest, abdomen pelvis with pneumomediastinum, with ground glass opacity in LLL. BP improved on Levophed .  Troponin 1350>>1336.  WBC 17.7, CR 1.21, BNP 190, lactate 2.4, EKG sinus tachycardia with nonspecific ST changes.  Cardiology was consulted who believed the shock was from an infectious etiology rather than cardiogenic.  TTE on 08/08/2024 showed LVEF 55-60%, no RWMA, normal LV function, moderate concentric LVH, G1 DD.  RV SF mildly reduced.  AV sclerosis present without evidence of stenosis.  Borderline dilatation of aortic root measuring 38 mm.  Right atrial pressure 15 mmHg.  Small-moderate circumferential pericardial effusion without tamponade.  New onset A-fib with RVR on 08/09/2024 started on amiodarone . HS troponin 2114>>1143.  Limited echo showed no change in pericardial effusion from prior study.  Subsequent cardiac MRI showed small infarct in apical inferior wall, myocardial edema, moderate pericardial effusion measuring up to 1.5 cm adjacent to LV inferior wall, dilated ascending aorta measuring 40 mm.   LHC 08/10/2024 revealed a distal RCA lesion 65% stenosed resulting in a 3.5 X20 Synergy DES placement with residual percent  stenosis and TIMI-3 flow restored.  Mid LAD lesion 20% stenosed with 20% stenosis in D2.  LVEDP severely dilated at 30 mmHg, no AV stenosis.  DAPT was recommended with aspirin  and Plavix  for 1 month, then Plavix  daily for 6 months d/t requiring DOAC 2/2 atrial fibrillation.  Patient presents to clinic today with his sister, appears acutely ill.  BP initially unable to be heard, finally observed at 70/40, patient diaphoretic, pale, near syncopal.  Symptoms improved on laying patient down, color did not return to patient's face.  Reports he has been feeling bad since being discharged from the hospital a week ago, has had difficulty getting off the couch.  Reports feeling so weak that he fell 3 times last night, denies fully losing consciousness, denies hitting head, sustained bruising to bilateral knees.  Reports when he left the hospital he was not feeling short of breath, his shortness of breath has returned over the last week and continued to worsen.  Worse with exertion.  Initially denied chest pain during the visit.  Towards the end of the visit, patient did complain of a fleeting substernal chest pain lasting only seconds, resolved spontaneously, described as sharp.  Denied excessive bleeding, bruising, dark/tarry/bloody stools, hematuria, palpitations, edema.   Past Medical History:  Diagnosis Date   Depression    Diabetes (HCC)    ETOH abuse    History of bronchitis    Hypertension    Psoriasis    Substance abuse (HCC)     Past Surgical History:  Procedure Laterality Date   CORONARY STENT INTERVENTION N/A 08/10/2024   Procedure: CORONARY STENT INTERVENTION;  Surgeon: Anner,  Alm ORN, MD;  Location: [facility] INVASIVE CV [facility];  Service: Cardiovascular;  Laterality: N/A;   ESOPHAGOGASTRODUODENOSCOPY N/A 05/03/2013   Procedure: ESOPHAGOGASTRODUODENOSCOPY (EGD);  Surgeon: [name], MD;  Location: [facility];  Service: Endoscopy;  Laterality: N/A;   left 4th finger surgery     LEFT HEART CATH  AND CORONARY ANGIOGRAPHY N/A 08/10/2024   Procedure: LEFT HEART CATH AND CORONARY ANGIOGRAPHY;  Surgeon: Anner Alm ORN, MD;  Location: [facility] INVASIVE CV LAB;  Service: Cardiovascular;  Laterality: N/A;    Current Medications: Current Meds  Medication Sig   Accu-Chek Softclix Lancets lancets    apixaban  (ELIQUIS ) 5 MG TABS tablet Take 1 tablet (5 mg total) by mouth 2 (two) times daily.   aspirin  EC 81 MG tablet Take 1 tablet (81 mg total) by mouth daily for 25 days. Swallow whole.   Blood Glucose Monitoring Suppl DEVI 1 each by Does not apply route in the morning, at noon, and at bedtime. May substitute to any manufacturer covered by patient's insurance.   clopidogrel  (PLAVIX ) 75 MG tablet Take 1 tablet (75 mg total) by mouth daily with breakfast.   colchicine  0.6 MG tablet Take 1 tablet (0.6 mg total) by mouth 2 (two) times daily.   empagliflozin  (JARDIANCE ) 25 MG TABS tablet Take 1 tablet (25 mg total) by mouth daily before breakfast.   furosemide  (LASIX ) 40 MG tablet Take 1 tablet (40 mg total) by mouth 2 (two) times daily.   Glucose Blood (BLOOD GLUCOSE TEST STRIPS) STRP 1 each by In Vitro route daily. May substitute to any manufacturer covered by patient's insurance.   losartan  (COZAAR ) 25 MG tablet Take 1 tablet (25 mg total) by mouth daily.   metFORMIN  (GLUCOPHAGE ) 500 MG tablet Take 2 tablets (1,000 mg total) by mouth 2 (two) times daily with a meal.   metoprolol  succinate (TOPROL -XL) 25 MG 24 hr tablet Take 1 tablet (25 mg total) by mouth daily.   olmesartan  (BENICAR ) 5 MG tablet Take 1 tablet (5 mg total) by mouth daily.   rosuvastatin  (CRESTOR ) 20 MG tablet Take 1 tablet (20 mg total) by mouth daily.   Secukinumab  (COSENTYX  UNOREADY) 300 MG/2ML SOAJ Inject 300 mg into the skin every 28 (twenty-eight) days.     Allergies:   Patient has no known allergies.   Social History   Socioeconomic History   Marital status: Divorced    Spouse name: Not on file   Number of children: Not on  file   Years of education: Not on file   Highest education level: Not on file  Occupational History   Not on file  Tobacco Use   Smoking status: Every Day    Current packs/day: 1.00    Average packs/day: 1 pack/day for 34.0 years (34.0 ttl pk-yrs)    Types: Cigarettes    Passive exposure: Past   Smokeless tobacco: Never  Vaping Use   Vaping status: Never Used  Substance and Sexual Activity   Alcohol  use: Not Currently   Drug use: No   Sexual activity: Not Currently  Other Topics Concern   Not on file  Social History Narrative   Not on file   Social Drivers of Health   Financial Resource Strain: Not on file  Food Insecurity: No Food Insecurity (08/10/2024)   Hunger Vital Sign    Worried About Running Out of Food in the Last Year: Never true    Ran Out of Food in the Last Year: Never true  Transportation Needs: No Transportation  Needs (08/10/2024)   PRAPARE - Administrator, Civil Service (Medical): No    Lack of Transportation (Non-Medical): No  Physical Activity: Not on file  Stress: Not on file  Social Connections: Not on file     Family History: The patient's family history includes CVA in his father and maternal grandmother; Cancer - Colon in an other family member; Colon polyps in his mother; Depression in his paternal grandfather; Diabetes in his maternal grandfather, maternal grandmother, mother, and paternal grandfather; Diabetes type II in his mother; Heart disease in his father, maternal grandfather, maternal grandmother, paternal grandfather, and paternal grandmother; Hyperlipidemia in his father, maternal grandfather, maternal grandmother, paternal grandfather, and paternal grandmother; Hypertension in his father, maternal grandfather, and maternal grandmother; Kidney disease in his father; Stroke in his father. There is no history of Colon cancer, Esophageal cancer, Rectal cancer, or Stomach cancer.  ROS:   Please see the history of present illness.     All other systems reviewed and are negative.  EKGs/Labs/Other Studies Reviewed:    The following studies were reviewed today:  EKG Interpretation Date/Time:  Tuesday August 23 2024 15:17:43 EDT Ventricular Rate:  79 PR Interval:  172 QRS Duration:  82 QT Interval:  402 QTC Calculation: 460 R Axis:   -7  Text Interpretation: Normal sinus rhythm Nonspecific ST abnormality When compared with ECG of 11-Aug-2024 06:49, Questionable change in QRS axis Confirmed by Meng, Hao [phone]) on 08/23/2024 4:30:36 PM    Recent Labs: 08/07/2024: ALT 40 08/09/2024: B Natriuretic Peptide 436.5; TSH 0.713 08/12/2024: Hemoglobin 10.2; Magnesium  2.6; Platelets 320 08/14/2024: BUN 16; Creatinine, Ser 0.84; Potassium 3.8; Sodium 136  Recent Lipid Panel    Component Value Date/Time   CHOL 110 03/31/2024 1547   TRIG 44.0 03/31/2024 1547   HDL 41.10 03/31/2024 1547   CHOLHDL 3 03/31/2024 1547   VLDL 8.8 03/31/2024 1547   LDLCALC 60 03/31/2024 1547   LDLDIRECT 131.0 02/09/2023 1037     Risk Assessment/Calculations:    CHA2DS2-VASc Score = 4   This indicates a 4.8% annual risk of stroke. The patient's score is based upon: CHF History: 1 HTN History: 1 Diabetes History: 1 Stroke History: 0 Vascular Disease History: 1 Age Score: 0 Gender Score: 0          Physical Exam:    VS:  BP (!) 68/52 (BP Location: Left Arm, Patient Position: Sitting, Cuff Size: Normal)   Pulse 79   Ht 5' 11 (1.803 m)   Wt 200 lb (90.7 kg)   SpO2 92%   BMI 27.89 kg/m     Wt Readings from Last 3 Encounters:  08/23/24 200 lb (90.7 kg)  08/14/24 200 lb 9.6 oz (91 kg)  06/23/24 209 lb (94.8 kg)     GEN: Appears pale, diaphoretic, acutely ill HEENT: Normal CARDIAC: RRR, no murmurs, rubs, gallops RESPIRATORY:  Clear to auscultation without rales, wheezing or rhonchi  ABDOMEN: Soft, non-tender, non-distended MUSCULOSKELETAL:  No edema; No deformity  SKIN: Pale, diaphoretic NEUROLOGIC:  Alert and oriented x  3 PSYCHIATRIC:  Normal affect   ASSESSMENT:    1. Hypotension, unspecified hypotension type   2. Multiple falls   3. On anticoagulant therapy   4. Coronary artery disease involving native coronary artery of native heart, unspecified whether angina present   5. PAF (paroxysmal atrial fibrillation) (HCC)   6. Pericardial effusion   7. Viral myocarditis, unspecified chronicity   8. Congestive heart failure, unspecified HF chronicity, unspecified heart failure type (  HCC)   9. Hyperlipidemia, unspecified hyperlipidemia type    PLAN:    In order of problems listed above:  Hypotension Near-syncope Diaphoretic Multiple falls, on Eliquis  EKG: Normal sinus rhythm, 79 bpm, nonspecific ST abnormality C/o DOE, diaphoresis, near-syncope, brief episode of chest pain On review of medications, patient has been taking losartan  and olmesartan  which were prescribed on discharge from hospital, both meds at lowest dose. Recommending stopping ARB's Patient states his shortness of breath feels very similar to how it felt when he initially arrived to the hospital on 9/21, where he was diagnosed with a pneumomediastinum. Discussed case with DOD, Dr. Floretta, who agreed with plan to proceed to the ED via EMS, he did not personally see the patient. Recommending repeating echo to evaluate recent pericardial effusion Recommending admission to hospitalist services, cardiology can be consulted if needed  CAD s/p DES to RCA Right radial site appears well-healed, right radial pulse 2+, no ecchymosis or hematoma See symptoms as stated above Did not discuss antiplatelet adherence due to acute nature of visit, will need to be addressed at this hospital stay Continue Plavix  75 mg daily Continue Eliquis  5 mg twice daily Continue aspirin  EC 81 mg daily Likely can discontinue aspirin  on 10/24, with continuation of plavix  and eliquis  after that.  Paroxysmal atrial fibrillation EKG as stated above, patient in NSR  today QTc prolonged on Amio, D/C in hospital CHA2DS2-VASc score 2 Continue Eliquis  5 mg twice daily  Pericardial effusion Viral myocarditis Continue colchicine  0.6 mg twice daily Recommending repeat echo be performed in hospital  Congestive heart failure Cath 9/24 with markedly elevated LVEDP CXR with interstitial edema Appears euvolemic on exam Concern for undifferentiated shock on exam Continue Toprol -XL 25 mg daily Recommending holding the Lasix   HTN Hypotensive on exam BP management will need to be reconsidered following stabilization of acute illness  HLD Lipid panel 03/31/2024: Cholesterol 110, HDL 41, LDL 60, triglycerides 44 Continue Crestor  20 mg daily  Proceed directly to the ED via EMS transport for further evaluation    Cardiac Rehabilitation Eligibility Assessment  The patient has declined or is not appropriate for cardiac rehabilitation. (Sent to ED at visit)          Medication Adjustments/Labs and Tests Ordered: Current medicines are reviewed at length with the patient today.  Concerns regarding medicines are outlined above.  Orders Placed This Encounter  Procedures   EKG 12-Lead   No orders of the defined types were placed in this encounter.   Patient Instructions  YOU HAVE BEEN RECOMMENDED TO GO TO EMERGENCY DEPARTMENT BY AMBULANCE    Signed, Chantell Kunkler E Kimmerly Lora, NP  08/23/2024 5:15 PM    [facility]

## 2024-08-24 ENCOUNTER — Ambulatory Visit (HOSPITAL_COMMUNITY)

## 2024-08-24 ENCOUNTER — Encounter (HOSPITAL_COMMUNITY): Payer: Self-pay | Admitting: Internal Medicine

## 2024-08-24 ENCOUNTER — Observation Stay (HOSPITAL_BASED_OUTPATIENT_CLINIC_OR_DEPARTMENT_OTHER)

## 2024-08-24 DIAGNOSIS — I309 Acute pericarditis, unspecified: Secondary | ICD-10-CM | POA: Diagnosis not present

## 2024-08-24 DIAGNOSIS — E1165 Type 2 diabetes mellitus with hyperglycemia: Secondary | ICD-10-CM | POA: Diagnosis not present

## 2024-08-24 DIAGNOSIS — I514 Myocarditis, unspecified: Secondary | ICD-10-CM | POA: Diagnosis not present

## 2024-08-24 DIAGNOSIS — I2510 Atherosclerotic heart disease of native coronary artery without angina pectoris: Secondary | ICD-10-CM

## 2024-08-24 DIAGNOSIS — I3139 Other pericardial effusion (noninflammatory): Secondary | ICD-10-CM | POA: Diagnosis not present

## 2024-08-24 DIAGNOSIS — I952 Hypotension due to drugs: Secondary | ICD-10-CM

## 2024-08-24 DIAGNOSIS — I4891 Unspecified atrial fibrillation: Secondary | ICD-10-CM

## 2024-08-24 DIAGNOSIS — N179 Acute kidney failure, unspecified: Secondary | ICD-10-CM | POA: Diagnosis not present

## 2024-08-24 DIAGNOSIS — I480 Paroxysmal atrial fibrillation: Secondary | ICD-10-CM

## 2024-08-24 LAB — URINALYSIS, W/ REFLEX TO CULTURE (INFECTION SUSPECTED)
Bacteria, UA: NONE SEEN
Bilirubin Urine: NEGATIVE
Glucose, UA: >=500 mg/dL — AB
Hgb urine dipstick: NEGATIVE
Ketones, ur: NEGATIVE mg/dL
Leukocytes,Ua: NEGATIVE
Nitrite: NEGATIVE
Protein, ur: NEGATIVE mg/dL
Specific Gravity, Urine: 1.021 (ref 1.005–1.030)
pH: 6 (ref 5.0–8.0)

## 2024-08-24 LAB — CBC
HCT: 37.4 % — ABNORMAL LOW (ref 39.0–52.0)
Hemoglobin: 12.1 g/dL — ABNORMAL LOW (ref 13.0–17.0)
MCH: 28.5 pg (ref 26.0–34.0)
MCHC: 32.4 g/dL (ref 30.0–36.0)
MCV: 88 fL (ref 80.0–100.0)
Platelets: 260 K/uL (ref 150–400)
RBC: 4.25 MIL/uL (ref 4.22–5.81)
RDW: 13.6 % (ref 11.5–15.5)
WBC: 7.4 K/uL (ref 4.0–10.5)
nRBC: 0 % (ref 0.0–0.2)

## 2024-08-24 LAB — ECHOCARDIOGRAM LIMITED
Area-P 1/2: 2.76 cm2
Height: 71 in
S' Lateral: 3.92 cm
Weight: 3199.32 [oz_av]

## 2024-08-24 LAB — COMPREHENSIVE METABOLIC PANEL WITH GFR
ALT: 17 U/L (ref 0–44)
AST: 15 U/L (ref 15–41)
Albumin: 3.1 g/dL — ABNORMAL LOW (ref 3.5–5.0)
Alkaline Phosphatase: 49 U/L (ref 38–126)
Anion gap: 10 (ref 5–15)
BUN: 21 mg/dL (ref 8–23)
CO2: 25 mmol/L (ref 22–32)
Calcium: 8.7 mg/dL — ABNORMAL LOW (ref 8.9–10.3)
Chloride: 97 mmol/L — ABNORMAL LOW (ref 98–111)
Creatinine, Ser: 1.23 mg/dL (ref 0.61–1.24)
GFR, Estimated: >60 mL/min (ref >60)
Glucose, Bld: 116 mg/dL — ABNORMAL HIGH (ref 70–99)
Potassium: 4.2 mmol/L (ref 3.5–5.1)
Sodium: 132 mmol/L — ABNORMAL LOW (ref 135–145)
Total Bilirubin: 0.9 mg/dL (ref 0.0–1.2)
Total Protein: 6 g/dL — ABNORMAL LOW (ref 6.5–8.1)

## 2024-08-24 LAB — CORTISOL: Cortisol, Plasma: 7.5 ug/dL

## 2024-08-24 LAB — GLUCOSE, CAPILLARY
Glucose-Capillary: 152 mg/dL — ABNORMAL HIGH (ref 70–99)
Glucose-Capillary: 94 mg/dL (ref 70–99)

## 2024-08-24 LAB — CBG MONITORING, ED
Glucose-Capillary: 74 mg/dL (ref 70–99)
Glucose-Capillary: 82 mg/dL (ref 70–99)

## 2024-08-24 NOTE — Consult Note (Signed)
 Cardiology Consultation   Patient ID: Jeffrey Black. MRN: [id number]; DOB: Jun 24, 1961  Admit date: 08/23/2024 Date of Consult: 08/24/2024  PCP:  [name], MD   [facility] Providers Cardiologist:  Wilbert Bihari, MD        Patient Profile: Jeffrey Soward. is a 63 y.o. male with a hx of type 2 diabetes, alcohol  abuse, paroxysmal atrial fibrillation, diastolic heart failure, myocarditis, CAD status post PCI, psoriatic arthritis, and pericardial effusion who is being seen 08/24/2024 for the evaluation of hypotension and pericardial effusion at the request of Sabas Brod MD.  History of Present Illness: Jeffrey Black is a 63 year old male with prior cardiac history listed below.  On 08/07/2024 the patient presented to the emergency department complaining of chest pain and syncope.  On arrival he was hypotensive with systolic blood pressures in the 60s.  The patient was started on Levophed  and the blood pressure improved.  CT imaging showed a ground glass opacity in the left lower lobe and a small pneumomediastinum.  High-sensitivity troponins were 1350 > 1336.  The patient also had a WBC count of 17.7 and a lactic acid of 2.4.  Cardiology was consulted and felt the etiology of the shock was infectious rather than cardiogenic.  The following day a TTE was performed showed a normal LVEF of 55 to 60%, moderate concentric LVH, a mildly reduced RV systolic function, small to moderate pericardial effusion, and IVC with less than 50% respiratory variability.  On 08/09/2024 the patient went into new onset A-fib with RVR and was started on amiodarone .  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 A limited echo was also done but showed no change in the pericardial effusion.  Because of concerns for myocarditis the patient was started on colchicine .  On  08/10/2024 a left heart cath was done and showed mild nonobstructive CAD in the LAD and D2, and 65% stenosis in the distal RCA.  A drug-eluting stent was placed in the distal RCA.  The patient was placed on triple therapy with Eliquis , aspirin , and Plavix  for a month.  Following this the patient was recommended to remain on Plavix  for 6 months.  Because the cath also showed an elevated LVEDP the patient received additional diuresis with IV Lasix .  The patient was seen for outpatient follow-up on 08/23/2024.  At that visit the patient appeared acutely ill, had a blood pressure of 70/40, and reported worsening shortness of breath over the past week.  Because of these concerns the patient was recommended to go to the emergency department.  Upon arrival at the emergency department the patients blood pressure had improved slightly.  Today at a 8:46 AM the patient had a blood pressure of 101/60.  Labs showed slight hyponatremia 132, potassium of 4.2, hypochloremia of 97, creatinine of 1.23, hypocalcemia of 8.7, hypoalbuminemia of 3.1, normal WBC count of 7.4, and anemia with a hemoglobin of 12.1.  A respiratory panel was checked and was negative.  Pulmonary CTA was done and showed a moderate pericardial effusion that was worse from prior imaging, no evidence of PE, no evidence of aortic aneurysm or dissection, no evidence of active pulmonary disease, and a small hiatal hernia.  EKG showed normal sinus rhythm with a rate of 69 and a borderline prolonged Qtc with a QTcB of about 460.  On interview the patient affirmed  the above history.  The patient reported that he fell 3 times on Monday night and that he felt diaphoretic, lightheaded and dizzy.  Reported that he checked his blood pressure at home and it was low with systolic pressures in the 70s.  In addition to this had worsening dyspnea on exertion and an intermittent sharp stabbing chest pain.  Denies any exertional or pleuritic chest pain.  The patient has  been limiting his physical activity since the last hospitalization.  Denies orthopnea, PND, lower extremity edema, melena, hematochezia, hematuria, fever, chills, nausea, and vomiting. Reported that he used to smoke prior to the hospitalization on 9/25 but has not smoked since.  Denied any recent alcohol  use.  Denies marijuana use or other illicit substance use.    Past Medical History:  Diagnosis Date   Depression    Diabetes (HCC)    ETOH abuse    History of bronchitis    Hypertension    Psoriasis    Substance abuse (HCC)     Past Surgical History:  Procedure Laterality Date   CORONARY STENT INTERVENTION N/A 08/10/2024   Procedure: CORONARY STENT INTERVENTION;  Surgeon: Anner Alm ORN, MD;  Location: [facility] INVASIVE CV LAB;  Service: Cardiovascular;  Laterality: N/A;   ESOPHAGOGASTRODUODENOSCOPY N/A 05/03/2013   Procedure: ESOPHAGOGASTRODUODENOSCOPY (EGD);  Surgeon: [name], MD;  Location: [facility];  Service: Endoscopy;  Laterality: N/A;   left 4th finger surgery     LEFT HEART CATH AND CORONARY ANGIOGRAPHY N/A 08/10/2024   Procedure: LEFT HEART CATH AND CORONARY ANGIOGRAPHY;  Surgeon: Anner Alm ORN, MD;  Location: [facility] INVASIVE CV LAB;  Service: Cardiovascular;  Laterality: N/A;     Home Medications:  Prior to Admission medications   Medication Sig Start Date End Date Taking? Authorizing Provider  apixaban  (ELIQUIS ) 5 MG TABS tablet Take 1 tablet (5 mg total) by mouth 2 (two) times daily. 08/14/24  Yes Patsy Alm, MD  aspirin  EC 81 MG tablet Take 1 tablet (81 mg total) by mouth daily for 25 days. Swallow whole. 08/15/24 09/09/24 Yes Patsy Alm, MD  clopidogrel  (PLAVIX ) 75 MG tablet Take 1 tablet (75 mg total) by mouth daily with breakfast. 08/15/24  Yes Patsy Alm, MD  colchicine  0.6 MG tablet Take 1 tablet (0.6 mg total) by mouth 2 (two) times daily. 08/14/24 11/12/24 Yes Patsy Alm, MD  empagliflozin  (JARDIANCE ) 25 MG TABS tablet Take 1 tablet (25 mg total)  by mouth daily before breakfast. 03/31/24  Yes [name], MD  furosemide  (LASIX ) 40 MG tablet Take 1 tablet (40 mg total) by mouth 2 (two) times daily. 08/14/24  Yes Patsy Alm, MD  ibuprofen  (ADVIL ) 200 MG tablet Take 200 mg by mouth every 6 (six) hours as needed for mild pain (pain score 1-3) or moderate pain (pain score 4-6).   Yes [provider]  losartan  (COZAAR ) 25 MG tablet Take 1 tablet (25 mg total) by mouth daily. 08/15/24  Yes Patsy Alm, MD  metFORMIN  (GLUCOPHAGE ) 500 MG tablet Take 2 tablets (1,000 mg total) by mouth 2 (two) times daily with a meal. 04/22/24  Yes [name], MD  metoprolol  succinate (TOPROL -XL) 25 MG 24 hr tablet Take 1 tablet (25 mg total) by mouth daily. 08/15/24  Yes Patsy Alm, MD  naproxen  sodium (ALEVE ) 220 MG tablet Take 440 mg by mouth 2 (two) times daily as needed (pain, headache).   Yes [provider]  olmesartan  (BENICAR ) 5 MG tablet Take 1 tablet (5 mg total) by  mouth daily. 03/31/24  Yes [name], MD  rosuvastatin  (CRESTOR ) 20 MG tablet Take 1 tablet (20 mg total) by mouth daily. 08/15/24  Yes Patsy Lenis, MD  Secukinumab  (COSENTYX  UNOREADY) 300 MG/2ML SOAJ Inject 300 mg into the skin every 28 (twenty-eight) days. 08/19/24  Yes Rice, Lonni ORN, MD    Scheduled Meds:  apixaban   5 mg Oral BID   aspirin  EC  81 mg Oral Daily   clopidogrel   75 mg Oral Daily   colchicine   0.6 mg Oral BID   insulin  aspart  0-15 Units Subcutaneous TID WC   insulin  aspart  0-5 Units Subcutaneous QHS   metoprolol  succinate  25 mg Oral Daily   rosuvastatin   20 mg Oral Daily   Continuous Infusions:  sodium chloride  100 mL/hr at 08/23/24 2331   lactated ringers  150 mL/hr at 08/23/24 1740   PRN Meds: ondansetron  **OR** ondansetron  (ZOFRAN ) IV  Allergies:   No Known Allergies  Social History:   Social History   Socioeconomic History   Marital status: Divorced    Spouse name: Not on file   Number of children: Not  on file   Years of education: Not on file   Highest education level: Not on file  Occupational History   Not on file  Tobacco Use   Smoking status: Every Day    Current packs/day: 1.00    Average packs/day: 1 pack/day for 34.0 years (34.0 ttl pk-yrs)    Types: Cigarettes    Passive exposure: Past   Smokeless tobacco: Never  Vaping Use   Vaping status: Never Used  Substance and Sexual Activity   Alcohol  use: Not Currently   Drug use: No   Sexual activity: Not Currently  Other Topics Concern   Not on file  Social History Narrative   Not on file   Social Drivers of Health   Financial Resource Strain: Not on file  Food Insecurity: No Food Insecurity (08/10/2024)   Hunger Vital Sign    Worried About Running Out of Food in the Last Year: Never true    Ran Out of Food in the Last Year: Never true  Transportation Needs: No Transportation Needs (08/10/2024)   PRAPARE - Administrator, Civil Service (Medical): No    Lack of Transportation (Non-Medical): No  Physical Activity: Not on file  Stress: Not on file  Social Connections: Not on file  Intimate Partner Violence: Not At Risk (08/10/2024)   Humiliation, Afraid, Rape, and Kick questionnaire    Fear of Current or Ex-Partner: No    Emotionally Abused: No    Physically Abused: No    Sexually Abused: No    Family History:    Family History  Problem Relation Age of Onset   Colon polyps Mother    Diabetes Mother    Diabetes type II Mother    Kidney disease Father    Hypertension Father    Hyperlipidemia Father    Heart disease Father    Stroke Father    CVA Father    CVA Maternal Grandmother    Hypertension Maternal Grandmother    Hyperlipidemia Maternal Grandmother    Heart disease Maternal Grandmother    Diabetes Maternal Grandmother    Hypertension Maternal Grandfather    Hyperlipidemia Maternal Grandfather    Heart disease Maternal Grandfather    Diabetes Maternal Grandfather    Hyperlipidemia Paternal  Grandmother    Heart disease Paternal Grandmother    Hyperlipidemia Paternal Actor  Heart disease Paternal Grandfather    Diabetes Paternal Grandfather    Depression Paternal Grandfather    Cancer - Colon Other    Colon cancer Neg Hx    Esophageal cancer Neg Hx    Rectal cancer Neg Hx    Stomach cancer Neg Hx      ROS:  Please see the history of present illness.   All other ROS reviewed and negative.     Physical Exam/Data: Vitals:   08/24/24 0400 08/24/24 0500 08/24/24 0538 08/24/24 0846  BP: 100/68 113/82 113/82 101/60  Pulse: 85 89 83 72  Resp:   12 17  Temp:   98.4 F (36.9 C) 98 F (36.7 C)  TempSrc:   Oral Oral  SpO2: 91% 94% 94% 100%  Weight:      Height:        Intake/Output Summary (Last 24 hours) at 08/24/2024 1107 Last data filed at 08/24/2024 0951 Gross per 24 hour  Intake --  Output 1950 ml  Net -1950 ml      08/23/2024    7:50 PM 08/23/2024    3:14 PM 08/14/2024    3:41 AM  Last 3 Weights  Weight (lbs) 199 lb 15.3 oz 200 lb 200 lb 9.6 oz  Weight (kg) 90.7 kg 90.719 kg 90.992 kg     Body mass index is 27.89 kg/m.  General:  Well nourished, well developed, in no acute distress.  Alert and orientated on room air. HEENT: normal Neck: no JVD Vascular: No carotid bruits; Distal pulses 2+ bilaterally Cardiac:  normal S1, S2; RRR; no murmur  Lungs:  clear to auscultation bilaterally, no wheezing, rhonchi or rales  Abd: soft, nontender, no hepatomegaly  Ext: no edema Musculoskeletal:  No deformities Skin: warm and dry  Neuro:   no focal abnormalities noted Psych:  Normal affect   EKG:  The EKG was personally reviewed and demonstrates:  EKG showed normal sinus rhythm with a rate of 69 and a borderline prolonged Qtc with a QTcB of about 460. Telemetry:  Telemetry was personally reviewed and demonstrates: Normal sinus rhythm with heart rates in the 60s to 80s  Relevant CV Studies: Echo pending  Laboratory Data: High Sensitivity Troponin:    Recent Labs  Lab 08/08/24 1022 08/08/24 1730 08/09/24 1443 08/23/24 1731 08/23/24 2016  TROPONINIHS 2,114* 1,143* 1,076* 15 14     Chemistry Recent Labs  Lab 08/23/24 1731 08/23/24 1742 08/24/24 0348  NA 133* 134* 132*  K 4.4 4.4 4.2  CL 96* 98 97*  CO2 25  --  25  GLUCOSE 80 77 116*  BUN 28* 29* 21  CREATININE 1.66* 1.70* 1.23  CALCIUM  9.3  --  8.7*  GFRNONAA 46*  --  >60  ANIONGAP 12  --  10    Recent Labs  Lab 08/23/24 1731 08/24/24 0348  PROT 6.7 6.0*  ALBUMIN  3.5 3.1*  AST 17 15  ALT 19 17  ALKPHOS 53 49  BILITOT 0.7 0.9   Lipids No results for input(s): CHOL, TRIG, HDL, LABVLDL, LDLCALC, CHOLHDL in the last 168 hours.  Hematology Recent Labs  Lab 08/23/24 1731 08/23/24 1742 08/24/24 0348  WBC 8.7  --  7.4  RBC 4.55  --  4.25  HGB 12.9* 13.6 12.1*  HCT 40.4 40.0 37.4*  MCV 88.8  --  88.0  MCH 28.4  --  28.5  MCHC 31.9  --  32.4  RDW 13.8  --  13.6  PLT 300  --  260   Thyroid  No results for input(s): TSH, FREET4 in the last 168 hours.  BNPNo results for input(s): BNP, PROBNP in the last 168 hours.  DDimer No results for input(s): DDIMER in the last 168 hours.  Radiology/Studies:  CT Angio Chest PE W and/or Wo Contrast Result Date: 08/23/2024 CLINICAL DATA:  Pulmonary embolism suspected with high probability. EXAM: CT ANGIOGRAPHY CHEST WITH CONTRAST TECHNIQUE: Multidetector CT imaging of the chest was performed using the standard protocol during bolus administration of intravenous contrast. Multiplanar CT image reconstructions and MIPs were obtained to evaluate the vascular anatomy. RADIATION DOSE REDUCTION: This exam was performed according to the departmental dose-optimization program which includes automated exposure control, adjustment of the mA and/or kV according to patient size and/or use of iterative reconstruction technique. CONTRAST:  65mL OMNIPAQUE  IOHEXOL  350 MG/ML SOLN COMPARISON:  Chest radiograph 08/23/2024. CT  chest abdomen and pelvis 08/07/2024. Cardiac MRI 08/09/2024 the FINDINGS: Cardiovascular: Technically adequate study with good opacification of the central and segmental pulmonary arteries. No focal filling defects. No evidence of significant pulmonary embolus. Mild cardiac enlargement. Moderate pericardial effusion increasing since prior study. Normal caliber thoracic aorta. Great vessel origins are patent. No aortic dissection. Mediastinum/Nodes: Small esophageal hiatal hernia. Esophagus is decompressed. No significant lymphadenopathy. Thyroid  gland is unremarkable. Lungs/Pleura: Mild dependent atelectasis in the lung bases. No airspace disease or consolidation. No pleural effusion or pneumothorax. Upper Abdomen: No acute abnormality. Musculoskeletal: No chest wall abnormality. No acute or significant osseous findings. Review of the MIP images confirms the above findings. IMPRESSION: 1. No evidence of significant pulmonary embolus. 2. No evidence of aortic aneurysm or dissection. 3. Cardiac enlargement. Moderate pericardial effusion is increasing since the prior study. 4. No evidence of active pulmonary disease. 5. Small esophageal hiatal hernia. Electronically Signed   By: Elsie Gravely M.D.   On: 08/23/2024 18:55   DG Chest Port 1 View Result Date: 08/23/2024 CLINICAL DATA:  Chest pain. EXAM: PORTABLE CHEST 1 VIEW COMPARISON:  August 11, 2024. FINDINGS: The heart size and mediastinal contours are within normal limits. Both lungs are clear. The visualized skeletal structures are unremarkable. IMPRESSION: No active disease. Electronically Signed   By: Lynwood Landy Raddle M.D.   On: 08/23/2024 18:14     Assessment and Plan:  Jeffrey Pulse. is a 63 y.o. male with a hx of type 2 diabetes, alcohol  abuse, paroxysmal atrial fibrillation, diastolic heart failure, myocarditis, CAD status post PCI, psoriatic arthritis, and pericardial effusion who is being seen 08/24/2024 for the evaluation of hypotension  and pericardial effusion at the request of Sabas Brod MD.   Pericardial effusion Myocarditis On 07/2024 patient was hospitalized with chest pain, hypotension, and syncope.  Patient had an echocardiogram that showed a small to moderate pericardial effusion.  Later during the same hospitalization a repeat limited echo was done and showed the effusion was stable.  The patient had leukocytosis and the cardiac MRI indicated the patient had myocarditis.  Because of this the patient was started on colchicine .  The patient was seen for an outpatient follow-up and appeared acutely ill with a blood pressure of 70/40.  Because of this the patient was sent to the emergency department. Pulmonary CTA in the emergency department found moderate pericardial effusion. Ordered limited echo Continue colchicine    Hypotension Syncope/presyncope In addition to having low blood pressure as mentioned above.  Patient also reported falling 3 times on Monday night and feeling diaphoretic, lightheaded, and dizzy.  The patient reported checking his blood pressure  at home and that it was low.  The patient reported that he was on Benicar  and losartan  at home.  In addition to taking 2 ARB's the patient was also on metoprolol , and Lasix .  The patient's losartan  and Benicar  were held upon arrival to the emergency department and his blood pressure improved. Today at a 8:46 AM the patient had a blood pressure of 101/60. Stop losartan  and Benicar . Stop metoprolol  XL 25 mg daily   Atrial fibrillation CHA2DS2-VASc Score = 4 [CHF History: 1, HTN History: 1, Diabetes History: 1, Stroke History: 0, Vascular Disease History: 1, Age Score: 0, Gender Score: 0].  Therefore, the patient's annual risk of stroke is 4.8 %.    Was initially diagnosed with atrial fibrillation on 07/2024 in the setting of infectious shock and acute myocarditis.  Suspect this played a significant role in provoking the atrial fibrillation. Continue Eliquis  5 mg twice  daily (correct dose)   Diastolic heart failure Was diagnosed on prior hospitalization on 9/25 Appears euvolemic on exam Stop Lasix  40 mg twice daily GDMT Continue Jardiance  25 mg daily.  Also on this for type 2 diabetes.  CAD status post PCI to the RCA Hyperlipidemia On prior hospitalization on 9/25 the patient had a cardiac catheterization that showed 60% stenosis in the distal RCA and a drug-eluting stent was placed.  Following this the patient was placed on triple therapy with Eliquis , aspirin , and Plavix . Stop aspirin  81 mg daily Continue Plavix  75 mg daily Continue Eliquis  as above Continue Crestor  20 mg daily   Otherwise management per primary   Risk Assessment/Risk Scores:       New York  Heart Association (NYHA) Functional Class NYHA Class II  CHA2DS2-VASc Score = 4  {This indicates a 4.8% annual risk of stroke. The patient's score is based upon: CHF History: 1 HTN History: 1 Diabetes History: 1 Stroke History: 0 Vascular Disease History: 1 Age Score: 0 Gender Score: 0        For questions or updates, please contact [facility] Please consult [email or web address] for contact info under     Signed, Jeffrey Clause, PA-C  08/24/2024 11:07 AM

## 2024-08-24 NOTE — Progress Notes (Signed)
 [facility]  PROGRESS NOTE  Jeffrey Black Jeffrey Black. FMW:[id number] DOB: 19-May-1961 DOA: 08/23/2024 PCP: [name], MD   Brief HPI:    63 y.o. male with medical history significant of diabetes, alcohol  abuse, history of bronchitis, essential hypertension, psoriatic arthritis, history of polysubstance abuse previously, depression with anxiety, who was recently admitted with chest pain and syncope on September 21.  Patient had pneumomediastinum and shock.  He was in the ICU.  He developed paroxysmal atrial fibrillation with pericardial effusions.  Patient had coronary artery disease and had PCI done developed myocarditis as well as has history of diastolic congestive heart failure.  Patient was hypotensive during recent hospitalization and was on Levophed  in the ICU.  At that time he ruled in with troponin as high as 1350.  Cardiology was consulted and patient was deemed to have noncardiac etiology of his rising troponin.  He had a left heart cath that showed distal RCA lesion up to 65%.  She has PCI performed and discharged on DAPT.  Patient went to his cardiology appointment today but was found to be hypotensive.  This was similar to his previous presentation.  His initial blood pressure was 70/40.  He was diaphoretic pale and near syncopal.  He has been progressively feeling bad since last week when he left the hospital.  He has been weak.  Patient sent to the ER for further evaluation and treatment.  In the ER him continues to have low blood pressure despite fluids resuscitation.  Systolic has been in the 90s now.  He is pericardial effusion was noted after and showed bedside echo.  No significant change from previous.  No evidence of bleed.  He is fully awake alert and communicative.  Cardiology consulted and we admitted the patient to the hospital for evaluation of persistent hypotension.     Assessment/Plan:   #1 persistent hypotension: Patient does not appear to be having any sepsis or  septic shock.  Empirically started on antibiotics.  Hypotension is likely medication induced.    Cultures have been obtained and will monitor closely.  IV antibiotics were started and are currently discontinued.  Cortisol level obtained.  Cardiology consulted, blood pressure medications are currently on hold.  Stopped olmesartan , metoprolol , Lasix    #2 pericardial effusion: Moderate at this point.  Defer to cardiology.  Repeat echocardiogram ordered   #3 type 2 diabetes: Continue sliding scale insulin  with NovoLog .  CBG well-controlled   #4 recent myocarditis: Recovering.  Cardiology following   #5 AKI: Most likely prerenal.  Resolved with IV hydration.   #6 depression: Continue antidepressants.    Medications     apixaban   5 mg Oral BID   clopidogrel   75 mg Oral Daily   colchicine   0.6 mg Oral BID   insulin  aspart  0-15 Units Subcutaneous TID WC   insulin  aspart  0-5 Units Subcutaneous QHS   rosuvastatin   20 mg Oral Daily     Data Reviewed:   CBG:  Recent Labs  Lab 08/23/24 2317 08/24/24 0753 08/24/24 1125  GLUCAP 85 74 82    SpO2: 97 %    Vitals:   08/24/24 1200 08/24/24 1226 08/24/24 1300 08/24/24 1328  BP: 113/86  95/62 103/65  Pulse: 60  65 68  Resp: 16  20 19   Temp:  98 F (36.7 C) 98.2 F (36.8 C) 97.9 F (36.6 C)  TempSrc:  Oral Oral Oral  SpO2: 100%  100% 97%  Weight:      Height:  Data Reviewed:  Basic Metabolic Panel: Recent Labs  Lab 08/23/24 1731 08/23/24 1742 08/24/24 0348  NA 133* 134* 132*  K 4.4 4.4 4.2  CL 96* 98 97*  CO2 25  --  25  GLUCOSE 80 77 116*  BUN 28* 29* 21  CREATININE 1.66* 1.70* 1.23  CALCIUM  9.3  --  8.7*    CBC: Recent Labs  Lab 08/23/24 1731 08/23/24 1742 08/24/24 0348  WBC 8.7  --  7.4  NEUTROABS 4.7  --   --   HGB 12.9* 13.6 12.1*  HCT 40.4 40.0 37.4*  MCV 88.8  --  88.0  PLT 300  --  260    LFT Recent Labs  Lab 08/23/24 1731 08/24/24 0348  AST 17 15  ALT 19 17  ALKPHOS 53 49   BILITOT 0.7 0.9  PROT 6.7 6.0*  ALBUMIN  3.5 3.1*     Antibiotics: Anti-infectives (From admission, onward)    Start     Dose/Rate Route Frequency Ordered Stop   08/23/24 1815  ceFEPIme (MAXIPIME) 2 g in sodium chloride  0.9 % 100 mL IVPB        2 g 200 mL/hr over 30 Minutes Intravenous  Once 08/23/24 1807 08/23/24 1904   08/23/24 1800  vancomycin  (VANCOCIN ) IVPB 1000 mg/200 mL premix        1,000 mg 200 mL/hr over 60 Minutes Intravenous  Once 08/23/24 1750 08/23/24 2300   08/23/24 1800  metroNIDAZOLE (FLAGYL) IVPB 500 mg        500 mg 100 mL/hr over 60 Minutes Intravenous  Once 08/23/24 1750 08/23/24 2155        DVT prophylaxis: Apixaban   Code Status: Full code  Family Communication:    CONSULTS    Subjective   Patient seen and examined.  Denies chest pain.  Denies shortness of breath.  Blood pressure has improved   Objective    Physical Examination:   General-appears in no acute distress Heart-S1-S2, regular, no murmur auscultated Lungs-clear to auscultation bilaterally, no wheezing or crackles auscultated Abdomen-soft, nontender, no organomegaly Extremities-no edema in the lower extremities Neuro-alert, oriented x3, no focal deficit noted  Status is: Inpatient:             [name]   [facility] If 7PM-7AM, please contact night-coverage at [email or web address], Office  [phone]   08/24/2024, 1:32 PM  LOS: 1 day

## 2024-08-24 NOTE — Progress Notes (Signed)
  Echocardiogram 2D Echocardiogram has been performed.  Jeffrey Black 08/24/2024, 3:12 PM

## 2024-08-24 NOTE — ED Notes (Signed)
 Introduced self to patient at this time. Patient is resting in bed with visible chest rise and fall. The call light is in reach. The urinal was emptied. There are no further requests at this time.

## 2024-08-24 NOTE — Progress Notes (Signed)
 Office Visit Note  Patient: Jeffrey Black.             Date of Birth: 1961-11-03           MRN: [id number]             PCP: Jeffrey Heron HERO, MD Referring: Jeffrey Heron HERO, MD Visit Date: 09/07/2024   Subjective:  Other (Patient states he has been in the hospital twice and had a heart catheterization. Patient states he has been holding the Cosentyx . Patient states his psoriasis has come back on his face. )    Discussed the use of AI scribe software for clinical note transcription with the patient, who gave verbal consent to proceed.  History of Present Illness   Jeffrey Black. is a 63 y.o. male here for follow up with psoriasis and psoriatic arthritis who presents for follow-up on Cosentyx  300 mg [place] monthly.   He was recently hospitalized due to a viral infection that led to a significant increase in his white blood cell count and a decrease in red blood cells. During his hospital stay, he developed atrial fibrillation with rapid ventricular response on the third day, requiring intensive care for two to three days. His blood pressure was critically low at one point, recorded at greater than 50/25 mmHg, necessitating intensive care. He was told that there was a 65% blockage in the right circumflex artery this was treated with stenting. Post-hospitalization, he experienced an episode of low blood pressure (70/50 mmHg) while at a heart care clinic, leading to another ambulance call. He has been on hold for three weeks to allow his body to recover from the infection and the heart scan surgery.  The cosentyx  has significantly improved his condition, reducing symptoms in his knees and shoulders. However, he noticed a recurrence of symptoms on his face during interruption of his injections, which he describes as a critical area for him. He has not experienced any issues with Cosentyx  in the past six months.  He is currently on multiple medications, which he feels may have  contributed to his low blood pressure episode. He is monitoring his blood pressure and heart rate closely, noting fluctuations in both. His heart rate was recorded at 124 beats per minute in the hospital, with blood pressure readings of 120/75 mmHg.      Previous HPI 06/01/2024 Jeffrey Black. is a 63 y.o. male here for follow up with psoriasis and psoriatic arthritis who presents for follow-up on Cosentyx  300 mg [place] monthly.   He has experienced significant improvement in his psoriasis symptoms since starting Cosentyx . The condition is now described as 'very livable', with reduced severity and no longer affecting his feet. During the initial four weeks of treatment with weekly injections, there was rapid improvement. Currently, the psoriasis is mostly cleared, with only some residual redness and no scaling.   He denies using any over-the-counter anti-inflammatory medications such as ibuprofen , Motrin , Advil , or Aleve . The psoriasis has cleared from his face, where it was previously present. He mentions a small rash that appeared but has since resolved. No rash around the injection site and no side effects or illness since starting the injections.   He does not use any topical treatments like lotions or creams for his psoriasis, although he recalls using a tube of medication for his face in the past. He is open to using triamcinolone  for any remaining mild areas if needed.   He mentions a positive result  from a colon guard test screening without associated symptoms.   No soreness or restriction in shoulder movement, and no trouble sleeping due to joint issues.        Previous HPI 02/24/2024 Jeffrey Black. is a 63 year old male with psoriasis and psoriatic arthritis who presents for follow-up on Cosentyx  treatment after starting first dose 01/12/24.   He has experienced significant improvement in his symptoms since starting Cosentyx . Joint pain resolved approximately ten days after each  injection, and he has not experienced any joint pain since then. He describes this as the 'biggest thing' and is pleased with the outcome.   Regarding his psoriasis, most of the plaques have cleared, although some red spots remain. These plaques have been present for eighteen years, and he is surprised by the improvement. His fingernails, which used to be ridged, have also improved.   He experiences a runny nose for about four days following each Cosentyx  injection, but this is the only side effect he reports.   He mentions a past diagnosis of fatty liver. His ALT and AST levels were previously elevated due to alcohol  use, but they have normalized since he stopped drinking ten years ago. He is concerned about potential liver effects from Cosentyx .   He inquires about the Shingrix vaccine, confirming it is safe to take with Cosentyx . He expresses concern about shingles due to his father's history with the condition.      Previous HPI 11/26/23 Jeffrey Black. is a 63 y.o. male here for evaluation and management of psoriasis and psoriatic arthritis.  He has a history of psoriasis diagnosed in 2004, presents with worsening skin and joint symptoms. They report a significant flare-up of their psoriasis in 2007, which was managed by a dermatologist. However, due to insurance limitations, they were unable to access biologic therapies such as Stelara, Humira, and Enbrel. Instead, they were treated with methotrexate and topical treatments.   In 2010, the patient began experiencing joint pain, initially in the right shoulder, which later migrated to the left shoulder. The pain was intermittent, lasting for several weeks at a time before subsiding. The patient reports that the joint pain disappeared for a period of approximately eight years, from 2016 to 2024, but has recently returned. The pain is now localized in the right knee and a finger, with no visible swelling noted.   The patient's psoriasis has  also worsened over time, with the rash now covering the torso, buttocks, and thighs. Recently, the patient has noticed the rash appearing on their face, which is a new development in the past six months. The patient reports that the severity of the rash and joint pain seems to fluctuate in tandem.   The patient has been managing their symptoms with over-the-counter treatments and a topical medication, Taclonex, but reports that these treatments only provide temporary relief. The patient has not seen a dermatologist recently and is not currently on any prescribed medication for arthritis.   The patient has a history of heavy alcohol  use, which resulted in a diagnosis of fatty liver in 2014. They have been abstinent from alcohol  for the past ten years. The patient has no history of joint injuries, surgeries, or gout. They have no family history of autoimmune conditions, although their mother and sister both had scalp psoriasis.   The patient lives alone and is concerned about the potential impact of their worsening symptoms on their ability to maintain independence. They are seeking treatment options to manage  their psoriasis and joint pain.    Review of Systems  Constitutional:  Negative for fatigue.  HENT:  Negative for mouth sores and mouth dryness.   Eyes:  Negative for dryness.  Respiratory:  Positive for shortness of breath.   Cardiovascular:  Negative for chest pain and palpitations.  Gastrointestinal:  Negative for blood in stool, constipation and diarrhea.  Endocrine: Negative for increased urination.  Genitourinary:  Negative for involuntary urination.  Musculoskeletal:  Negative for joint pain, gait problem, joint pain, joint swelling, myalgias, muscle weakness, morning stiffness, muscle tenderness and myalgias.  Skin:  Positive for color change and rash. Negative for hair loss and sensitivity to sunlight.  Allergic/Immunologic: Negative for susceptible to infections.  Neurological:   Negative for dizziness and headaches.  Hematological:  Negative for swollen glands.  Psychiatric/Behavioral:  Negative for depressed mood and sleep disturbance. The patient is not nervous/anxious.     PMFS History:  Patient Active Problem List   Diagnosis Date Noted   Coronary artery disease involving native coronary artery of native heart without angina pectoris 08/24/2024   PAF (paroxysmal atrial fibrillation) (HCC) 08/24/2024   Hypotension 08/23/2024   AKI (acute kidney injury) 08/23/2024   Orthostatic hypotension 08/10/2024   Acute myopericarditis 08/10/2024   Myocarditis (HCC) 08/09/2024   Pneumomediastinum (HCC) 08/09/2024   Elevated troponin 08/09/2024   Chest pain of uncertain etiology 08/09/2024   Pericardial effusion 08/09/2024   Atrial fibrillation with RVR (HCC) 08/09/2024   Shock (HCC) 08/07/2024   NSTEMI (non-ST elevated myocardial infarction) (HCC) 08/07/2024   High risk medication use 11/26/2023   Alcohol  use disorder in remission 09/24/2023   Psoriatic arthritis (HCC) 05/19/2023   Hypertension 05/19/2023   Type 2 diabetes mellitus with hyperglycemia, without long-term current use of insulin  (HCC) 02/11/2023   Elevated blood pressure reading 02/11/2023   Psoriasis 07/28/2013   Elevated liver enzymes 02/22/2013   Depression 02/22/2013    Past Medical History:  Diagnosis Date   Depression    Diabetes (HCC)    ETOH abuse    History of bronchitis    Hypertension    Psoriasis    Substance abuse (HCC)     Family History  Problem Relation Age of Onset   Colon polyps Mother    Diabetes Mother    Diabetes type II Mother    Kidney disease Father    Hypertension Father    Hyperlipidemia Father    Heart disease Father    Stroke Father    CVA Father    CVA Maternal Grandmother    Hypertension Maternal Grandmother    Hyperlipidemia Maternal Grandmother    Heart disease Maternal Grandmother    Diabetes Maternal Grandmother    Hypertension Maternal  Grandfather    Hyperlipidemia Maternal Grandfather    Heart disease Maternal Grandfather    Diabetes Maternal Grandfather    Hyperlipidemia Paternal Grandmother    Heart disease Paternal Grandmother    Hyperlipidemia Paternal Grandfather    Heart disease Paternal Grandfather    Diabetes Paternal Grandfather    Depression Paternal Grandfather    Cancer - Colon Other    Colon cancer Neg Hx    Esophageal cancer Neg Hx    Rectal cancer Neg Hx    Stomach cancer Neg Hx    Past Surgical History:  Procedure Laterality Date   CORONARY STENT INTERVENTION N/A 08/10/2024   Procedure: CORONARY STENT INTERVENTION;  Surgeon: Anner Alm ORN, MD;  Location: [facility];  Service: Cardiovascular;  Laterality: N/A;  ESOPHAGOGASTRODUODENOSCOPY N/A 05/03/2013   Procedure: ESOPHAGOGASTRODUODENOSCOPY (EGD);  Surgeon: [name], MD;  Location: [facility];  Service: Endoscopy;  Laterality: N/A;   left 4th finger surgery     LEFT HEART CATH AND CORONARY ANGIOGRAPHY N/A 08/10/2024   Procedure: LEFT HEART CATH AND CORONARY ANGIOGRAPHY;  Surgeon: Anner Alm ORN, MD;  Location: [facility] INVASIVE CV LAB;  Service: Cardiovascular;  Laterality: N/A;   Social History   Social History Narrative   Not on file   Immunization History  Administered Date(s) Administered   Influenza, Seasonal, Injecte, Preservative Fre 08/14/2024   PFIZER(Purple Top)SARS-COV-2 Vaccination 01/13/2020, 09/11/2020   Pneumococcal Polysaccharide-23 07/24/2013   Tdap 11/08/2012     Objective: Vital Signs: BP (!) 136/97   Pulse (!) 105   Temp 97.7 F (36.5 C)   Resp 14   Ht 5' 11.5 (1.816 m)   Wt 200 lb 9.6 oz (91 kg)   BMI 27.59 kg/m    Physical Exam Eyes:     Conjunctiva/sclera: Conjunctivae normal.  Cardiovascular:     Rate and Rhythm: Tachycardia present. Rhythm irregular.  Pulmonary:     Effort: Pulmonary effort is normal.     Breath sounds: Normal breath sounds.  Musculoskeletal:     Right lower leg: No  edema.     Left lower leg: No edema.  Lymphadenopathy:     Cervical: No cervical adenopathy.  Skin:    General: Skin is warm and dry.     Findings: Rash present.     Comments: Central facial erythema with mild overlying skin peeling Erythematous skin patches on elbow extensor surfaces, the front of knees, and on shins with minimal overlying scale   Neurological:     Mental Status: He is alert.  Psychiatric:        Mood and Affect: Mood normal.      Musculoskeletal Exam:  Right shoulder pain with overhead abduction but ROM intact, no focal tenderness to pressure Elbows full ROM no tenderness or swelling Wrists full ROM no tenderness or swelling Fingers full ROM no tenderness or swelling No paraspinal tenderness to palpation over upper and lower back Hip normal internal and external rotation without pain, no tenderness to lateral hip palpation Knees full ROM no tenderness or swelling  Investigation: No additional findings.  Imaging: ECHOCARDIOGRAM LIMITED Result Date: 08/24/2024    ECHOCARDIOGRAM LIMITED REPORT   Patient Name:   Jeffrey Quintin. Date of Exam: 08/24/2024 Medical Rec #:  [id number]            Height:       71.0 in Accession #:    [id number]           Weight:       200.0 lb Date of Birth:  07/30/1961             BSA:          2.108 m Patient Age:    63 years             BP:           103/65 mmHg Patient Gender: M                    HR:           60 bpm. Exam Location:  Inpatient Procedure: Limited Echo, Cardiac Doppler and Color Doppler (Both Spectral and            Color Flow Doppler were utilized during procedure). Indications:  I31.3 Pericardial effusion (noninflammatory)  History:        Patient has prior history of Echocardiogram examinations, most                 recent 08/09/2024. CAD and Previous Myocardial Infarction,                 Abnormal ECG, Arrythmias:Atrial Fibrillation; Risk                 Factors:Hypertension. ETOH. Pericardial effusion. Myocarditis.                  Shock.  Sonographer:    Ellouise Mose RDCS Referring Phys: [id number] ZANE ADAMS IMPRESSIONS  1. Left ventricular ejection fraction, by estimation, is 50 to 55%. The left ventricle has low normal function. The left ventricle has no regional wall motion abnormalities.  2. Right ventricular systolic function is low normal. The right ventricular size is moderately enlarged.  3. Moderate pericardial effusion. The pericardial effusion is circumferential. There is no evidence of cardiac tamponade.  4. The mitral valve is normal in structure. Trivial mitral valve regurgitation.  5. The aortic valve is tricuspid. Aortic valve regurgitation is not visualized. Aortic valve sclerosis/calcification is present, without any evidence of aortic stenosis. Comparison(s): A prior study was performed on 08/09/2024. No significant change from prior study. Prior images reviewed side by side. FINDINGS  Left Ventricle: Left ventricular ejection fraction, by estimation, is 50 to 55%. The left ventricle has low normal function. The left ventricle has no regional wall motion abnormalities. Right Ventricle: The right ventricular size is moderately enlarged. Right ventricular systolic function is low normal. Left Atrium: Left atrial size was normal in size. Right Atrium: Right atrial size was normal in size. Pericardium: A moderately sized pericardial effusion is present. The pericardial effusion is circumferential. There is no evidence of cardiac tamponade. Mitral Valve: The mitral valve is normal in structure. Trivial mitral valve regurgitation. Tricuspid Valve: The tricuspid valve is normal in structure. Tricuspid valve regurgitation is trivial. Aortic Valve: The aortic valve is tricuspid. Aortic valve regurgitation is not visualized. Aortic valve sclerosis/calcification is present, without any evidence of aortic stenosis. Pulmonic Valve: The pulmonic valve was normal in structure. Additional Comments: Spectral Doppler performed. Color  Doppler performed.  LEFT VENTRICLE PLAX 2D LVIDd:         5.37 cm LVIDs:         3.92 cm LV PW:         1.02 cm LV IVS:        1.01 cm  IVC IVC diam: 1.25 cm AORTIC VALVE LVOT Vmax:   73.70 cm/s LVOT Vmean:  45.400 cm/s LVOT VTI:    0.145 m MITRAL VALVE MV Area (PHT): 2.76 cm    SHUNTS MV Decel Time: 275 msec    Systemic VTI: 0.14 m MV E velocity: 52.70 cm/s MV A velocity: 59.60 cm/s MV E/A ratio:  0.88 Emeline Calender Electronically signed by Emeline Calender Signature Date/Time: 08/24/2024/5:31:08 PM    Final    CT Angio Chest PE W and/or Wo Contrast Result Date: 08/23/2024 CLINICAL DATA:  Pulmonary embolism suspected with high probability. EXAM: CT ANGIOGRAPHY CHEST WITH CONTRAST TECHNIQUE: Multidetector CT imaging of the chest was performed using the standard protocol during bolus administration of intravenous contrast. Multiplanar CT image reconstructions and MIPs were obtained to evaluate the vascular anatomy. RADIATION DOSE REDUCTION: This exam was performed according to the departmental dose-optimization program which includes automated exposure control, adjustment of the  mA and/or kV according to patient size and/or use of iterative reconstruction technique. CONTRAST:  65mL OMNIPAQUE  IOHEXOL  350 MG/ML SOLN COMPARISON:  Chest radiograph 08/23/2024. CT chest abdomen and pelvis 08/07/2024. Cardiac MRI 08/09/2024 the FINDINGS: Cardiovascular: Technically adequate study with good opacification of the central and segmental pulmonary arteries. No focal filling defects. No evidence of significant pulmonary embolus. Mild cardiac enlargement. Moderate pericardial effusion increasing since prior study. Normal caliber thoracic aorta. Great vessel origins are patent. No aortic dissection. Mediastinum/Nodes: Small esophageal hiatal hernia. Esophagus is decompressed. No significant lymphadenopathy. Thyroid  gland is unremarkable. Lungs/Pleura: Mild dependent atelectasis in the lung bases. No airspace disease or consolidation. No  pleural effusion or pneumothorax. Upper Abdomen: No acute abnormality. Musculoskeletal: No chest wall abnormality. No acute or significant osseous findings. Review of the MIP images confirms the above findings. IMPRESSION: 1. No evidence of significant pulmonary embolus. 2. No evidence of aortic aneurysm or dissection. 3. Cardiac enlargement. Moderate pericardial effusion is increasing since the prior study. 4. No evidence of active pulmonary disease. 5. Small esophageal hiatal hernia. Electronically Signed   By: Elsie Gravely M.D.   On: 08/23/2024 18:55   DG Chest Port 1 View Result Date: 08/23/2024 CLINICAL DATA:  Chest pain. EXAM: PORTABLE CHEST 1 VIEW COMPARISON:  August 11, 2024. FINDINGS: The heart size and mediastinal contours are within normal limits. Both lungs are clear. The visualized skeletal structures are unremarkable. IMPRESSION: No active disease. Electronically Signed   By: Lynwood Landy Raddle M.D.   On: 08/23/2024 18:14   DG Chest Port 1 View Result Date: 08/11/2024 EXAM: 1 VIEW(S) XRAY OF THE CHEST 08/11/2024 07:34:00 AM COMPARISON: 08/09/2024 CLINICAL HISTORY: Hypoxia FINDINGS: LUNGS AND PLEURA: Small left pleural effusion. Increased diffuse interstitial markings with increased left retrocardiac opacity. No pulmonary edema. No pneumothorax. HEART AND MEDIASTINUM: Cardiomegaly. BONES AND SOFT TISSUES: No acute osseous abnormality. IMPRESSION: 1. Increased left retrocardiac opacity, which may reflect atelectasis or developing consolidation. 2. Diffuse increased interstitial markings concerning for pulmonary edema. 3. Small left pleural effusion. 4. Cardiomegaly. Electronically signed by: Waddell Calk MD 08/11/2024 08:16 AM EDT RP Workstation: [id number]   CARDIAC CATHETERIZATION Result Date: 08/10/2024 Table formatting from the original result was not included. Images from the original result were not included.   Likely culprit lesion: Dist RCA lesion is 65% stenosed (focal ulcerated  plaque) with 10% stenosed RPA V side branch in RPAV.   A drug-eluting stent was successfully placed from a branch into the RPA V sidebranch (across the RPDA) using a STENT SYNERGY XD [id number] in the main branch and side branch.  Stent was deployed at 3.8 mm and postdilated to 4.1 mm in the main branch.   Post intervention, there is a 0% residual stenosis. Post intervention, the side branch was reduced to 0% residual stenosis.  TIMI-3 flow restored.   --------------------------------------------------------------------------- -----   Mid LAD lesion is 20% stenosed with 20% stenosed side branch in 2nd Diag.   --------------------------------------------------------------------------- -----   LV end diastolic pressure is severely elevated.   There is no aortic valve stenosis. Diagnostic: Dominance: Right                                                          Intervention  Likely culprit lesion for MI (with inferior MI seen on MRI) is a ulcerated  plaque (65%) in the mid RCA just proximal to a high bifurcation into PDA and PAV.   Otherwise the rest of the RCA with several PL branches and PDA are free of disease. Successful DES PCI of 65% ulcerated plaque mid RCA stenting from RCA into RPA V with 3.5 mm x 20 mm Synergy XD deployed 3.8 mm, and postdilated proximally to 4.1 mm upstream of the bifurcation.  The stent was fully expanded however there still remains a small ledge of the plaque outside of the stent but notable improvement of flow. = Lesion essentially reduced to 0%> LCA: Other than 20% mid LAD at D1 takeoff, angiographically minimal disease.  1 small 1 moderate caliber diagonal branch with extensive bifurcation.  LCx terminates as an OM1-OM2 with an AV groove branch and small posterolateral branch. Severely  elevated LVEDP of roughly 30 mmHg consistent with Acute Diastolic Heart Failure Treated with 40 mg IV Lasix  in the Cath Lab.  RECOMMENDATIONS   The patient was loaded with ticagrelor /Brilinta  in the Cath  Lab, but it became known that he would likely require DOAC and therefore the plan is to reload with Plavix /clopidogrel  the morning of 08/11/2024 and then continue Plavix  75 mg daily from then on   Recommend to resume undecided DOAC, at currently prescribed dose and frequency on 08/11/2024.   Recommend concurrent antiplatelet therapy of Aspirin  81 mg for 1 month and Clopidogrel  75mg  daily for 6 months  If there is increased bleeding risk concern, could consider shortening aspirin  to 2 weeks .   Due to significant elevated LVEDP, he was given 40 mg IV Lasix  in the Cath Lab, would reassess volume status in the morning and strongly consider additional diuresis. PLAN OF CARE: Continue ongoing care for URI and likely myocarditis.  I suspect that the MRI and RCA lesion were incidental findings.  I have written to convert from Brilinta  to clopidogrel  (loading dose 300 mg morning 08/11/2024 and then 25 mg daily after that, provided that the plan is to place the patient on a DOAC.  With elevated EDP of roughly 30 mmHg, would likely consider additional diuresis (he was given 40 mg IV Lasix  in the Cath Lab and would assess in the morning.  Once stable from a cardiovascular standpoint, can begin to titrate GDMT.  Alm Clay  MR CARDIAC MORPHOLOGY W WO CONTRAST Result Date: 08/09/2024 CLINICAL DATA:  22M p/w shock, chest pain, troponin elevated to 2114. Echo with EF 50-55%, moderate pericardial effusion. EXAM: CARDIAC MRI TECHNIQUE: The patient was scanned on a 1.5 Tesla Siemens magnet. A dedicated cardiac coil was used. Functional imaging was done using [place] sequences. 2,3, and 4 chamber views were done to assess for RWMA's. Modified Simpson's rule using a short axis stack was used to calculate an ejection fraction on a dedicated work [employer]. The patient received 10 cc of Gadavist . After 10 minutes inversion recovery sequences were used to assess for infiltration and scar tissue. Phase contrast  velocity mapping was performed CONTRAST:  10 cc  of Gadavist  FINDINGS: Left ventricle: -Cine images were not interpretable due to artifact from cardiac/respiratory motion. Unable to calculate LV volumes and EF -Subendocardial LGE in apical inferior wall consistent with small infarct -Elevated myocardial T1/T2/ECV values consistent with myocardial edema. -RV insertion site LGE, which can be seen in setting of elevated pulmonary pressures Right ventricle: Cine images were not interpretable due to artifact from cardiac/respiratory motion. Unable to calculate RV volumes and EF Left atrium: Mild enlargement Right atrium:  Mild enlargement Valves: Unable to assess due to artifact Aorta: Dilated ascending aorta measuring 40mm Pulmonary artery: Dilated main PA measuring 34mm Pericardium: Moderate pericardial effusion measuring up to 1.5cm adjacent to LV inferior wall. NO pericardial LGE IMPRESSION: 1. Cine images were not interpretable due to artifact from cardiac/respiratory motion. Unable to calculate LV/RV volumes and EF 2. Subendocardial LGE in apical inferior wall consistent with small infarct 3. Elevated myocardial T1/T2/ECV values consistent with myocardial edema 4. Moderate pericardial effusion measuring up to 1.5cm adjacent to LV inferior wall 5.  Dilated ascending aorta measuring 40mm 6. Dilated main pulmonary artery measuring 34mm. RV insertion site LGE, which can be seen in setting of elevated pulmonary pressures 7. Clinical presentation suggests myocarditis, and meets criteria for acute myocarditis on CMR with regional elevation in T1/T2/ECV levels. However, given LGE imaging shows evidence of small infarct in apical inferior wall, would be important to clarify if this is an acute or old infarct. Recommend cardiac catheterization. Given elevated T1/T2 levels are not just located around the infarct, but in multiple regions throughout the LV, suspect more likely myocarditis but important to rule out acute infarct  Electronically Signed   By: Lonni Nanas M.D.   On: 08/09/2024 22:43   ECHOCARDIOGRAM LIMITED Result Date: 08/09/2024    ECHOCARDIOGRAM LIMITED REPORT   Patient Name:   Jeffrey Donahoe. Date of Exam: 08/09/2024 Medical Rec #:  [id number]            Height:       71.5 in Accession #:    [id number]           Weight:       210.3 lb Date of Birth:  Nov 04, 1961             BSA:          2.165 m Patient Age:    63 years             BP:           93/70 mmHg Patient Gender: M                    HR:           84 bpm. Exam Location:  Inpatient Procedure: Limited Echo and Cardiac Doppler (Both Spectral and Color Flow            Doppler were utilized during procedure). Indications:    Pericardial Effusion I31.3  History:        Patient has prior history of Echocardiogram examinations, most                 recent 08/08/2024.  Sonographer:    Tinnie Gosling RDCS Referring Phys: [phone] TRACI R TURNER IMPRESSIONS  1. Left ventricular ejection fraction, by estimation, is 50 to 55%. The left ventricle has low normal function. There is mild concentric left ventricular hypertrophy.  2. Right ventricular systolic function is mildly reduced. Tricuspid regurgitation signal is inadequate for assessing PA pressure.  3. Moderate pericardial effusion. The pericardial effusion is circumferential. There is no evidence of cardiac tamponade.  4. The inferior vena cava is dilated in size with <50% respiratory variability, suggesting right atrial pressure of 20 mmHg. Comparison(s): No significant change from prior study. Prior images reviewed side by side. FINDINGS  Left Ventricle: Left ventricular ejection fraction, by estimation, is 50 to 55%. The left ventricle has low normal function. The left ventricular internal cavity size was normal in size. There is  mild concentric left ventricular hypertrophy. Right Ventricle: Right ventricular systolic function is mildly reduced. Tricuspid regurgitation signal is inadequate for assessing PA  pressure. Pericardium: A moderately sized pericardial effusion is present. The pericardial effusion is circumferential. There is no evidence of cardiac tamponade. Venous: The inferior vena cava is dilated in size with less than 50% respiratory variability, suggesting right atrial pressure of 15 mmHg. LEFT VENTRICLE PLAX 2D LVIDd:         4.60 cm LVIDs:         2.80 cm LV PW:         1.20 cm LV IVS:        1.20 cm  IVC IVC diam: 2.90 cm Stanly Leavens MD Electronically signed by Stanly Leavens MD Signature Date/Time: 08/09/2024/11:43:30 AM    Final    DG Chest Port 1 View Result Date: 08/09/2024 CLINICAL DATA:  [id number] Wheezing [id number]. EXAM: PORTABLE CHEST 1 VIEW COMPARISON:  08/08/2024. FINDINGS: Mild coarse interstitial markings are grossly similar to the prior study. Bilateral lung fields are clear. No acute consolidation, lung collapse or pulmonary edema. Bilateral costophrenic angles are clear. Redemonstration of elevated right hemidiaphragm. Stable cardio-mediastinal silhouette. No acute osseous abnormalities. The soft tissues are within normal limits. IMPRESSION: No active disease. Electronically Signed   By: Ree Molt M.D.   On: 08/09/2024 08:15    Recent Labs: Lab Results  Component Value Date   WBC 7.4 08/24/2024   HGB 12.1 (L) 08/24/2024   PLT 260 08/24/2024   NA 132 (L) 08/24/2024   K 4.2 08/24/2024   CL 97 (L) 08/24/2024   CO2 25 08/24/2024   GLUCOSE 116 (H) 08/24/2024   BUN 21 08/24/2024   CREATININE 1.23 08/24/2024   BILITOT 0.9 08/24/2024   ALKPHOS 49 08/24/2024   AST 15 08/24/2024   ALT 17 08/24/2024   PROT 6.0 (L) 08/24/2024   ALBUMIN  3.1 (L) 08/24/2024   CALCIUM  8.7 (L) 08/24/2024   GFRAA >60 06/02/2015   QFTBGOLDPLUS NEGATIVE 11/26/2023    Speciality Comments: Cosentyx  started 01/12/2024  Procedures:  No procedures performed Allergies: Patient has no known allergies.   Assessment / Plan:     Visit Diagnoses: Psoriatic arthritis (HCC) - Plan:  Secukinumab  (COSENTYX  UNOREADY) 300 MG/2ML SOAJ Psoriatic arthritis and psoriasis improved with Cosentyx . Flare-up after discontinuation due to infection and procedure. Cosentyx  not related to infection. - Resume Cosentyx  300 mg [place] monthly  Psoriasis - Plan: Secukinumab  (COSENTYX  UNOREADY) 300 MG/2ML SOAJ Skin disease worsening a bit after stopping treatment, mild so far. Central facial symptoms very bothersome.  High risk medication use - Cosentyx  300 mg [place] monthly - Plan: Secukinumab  (COSENTYX  UNOREADY) 300 MG/2ML SOAJ Was tolerating injections without significant side effect reported.  He has had interval labs checked due to unfortunately multiple hospitalizations.  Atrial fibrillation with rapid ventricular response Atrial fibrillation with rapid ventricular response likely due to systemic inflammation and hypotension. 65% blockage in right circumflex artery noted. Heart rate controlled, atrial fibrillation persists.   Orders: No orders of the defined types were placed in this encounter.  Meds ordered this encounter  Medications   Secukinumab  (COSENTYX  UNOREADY) 300 MG/2ML SOAJ    Sig: Inject 300 mg into the skin every 28 (twenty-eight) days.    Dispense:  2 mL    Refill:  2     Follow-Up Instructions: Return in about 3 months (around 12/08/2024) for PsA/PsO COS resume f/u 3mos.   Lonni LELON Ester, MD  Note - This record has been created  using Animal nutritionist.  Chart creation errors have been sought, but may not always  have been located. Such creation errors do not reflect on  the standard of medical care.

## 2024-08-24 NOTE — ED Notes (Signed)
 Pt BG 74. Patient encouraged to eat breakfast. Patient ate french toast off of tray.

## 2024-08-24 NOTE — Plan of Care (Signed)

## 2024-08-24 NOTE — ED Notes (Signed)
 Report given to [facility]

## 2024-08-24 NOTE — ED Notes (Signed)
Report given to IP nurse.  

## 2024-08-25 ENCOUNTER — Inpatient Hospital Stay: Admitting: Family Medicine

## 2024-08-25 DIAGNOSIS — I959 Hypotension, unspecified: Secondary | ICD-10-CM

## 2024-08-25 DIAGNOSIS — E1165 Type 2 diabetes mellitus with hyperglycemia: Secondary | ICD-10-CM | POA: Diagnosis not present

## 2024-08-25 DIAGNOSIS — I2510 Atherosclerotic heart disease of native coronary artery without angina pectoris: Secondary | ICD-10-CM | POA: Diagnosis not present

## 2024-08-25 DIAGNOSIS — N179 Acute kidney failure, unspecified: Secondary | ICD-10-CM | POA: Diagnosis not present

## 2024-08-25 DIAGNOSIS — I309 Acute pericarditis, unspecified: Secondary | ICD-10-CM | POA: Diagnosis not present

## 2024-08-25 DIAGNOSIS — I952 Hypotension due to drugs: Secondary | ICD-10-CM | POA: Diagnosis not present

## 2024-08-25 LAB — GLUCOSE, CAPILLARY
Glucose-Capillary: 82 mg/dL (ref 70–99)
Glucose-Capillary: 96 mg/dL (ref 70–99)
Glucose-Capillary: 119 mg/dL — ABNORMAL HIGH (ref 70–99)
Glucose-Capillary: 127 mg/dL — ABNORMAL HIGH (ref 70–99)

## 2024-08-25 NOTE — Progress Notes (Signed)
 Transition of Care [facility]) - Inpatient Brief Assessment   Patient Details  Name: Slayton Lubitz. MRN: [id number] Date of Birth: [birth year]  Transition of Care [facility] CM/SW Contact:    Rosaline JONELLE Joe, RN Phone Number: 08/25/2024, 10:17 AM   Clinical Narrative: Patient admitted to the hospital for Hypotension.  Patient lives alone and is active with [facility].  I called and spoke with the patient's daughter, Kristin by phone and updated her that [facility] will call her to set up services when patient discharges to home.  Kristin, daughter requested resources for Medicaid transportation to home.  Resources were emailed to the daughter to [email or web address].  Daughter states that when patient is stable that the family will provide transportation to home.   Transition of Care Asessment: Insurance and Status: (P) Insurance coverage has been reviewed Patient has primary care physician: (P) Yes Home environment has been reviewed: (P) from home alone Prior level of function:: (P) self Prior/Current Home Services: (P) Current home services (Patient set up with [facility] for PT,OT) Social Drivers of Health Review: (P) SDOH reviewed interventions complete Readmission risk has been reviewed: (P) Yes Transition of care needs: (P) transition of care needs identified, TOC will continue to follow

## 2024-08-25 NOTE — Plan of Care (Signed)

## 2024-08-25 NOTE — Progress Notes (Signed)
  Progress Note  Patient Name: Jeffrey Black. Date of Encounter: 08/25/2024 [facility] Cardiologist: Wilbert Bihari, MD   Interval Summary    Feels well this morning, no complaints.  Vital Signs Vitals:   08/24/24 2100 08/24/24 2300 08/25/24 0500 08/25/24 0755  BP: 98/64 114/73 110/68 114/74  Pulse: 63 (!) 56 (!) 55 (!) 57  Resp: 18 18 16 18   Temp: 98.3 F (36.8 C) 98 F (36.7 C) 98.1 F (36.7 C) 98.4 F (36.9 C)  TempSrc: Oral Oral Oral Oral  SpO2: 98% 98% 98% 98%  Weight:      Height:        Intake/Output Summary (Last 24 hours) at 08/25/2024 1030 Last data filed at 08/24/2024 2138 Gross per 24 hour  Intake 2147.72 ml  Output --  Net 2147.72 ml      08/23/2024    7:50 PM 08/23/2024    3:14 PM 08/14/2024    3:41 AM  Last 3 Weights  Weight (lbs) 199 lb 15.3 oz 200 lb 200 lb 9.6 oz  Weight (kg) 90.7 kg 90.719 kg 90.992 kg      Telemetry/ECG   Sinus bradycardia in the 50s- Personally Reviewed  Physical Exam  GEN: No acute distress.   Neck: No JVD Cardiac: RRR, no murmurs, rubs, or gallops.  Respiratory: Clear to auscultation bilaterally. GI: Soft, nontender, non-distended  MS: No edema  Assessment & Plan   63 y.o. male with a hx of type 2 diabetes, alcohol  abuse, paroxysmal atrial fibrillation, diastolic heart failure, myocarditis, CAD status post PCI, psoriatic arthritis, and pericardial effusion who was seen 08/24/2024 for the evaluation of hypotension and pericardial effusion at the request of Sabas Brod MD.   Pericardial effusion Myocarditis Hypotension -- Recent hospitalization for chest pain where he was found to have small to moderate pericardial effusion as well as MRI indicative of myocarditis -- Was treated with colchicine  and set up for outpatient follow-up -- Found to be hypotensive with BP 70/40 on admission, suspect this is in the setting of polypharmacy -- Echo 10/8 with LVEF of 50 to 55%, no regional wall motion abnormality, low  normal RV function, moderate pericardial effusion with no evidence of cardiac tamponade -- All BP meds stopped with improvement in his blood pressure.  He will follow his blood pressures as an outpatient, may consider adding back losartan  outpatient if needed. -- Continue colchicine  0.6 mg twice daily  CAD -- Recent PCI to RCA -- Aspirin  stopped, continue Plavix  along with Eliquis  given history of proximal atrial fibrillation -- Continue statin  Will arrange follow-up in the office  For questions or updates, please contact [facility] Please consult [email or web address] for contact info under   Signed, Manuelita Rummer, NP

## 2024-08-25 NOTE — Progress Notes (Signed)
 [facility]  PROGRESS NOTE  Jeffrey Black. FMW:[id number] DOB: May 26, 1961 DOA: 08/23/2024 PCP: [name], MD   Brief HPI:    63 y.o. male with medical history significant of diabetes, alcohol  abuse, history of bronchitis, essential hypertension, psoriatic arthritis, history of polysubstance abuse previously, depression with anxiety, who was recently admitted with chest pain and syncope on September 21.  Patient had pneumomediastinum and shock.  He was in the ICU.  He developed paroxysmal atrial fibrillation with pericardial effusions.  Patient had coronary artery disease and had PCI done developed myocarditis as well as has history of diastolic congestive heart failure.  Patient was hypotensive during recent hospitalization and was on Levophed  in the ICU.  At that time he ruled in with troponin as high as 1350.  Cardiology was consulted and patient was deemed to have noncardiac etiology of his rising troponin.  He had a left heart cath that showed distal RCA lesion up to 65%.  She has PCI performed and discharged on DAPT.  Patient went to his cardiology appointment today but was found to be hypotensive.  This was similar to his previous presentation.  His initial blood pressure was 70/40.  He was diaphoretic pale and near syncopal.  He has been progressively feeling bad since last week when he left the hospital.  He has been weak.  Patient sent to the ER for further evaluation and treatment.  In the ER him continues to have low blood pressure despite fluids resuscitation.  Systolic has been in the 90s now.  He is pericardial effusion was noted after and showed bedside echo.  No significant change from previous.  No evidence of bleed.  He is fully awake alert and communicative.  Cardiology consulted and we admitted the patient to the hospital for evaluation of persistent hypotension.     Assessment/Plan:   #1 persistent hypotension: Patient does not appear to be having any sepsis or  septic shock.  Empirically started on antibiotics.  Hypotension is likely medication induced.    Cultures have been obtained and will monitor closely.  IV antibiotics were started and are currently discontinued.  Cortisol level is 7.5 .  Cardiology consulted, blood pressure medications are currently on hold.  Stopped olmesartan , metoprolol , Lasix .  Olmesartan  can be added as outpatient if needed.   #2 pericardial effusion: Moderate at this point.  Defer to cardiology.  Repeat echocardiogram again showed moderate pericardial effusion, no change from previous.     #3 type 2 diabetes: Continue sliding scale insulin  with NovoLog .  CBG well-controlled   #4 recent myocarditis: Recovering.  Cardiology recommends to continue colchicine  0.6 mg p.o. twice daily for 3 months   #5 AKI: Most likely prerenal.  Resolved with IV hydration.   #6 depression: Continue antidepressants.    Medications     apixaban   5 mg Oral BID   clopidogrel   75 mg Oral Daily   colchicine   0.6 mg Oral BID   insulin  aspart  0-15 Units Subcutaneous TID WC   insulin  aspart  0-5 Units Subcutaneous QHS   rosuvastatin   20 mg Oral Daily     Data Reviewed:   CBG:  Recent Labs  Lab 08/23/24 2317 08/24/24 0753 08/24/24 1125 08/24/24 1640 08/24/24 2136  GLUCAP 85 74 82 152* 94    SpO2: 98 %    Vitals:   08/24/24 2100 08/24/24 2300 08/25/24 0500 08/25/24 0755  BP: 98/64 114/73 110/68 114/74  Pulse: 63 (!) 56 (!) 55 (!) 57  Resp: 18 18 16 18   Temp: 98.3 F (36.8 C) 98 F (36.7 C) 98.1 F (36.7 C) 98.4 F (36.9 C)  TempSrc: Oral Oral Oral Oral  SpO2: 98% 98% 98% 98%  Weight:      Height:          Data Reviewed:  Basic Metabolic Panel: Recent Labs  Lab 08/23/24 1731 08/23/24 1742 08/24/24 0348  NA 133* 134* 132*  K 4.4 4.4 4.2  CL 96* 98 97*  CO2 25  --  25  GLUCOSE 80 77 116*  BUN 28* 29* 21  CREATININE 1.66* 1.70* 1.23  CALCIUM  9.3  --  8.7*    CBC: Recent Labs  Lab 08/23/24 1731  08/23/24 1742 08/24/24 0348  WBC 8.7  --  7.4  NEUTROABS 4.7  --   --   HGB 12.9* 13.6 12.1*  HCT 40.4 40.0 37.4*  MCV 88.8  --  88.0  PLT 300  --  260    LFT Recent Labs  Lab 08/23/24 1731 08/24/24 0348  AST 17 15  ALT 19 17  ALKPHOS 53 49  BILITOT 0.7 0.9  PROT 6.7 6.0*  ALBUMIN  3.5 3.1*     Antibiotics: Anti-infectives (From admission, onward)    Start     Dose/Rate Route Frequency Ordered Stop   08/23/24 1815  ceFEPIme (MAXIPIME) 2 g in sodium chloride  0.9 % 100 mL IVPB        2 g 200 mL/hr over 30 Minutes Intravenous  Once 08/23/24 1807 08/23/24 1904   08/23/24 1800  vancomycin  (VANCOCIN ) IVPB 1000 mg/200 mL premix        1,000 mg 200 mL/hr over 60 Minutes Intravenous  Once 08/23/24 1750 08/23/24 2300   08/23/24 1800  metroNIDAZOLE (FLAGYL) IVPB 500 mg        500 mg 100 mL/hr over 60 Minutes Intravenous  Once 08/23/24 1750 08/23/24 2155        DVT prophylaxis: Apixaban   Code Status: Full code  Family Communication:    CONSULTS    Subjective   Blood pressure has improved after stopping antihypertensive medications.   Objective    Physical Examination:  General-appears in no acute distress Heart-S1-S2, regular, no murmur auscultated Lungs-clear to auscultation bilaterally, no wheezing or crackles auscultated Abdomen-soft, nontender, no organomegaly Extremities-no edema in the lower extremities Neuro-alert, oriented x3, no focal deficit noted  Status is: Inpatient:             [name]   [facility] If 7PM-7AM, please contact night-coverage at [email or web address], Office  [phone]   08/25/2024, 8:04 AM  LOS: 1 day

## 2024-08-26 ENCOUNTER — Telehealth: Payer: Self-pay

## 2024-08-26 DIAGNOSIS — E785 Hyperlipidemia, unspecified: Secondary | ICD-10-CM | POA: Diagnosis present

## 2024-08-26 DIAGNOSIS — N179 Acute kidney failure, unspecified: Secondary | ICD-10-CM | POA: Diagnosis not present

## 2024-08-26 DIAGNOSIS — F1011 Alcohol abuse, in remission: Secondary | ICD-10-CM | POA: Diagnosis present

## 2024-08-26 DIAGNOSIS — Z8249 Family history of ischemic heart disease and other diseases of the circulatory system: Secondary | ICD-10-CM | POA: Diagnosis not present

## 2024-08-26 DIAGNOSIS — E872 Acidosis, unspecified: Secondary | ICD-10-CM | POA: Diagnosis present

## 2024-08-26 DIAGNOSIS — E1165 Type 2 diabetes mellitus with hyperglycemia: Secondary | ICD-10-CM | POA: Diagnosis present

## 2024-08-26 DIAGNOSIS — F17210 Nicotine dependence, cigarettes, uncomplicated: Secondary | ICD-10-CM | POA: Diagnosis present

## 2024-08-26 DIAGNOSIS — I3139 Other pericardial effusion (noninflammatory): Secondary | ICD-10-CM

## 2024-08-26 DIAGNOSIS — I5032 Chronic diastolic (congestive) heart failure: Secondary | ICD-10-CM | POA: Diagnosis present

## 2024-08-26 DIAGNOSIS — Z79899 Other long term (current) drug therapy: Secondary | ICD-10-CM | POA: Diagnosis not present

## 2024-08-26 DIAGNOSIS — I2510 Atherosclerotic heart disease of native coronary artery without angina pectoris: Secondary | ICD-10-CM | POA: Diagnosis not present

## 2024-08-26 DIAGNOSIS — F32A Depression, unspecified: Secondary | ICD-10-CM | POA: Diagnosis present

## 2024-08-26 DIAGNOSIS — I110 Hypertensive heart disease with heart failure: Secondary | ICD-10-CM | POA: Diagnosis present

## 2024-08-26 DIAGNOSIS — Z7901 Long term (current) use of anticoagulants: Secondary | ICD-10-CM | POA: Diagnosis not present

## 2024-08-26 DIAGNOSIS — T464X5A Adverse effect of angiotensin-converting-enzyme inhibitors, initial encounter: Secondary | ICD-10-CM | POA: Diagnosis present

## 2024-08-26 DIAGNOSIS — Z7902 Long term (current) use of antithrombotics/antiplatelets: Secondary | ICD-10-CM | POA: Diagnosis not present

## 2024-08-26 DIAGNOSIS — I952 Hypotension due to drugs: Secondary | ICD-10-CM | POA: Diagnosis not present

## 2024-08-26 DIAGNOSIS — I959 Hypotension, unspecified: Secondary | ICD-10-CM | POA: Diagnosis present

## 2024-08-26 DIAGNOSIS — Z9861 Coronary angioplasty status: Secondary | ICD-10-CM | POA: Diagnosis not present

## 2024-08-26 DIAGNOSIS — Z7982 Long term (current) use of aspirin: Secondary | ICD-10-CM | POA: Diagnosis not present

## 2024-08-26 DIAGNOSIS — L4050 Arthropathic psoriasis, unspecified: Secondary | ICD-10-CM | POA: Diagnosis present

## 2024-08-26 DIAGNOSIS — Z7984 Long term (current) use of oral hypoglycemic drugs: Secondary | ICD-10-CM | POA: Diagnosis not present

## 2024-08-26 DIAGNOSIS — I409 Acute myocarditis, unspecified: Secondary | ICD-10-CM | POA: Diagnosis present

## 2024-08-26 DIAGNOSIS — I480 Paroxysmal atrial fibrillation: Secondary | ICD-10-CM | POA: Diagnosis present

## 2024-08-26 LAB — GLUCOSE, CAPILLARY
Glucose-Capillary: 132 mg/dL — ABNORMAL HIGH (ref 70–99)
Glucose-Capillary: 94 mg/dL (ref 70–99)

## 2024-08-26 NOTE — Progress Notes (Incomplete)
 [facility]  PROGRESS NOTE  Jeffrey Black. FMW:[id number] DOB: [birth year] DOA: 08/23/2024 PCP: [name], MD   Brief HPI:    63 y.o. male with medical history significant of diabetes, alcohol  abuse, history of bronchitis, essential hypertension, psoriatic arthritis, history of polysubstance abuse previously, depression with anxiety, who was recently admitted with chest pain and syncope on September 21.  Patient had pneumomediastinum and shock.  He was in the ICU.  He developed paroxysmal atrial fibrillation with pericardial effusions.  Patient had coronary artery disease and had PCI done developed myocarditis as well as has history of diastolic congestive heart failure.  Patient was hypotensive during recent hospitalization and was on Levophed  in the ICU.  At that time he ruled in with troponin as high as 1350.  Cardiology was consulted and patient was deemed to have noncardiac etiology of his rising troponin.  He had a left heart cath that showed distal RCA lesion up to 65%.  She has PCI performed and discharged on DAPT.  Patient went to his cardiology appointment today but was found to be hypotensive.  This was similar to his previous presentation.  His initial blood pressure was 70/40.  He was diaphoretic pale and near syncopal.  He has been progressively feeling bad since last week when he left the hospital.  He has been weak.  Patient sent to the ER for further evaluation and treatment.  In the ER him continues to have low blood pressure despite fluids resuscitation.  Systolic has been in the 90s now.  He is pericardial effusion was noted after and showed bedside echo.  No significant change from previous.  No evidence of bleed.  He is fully awake alert and communicative.  Cardiology consulted and we admitted the patient to the hospital for evaluation of persistent hypotension.     Assessment/Plan:   #1 persistent hypotension: Patient does not appear to be having any sepsis or  septic shock.  Empirically started on antibiotics.  Hypotension is likely medication induced.    Cultures have been obtained and will monitor closely.  IV antibiotics were started and are currently discontinued.  Cortisol level is 7.5 .  Cardiology consulted, blood pressure medications are currently on hold.  Stopped olmesartan , metoprolol , Lasix .  Olmesartan  can be added as outpatient if needed.   #2 pericardial effusion: Moderate at this point.  Defer to cardiology.  Repeat echocardiogram again showed moderate pericardial effusion, no change from previous.     #3 type 2 diabetes: Continue sliding scale insulin  with NovoLog .  CBG well-controlled   #4 recent myocarditis: Recovering.  Cardiology recommends to continue colchicine  0.6 mg p.o. twice daily for 3 months   #5 AKI: Most likely prerenal.  Resolved with IV hydration.   #6 depression: Continue antidepressants.    Medications     apixaban   5 mg Oral BID   clopidogrel   75 mg Oral Daily   colchicine   0.6 mg Oral BID   insulin  aspart  0-15 Units Subcutaneous TID WC   insulin  aspart  0-5 Units Subcutaneous QHS   rosuvastatin   20 mg Oral Daily     Data Reviewed:   CBG:  Recent Labs  Lab 08/25/24 0832 08/25/24 1157 08/25/24 1506 08/25/24 2006 08/26/24 0739  GLUCAP 82 96 119* 127* 132*    SpO2: 96 %    Vitals:   08/25/24 2004 08/26/24 0013 08/26/24 0407 08/26/24 0738  BP: 110/75 104/76 107/71 101/67  Pulse: 60 73 (!) 53 66  Resp: 18  18 16   Temp: 98.4 F (36.9 C) 98 F (36.7 C) 97.7 F (36.5 C) 98.6 F (37 C)  TempSrc: Oral Oral Oral   SpO2: 98% 99% 98% 96%  Weight:      Height:          Data Reviewed:  Basic Metabolic Panel: Recent Labs  Lab 08/23/24 1731 08/23/24 1742 08/24/24 0348  NA 133* 134* 132*  K 4.4 4.4 4.2  CL 96* 98 97*  CO2 25  --  25  GLUCOSE 80 77 116*  BUN 28* 29* 21  CREATININE 1.66* 1.70* 1.23  CALCIUM  9.3  --  8.7*    CBC: Recent Labs  Lab 08/23/24 1731 08/23/24 1742  08/24/24 0348  WBC 8.7  --  7.4  NEUTROABS 4.7  --   --   HGB 12.9* 13.6 12.1*  HCT 40.4 40.0 37.4*  MCV 88.8  --  88.0  PLT 300  --  260    LFT Recent Labs  Lab 08/23/24 1731 08/24/24 0348  AST 17 15  ALT 19 17  ALKPHOS 53 49  BILITOT 0.7 0.9  PROT 6.7 6.0*  ALBUMIN  3.5 3.1*     Antibiotics: Anti-infectives (From admission, onward)    Start     Dose/Rate Route Frequency Ordered Stop   08/23/24 1815  ceFEPIme (MAXIPIME) 2 g in sodium chloride  0.9 % 100 mL IVPB        2 g 200 mL/hr over 30 Minutes Intravenous  Once 08/23/24 1807 08/23/24 1904   08/23/24 1800  vancomycin  (VANCOCIN ) IVPB 1000 mg/200 mL premix        1,000 mg 200 mL/hr over 60 Minutes Intravenous  Once 08/23/24 1750 08/23/24 2300   08/23/24 1800  metroNIDAZOLE (FLAGYL) IVPB 500 mg        500 mg 100 mL/hr over 60 Minutes Intravenous  Once 08/23/24 1750 08/23/24 2155        DVT prophylaxis: Apixaban   Code Status: Full code  Family Communication:    CONSULTS    Subjective      Objective    Physical Examination:    Status is: Inpatient:             Jeffrey Black   [facility] If 7PM-7AM, please contact night-coverage at [email or web address], Office  [phone]   08/26/2024, 7:52 AM  LOS: 1 day

## 2024-08-26 NOTE — Progress Notes (Incomplete)
 [facility]  PROGRESS NOTE  [name] FMW:[id number] DOB: 1961/09/17 DOA: 08/23/2024 PCP: [name], MD   Brief HPI:    63 y.o. male with medical history significant of diabetes, alcohol  abuse, history of bronchitis, essential hypertension, psoriatic arthritis, history of polysubstance abuse previously, depression with anxiety, who was recently admitted with chest pain and syncope on September 21.  Patient had pneumomediastinum and shock.  He was in the ICU.  He developed paroxysmal atrial fibrillation with pericardial effusions.  Patient had coronary artery disease and had PCI done developed myocarditis as well as has history of diastolic congestive heart failure.  Patient was hypotensive during recent hospitalization and was on Levophed  in the ICU.  At that time he ruled in with troponin as high as 1350.  Cardiology was consulted and patient was deemed to have noncardiac etiology of his rising troponin.  He had a left heart cath that showed distal RCA lesion up to 65%.  She has PCI performed and discharged on DAPT.  Patient went to his cardiology appointment today but was found to be hypotensive.  This was similar to his previous presentation.  His initial blood pressure was 70/40.  He was diaphoretic pale and near syncopal.  He has been progressively feeling bad since last week when he left the hospital.  He has been weak.  Patient sent to the ER for further evaluation and treatment.  In the ER him continues to have low blood pressure despite fluids resuscitation.  Systolic has been in the 90s now.  He is pericardial effusion was noted after and showed bedside echo.  No significant change from previous.  No evidence of bleed.  He is fully awake alert and communicative.  Cardiology consulted and we admitted the patient to the hospital for evaluation of persistent hypotension.     Assessment/Plan:   #1 persistent hypotension: Patient does not appear to be having any sepsis or  septic shock.  Empirically started on antibiotics.  Hypotension is likely medication induced.    Cultures have been obtained and will monitor closely.  IV antibiotics were started and are currently discontinued.  Cortisol level is 7.5 .  Cardiology consulted, blood pressure medications are currently on hold.  Stopped olmesartan , metoprolol , Lasix .  Olmesartan  can be added as outpatient if needed.   #2 pericardial effusion: Moderate at this point.  Defer to cardiology.  Repeat echocardiogram again showed moderate pericardial effusion, no change from previous.     #3 type 2 diabetes: Continue sliding scale insulin  with NovoLog .  CBG well-controlled   #4 recent myocarditis: Recovering.  Cardiology recommends to continue colchicine  0.6 mg p.o. twice daily for 3 months   #5 AKI: Most likely prerenal.  Resolved with IV hydration.   #6 depression: Continue antidepressants.    Medications     apixaban   5 mg Oral BID   clopidogrel   75 mg Oral Daily   colchicine   0.6 mg Oral BID   insulin  aspart  0-15 Units Subcutaneous TID WC   insulin  aspart  0-5 Units Subcutaneous QHS   rosuvastatin   20 mg Oral Daily     Data Reviewed:   CBG:  Recent Labs  Lab 08/25/24 0832 08/25/24 1157 08/25/24 1506 08/25/24 2006 08/26/24 0739  GLUCAP 82 96 119* 127* 132*    SpO2: 96 %    Vitals:   08/25/24 2004 08/26/24 0013 08/26/24 0407 08/26/24 0738  BP: 110/75 104/76 107/71 101/67  Pulse: 60 73 (!) 53 66  Resp: 18  18 16   Temp: 98.4 F (36.9 C) 98 F (36.7 C) 97.7 F (36.5 C) 98.6 F (37 C)  TempSrc: Oral Oral Oral   SpO2: 98% 99% 98% 96%  Weight:      Height:          Data Reviewed:  Basic Metabolic Panel: Recent Labs  Lab 08/23/24 1731 08/23/24 1742 08/24/24 0348  NA 133* 134* 132*  K 4.4 4.4 4.2  CL 96* 98 97*  CO2 25  --  25  GLUCOSE 80 77 116*  BUN 28* 29* 21  CREATININE 1.66* 1.70* 1.23  CALCIUM  9.3  --  8.7*    CBC: Recent Labs  Lab 08/23/24 1731 08/23/24 1742  08/24/24 0348  WBC 8.7  --  7.4  NEUTROABS 4.7  --   --   HGB 12.9* 13.6 12.1*  HCT 40.4 40.0 37.4*  MCV 88.8  --  88.0  PLT 300  --  260    LFT Recent Labs  Lab 08/23/24 1731 08/24/24 0348  AST 17 15  ALT 19 17  ALKPHOS 53 49  BILITOT 0.7 0.9  PROT 6.7 6.0*  ALBUMIN  3.5 3.1*     Antibiotics: Anti-infectives (From admission, onward)    Start     Dose/Rate Route Frequency Ordered Stop   08/23/24 1815  ceFEPIme (MAXIPIME) 2 g in sodium chloride  0.9 % 100 mL IVPB        2 g 200 mL/hr over 30 Minutes Intravenous  Once 08/23/24 1807 08/23/24 1904   08/23/24 1800  vancomycin  (VANCOCIN ) IVPB 1000 mg/200 mL premix        1,000 mg 200 mL/hr over 60 Minutes Intravenous  Once 08/23/24 1750 08/23/24 2300   08/23/24 1800  metroNIDAZOLE (FLAGYL) IVPB 500 mg        500 mg 100 mL/hr over 60 Minutes Intravenous  Once 08/23/24 1750 08/23/24 2155        DVT prophylaxis: Apixaban   Code Status: Full code  Family Communication:    CONSULTS    Subjective      Objective    Physical Examination:   Status is: Inpatient:             [name]   [facility] If 7PM-7AM, please contact night-coverage at [email or web address], Office  [phone]   08/26/2024, 7:50 AM  LOS: 1 day

## 2024-08-26 NOTE — Plan of Care (Signed)
  Problem: Education: Goal: Ability to describe self-care measures that may prevent or decrease complications (Diabetes Survival Skills Education) will improve Outcome: Progressing   Problem: Fluid Volume: Goal: Ability to maintain a balanced intake and output will improve Outcome: Progressing   Problem: Nutritional: Goal: Maintenance of adequate nutrition will improve Outcome: Progressing   Problem: Education: Goal: Knowledge of General Education information will improve Description: Including pain rating scale, medication(s)/side effects and non-pharmacologic comfort measures Outcome: Progressing   Problem: Clinical Measurements: Goal: Will remain free from infection Outcome: Progressing   Problem: Nutrition: Goal: Adequate nutrition will be maintained Outcome: Progressing

## 2024-08-26 NOTE — Discharge Summary (Signed)
 Physician Discharge Summary   Patient: Jeffrey Black. MRN: [id number] DOB: [birth year]  Admit date:     08/23/2024  Discharge date: 08/26/24  Discharge Physician: [name]   PCP: [name], MD   Recommendations at discharge:  Question Follow-up cardiology as outpatient  Discharge Diagnoses: Principal Problem:   Hypotension Active Problems:   Type 2 diabetes mellitus with hyperglycemia, without long-term current use of insulin  (HCC)   Hypertension   Alcohol  use disorder in remission   Myocarditis (HCC)   Elevated troponin   Pericardial effusion   Acute myopericarditis   AKI (acute kidney injury)   Coronary artery disease involving native coronary artery of native heart without angina pectoris   PAF (paroxysmal atrial fibrillation) (HCC)  Resolved Problems:   * No resolved hospital problems. [facility] Course: 63 y.o. male with medical history significant of diabetes, alcohol  abuse, history of bronchitis, essential hypertension, psoriatic arthritis, history of polysubstance abuse previously, depression with anxiety, who was recently admitted with chest pain and syncope on September 21.  Patient had pneumomediastinum and shock.  He was in the ICU.  He developed paroxysmal atrial fibrillation with pericardial effusions.  Patient had coronary artery disease and had PCI done developed myocarditis as well as has history of diastolic congestive heart failure.  Patient was hypotensive during recent hospitalization and was on Levophed  in the ICU.  At that time he ruled in with troponin as high as 1350.  Cardiology was consulted and patient was deemed to have noncardiac etiology of his rising troponin.  He had a left heart cath that showed distal RCA lesion up to 65%.  She has PCI performed and discharged on DAPT.  Patient went to his cardiology appointment today but was found to be hypotensive.  This was similar to his previous presentation.  His initial blood pressure was  70/40.  He was diaphoretic pale and near syncopal.  He has been progressively feeling bad since last week when he left the hospital.  He has been weak.  Patient sent to the ER for further evaluation and treatment.  In the ER him continues to have low blood pressure despite fluids resuscitation.  Systolic has been in the 90s now.  He is pericardial effusion was noted after and showed bedside echo.  No significant change from previous.  No evidence of bleed.  He is fully awake alert and communicative.  Cardiology consulted and we admitted the patient to the hospital for evaluation of persistent hypotension.     Assessment and Plan:  #1 persistent hypotension: Patient does not appear to be having any sepsis or septic shock.  Empirically started on antibiotics.  Hypotension is likely medication induced.    Cultures have been obtained and will monitor closely.  IV antibiotics were started and are currently discontinued.  AM cortisol level is 7.5 .  Cardiology consulted, blood pressure medications are currently on hold.  Stopped olmesartan , metoprolol , Lasix .  Olmesartan  can be added as outpatient if needed.   #2 pericardial effusion: Moderate at this point.  Defer to cardiology.  Repeat echocardiogram again showed moderate pericardial effusion, no change from previous.     #3 type 2 diabetes: Continue sliding scale insulin  with NovoLog .  CBG well-controlled   #4 recent myocarditis: Recovering.  Cardiology recommends to continue colchicine  0.6 mg p.o. twice daily for 3 months   #5 AKI: Most likely prerenal.  Resolved with IV hydration.   #6 depression: Continue antidepressants.  Consultants: Cardiology Procedures performed: Echocardiogram Disposition: Home Diet recommendation:  Discharge Diet Orders (From admission, onward)     Start     Ordered   08/26/24 0000  Diet - low sodium heart healthy        08/26/24 1209           Regular diet DISCHARGE MEDICATION: Allergies as of  08/26/2024   No Known Allergies      Medication List     STOP taking these medications    aspirin  EC 81 MG tablet   furosemide  40 MG tablet Commonly known as: LASIX    losartan  25 MG tablet Commonly known as: COZAAR    metoprolol  succinate 25 MG 24 hr tablet Commonly known as: TOPROL -XL   naproxen  sodium 220 MG tablet Commonly known as: ALEVE    olmesartan  5 MG tablet Commonly known as: BENICAR        TAKE these medications    clopidogrel  75 MG tablet Commonly known as: PLAVIX  Take 1 tablet (75 mg total) by mouth daily with breakfast.   colchicine  0.6 MG tablet Take 1 tablet (0.6 mg total) by mouth 2 (two) times daily.   Cosentyx  UnoReady 300 MG/2ML Soaj Generic drug: Secukinumab  Inject 300 mg into the skin every 28 (twenty-eight) days.   Eliquis  5 MG Tabs tablet Generic drug: apixaban  Take 1 tablet (5 mg total) by mouth 2 (two) times daily.   empagliflozin  25 MG Tabs tablet Commonly known as: Jardiance  Take 1 tablet (25 mg total) by mouth daily before breakfast.   ibuprofen  200 MG tablet Commonly known as: ADVIL  Take 200 mg by mouth every 6 (six) hours as needed for mild pain (pain score 1-3) or moderate pain (pain score 4-6).   metFORMIN  500 MG tablet Commonly known as: GLUCOPHAGE  Take 2 tablets (1,000 mg total) by mouth 2 (two) times daily with a meal.   rosuvastatin  20 MG tablet Commonly known as: CRESTOR  Take 1 tablet (20 mg total) by mouth daily.        Follow-up Information     Campbell, Kenzie E, NP. Schedule an appointment as soon as possible for a visit.   Specialty: Cardiology Contact information: [address] [place] [address] [phone]         [name], MD Follow up in 2 week(s).   Specialty: Family Medicine Contact information: [address] [place] [address] [phone]                Discharge Exam: Jeffrey Black   08/23/24 1950  Weight: 90.7 kg   General-appears  in no acute distress Heart-S1-S2, regular, no murmur auscultated Lungs-clear to auscultation bilaterally, no wheezing or crackles auscultated Abdomen-soft, nontender, no organomegaly Extremities-no edema in the lower extremities Neuro-alert, oriented x3, no focal deficit noted  Condition at discharge: good  The results of significant diagnostics from this hospitalization (including imaging, microbiology, ancillary and laboratory) are listed below for reference.   Imaging Studies: ECHOCARDIOGRAM LIMITED Result Date: 08/24/2024    ECHOCARDIOGRAM LIMITED REPORT   Patient Name:   Bandy Honaker. Date of Exam: 08/24/2024 Medical Rec #:  [id number]            Height:       71.0 in Accession #:    [id number]           Weight:       200.0 lb Date of Birth:  Jan 01, 1961             BSA:  2.108 m Patient Age:    63 years             BP:           103/65 mmHg Patient Gender: M                    HR:           60 bpm. Exam Location:  Inpatient Procedure: Limited Echo, Cardiac Doppler and Color Doppler (Both Spectral and            Color Flow Doppler were utilized during procedure). Indications:    I31.3 Pericardial effusion (noninflammatory)  History:        Patient has prior history of Echocardiogram examinations, most                 recent 08/09/2024. CAD and Previous Myocardial Infarction,                 Abnormal ECG, Arrythmias:Atrial Fibrillation; Risk                 Factors:Hypertension. ETOH. Pericardial effusion. Myocarditis.                 Shock.  Sonographer:    Ellouise Mose RDCS Referring Phys: [id number] ZANE ADAMS IMPRESSIONS  1. Left ventricular ejection fraction, by estimation, is 50 to 55%. The left ventricle has low normal function. The left ventricle has no regional wall motion abnormalities.  2. Right ventricular systolic function is low normal. The right ventricular size is moderately enlarged.  3. Moderate pericardial effusion. The pericardial effusion is circumferential. There is no  evidence of cardiac tamponade.  4. The mitral valve is normal in structure. Trivial mitral valve regurgitation.  5. The aortic valve is tricuspid. Aortic valve regurgitation is not visualized. Aortic valve sclerosis/calcification is present, without any evidence of aortic stenosis. Comparison(s): A prior study was performed on 08/09/2024. No significant change from prior study. Prior images reviewed side by side. FINDINGS  Left Ventricle: Left ventricular ejection fraction, by estimation, is 50 to 55%. The left ventricle has low normal function. The left ventricle has no regional wall motion abnormalities. Right Ventricle: The right ventricular size is moderately enlarged. Right ventricular systolic function is low normal. Left Atrium: Left atrial size was normal in size. Right Atrium: Right atrial size was normal in size. Pericardium: A moderately sized pericardial effusion is present. The pericardial effusion is circumferential. There is no evidence of cardiac tamponade. Mitral Valve: The mitral valve is normal in structure. Trivial mitral valve regurgitation. Tricuspid Valve: The tricuspid valve is normal in structure. Tricuspid valve regurgitation is trivial. Aortic Valve: The aortic valve is tricuspid. Aortic valve regurgitation is not visualized. Aortic valve sclerosis/calcification is present, without any evidence of aortic stenosis. Pulmonic Valve: The pulmonic valve was normal in structure. Additional Comments: Spectral Doppler performed. Color Doppler performed.  LEFT VENTRICLE PLAX 2D LVIDd:         5.37 cm LVIDs:         3.92 cm LV PW:         1.02 cm LV IVS:        1.01 cm  IVC IVC diam: 1.25 cm AORTIC VALVE LVOT Vmax:   73.70 cm/s LVOT Vmean:  45.400 cm/s LVOT VTI:    0.145 m MITRAL VALVE MV Area (PHT): 2.76 cm    SHUNTS MV Decel Time: 275 msec    Systemic VTI: 0.14 m MV E velocity: 52.70 cm/s MV A  velocity: 59.60 cm/s MV E/A ratio:  0.88 Emeline Calender Electronically signed by Emeline Calender Signature  Date/Time: 08/24/2024/5:31:08 PM    Final    CT Angio Chest PE W and/or Wo Contrast Result Date: 08/23/2024 CLINICAL DATA:  Pulmonary embolism suspected with high probability. EXAM: CT ANGIOGRAPHY CHEST WITH CONTRAST TECHNIQUE: Multidetector CT imaging of the chest was performed using the standard protocol during bolus administration of intravenous contrast. Multiplanar CT image reconstructions and MIPs were obtained to evaluate the vascular anatomy. RADIATION DOSE REDUCTION: This exam was performed according to the departmental dose-optimization program which includes automated exposure control, adjustment of the mA and/or kV according to patient size and/or use of iterative reconstruction technique. CONTRAST:  65mL OMNIPAQUE  IOHEXOL  350 MG/ML SOLN COMPARISON:  Chest radiograph 08/23/2024. CT chest abdomen and pelvis 08/07/2024. Cardiac MRI 08/09/2024 the FINDINGS: Cardiovascular: Technically adequate study with good opacification of the central and segmental pulmonary arteries. No focal filling defects. No evidence of significant pulmonary embolus. Mild cardiac enlargement. Moderate pericardial effusion increasing since prior study. Normal caliber thoracic aorta. Great vessel origins are patent. No aortic dissection. Mediastinum/Nodes: Small esophageal hiatal hernia. Esophagus is decompressed. No significant lymphadenopathy. Thyroid  gland is unremarkable. Lungs/Pleura: Mild dependent atelectasis in the lung bases. No airspace disease or consolidation. No pleural effusion or pneumothorax. Upper Abdomen: No acute abnormality. Musculoskeletal: No chest wall abnormality. No acute or significant osseous findings. Review of the MIP images confirms the above findings. IMPRESSION: 1. No evidence of significant pulmonary embolus. 2. No evidence of aortic aneurysm or dissection. 3. Cardiac enlargement. Moderate pericardial effusion is increasing since the prior study. 4. No evidence of active pulmonary disease. 5. Small  esophageal hiatal hernia. Electronically Signed   By: Elsie Gravely M.D.   On: 08/23/2024 18:55   DG Chest Port 1 View Result Date: 08/23/2024 CLINICAL DATA:  Chest pain. EXAM: PORTABLE CHEST 1 VIEW COMPARISON:  August 11, 2024. FINDINGS: The heart size and mediastinal contours are within normal limits. Both lungs are clear. The visualized skeletal structures are unremarkable. IMPRESSION: No active disease. Electronically Signed   By: Lynwood Landy Raddle M.D.   On: 08/23/2024 18:14   DG Chest Port 1 View Result Date: 08/11/2024 EXAM: 1 VIEW(S) XRAY OF THE CHEST 08/11/2024 07:34:00 AM COMPARISON: 08/09/2024 CLINICAL HISTORY: Hypoxia FINDINGS: LUNGS AND PLEURA: Small left pleural effusion. Increased diffuse interstitial markings with increased left retrocardiac opacity. No pulmonary edema. No pneumothorax. HEART AND MEDIASTINUM: Cardiomegaly. BONES AND SOFT TISSUES: No acute osseous abnormality. IMPRESSION: 1. Increased left retrocardiac opacity, which may reflect atelectasis or developing consolidation. 2. Diffuse increased interstitial markings concerning for pulmonary edema. 3. Small left pleural effusion. 4. Cardiomegaly. Electronically signed by: Waddell Calk MD 08/11/2024 08:16 AM EDT RP Workstation: [id number]   CARDIAC CATHETERIZATION Result Date: 08/10/2024 Table formatting from the original result was not included. Images from the original result were not included.   Likely culprit lesion: Dist RCA lesion is 65% stenosed (focal ulcerated plaque) with 10% stenosed RPA V side branch in RPAV.   A drug-eluting stent was successfully placed from a branch into the RPA V sidebranch (across the RPDA) using a STENT SYNERGY XD 3.50X20 in the main branch and side branch.  Stent was deployed at 3.8 mm and postdilated to 4.1 mm in the main branch.   Post intervention, there is a 0% residual stenosis. Post intervention, the side branch was reduced to 0% residual stenosis.  TIMI-3 flow restored.    --------------------------------------------------------------------------- -----   Mid LAD lesion is 20%  stenosed with 20% stenosed side branch in 2nd Diag.   --------------------------------------------------------------------------- -----   LV end diastolic pressure is severely elevated.   There is no aortic valve stenosis. Diagnostic: Dominance: Right                                                          Intervention  Likely culprit lesion for MI (with inferior MI seen on MRI) is a ulcerated plaque (65%) in the mid RCA just proximal to a high bifurcation into PDA and PAV.   Otherwise the rest of the RCA with several PL branches and PDA are free of disease. Successful DES PCI of 65% ulcerated plaque mid RCA stenting from RCA into RPA V with 3.5 mm x 20 mm Synergy XD deployed 3.8 mm, and postdilated proximally to 4.1 mm upstream of the bifurcation.  The stent was fully expanded however there still remains a small ledge of the plaque outside of the stent but notable improvement of flow. = Lesion essentially reduced to 0%> LCA: Other than 20% mid LAD at D1 takeoff, angiographically minimal disease.  1 small 1 moderate caliber diagonal branch with extensive bifurcation.  LCx terminates as an OM1-OM2 with an AV groove branch and small posterolateral branch. Severely  elevated LVEDP of roughly 30 mmHg consistent with Acute Diastolic Heart Failure Treated with 40 mg IV Lasix  in the Cath Lab.  RECOMMENDATIONS   The patient was loaded with ticagrelor /Brilinta  in the Cath Lab, but it became known that he would likely require DOAC and therefore the plan is to reload with Plavix /clopidogrel  the morning of 08/11/2024 and then continue Plavix  75 mg daily from then on   Recommend to resume undecided DOAC, at currently prescribed dose and frequency on 08/11/2024.   Recommend concurrent antiplatelet therapy of Aspirin  81 mg for 1 month and Clopidogrel  75mg  daily for 6 months  If there is increased bleeding risk concern, could  consider shortening aspirin  to 2 weeks .   Due to significant elevated LVEDP, he was given 40 mg IV Lasix  in the Cath Lab, would reassess volume status in the morning and strongly consider additional diuresis. PLAN OF CARE: Continue ongoing care for URI and likely myocarditis.  I suspect that the MRI and RCA lesion were incidental findings.  I have written to convert from Brilinta  to clopidogrel  (loading dose 300 mg morning 08/11/2024 and then 25 mg daily after that, provided that the plan is to place the patient on a DOAC.  With elevated EDP of roughly 30 mmHg, would likely consider additional diuresis (he was given 40 mg IV Lasix  in the Cath Lab and would assess in the morning.  Once stable from a cardiovascular standpoint, can begin to titrate GDMT.  Alm Clay  MR CARDIAC MORPHOLOGY W WO CONTRAST Result Date: 08/09/2024 CLINICAL DATA:  52M p/w shock, chest pain, troponin elevated to 2114. Echo with EF 50-55%, moderate pericardial effusion. EXAM: CARDIAC MRI TECHNIQUE: The patient was scanned on a 1.5 Tesla Siemens magnet. A dedicated cardiac coil was used. Functional imaging was done using Fiesta sequences. 2,3, and 4 chamber views were done to assess for RWMA's. Modified Simpson's rule using a short axis stack was used to calculate an ejection fraction on a dedicated work [employer]. The patient received 10 cc of Gadavist . After 10  minutes inversion recovery sequences were used to assess for infiltration and scar tissue. Phase contrast velocity mapping was performed CONTRAST:  10 cc  of Gadavist  FINDINGS: Left ventricle: -Cine images were not interpretable due to artifact from cardiac/respiratory motion. Unable to calculate LV volumes and EF -Subendocardial LGE in apical inferior wall consistent with small infarct -Elevated myocardial T1/T2/ECV values consistent with myocardial edema. -RV insertion site LGE, which can be seen in setting of elevated pulmonary pressures Right ventricle:  Cine images were not interpretable due to artifact from cardiac/respiratory motion. 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 NO pericardial LGE IMPRESSION: 1. Cine images were not interpretable due to artifact from cardiac/respiratory motion. Unable to calculate LV/RV volumes and EF 2. Subendocardial LGE in apical inferior wall consistent with small infarct 3. Elevated myocardial T1/T2/ECV values consistent with myocardial edema 4. Moderate pericardial effusion measuring up to 1.5cm adjacent to LV inferior wall 5.  Dilated ascending aorta measuring 40mm 6. Dilated main pulmonary artery measuring 34mm. RV insertion site LGE, which can be seen in setting of elevated pulmonary pressures 7. Clinical presentation suggests myocarditis, and meets criteria for acute myocarditis on CMR with regional elevation in T1/T2/ECV levels. However, given LGE imaging shows evidence of small infarct in apical inferior wall, would be important to clarify if this is an acute or old infarct. Recommend cardiac catheterization. Given elevated T1/T2 levels are not just located around the infarct, but in multiple regions throughout the LV, suspect more likely myocarditis but important to rule out acute infarct Electronically Signed   By: Lonni Nanas M.D.   On: 08/09/2024 22:43   ECHOCARDIOGRAM LIMITED Result Date: 08/09/2024    ECHOCARDIOGRAM LIMITED REPORT   Patient Name:   Derrek Puff. Date of Exam: 08/09/2024 Medical Rec #:  [id number]            Height:       71.5 in Accession #:    [id number]           Weight:       210.3 lb Date of Birth:  10/15/1961             BSA:          2.165 m Patient Age:    63 years             BP:            93/70 mmHg Patient Gender: M                    HR:           84 bpm. Exam Location:  Inpatient Procedure: Limited Echo and Cardiac Doppler (Both Spectral and Color Flow            Doppler were utilized during procedure). Indications:    Pericardial Effusion I31.3  History:        Patient has prior history of Echocardiogram examinations, most                 recent 08/08/2024.  Sonographer:    Tinnie Gosling RDCS Referring Phys: [phone] TRACI R TURNER IMPRESSIONS  1. Left ventricular ejection fraction, by estimation, is 50 to 55%. The left ventricle has low normal function. There is mild  concentric left ventricular hypertrophy.  2. Right ventricular systolic function is mildly reduced. Tricuspid regurgitation signal is inadequate for assessing PA pressure.  3. Moderate pericardial effusion. The pericardial effusion is circumferential. There is no evidence of cardiac tamponade.  4. The inferior vena cava is dilated in size with <50% respiratory variability, suggesting right atrial pressure of 20 mmHg. Comparison(s): No significant change from prior study. Prior images reviewed side by side. FINDINGS  Left Ventricle: Left ventricular ejection fraction, by estimation, is 50 to 55%. The left ventricle has low normal function. The left ventricular internal cavity size was normal in size. There is mild concentric left ventricular hypertrophy. Right Ventricle: Right ventricular systolic function is mildly reduced. Tricuspid regurgitation signal is inadequate for assessing PA pressure. Pericardium: A moderately sized pericardial effusion is present. The pericardial effusion is circumferential. There is no evidence of cardiac tamponade. Venous: The inferior vena cava is dilated in size with less than 50% respiratory variability, suggesting right atrial pressure of 15 mmHg. LEFT VENTRICLE PLAX 2D LVIDd:         4.60 cm LVIDs:         2.80 cm LV PW:         1.20 cm LV IVS:        1.20 cm  IVC IVC diam: 2.90 cm Stanly Leavens  MD Electronically signed by Stanly Leavens MD Signature Date/Time: 08/09/2024/11:43:30 AM    Final    DG Chest Port 1 View Result Date: 08/09/2024 CLINICAL DATA:  10028 Wheezing [id number]. EXAM: PORTABLE CHEST 1 VIEW COMPARISON:  08/08/2024. FINDINGS: Mild coarse interstitial markings are grossly similar to the prior study. Bilateral lung fields are clear. No acute consolidation, lung collapse or pulmonary edema. Bilateral costophrenic angles are clear. Redemonstration of elevated right hemidiaphragm. Stable cardio-mediastinal silhouette. No acute osseous abnormalities. The soft tissues are within normal limits. IMPRESSION: No active disease. Electronically Signed   By: Ree Molt M.D.   On: 08/09/2024 08:15   VAS US  LOWER EXTREMITY VENOUS (DVT) Result Date: 08/08/2024  Lower Venous DVT Study Patient Name:  Adyen Bifulco.  Date of Exam:   08/08/2024 Medical Rec #: [id number]             Accession #:    [id number] Date of Birth: Jan 23, 1961              Patient Gender: M Patient Age:   27 years Exam [facility]  [facility] Procedure:      VAS US  LOWER EXTREMITY VENOUS (DVT) Referring Phys: LEITA EINSTEIN --------------------------------------------------------------------------------  Indications: Acute hypoxic respiratory failure.  Comparison Study: No previous exams Performing Technologist: Jody Hill RVT, RDMS  Examination Guidelines: A complete evaluation includes B-mode imaging, spectral Doppler, color Doppler, and power Doppler as needed of all accessible portions of each vessel. Bilateral testing is considered an integral part of a complete examination. Limited examinations for reoccurring indications may be performed as noted. The reflux portion of the exam is performed with the patient in reverse Trendelenburg.  +---------+---------------+---------+-----------+----------+--------------+ RIGHT    CompressibilityPhasicitySpontaneityPropertiesThrombus Aging  +---------+---------------+---------+-----------+----------+--------------+ CFV      Full           Yes      Yes                                 +---------+---------------+---------+-----------+----------+--------------+ SFJ      Full                                                        +---------+---------------+---------+-----------+----------+--------------+  FV Prox  Full           Yes      Yes                                 +---------+---------------+---------+-----------+----------+--------------+ FV Mid   Full           Yes      Yes                                 +---------+---------------+---------+-----------+----------+--------------+ FV DistalFull           Yes      Yes                                 +---------+---------------+---------+-----------+----------+--------------+ PFV      Full                                                        +---------+---------------+---------+-----------+----------+--------------+ POP      Full           Yes      Yes                                 +---------+---------------+---------+-----------+----------+--------------+ PTV      Full                                                        +---------+---------------+---------+-----------+----------+--------------+ PERO     Full                                                        +---------+---------------+---------+-----------+----------+--------------+   +---------+---------------+---------+-----------+-------------+--------------+ LEFT     CompressibilityPhasicitySpontaneityProperties   Thrombus Aging +---------+---------------+---------+-----------+-------------+--------------+ CFV      Full           Yes      Yes                                    +---------+---------------+---------+-----------+-------------+--------------+ SFJ      Full                               rouleaux flow                +---------+---------------+---------+-----------+-------------+--------------+ FV Prox  Full           Yes      Yes                                    +---------+---------------+---------+-----------+-------------+--------------+ FV Mid   Full  Yes      Yes                                    +---------+---------------+---------+-----------+-------------+--------------+ FV DistalFull           Yes      Yes                                    +---------+---------------+---------+-----------+-------------+--------------+ PFV      Full                                                           +---------+---------------+---------+-----------+-------------+--------------+ POP      Full           Yes      Yes                                    +---------+---------------+---------+-----------+-------------+--------------+ PTV      Full                                                           +---------+---------------+---------+-----------+-------------+--------------+ PERO     Full                                                           +---------+---------------+---------+-----------+-------------+--------------+     Summary: BILATERAL: - No evidence of deep vein thrombosis seen in the lower extremities, bilaterally. -No evidence of popliteal cyst, bilaterally.   *See table(s) above for measurements and observations. Electronically signed by Debby Robertson on 08/08/2024 at 4:47:20 PM.    Final    ECHOCARDIOGRAM COMPLETE Result Date: 08/08/2024    ECHOCARDIOGRAM REPORT   Patient Name:   Briggs Edelen. Date of Exam: 08/08/2024 Medical Rec #:  [id number]            Height:       71.5 in Accession #:    [id number]           Weight:       214.5 lb Date of Birth:  1961-06-16             BSA:          2.184 m Patient Age:    63 years             BP:           85/58 mmHg Patient Gender: M                    HR:           104 bpm. Exam Location:  Inpatient Procedure:  2D Echo, Cardiac Doppler and Color Doppler (Both Spectral and Color  Flow Doppler were utilized during procedure). Indications:    R07.9* Chest pain, unspecified  History:        Patient has prior history of Echocardiogram examinations, most                 recent 07/24/2013. Previous Myocardial Infarction; Risk                 Factors:Hypertension and Diabetes. ETOH. Shock.  Sonographer:    Ellouise Mose RDCS Referring Phys: [id number] LAURA R GLEASON IMPRESSIONS  1. Left ventricular ejection fraction, by estimation, is 55 to 60%. The left ventricle has normal function. The left ventricle has no regional wall motion abnormalities. There is moderate concentric left ventricular hypertrophy. Left ventricular diastolic parameters are consistent with Grade I diastolic dysfunction (impaired relaxation).  2. Right ventricular systolic function is mildly reduced. The right ventricular size is normal. Tricuspid regurgitation signal is inadequate for assessing PA pressure.  3. The mitral valve is normal in structure. Trivial mitral valve regurgitation. No evidence of mitral stenosis.  4. The aortic valve is tricuspid. There is moderate calcification of the aortic valve. Aortic valve regurgitation is not visualized. Aortic valve sclerosis/calcification is present, without any evidence of aortic stenosis.  5. Aortic dilatation noted. There is borderline dilatation of the aortic root, measuring 38 mm.  6. The inferior vena cava is dilated in size with <50% respiratory variability, suggesting right atrial pressure of 15 mmHg.  7. Small to moderate circumferential pericardial effusion. The IVC is dilated but there is no RV diastolic collapse. There is < 25% respirophasic variation of mitral inflow E wave. There does not appear to be pericardial tamponade. FINDINGS  Left Ventricle: Left ventricular ejection fraction, by estimation, is 55 to 60%. The left ventricle has normal function. The left ventricle has no regional wall  motion abnormalities. The left ventricular internal cavity size was normal in size. There is  moderate concentric left ventricular hypertrophy. Left ventricular diastolic parameters are consistent with Grade I diastolic dysfunction (impaired relaxation). Right Ventricle: The right ventricular size is normal. No increase in right ventricular wall thickness. Right ventricular systolic function is mildly reduced. Tricuspid regurgitation signal is inadequate for assessing PA pressure. Left Atrium: Left atrial size was normal in size. Right Atrium: Right atrial size was normal in size. Pericardium: Small to moderate circumferential pericardial effusion. The IVC is dilated but there is no RV diastolic collapse. There is < 25% respirophasic variation of mitral inflow E wave. There does not appear to be pericardial tamponade. Mitral Valve: The mitral valve is normal in structure. Trivial mitral valve regurgitation. No evidence of mitral valve stenosis. Tricuspid Valve: The tricuspid valve is normal in structure. Tricuspid valve regurgitation is not demonstrated. Aortic Valve: The aortic valve is tricuspid. There is moderate calcification of the aortic valve. Aortic valve regurgitation is not visualized. Aortic valve sclerosis/calcification is present, without any evidence of aortic stenosis. Pulmonic Valve: The pulmonic valve was normal in structure. Pulmonic valve regurgitation is trivial. Aorta: Aortic dilatation noted. There is borderline dilatation of the aortic root, measuring 38 mm. Venous: The inferior vena cava is dilated in size with less than 50% respiratory variability, suggesting right atrial pressure of 15 mmHg. IAS/Shunts: No atrial level shunt detected by color flow Doppler.  LEFT VENTRICLE PLAX 2D LVIDd:         3.90 cm     Diastology LVIDs:         3.10 cm     LV e' medial:  6.36 cm/s LV PW:         1.60 cm     LV E/e' medial:  11.4 LV IVS:        1.90 cm     LV e' lateral:   5.55 cm/s LVOT diam:     2.40  cm     LV E/e' lateral: 13.1 LV SV:         67 LV SV Index:   30 LVOT Area:     4.52 cm  LV Volumes (MOD) LV vol d, MOD A2C: 60.5 ml LV vol d, MOD A4C: 73.9 ml LV vol s, MOD A2C: 41.5 ml LV vol s, MOD A4C: 25.3 ml LV SV MOD A2C:     19.0 ml LV SV MOD A4C:     73.9 ml LV SV MOD BP:      30.7 ml RIGHT VENTRICLE             IVC RV S prime:     15.30 cm/s  IVC diam: 2.70 cm TAPSE (M-mode): 1.9 cm LEFT ATRIUM             Index        RIGHT ATRIUM           Index LA diam:        3.40 cm 1.56 cm/m   RA Area:     16.70 cm LA Vol (A2C):   36.0 ml 16.49 ml/m  RA Volume:   43.70 ml  20.01 ml/m LA Vol (A4C):   25.9 ml 11.86 ml/m LA Biplane Vol: 32.7 ml 14.98 ml/m  AORTIC VALVE LVOT Vmax:   91.70 cm/s LVOT Vmean:  61.600 cm/s LVOT VTI:    0.147 m  AORTA Ao Root diam: 3.80 cm Ao Asc diam:  3.90 cm MITRAL VALVE MV Area (PHT): 6.77 cm    SHUNTS MV Decel Time: 112 msec    Systemic VTI:  0.15 m MV E velocity: 72.70 cm/s  Systemic Diam: 2.40 cm MV A velocity: 83.66 cm/s MV E/A ratio:  0.87 Dalton McleanMD Electronically signed by Ezra Kanner Signature Date/Time: 08/08/2024/3:03:53 PM    Final    DG CHEST PORT 1 VIEW Result Date: 08/08/2024 CLINICAL DATA:  33352 Pneumomediastinum (HCC) 33352 EXAM: PORTABLE CHEST - 1 VIEW COMPARISON:  08/01/2013 FINDINGS: Patchy retrocardiac airspace opacities. No pleural effusion or pneumothorax. Mild cardiomegaly. Tortuous aorta with aortic atherosclerosis. No acute fracture or destructive lesions. Multilevel thoracic osteophytosis. IMPRESSION: 1. Patchy retrocardiac airspace opacities, likely reflecting atelectasis. 2. No pneumomediastinum. Electronically Signed   By: Rogelia Myers M.D.   On: 08/08/2024 09:16   US  EKG SITE RITE Result Date: 08/07/2024 If Site Rite image not attached, placement could not be confirmed due to current cardiac rhythm.  CT ANGIO HEAD NECK W WO CM Result Date: 08/07/2024 CLINICAL DATA:  Vertebral artery dissection suspected. Syncope. Chest pain. EXAM:  CT ANGIOGRAPHY HEAD AND NECK WITH AND WITHOUT CONTRAST TECHNIQUE: Multidetector CT imaging of the head and neck was performed using the standard protocol during bolus administration of intravenous contrast. Multiplanar CT image reconstructions and MIPs were obtained to evaluate the vascular anatomy. Carotid stenosis measurements (when applicable) are obtained utilizing NASCET criteria, using the distal internal carotid diameter as the denominator. RADIATION DOSE REDUCTION: This exam was performed according to the departmental dose-optimization program which includes automated exposure control, adjustment of the mA and/or kV according to patient size and/or use of iterative reconstruction technique. CONTRAST:  OMNIPAQUE  IOHEXOL  350 MG/ML SOLN  COMPARISON:  Head CT 06/02/2015 FINDINGS: CT HEAD FINDINGS Brain: There is no evidence of an acute infarct, intracranial hemorrhage, mass, midline shift, or extra-axial fluid collection. Cerebral white matter hypodensities are nonspecific but compatible with mild chronic small vessel ischemic disease. There is mild cerebellar greater than cerebral atrophy. Vascular: Calcified atherosclerosis at the skull base. Skull: No fracture or suspicious lesion. Sinuses/Orbits: Paranasal sinuses and mastoid air cells are clear. Unremarkable orbits. Other: None. Review of the MIP images confirms the above findings CTA NECK FINDINGS Aortic arch: Standard branching with mild atherosclerosis. Widely patent brachiocephalic and left subclavian arteries. The right subclavian artery appears patent proximally but is otherwise largely obscured by streak artifact from dense venous contrast. Right carotid system: The proximal common carotid artery is obscured by streak artifact, however the vessel is widely patent and normal in appearance more distally. The cervical ICA is patent with mild scattered plaque not resulting in significant stenosis. Left carotid system: Patent with a small amount of  plaque at the carotid bifurcation and distal cervical ICA without evidence of a significant stenosis or dissection. Vertebral arteries: Patent and codominant. Dense venous contrast completely obscures the right V2 segment in the C4-5 region. There is no evidence of a flow limiting stenosis or dissection elsewhere. Skeleton: Moderate multilevel cervical disc degeneration and left C2-3 facet arthrosis. Other neck: No evidence of cervical lymphadenopathy or mass, with streak artifact limiting assessment of the lower neck/thoracic inlet. Venous gas scattered throughout the neck and head, presumably iatrogenic. Upper chest: Clear lung apices. Review of the MIP images confirms the above findings CTA HEAD FINDINGS Anterior circulation: The internal carotid arteries are patent from skull base to carotid termini with mild atherosclerotic calcification not resulting in significant stenosis. ACAs and MCAs are patent without evidence of a proximal branch occlusion or significant proximal stenosis. No aneurysm is identified. Posterior circulation: The intracranial vertebral arteries are widely patent to the basilar with mild atheromatous irregularity involving the right greater than left V4 segments. The right PICA and left AICA appear dominant. Patent SCA origins are visualized bilaterally. The basilar artery is widely patent. There are large left and diminutive or absent right posterior communicating arteries with hypoplasia of the left P1 segment. Both PCAs are patent without evidence of a significant proximal stenosis. No aneurysm is identified. Venous sinuses: As permitted by contrast timing, patent. Anatomic variants: Fetal left PCA. Review of the MIP images confirms the above findings IMPRESSION: 1. No evidence of acute intracranial abnormality. 2. Mild atherosclerosis in the head and neck without a large vessel occlusion, significant stenosis, or dissection identified within above described limitations. Electronically  Signed   By: Dasie Hamburg M.D.   On: 08/07/2024 16:27   CT Angio Chest/Abd/Pel for Dissection W and/or Wo Contrast Result Date: 08/07/2024 CLINICAL DATA:  Acute aortic syndrome suspected. EXAM: CT ANGIOGRAPHY CHEST, ABDOMEN AND PELVIS TECHNIQUE: Non-contrast CT of the chest was initially obtained. Multidetector CT imaging through the chest, abdomen and pelvis was performed using the standard protocol during bolus administration of intravenous contrast. Multiplanar reconstructed images and MIPs were obtained and reviewed to evaluate the vascular anatomy. RADIATION DOSE REDUCTION: This exam was performed according to the departmental dose-optimization program which includes automated exposure control, adjustment of the mA and/or kV according to patient size and/or use of iterative reconstruction technique. CONTRAST:  OMNIPAQUE  IOHEXOL  350 MG/ML SOLN COMPARISON:  CT abdomen 02/25/2013 FINDINGS: CTA CHEST FINDINGS Cardiovascular: Preferential opacification of the thoracic aorta. No evidence of thoracic aortic aneurysm or dissection.  Normal heart size. No pericardial effusion. Mediastinum/Nodes: There is a small hiatal hernia. The esophagus appears within normal limits. There are 2 hypodense left thyroid  nodules measuring up to 6 mm. There is a small amount of pneumomediastinum anteriorly. Enlarged subcarinal lymph nodes measure up to 17 mm short axis. Enlarged right hilar lymph nodes measure up to 11 mm short axis. Enlarged precarinal lymph node measures 14 mm. There also prominent, but nonenlarged paratracheal and AP window lymph nodes. Lungs/Pleura: There is a trace left pleural effusion. There are minimal ground-glass opacities in the inferior left lower lobe. The lungs are otherwise clear. There is no pneumothorax. Musculoskeletal: There is a small amount of air seen within the anterior chest wall, possibly vascular in origin. No acute fractures are seen. Review of the MIP images confirms the above  findings. CTA ABDOMEN AND PELVIS FINDINGS VASCULAR Aorta: Normal caliber aorta without aneurysm, dissection, vasculitis or significant stenosis. There is moderate calcified atherosclerotic disease throughout the aorta. Celiac: Patent without evidence of aneurysm, dissection, vasculitis or significant stenosis. SMA: Patent without evidence of aneurysm, dissection, vasculitis or significant stenosis. Renals: Both renal arteries are patent without evidence of aneurysm, dissection, vasculitis, fibromuscular dysplasia or significant stenosis. IMA: Patent without evidence of aneurysm, dissection, vasculitis or significant stenosis. Inflow: Patent without evidence of aneurysm, dissection, vasculitis or significant stenosis. Mild calcified atherosclerotic disease present. Veins: No obvious venous abnormality within the limitations of this arterial phase study. Review of the MIP images confirms the above findings. NON-VASCULAR Hepatobiliary: No focal liver abnormality is seen. No gallstones, gallbladder wall thickening, or biliary dilatation. Pancreas: Unremarkable. No pancreatic ductal dilatation or surrounding inflammatory changes. Spleen: Normal in size without focal abnormality. Adrenals/Urinary Tract: There is mild nonspecific thickening of the bilateral adrenal glands. The bilateral kidneys appear within normal limits. The bladder is within normal limits. Stomach/Bowel: Stomach is within normal limits. Appendix appears normal. No evidence of bowel wall thickening, distention, or inflammatory changes. There is a large amount of stool throughout the colon. There is sigmoid colon diverticulosis. Lymphatic: There are enlarged periportal lymph nodes measuring up to 16 mm short axis. There are nonenlarged retroperitoneal, iliac chain and inguinal lymph nodes. Reproductive: Prostate is unremarkable. Other: There is a small fat containing right inguinal hernia. There is a small fat containing umbilical hernia. No free fluid.  Musculoskeletal: No acute or significant osseous findings. Review of the MIP images confirms the above findings. IMPRESSION: 1. No evidence for aortic dissection or aneurysm. 2. Small amount of pneumomediastinum. 3. Trace left pleural effusion. 4. Minimal ground-glass opacities in the left lower lobe may be infectious/inflammatory. 5. Mediastinal and right hilar lymphadenopathy of uncertain etiology. 6. Periportal lymphadenopathy of uncertain etiology. 7. Sigmoid colon diverticulosis. 8. Subcentimeter incidental left thyroid  nodule. Not clinically significant; no follow-up imaging recommended (ref: J Am Coll Radiol. 2015 Feb;12(2): 143-50). Aortic Atherosclerosis (ICD10-I70.0). Electronically Signed   By: Greig Pique M.D.   On: 08/07/2024 16:24    Microbiology: Results for orders placed or performed during the hospital encounter of 08/23/24  Blood Culture (routine x 2)     Status: None (Preliminary result)   Collection Time: 08/23/24  5:35 PM   Specimen: BLOOD  Result Value Ref Range Status   Specimen Description BLOOD SITE NOT SPECIFIED  Final   Special Requests   Final    BOTTLES DRAWN AEROBIC AND ANAEROBIC Blood Culture results may not be optimal due to an inadequate volume of blood received in culture bottles   Culture   Final  NO GROWTH 3 DAYS Performed at [facility], [address], [place], [place] [address]    Report Status PENDING  Incomplete  Blood Culture (routine x 2)     Status: None (Preliminary result)   Collection Time: 08/23/24  5:40 PM   Specimen: BLOOD  Result Value Ref Range Status   Specimen Description BLOOD SITE NOT SPECIFIED  Final   Special Requests   Final    BOTTLES DRAWN AEROBIC AND ANAEROBIC Blood Culture adequate volume   Culture   Final    NO GROWTH 3 DAYS Performed at [facility], [address], [place], [place] [address]    Report Status PENDING  Incomplete  Resp panel by RT-PCR (RSV, Flu A&B, Covid) Anterior Nasal Swab     Status:  None   Collection Time: 08/23/24  6:58 PM   Specimen: Anterior Nasal Swab  Result Value Ref Range Status   SARS Coronavirus 2 by RT PCR NEGATIVE NEGATIVE Final   Influenza A by PCR NEGATIVE NEGATIVE Final   Influenza B by PCR NEGATIVE NEGATIVE Final    Comment: (NOTE) The Xpert Xpress SARS-CoV-2/FLU/RSV plus assay is intended as an aid in the diagnosis of influenza from Nasopharyngeal swab specimens and should not be used as a sole basis for treatment. Nasal washings and aspirates are unacceptable for Xpert Xpress SARS-CoV-2/FLU/RSV testing.  Fact Sheet for Patients: BloggerCourse.com  Fact Sheet for Healthcare Providers: SeriousBroker.it  This test is not yet approved or cleared by the [place]  FDA and has been authorized for detection and/or diagnosis of SARS-CoV-2 by FDA under an Emergency Use Authorization (EUA). This EUA will remain in effect (meaning this test can be used) for the duration of the COVID-19 declaration under Section 564(b)(1) of the Act, 21 U.S.C. section 360bbb-3(b)(1), unless the authorization is terminated or revoked.     Resp Syncytial Virus by PCR NEGATIVE NEGATIVE Final    Comment: (NOTE) Fact Sheet for Patients: BloggerCourse.com  Fact Sheet for Healthcare Providers: SeriousBroker.it  This test is not yet approved or cleared by the [place]  FDA and has been authorized for detection and/or diagnosis of SARS-CoV-2 by FDA under an Emergency Use Authorization (EUA). This EUA will remain in effect (meaning this test can be used) for the duration of the COVID-19 declaration under Section 564(b)(1) of the Act, 21 U.S.C. section 360bbb-3(b)(1), unless the authorization is terminated or revoked.  Performed at [facility], [address], [place], [place] [address]     Labs: CBC: Recent Labs  Lab 08/23/24 1731 08/23/24 1742  08/24/24 0348  WBC 8.7  --  7.4  NEUTROABS 4.7  --   --   HGB 12.9* 13.6 12.1*  HCT 40.4 40.0 37.4*  MCV 88.8  --  88.0  PLT 300  --  260   Basic Metabolic Panel: Recent Labs  Lab 08/23/24 1731 08/23/24 1742 08/24/24 0348  NA 133* 134* 132*  K 4.4 4.4 4.2  CL 96* 98 97*  CO2 25  --  25  GLUCOSE 80 77 116*  BUN 28* 29* 21  CREATININE 1.66* 1.70* 1.23  CALCIUM  9.3  --  8.7*   Liver Function Tests: Recent Labs  Lab 08/23/24 1731 08/24/24 0348  AST 17 15  ALT 19 17  ALKPHOS 53 49  BILITOT 0.7 0.9  PROT 6.7 6.0*  ALBUMIN  3.5 3.1*   CBG: Recent Labs  Lab 08/25/24 1157 08/25/24 1506 08/25/24 2006 08/26/24 0739 08/26/24 1138  GLUCAP 96  119* 127* 132* 94    Discharge time spent: greater than 30 minutes.  Signed: [name], MD [facility] 08/26/2024

## 2024-08-26 NOTE — Telephone Encounter (Signed)
 Patent called for guidance on taking cosentyx  injection.   Patient reports a hospitalization on 08/07/2024 and he had a stent placed.  He was then re-admitted to the hospital on 08/23/2024. Patient reports taking antibiotics off and on during this time period.   Patient is scheduled to follow up with you on 09/07/2024 and he did not know if he should wait until then to address taking cosentyx  injection. Patient has been advised to hold medication until he receives a return call from our office. Patient is aware you are out of the office and that we will call him next week to advise. Please advise.

## 2024-08-28 LAB — CULTURE, BLOOD (ROUTINE X 2)
Culture: NO GROWTH
Culture: NO GROWTH
Special Requests: ADEQUATE

## 2024-08-29 ENCOUNTER — Telehealth: Payer: Self-pay

## 2024-08-29 NOTE — Transitions of Care (Post Inpatient/ED Visit) (Signed)
   08/29/2024  Name: Jeffrey Black. MRN: [id number] DOB: [birth year]  Today's TOC FU Call Status: Today's TOC FU Call Status:: Unsuccessful Call (1st Attempt) Unsuccessful Call (1st Attempt) Date: 08/29/24  Attempted to reach the patient regarding the most recent Inpatient/ED visit.  Follow Up Plan: Additional outreach attempts will be made to reach the patient to complete the Transitions of Care (Post Inpatient/ED visit) call.   Alan Ee, RN, BSN, CEN [employer]- Transition of Care Team.  [facility] [phone]

## 2024-08-30 ENCOUNTER — Telehealth: Payer: Self-pay

## 2024-08-30 NOTE — Transitions of Care (Post Inpatient/ED Visit) (Signed)
 08/30/2024  Name: Jeffrey Black. MRN: [id number] DOB: [birth year]  Today's TOC FU Call Status: Today's TOC FU Call Status:: Successful TOC FU Call Completed TOC FU Call Complete Date: 08/30/24 Patient's Name and Date of Birth confirmed.  Transition Care Management Follow-up Telephone Call How have you been since you were released from the hospital?: Better (reports BP is going up) Any questions or concerns?: No  Items Reviewed: Did you receive and understand the discharge instructions provided?: Yes Medications obtained,verified, and reconciled?: Yes (Medications Reviewed) Any new allergies since your discharge?: No Dietary orders reviewed?: Yes Type of Diet Ordered:: Low sodium heart healthy diet Do you have support at home?: Yes People in Home [RPT]: alone Name of Support/Comfort Primary Source: sister and neighbor  Medications Reviewed Today: Medications Reviewed Today     Reviewed by Rumalda Alan PENNER, RN (Registered Nurse) on 08/30/24 at 1318  Med List Status: <None>   Medication Order Taking? Sig Documenting Provider Last Dose Status Informant  apixaban  (ELIQUIS ) 5 MG TABS tablet [id number] Yes Take 1 tablet (5 mg total) by mouth 2 (two) times daily. Patsy Lenis, MD  Active Self, Pharmacy Records  clopidogrel  (PLAVIX ) 75 MG tablet [id number] Yes Take 1 tablet (75 mg total) by mouth daily with breakfast. Patsy Lenis, MD  Active Self, Pharmacy Records  colchicine  0.6 MG tablet [id number] Yes Take 1 tablet (0.6 mg total) by mouth 2 (two) times daily. Patsy Lenis, MD  Active Self, Pharmacy Records  empagliflozin  (JARDIANCE ) 25 MG TABS tablet [id number] Yes Take 1 tablet (25 mg total) by mouth daily before breakfast. [name], MD  Active Self, Pharmacy Records  ibuprofen  (ADVIL ) 200 MG tablet [id number]  Take 200 mg by mouth every 6 (six) hours as needed for mild pain (pain score 1-3) or moderate pain (pain score 4-6).  Patient not taking: Reported on 08/30/2024    [provider]  Active Self, Pharmacy Records  metFORMIN  (GLUCOPHAGE ) 500 MG tablet [id number] Yes Take 2 tablets (1,000 mg total) by mouth 2 (two) times daily with a meal. [name], MD  Active Self, Pharmacy Records  rosuvastatin  (CRESTOR ) 20 MG tablet [id number] Yes Take 1 tablet (20 mg total) by mouth daily. Patsy Lenis, MD  Active Self, Pharmacy Records  Secukinumab  (COSENTYX  UNOREADY) 300 MG/2ML EMMANUEL [id number]  Inject 300 mg into the skin every 28 (twenty-eight) days.  Patient not taking: Reported on 08/30/2024   Jeannetta Lonni ORN, MD  Active Self, Pharmacy Records           Med Note LEOBARDO, NICOLE   Wed Aug 24, 2024  6:40 AM) Pt not feeling well, and did not take shot for October  Med List Note LEOBARDO Nat Sola 08/24/24 [id number]): Cosentyx -[facility]            Home Care and Equipment/Supplies: Were Home Health Services Ordered?: No Any new equipment or medical supplies ordered?: No  Functional Questionnaire: Do you need assistance with bathing/showering or dressing?: No Do you need assistance with meal preparation?: No Do you need assistance with eating?: No Do you have difficulty maintaining continence: No Do you need assistance with getting out of bed/getting out of a chair/moving?: No Do you have difficulty managing or taking your medications?: No  Follow up appointments reviewed: PCP Follow-up appointment confirmed?: No (patient will call MD office and schedule appointment today.) MD Provider Line Number:[phone] Given: No Specialist Hospital Follow-up appointment confirmed?: Yes Date of Specialist follow-up appointment?: 09/08/24 Follow-Up Specialty Provider:: Cardiology  Do you need transportation to your follow-up appointment?: No Do you understand care options if your condition(s) worsen?: Yes-patient verbalized understanding  SDOH Interventions Today    Flowsheet Row Most Recent Value  SDOH Interventions   Food  Insecurity Interventions Intervention Not Indicated  Housing Interventions Intervention Not Indicated  Transportation Interventions Intervention Not Indicated, Other (Comment)  [sister drives to appointments]  Utilities Interventions Intervention Not Indicated  Patient reports that he is doing well.  Reports that BP is running normal at home, Range 106-125/75-98.    HR running under 100.     Reviewed with patient low salt diet.  Encouraged patient to continue to self monitor BP at home and call MD for high or low readings. Reviewed importance of PCP follow up. Encouraged patient to take his BP log to PCP follow up. Reviewed transportation provided by his sister and suggested if he has any difficulty to call Medicaid for assistance with transportation. Reviewed call medications and patient is taking his medications as prescribed. Reviewed when to call 911.  Reviewed and offered TOC program and patient declined. Provided my contact information for patient to call me if needed.  Alan Ee, RN, BSN, CEN [employer]- Transition of Care Team.  [facility] [phone]

## 2024-09-05 ENCOUNTER — Inpatient Hospital Stay: Admitting: Family Medicine

## 2024-09-05 ENCOUNTER — Encounter: Payer: Self-pay | Admitting: Family Medicine

## 2024-09-05 ENCOUNTER — Ambulatory Visit (INDEPENDENT_AMBULATORY_CARE_PROVIDER_SITE_OTHER): Admitting: Family Medicine

## 2024-09-05 VITALS — BP 100/70 | HR 88 | Temp 97.7°F | Ht 71.0 in | Wt 199.8 lb

## 2024-09-05 DIAGNOSIS — I2510 Atherosclerotic heart disease of native coronary artery without angina pectoris: Secondary | ICD-10-CM

## 2024-09-05 DIAGNOSIS — E1165 Type 2 diabetes mellitus with hyperglycemia: Secondary | ICD-10-CM | POA: Diagnosis not present

## 2024-09-05 DIAGNOSIS — Z7984 Long term (current) use of oral hypoglycemic drugs: Secondary | ICD-10-CM | POA: Diagnosis not present

## 2024-09-05 DIAGNOSIS — I480 Paroxysmal atrial fibrillation: Secondary | ICD-10-CM

## 2024-09-05 DIAGNOSIS — B3322 Viral myocarditis: Secondary | ICD-10-CM | POA: Diagnosis not present

## 2024-09-05 LAB — POCT GLYCOSYLATED HEMOGLOBIN (HGB A1C): Hemoglobin A1C: 6.6 % — AB (ref 4.0–5.6)

## 2024-09-05 MED ORDER — COLCHICINE 0.6 MG PO TABS: 0.6000 mg | ORAL_TABLET | Freq: Two times a day (BID) | ORAL | 0 refills | Status: AC

## 2024-09-05 MED ORDER — EMPAGLIFLOZIN 25 MG PO TABS: 25.0000 mg | ORAL_TABLET | Freq: Every day | ORAL | 1 refills | Status: AC

## 2024-09-05 MED ORDER — ROSUVASTATIN CALCIUM 20 MG PO TABS: 20.0000 mg | ORAL_TABLET | Freq: Every day | ORAL | 2 refills | Status: AC

## 2024-09-05 MED ORDER — OZEMPIC (0.25 OR 0.5 MG/DOSE) 2 MG/3ML ~~LOC~~ SOPN: PEN_INJECTOR | SUBCUTANEOUS | 5 refills | Status: AC

## 2024-09-05 MED ORDER — APIXABAN 5 MG PO TABS: 5.0000 mg | ORAL_TABLET | Freq: Two times a day (BID) | ORAL | 2 refills | Status: AC

## 2024-09-05 MED ORDER — CLOPIDOGREL BISULFATE 75 MG PO TABS: 75.0000 mg | ORAL_TABLET | Freq: Every day | ORAL | 2 refills | Status: AC

## 2024-09-05 NOTE — Progress Notes (Signed)
 Established Patient Office Visit  Subjective   Patient ID: Jeffrey Sternberg., male    DOB: 03-23-1961  Age: 63 y.o. MRN: [id number]  Chief Complaint  Patient presents with   Hospitalization Follow-up    HPI Discussed the use of AI scribe software for clinical note transcription with the patient, who gave verbal consent to proceed.  History of Present Illness   Jeffrey Mossa. is a 63 year old male with acute myocarditis and coronary artery disease who presents for medication review and management of low blood pressure.  Jeffrey Black has had two recent hospitalizations for cardiovascular issues. On August 07, 2024, Jeffrey Black was hospitalized for low blood pressure and suspected shock, undergoing heart catheterization and stent placement for an ulcerated plaque in the right circumflex artery. MRI showed acute myocarditis, likely a viral origin. Jeffrey Black was discharged after seven days.   On August 23, 2024, Jeffrey Black was hospitalized again for low blood pressure again and Jeffrey Black blood pressure medication, furosemide , and metoprolol  were discontinued. Jeffrey Black has been watching Jeffrey Black blood pressure since coming home and BP has been WNL.   Jeffrey Black experiences epistaxis, which Jeffrey Black associates with metformin  use, and is currently on Jardiance  for diabetes management. Jeffrey Black A1c was last recorded at 6.5 in May 2025, indicating good control of Jeffrey Black diabetes.  A1C today in office is 6.6, stable from previous. We discussed swtiching Jeffrey Black metformin  to Ozempic now that Jeffrey Black has a diagnosis of CAD and we discussed the risks/benefits of this and Jeffrey Black is willing to try it.       Current Outpatient Medications  Medication Instructions   apixaban  (ELIQUIS ) 5 mg, Oral, 2 times daily   clopidogrel  (PLAVIX ) 75 mg, Oral, Daily with breakfast   colchicine  0.6 mg, Oral, 2 times daily   Cosentyx  UnoReady 300 mg, Subcutaneous, Every 28 days   empagliflozin  (JARDIANCE ) 25 mg, Oral, Daily before breakfast   rosuvastatin  (CRESTOR ) 20 mg, Oral, Daily    Semaglutide,0.25 or 0.5MG /DOS, (OZEMPIC, 0.25 OR 0.5 MG/DOSE,) 2 MG/3ML SOPN Inject 0.25 mg into the skin once a week for 30 days, THEN 0.5 mg once a week.    Patient Active Problem List   Diagnosis Date Noted   Coronary artery disease involving native coronary artery of native heart without angina pectoris 08/24/2024   PAF (paroxysmal atrial fibrillation) (HCC) 08/24/2024   Hypotension 08/23/2024   AKI (acute kidney injury) 08/23/2024   Orthostatic hypotension 08/10/2024   Acute myopericarditis 08/10/2024   Myocarditis (HCC) 08/09/2024   Pneumomediastinum (HCC) 08/09/2024   Elevated troponin 08/09/2024   Chest pain of uncertain etiology 08/09/2024   Pericardial effusion 08/09/2024   Atrial fibrillation with RVR (HCC) 08/09/2024   Shock (HCC) 08/07/2024   NSTEMI (non-ST elevated myocardial infarction) (HCC) 08/07/2024   High risk medication use 11/26/2023   Alcohol  use disorder in remission 09/24/2023   Psoriatic arthritis (HCC) 05/19/2023   Hypertension 05/19/2023   Type 2 diabetes mellitus with hyperglycemia, without long-term current use of insulin  (HCC) 02/11/2023   Elevated blood pressure reading 02/11/2023   Psoriasis 07/28/2013   Elevated liver enzymes 02/22/2013   Depression 02/22/2013     Review of Systems  All other systems reviewed and are negative.     Objective:     BP 100/70   Pulse 88   Temp 97.7 F (36.5 C) (Oral)   Ht 5' 11 (1.803 m)   Wt 199 lb 12.8 oz (90.6 kg)   SpO2 98%   BMI 27.87 kg/m  Physical Exam Vitals reviewed.  Constitutional:      Appearance: Normal appearance. Jeffrey Black is well-groomed and normal weight.  Cardiovascular:     Rate and Rhythm: Normal rate and regular rhythm.     Heart sounds: S1 normal and S2 normal. No murmur heard. Pulmonary:     Effort: Pulmonary effort is normal.     Breath sounds: Normal breath sounds and air entry. No rales.  Musculoskeletal:     Right lower leg: No edema.     Left lower leg: No edema.   Neurological:     General: No focal deficit present.     Mental Status: Jeffrey Black is alert and oriented to person, place, and time.     Gait: Gait is intact.  Psychiatric:        Mood and Affect: Mood and affect normal.      Results for orders placed or performed in visit on 09/05/24  POC HgB A1c  Result Value Ref Range   Hemoglobin A1C 6.6 (A) 4.0 - 5.6 %   HbA1c POC (<> result, manual entry)     HbA1c, POC (prediabetic range)     HbA1c, POC (controlled diabetic range)        The ASCVD Risk score (Arnett DK, et al., 2019) failed to calculate for the following reasons:   Risk score cannot be calculated because patient has a medical history suggesting prior/existing ASCVD    Assessment & Plan:  Type 2 diabetes mellitus with hyperglycemia, without long-term current use of insulin  (HCC) -     POCT glycosylated hemoglobin (Hb A1C) -     Ozempic (0.25 or 0.5 MG/DOSE); Inject 0.25 mg into the skin once a week for 30 days, THEN 0.5 mg once a week.  Dispense: 3 mL; Refill: 5 -     Rosuvastatin  Calcium ; Take 1 tablet (20 mg total) by mouth daily.  Dispense: 90 tablet; Refill: 2 -     Empagliflozin ; Take 1 tablet (25 mg total) by mouth daily before breakfast.  Dispense: 90 tablet; Refill: 1  Coronary artery disease involving native coronary artery of native heart without angina pectoris -     Ozempic (0.25 or 0.5 MG/DOSE); Inject 0.25 mg into the skin once a week for 30 days, THEN 0.5 mg once a week.  Dispense: 3 mL; Refill: 5 -     Clopidogrel  Bisulfate; Take 1 tablet (75 mg total) by mouth daily with breakfast.  Dispense: 90 tablet; Refill: 2  Paroxysmal atrial fibrillation (HCC) -     Apixaban ; Take 1 tablet (5 mg total) by mouth 2 (two) times daily.  Dispense: 180 tablet; Refill: 2  Acute viral myocarditis -     Colchicine ; Take 1 tablet (0.6 mg total) by mouth 2 (two) times daily.  Dispense: 180 tablet; Refill: 0   Assessment and Plan    Coronary artery disease with prior stent  placement and recent NSTEMI Recent hospitalization for NSTEMI with stent placement due to ulcerated plaque in the right circumflex artery. Current medications include Eliquis  for anticoagulation. - Continue Eliquis  for anticoagulation - Consider switching from metformin  to Ozempic for dual benefit in diabetes and coronary artery disease management - Check A1c today to guide diabetes management - Discuss potential medication changes with cardiologist  Type 2 diabetes mellitus in the setting of cardiovascular disease Type 2 diabetes with cardiovascular disease. Previous A1c was 6.5, today is 6.6. Current management includes metformin  and Jardiance . - Check A1c today - Will discontinue metformin  and starting Ozempic -  Continue Jardiance  for heart and kidney protection  Acute myocarditis Acute myocarditis diagnosed via MRI with high heart enzymes. Likely viral etiology, possibly Coxsackie virus, leading to systemic infection and shock. Symptoms included low blood pressure, fever, and elevated white blood cell count. Recovery phase ongoing.  Atrial fibrillation Atrial fibrillation occurred during hospitalization, likely secondary to myocarditis and systemic stress. Currently not on metoprolol  as heart rate is within normal range. - Discuss metoprolol  discontinuation with cardiologist        Return in about 4 months (around 01/06/2025) for DM -- A1C recheck.    Heron CHRISTELLA Sharper, MD

## 2024-09-06 ENCOUNTER — Other Ambulatory Visit (HOSPITAL_COMMUNITY): Payer: Self-pay

## 2024-09-06 ENCOUNTER — Telehealth: Payer: Self-pay

## 2024-09-06 NOTE — Telephone Encounter (Signed)
 Pharmacy Patient Advocate Encounter   Received notification from Onbase that prior authorization for Ozempic 2 is required/requested.   Insurance verification completed.   The patient is insured through [facility].   Per test claim: PA required; PA submitted to above mentioned insurance via Latent Key/confirmation #/EOC [id number] Status is pending

## 2024-09-06 NOTE — Telephone Encounter (Signed)
 Pharmacy Patient Advocate Encounter  Received notification from [employer] that Prior Authorization for Ozempic 2 has been APPROVED from 09/06/24 to 09/06/25. Ran test claim, Copay is $4.00. This test claim was processed through [facility]- copay amounts may vary at other pharmacies due to pharmacy/plan contracts, or as the patient moves through the different stages of their insurance plan.   PA #/Case ID/Reference #: # [id number]

## 2024-09-06 NOTE — Telephone Encounter (Signed)
 Left a detailed message with the approval information below on the voicemail at [facility].

## 2024-09-07 ENCOUNTER — Ambulatory Visit: Attending: Internal Medicine | Admitting: Internal Medicine

## 2024-09-07 ENCOUNTER — Encounter: Payer: Self-pay | Admitting: Internal Medicine

## 2024-09-07 ENCOUNTER — Encounter: Payer: Self-pay | Admitting: *Deleted

## 2024-09-07 ENCOUNTER — Other Ambulatory Visit: Payer: Self-pay

## 2024-09-07 ENCOUNTER — Other Ambulatory Visit (HOSPITAL_COMMUNITY): Payer: Self-pay

## 2024-09-07 VITALS — BP 136/97 | HR 105 | Temp 97.7°F | Resp 14 | Ht 71.5 in | Wt 200.6 lb

## 2024-09-07 DIAGNOSIS — L4050 Arthropathic psoriasis, unspecified: Secondary | ICD-10-CM | POA: Insufficient documentation

## 2024-09-07 DIAGNOSIS — Z79899 Other long term (current) drug therapy: Secondary | ICD-10-CM | POA: Insufficient documentation

## 2024-09-07 DIAGNOSIS — L409 Psoriasis, unspecified: Secondary | ICD-10-CM | POA: Insufficient documentation

## 2024-09-07 MED ORDER — COSENTYX UNOREADY 300 MG/2ML ~~LOC~~ SOAJ
300.0000 mg | SUBCUTANEOUS | 2 refills | Status: DC
  Filled 2024-09-07: qty 2, 28d supply, fill #0
  Filled 2024-10-20: qty 2, 28d supply, fill #1
  Filled 2024-11-16: qty 2, 28d supply, fill #2

## 2024-09-08 ENCOUNTER — Telehealth (HOSPITAL_COMMUNITY): Payer: Self-pay

## 2024-09-08 ENCOUNTER — Encounter: Payer: Self-pay | Admitting: Emergency Medicine

## 2024-09-08 ENCOUNTER — Ambulatory Visit: Attending: Emergency Medicine | Admitting: Emergency Medicine

## 2024-09-08 VITALS — BP 110/76 | HR 103 | Ht 70.5 in | Wt 200.0 lb

## 2024-09-08 DIAGNOSIS — I480 Paroxysmal atrial fibrillation: Secondary | ICD-10-CM | POA: Diagnosis not present

## 2024-09-08 DIAGNOSIS — Z720 Tobacco use: Secondary | ICD-10-CM | POA: Diagnosis not present

## 2024-09-08 DIAGNOSIS — E785 Hyperlipidemia, unspecified: Secondary | ICD-10-CM | POA: Insufficient documentation

## 2024-09-08 DIAGNOSIS — E1165 Type 2 diabetes mellitus with hyperglycemia: Secondary | ICD-10-CM | POA: Insufficient documentation

## 2024-09-08 DIAGNOSIS — I3139 Other pericardial effusion (noninflammatory): Secondary | ICD-10-CM | POA: Insufficient documentation

## 2024-09-08 DIAGNOSIS — I959 Hypotension, unspecified: Secondary | ICD-10-CM | POA: Insufficient documentation

## 2024-09-08 DIAGNOSIS — I2510 Atherosclerotic heart disease of native coronary artery without angina pectoris: Secondary | ICD-10-CM | POA: Insufficient documentation

## 2024-09-08 DIAGNOSIS — I409 Acute myocarditis, unspecified: Secondary | ICD-10-CM | POA: Diagnosis not present

## 2024-09-08 DIAGNOSIS — I5032 Chronic diastolic (congestive) heart failure: Secondary | ICD-10-CM | POA: Insufficient documentation

## 2024-09-08 MED ORDER — METOPROLOL SUCCINATE ER 25 MG PO TB24: 25.0000 mg | ORAL_TABLET | Freq: Every day | ORAL | 3 refills | Status: AC

## 2024-09-08 NOTE — Patient Instructions (Addendum)
 Medication Instructions:  START BACK TAKING METOPROLOL  SUCCINATE 25 MG DAILY.  Lab Work: Nutritional therapist AND CBC TO BE DONE TODAY.  Testing/Procedures: TO BE DONE IN 3 MONTHS  Your physician has requested that you have an echocardiogram. Echocardiography is a painless test that uses sound waves to create images of your heart. It provides your doctor with information about the size and shape of your heart and how well your heart's chambers and valves are working. This procedure takes approximately one hour. There are no restrictions for this procedure. Please do NOT wear cologne, perfume, aftershave, or lotions (deodorant is allowed). Please arrive 15 minutes prior to your appointment time.  Please note: We ask at that you not bring children with you during ultrasound (echo/ vascular) testing. Due to room size and safety concerns, children are not allowed in the ultrasound rooms during exams. Our front office staff cannot provide observation of children in our lobby area while testing is being conducted. An adult accompanying a patient to their appointment will only be allowed in the ultrasound room at the discretion of the ultrasound technician under special circumstances. We apologize for any inconvenience.   Follow-Up: At [facility], you and your health needs are our priority.  As part of our continuing mission to provide you with exceptional heart care, our providers are all part of one team.  This team includes your primary Cardiologist (physician) and Advanced Practice Providers or APPs (Physician Assistants and Nurse Practitioners) who all work together to provide you with the care you need, when you need it.  Your next appointment:   1 WEEK POST ECHOCARDIOGRAM  Provider:   Wilbert Bihari, MD

## 2024-09-08 NOTE — Telephone Encounter (Signed)
 Pt unable to participate in the cardiac rehab program due to activity limitations per medicaid form from cardiologist office.   Closed referral

## 2024-09-08 NOTE — Progress Notes (Signed)
 Cardiology Office Note:    Date:  09/08/2024  ID:  Jeffrey Black [name], DOB 1961/04/26, MRN [id number] PCP: [name], MD  [facility] Providers Cardiologist:  Wilbert Bihari, MD Cardiology APP:  Rana Lum LITTIE, NP       Patient Profile:       Chief Complaint: Hospital follow-up History of Present Illness:  Jeffrey Black. is a 63 y.o. male with visit-pertinent history of type 2 diabetes, alcohol  abuse, paroxysmal atrial fibrillation, diastolic heart failure, myocarditis, coronary disease s/p PCI, psoriatic arthritis, pericardial effusion  On 08/07/2024 the patient presented to the emergency department complaining of chest pain and syncope.  On arrival he was hypotensive with systolic blood pressures in the 60s.  He was started on Levophed  and blood pressure improved.  CT imaging showed ground glass opacity in the left lower lobe and a small pneumomediastinum.  High-sensitivity troponins were 1350, 1336.  White count was elevated at 17.7 and lactic acid of 2.4.  Cardiology was consulted and felt the etiology of the shock was infectious rather than cardiogenic.  He underwent TTE which showed normal LVEF of 55 to 60%, moderate concentric LVH, moderately reduced RV systolic function, small to moderate pericardial effusion, and IVC with less than 50% respiratory variability.  On 08/09/2024 he went into new onset atrial fibrillation with RVR and was started on amiodarone .  A cardiac MRI was done and showed a small infarct on the apical inferior wall, moderate pericardial effusion of 1.5 cm, dilated ascending aorta 40 mm, and possible myocarditis.  Liver did echo was done but showed no change in the pericardial effusion.  Because of concerns for myocarditis the patient was started on colchicine .  On 08/10/2024 the left heart catheterization was completed showing mild nonobstructive CAD in the LAD and D2, 65% stenosis in the distal RCA.  A drug-eluting stent was placed to the distal  RCA.  The patient was placed on triple therapy with Eliquis , aspirin , and Plavix  for a month.  Following the patient was recommended to remain on Plavix  for 6 months.  Because the cath also showed an elevated LVEDP he was started on additional diuresis with IV Lasix .  He was seen for outpatient follow-up on 08/23/2024.  At the visit he appeared acutely ill and had a blood pressure of 70/40 with worsening shortness of breath.  Because of concerns he was recommended to go to the ED.  Upon arrival to the ED patient's blood pressure did improve slightly at 101/60.  Pulmonary CTA was done and showed moderate pericardial effusion that was worse from prior imaging, no evidence of PE, no evidence of aortic aneurysm or dissection, no evidence of acute pulmonary disease and small hiatal hernia.  Patient was seen on 08/24/2024 in the hospital for evaluation of hypotension and pericardial effusion.  Patient reported he fell 3 times during the week and felt diaphoretic, lightheaded and dizzy.  Reported blood pressures at home to be in the 70s.  The patient reported that he had been on Benicar  losartan  at home.  In addition to taking 2 arms the patient was also on metoprolol  and Lasix .  The patient's losartan  Benicar  were held as well as his metoprolol .  Hypotension was thought to be in the setting of polypharmacy.  Echocardiogram on 10/8 with LVEF 50 to 55%, no RWMA, low normal RV function, moderate pericardial effusion with no evidence of cardiac tamponade.  He was continued on colchicine  0.6 mg twice daily.  Regards to his CAD  aspirin  was stopped and he was continued on Plavix  along with Eliquis  as well as the statin.   Discussed the use of AI scribe software for clinical note transcription with the patient, who gave verbal consent to proceed.  History of Present Illness Jeffrey Eberlein. is a 63 year old male with myocarditis, pericardial effusion, and atrial fibrillation who presents for follow-up after  hospitalization. He is accompanied by his sister, Jeffrey Black.  Today patient is doing well.  He currently feels back to normal, similar to six months ago, but notes an elevated heart rate since some his metoprolol  was discontinued. He remains adherent to his current medication regimen.  He denies any further episodes of hypotension.  Blood pressure log reviewed in office showing no hypotensive readings.  He is without chest pains, dyspnea, orthopnea, PND, LEE, syncope, presyncope.  No hematochezia or melena.  He lives alone, which limits his ability to attend cardiac rehab as he does not have a driver's license.  He has reduced smoking to one cigarette a day and is working towards quitting entirely.   Review of systems:  Please see the history of present illness. All other systems are reviewed and otherwise negative.      Studies Reviewed:        Echocardiogram Limited 08/24/2024  1. Left ventricular ejection fraction, by estimation, is 50 to 55%. The  left ventricle has low normal function. The left ventricle has no regional  wall motion abnormalities.   2. Right ventricular systolic function is low normal. The right  ventricular size is moderately enlarged.   3. Moderate pericardial effusion. The pericardial effusion is  circumferential. There is no evidence of cardiac tamponade.   4. The mitral valve is normal in structure. Trivial mitral valve  regurgitation.   5. The aortic valve is tricuspid. Aortic valve regurgitation is not  visualized. Aortic valve sclerosis/calcification is present, without any  evidence of aortic stenosis.   Cardiac catheterization 08/10/2024   Likely culprit lesion: Dist RCA lesion is 65% stenosed (focal ulcerated plaque) with 10% stenosed RPA V side branch in RPAV.   A drug-eluting stent was successfully placed from a branch into the RPA V sidebranch (across the RPDA) using a STENT SYNERGY XD [id number] in the main branch and side branch.  Stent was deployed at  3.8 mm and postdilated to 4.1 mm in the main branch.   Post intervention, there is a 0% residual stenosis. Post intervention, the side branch was reduced to 0% residual stenosis.  TIMI-3 flow restored.   --------------------------------------------------------------------------------   Mid LAD lesion is 20% stenosed with 20% stenosed side branch in 2nd Diag.   --------------------------------------------------------------------------------   LV end diastolic pressure is severely elevated.   There is no aortic valve stenosis. Diagnostic Dominance: Right  Intervention    Cardiac MRI 08/09/2024 1. Cine images were not interpretable due to artifact from cardiac/respiratory motion. Unable to calculate LV/RV volumes and EF   2. Subendocardial LGE in apical inferior wall consistent with small infarct   3. Elevated myocardial T1/T2/ECV values consistent with myocardial edema   4. Moderate pericardial effusion measuring up to 1.5cm adjacent to LV inferior wall   5.  Dilated ascending aorta measuring 40mm   6. Dilated main pulmonary artery measuring 34mm. RV insertion site LGE, which can be seen in setting of elevated pulmonary pressures   7. Clinical presentation suggests myocarditis, and meets criteria for acute myocarditis on CMR with regional elevation in T1/T2/ECV levels. However, given LGE imaging shows  evidence of small infarct in apical inferior wall, would be important to clarify if this is an acute or old infarct. Recommend cardiac catheterization. Given elevated T1/T2 levels are not just located around the infarct, but in multiple regions throughout the LV, suspect more likely myocarditis but important to rule out acute infarct  Echocardiogram limited 08/09/2024  1. Left ventricular ejection fraction, by estimation, is 50 to 55%. The  left ventricle has low normal function. There is mild concentric left  ventricular hypertrophy.   2. Right ventricular systolic function is  mildly reduced. Tricuspid  regurgitation signal is inadequate for assessing PA pressure.   3. Moderate pericardial effusion. The pericardial effusion is  circumferential. There is no evidence of cardiac tamponade.   4. The inferior vena cava is dilated in size with <50% respiratory  variability, suggesting right atrial pressure of 20 mmHg.   Echocardiogram 08/08/2024  1. Left ventricular ejection fraction, by estimation, is 55 to 60%. The  left ventricle has normal function. The left ventricle has no regional  wall motion abnormalities. There is moderate concentric left ventricular  hypertrophy. Left ventricular  diastolic parameters are consistent with Grade I diastolic dysfunction  (impaired relaxation).   2. Right ventricular systolic function is mildly reduced. The right  ventricular size is normal. Tricuspid regurgitation signal is inadequate  for assessing PA pressure.   3. The mitral valve is normal in structure. Trivial mitral valve  regurgitation. No evidence of mitral stenosis.   4. The aortic valve is tricuspid. There is moderate calcification of the  aortic valve. Aortic valve regurgitation is not visualized. Aortic valve  sclerosis/calcification is present, without any evidence of aortic  stenosis.   5. Aortic dilatation noted. There is borderline dilatation of the aortic  root, measuring 38 mm.   6. The inferior vena cava is dilated in size with <50% respiratory  variability, suggesting right atrial pressure of 15 mmHg.   7. Small to moderate circumferential pericardial effusion. The IVC is  dilated but there is no RV diastolic collapse. There is < 25%  respirophasic variation of mitral inflow E wave. There does not appear to  be pericardial tamponade.  Risk Assessment/Calculations:    CHA2DS2-VASc Score = 4   This indicates a 4.8% annual risk of stroke. The patient's score is based upon: CHF History: 1 HTN History: 1 Diabetes History: 1 Stroke History: 0 Vascular  Disease History: 1 Age Score: 0 Gender Score: 0             Physical Exam:   VS:  BP 110/76 (BP Location: Right Arm, Patient Position: Sitting, Cuff Size: Normal)   Pulse (!) 103   Ht 5' 10.5 (1.791 m)   Wt 200 lb (90.7 kg)   SpO2 97%   BMI 28.29 kg/m    Wt Readings from Last 3 Encounters:  09/08/24 200 lb (90.7 kg)  09/07/24 200 lb 9.6 oz (91 kg)  09/05/24 199 lb 12.8 oz (90.6 kg)    GEN: Well nourished, well developed in no acute distress NECK: No JVD; No carotid bruits CARDIAC: RRR, no murmurs, rubs, gallops RESPIRATORY:  Clear to auscultation without rales, wheezing or rhonchi  ABDOMEN: Soft, non-tender, non-distended EXTREMITIES:  No edema; No acute deformity      Assessment and Plan:  Myocarditis Pericardial effusion Admitted 07/2024 for chest pain where he was found to have small to moderate pericardial effusion as well as MRI indicated of myocarditis Echocardiogram 07/2024 with LVEF 55 to 60%, moderate LVH, grade 1  DD, mild RV dysfunction and small to moderate pericardial effusion cMRI showed SE LGE in apical inferior wall c/w small infarct but also evidence of myocardial edema and moderate pericardial effusion>>meets criteria for acute myocarditis  Echocardiogram 08/2024 with stable moderate pericardial effusion and no evidence of cardiac tamponade - Quiescent - Today he is stable and no longer with pleuritic chest pains - Continue colchicine  0.6 mg twice daily 90 days then discontinue - Repeat echocardiogram x 3 months to reassess pericardial effusion  Hypotension Admitted 08/2024 for hypotension with blood pressure 70/40 on admission, suspected in the setting of polypharmacy.  He was taking 2 ARB's, beta-blocker, and furosemide .  Losartan , Benicar , furosemide , and metoprolol  were all discontinued - Blood pressure today is stable and well-controlled at 110/76 - Home blood pressure log shows no further evidence of hypotension - Will add metoprolol  succinate 25 mg  daily to regimen as his home heart rate log shows average heart rate in the low 100s - Continue home BP monitoring  Coronary artery disease NSTEMI S/p PCI to RCA on 07/2024  [facility] 07/2024 showing 65% distal LAD with 10% ostial R PLA status post PCI of the distal RCA with residual stenosis of 20% mid LAD and ostial D2>>felt that RCA lesion/infarct on MRI were incidental findings and myocardits because of troponin elevation Placed on triple therapy with Eliquis , aspirin  and Plavix  x 1 month - Now off of aspirin  and managed on Eliquis  and Plavix  - Ok to stop Plavix  if needed after 6 months and continue with DOAC for PAF  - Today patient is stable without chest pains.  Has gradually returned to moderate physical exercise without exertional symptoms.  No symptoms to suggest active angina, no indication further ischemic evaluation at this time - Continue Eliquis  5 mg twice daily - Continue Plavix  75 mg daily - Start metoprolol  succinate 25 mg daily - Continue rosuvastatin  20 mg daily - He is not interested in cardiac rehab   Paroxysmal atrial fibrillation Found to be in PAF with RVR during admission on 07/2025.  Started on amiodarone  drip but discontinued due to QTc prolongation - Today he appears to maintain sinus rhythm per auscultation - He brings in his home heart rate log showing average heart rate in the low 100s.  Heart rate today in office is 103 bpm - He denies any palpitations or symptoms concerning for recurrent atrial fibrillation - Therefore given his sinus tachycardia will restart his metoprolol  succinate 25 mg daily - I suspect his atrial fibrillation was related to myocarditis only.  Can consider ZIO monitor at follow-up visit to reassess A-fib burden and reassess need for ongoing anticoagulation - Continue Eliquis  5 mg twice daily, no bleeding concerns - Start metoprolol  succinate 25 mg daily  Diastolic CHF LHC 0/7974 with severely elevated LVEDP CXR 07/2024 with interstitial  edema Echocardiogram 08/2024 with LVEF 50 to 55%, no RWMA - Today patient appears euvolemic and well compensated on exam.  Not currently requiring loop diuretic therapy.  Furosemide  previously discontinued in the setting of hypotension - Start metoprolol  succinate 25 mg daily as noted above - Continue Jardiance  25 mg daily (h/o T2DM)  Hyperlipidemia LDL 60 on 03/2024 - Continue rosuvastatin  20 mg daily  Tobacco use He is now down to 1 cigarette daily - Tobacco cessation encouraged  T2DM A1c 6.6% on 08/2024 and fairly controlled - Managed on semaglutide and Jardiance       Dispo:  Return in about 3 months (around 12/09/2024).  Signed, Lum Black Louis, NP

## 2024-09-09 ENCOUNTER — Other Ambulatory Visit: Payer: Self-pay

## 2024-09-09 ENCOUNTER — Telehealth: Payer: Self-pay | Admitting: Family Medicine

## 2024-09-09 DIAGNOSIS — E1165 Type 2 diabetes mellitus with hyperglycemia: Secondary | ICD-10-CM

## 2024-09-09 LAB — BASIC METABOLIC PANEL WITH GFR
BUN/Creatinine Ratio: 12 (ref 10–24)
BUN: 9 mg/dL (ref 8–27)
CO2: 22 mmol/L (ref 20–29)
Calcium: 9.5 mg/dL (ref 8.6–10.2)
Chloride: 102 mmol/L (ref 96–106)
Creatinine, Ser: 0.73 mg/dL — ABNORMAL LOW (ref 0.76–1.27)
Glucose: 83 mg/dL (ref 70–99)
Potassium: 4.5 mmol/L (ref 3.5–5.2)
Sodium: 139 mmol/L (ref 134–144)
eGFR: 102 mL/min/1.73 (ref >59)

## 2024-09-09 LAB — CBC
Hematocrit: 39.6 % (ref 37.5–51.0)
Hemoglobin: 13.1 g/dL (ref 13.0–17.7)
MCH: 29 pg (ref 26.6–33.0)
MCHC: 33.1 g/dL (ref 31.5–35.7)
MCV: 88 fL (ref 79–97)
Platelets: 243 x10E3/uL (ref 150–450)
RBC: 4.51 x10E6/uL (ref 4.14–5.80)
RDW: 14.1 % (ref 11.6–15.4)
WBC: 6.5 x10E3/uL (ref 3.4–10.8)

## 2024-09-09 NOTE — Progress Notes (Signed)
 Specialty Pharmacy Refill Coordination Note  Jeffrey Black. is a 63 y.o. male contacted today regarding refills of specialty medication(s) Secukinumab  (Cosentyx  UnoReady)   Patient requested Delivery   Delivery date: 09/23/24   Verified address: [address] [place] [address]   Medication will be filled on 09/22/24.

## 2024-09-09 NOTE — Telephone Encounter (Signed)
 Copied from CRM [id number]. Topic: Referral - Request for Referral >> Sep 08, 2024  4:36 PM Viola F wrote: Did the patient discuss referral with their provider in the last year? Yes (If No - schedule appointment) (If Yes - send message)  Appointment offered? No  Type of order/referral and detailed reason for visit: prosate problems for Urology and Diabetic Eye Doctor   Preference of office, provider, location:N/A  If referral order, have you been seen by this specialty before? No (If Yes, this issue or another issue? When? Where?  Can we respond through MyChart? Yes

## 2024-09-12 ENCOUNTER — Ambulatory Visit: Payer: Self-pay | Admitting: Emergency Medicine

## 2024-09-13 NOTE — Telephone Encounter (Signed)
 Ok to place referral for diabetic eye exam, I'm not sure what the urology referral is for- we did not discuss this at the last visit. Can you inquire as to why he thinks he needs this? Thanks!

## 2024-09-13 NOTE — Telephone Encounter (Signed)
Duplicate message please close

## 2024-09-13 NOTE — Telephone Encounter (Signed)
 Patient informed of the message below and is aware the referral will be placed and someone will contact him with further information.  States he has had a decreased urine stream since Feb 2025 and last physician that reviewed his CT-exact name unknown- told him he should see a urologist.   Message sent to PCP.

## 2024-09-14 NOTE — Progress Notes (Signed)
 Seen via [employer]

## 2024-09-14 NOTE — Telephone Encounter (Signed)
 I think it is because his blood sugars have been under control since february and he is urinating less because of this-- PSA was just checked and was normal so I do not think he needs to see a urologist. Kidney function is also normal.

## 2024-09-15 NOTE — Telephone Encounter (Signed)
 Left a message for the patient to return my call.

## 2024-09-16 NOTE — Telephone Encounter (Signed)
 Patient informed of the message below and stated he will discuss further at the next office visit next year.

## 2024-09-19 ENCOUNTER — Telehealth: Payer: Self-pay | Admitting: Emergency Medicine

## 2024-09-19 NOTE — Telephone Encounter (Signed)
Patient returned call for lab results.  

## 2024-09-22 ENCOUNTER — Other Ambulatory Visit: Payer: Self-pay

## 2024-09-29 ENCOUNTER — Ambulatory Visit: Admitting: Family Medicine

## 2024-09-30 ENCOUNTER — Other Ambulatory Visit: Payer: Self-pay

## 2024-10-11 ENCOUNTER — Other Ambulatory Visit: Payer: Self-pay

## 2024-10-11 NOTE — Progress Notes (Signed)
 Specialty Pharmacy Ongoing Clinical Assessment Note  Jeffrey Black. is a 63 y.o. male who is being followed by the specialty pharmacy service for RxSp Psoriatic Arthritis   Patient's specialty medication(s) reviewed today: Secukinumab  (Cosentyx  UnoReady)   Missed doses in the last 4 weeks: 0   Patient/Caregiver did not have any additional questions or concerns.   Therapeutic benefit summary: Patient is achieving benefit   Adverse events/side effects summary: No adverse events/side effects   Patient's therapy is appropriate to: Continue    Goals Addressed             This Visit's Progress    Maintain optimal adherence to therapy       Patient is on track. Patient will maintain adherence          Follow up: 12 months  Katarina Riebe M Jeffrey Black Specialty Pharmacist

## 2024-10-12 ENCOUNTER — Other Ambulatory Visit (HOSPITAL_COMMUNITY): Payer: Self-pay

## 2024-10-20 ENCOUNTER — Other Ambulatory Visit: Payer: Self-pay

## 2024-10-24 ENCOUNTER — Other Ambulatory Visit: Payer: Self-pay

## 2024-10-24 ENCOUNTER — Other Ambulatory Visit: Payer: Self-pay | Admitting: Pharmacy Technician

## 2024-10-24 NOTE — Progress Notes (Signed)
 Specialty Pharmacy Refill Coordination Note  Jeffrey Black. is a 63 y.o. male contacted today regarding refills of specialty medication(s) Secukinumab  (Cosentyx  UnoReady)   Patient requested Delivery   Delivery date: 10/25/24   Verified address: [address]  [place]   Medication will be filled on: 10/24/24

## 2024-10-25 ENCOUNTER — Ambulatory Visit (HOSPITAL_COMMUNITY)

## 2024-11-14 ENCOUNTER — Other Ambulatory Visit (HOSPITAL_COMMUNITY): Payer: Self-pay

## 2024-11-15 ENCOUNTER — Telehealth: Payer: Self-pay

## 2024-11-15 NOTE — Telephone Encounter (Signed)
 Received notification from [facility] MEDICAID regarding a prior authorization for COSENTYX  SQ. Authorization has been APPROVED from 11/15/2024 to 11/15/2025. Approval letter sent to scan center.  Authorization # [id number]  Sherry Pennant, PharmD, MPH, BCPS, CPP Clinical Pharmacist

## 2024-11-15 NOTE — Telephone Encounter (Signed)
 Submitted a Prior Authorization renewal request to [facility] MEDICAID for COSENTYX  SQ via CoverMyMeds. Will update once we receive a response.  Key: [id number]

## 2024-11-16 ENCOUNTER — Other Ambulatory Visit: Payer: Self-pay

## 2024-11-18 ENCOUNTER — Other Ambulatory Visit: Payer: Self-pay

## 2024-11-18 NOTE — Progress Notes (Signed)
 Specialty Pharmacy Refill Coordination Note  Makhari Dovidio. is a 64 y.o. male contacted today regarding refills of specialty medication(s) Secukinumab  (Cosentyx  UnoReady)   Patient requested Delivery   Delivery date: 11/22/24   Verified address: [address]  [place]   Medication will be filled on: 11/21/24

## 2024-11-21 ENCOUNTER — Other Ambulatory Visit: Payer: Self-pay

## 2024-11-29 NOTE — Assessment & Plan Note (Signed)
 SABRA

## 2024-11-29 NOTE — Progress Notes (Unsigned)
 "  Office Visit Note  Patient: Jeffrey Black.             Date of Birth: Aug 16, 1961           MRN: [id number]             PCP: [name], MD Referring: [name], MD Visit Date: 12/13/2024   Subjective:  No chief complaint on file.   History of Present Illness: Jeffrey Black. is a 64 y.o. male here for follow up with psoriasis and psoriatic arthritis who presents for follow-up on Cosentyx  300 mg [place] monthly.    Previous HPI 09/07/2024 Jeffrey Black. is a 64 y.o. male here for follow up with psoriasis and psoriatic arthritis who presents for follow-up on Cosentyx  300 mg [place] monthly.    He was recently hospitalized due to a viral infection that led to a significant increase in his white blood cell count and a decrease in red blood cells. During his hospital stay, he developed atrial fibrillation with rapid ventricular response on the third day, requiring intensive care for two to three days. His blood pressure was critically low at one point, recorded at greater than 50/25 mmHg, necessitating intensive care. He was told that there was a 65% blockage in the right circumflex artery this was treated with stenting. Post-hospitalization, he experienced an episode of low blood pressure (70/50 mmHg) while at a heart care clinic, leading to another ambulance call. He has been on hold for three weeks to allow his body to recover from the infection and the heart scan surgery.   The cosentyx  has significantly improved his condition, reducing symptoms in his knees and shoulders. However, he noticed a recurrence of symptoms on his face during interruption of his injections, which he describes as a critical area for him. He has not experienced any issues with Cosentyx  in the past six months.   He is currently on multiple medications, which he feels may have contributed to his low blood pressure episode. He is monitoring his blood pressure and heart rate closely, noting  fluctuations in both. His heart rate was recorded at 124 beats per minute in the hospital, with blood pressure readings of 120/75 mmHg.       Previous HPI 06/01/2024 Jeffrey Black. is a 64 y.o. male here for follow up with psoriasis and psoriatic arthritis who presents for follow-up on Cosentyx  300 mg [place] monthly.   He has experienced significant improvement in his psoriasis symptoms since starting Cosentyx . The condition is now described as 'very livable', with reduced severity and no longer affecting his feet. During the initial four weeks of treatment with weekly injections, there was rapid improvement. Currently, the psoriasis is mostly cleared, with only some residual redness and no scaling.   He denies using any over-the-counter anti-inflammatory medications such as ibuprofen , Motrin , Advil , or Aleve . The psoriasis has cleared from his face, where it was previously present. He mentions a small rash that appeared but has since resolved. No rash around the injection site and no side effects or illness since starting the injections.   He does not use any topical treatments like lotions or creams for his psoriasis, although he recalls using a tube of medication for his face in the past. He is open to using triamcinolone  for any remaining mild areas if needed.   He mentions a positive result from a colon guard test screening without associated symptoms.   No soreness or  restriction in shoulder movement, and no trouble sleeping due to joint issues.        Previous HPI 02/24/2024 Jeffrey Black. is a 64 year old male with psoriasis and psoriatic arthritis who presents for follow-up on Cosentyx  treatment after starting first dose 01/12/24.   He has experienced significant improvement in his symptoms since starting Cosentyx . Joint pain resolved approximately ten days after each injection, and he has not experienced any joint pain since then. He describes this as the 'biggest thing' and is  pleased with the outcome.   Regarding his psoriasis, most of the plaques have cleared, although some red spots remain. These plaques have been present for eighteen years, and he is surprised by the improvement. His fingernails, which used to be ridged, have also improved.   He experiences a runny nose for about four days following each Cosentyx  injection, but this is the only side effect he reports.   He mentions a past diagnosis of fatty liver. His ALT and AST levels were previously elevated due to alcohol  use, but they have normalized since he stopped drinking ten years ago. He is concerned about potential liver effects from Cosentyx .   He inquires about the Shingrix vaccine, confirming it is safe to take with Cosentyx . He expresses concern about shingles due to his father's history with the condition.      Previous HPI 11/26/23 Jeffrey Black. is a 64 y.o. male here for evaluation and management of psoriasis and psoriatic arthritis.  He has a history of psoriasis diagnosed in 2004, presents with worsening skin and joint symptoms. They report a significant flare-up of their psoriasis in 2007, which was managed by a dermatologist. However, due to insurance limitations, they were unable to access biologic therapies such as Stelara, Humira, and Enbrel. Instead, they were treated with methotrexate and topical treatments.   In 2010, the patient began experiencing joint pain, initially in the right shoulder, which later migrated to the left shoulder. The pain was intermittent, lasting for several weeks at a time before subsiding. The patient reports that the joint pain disappeared for a period of approximately eight years, from 2016 to 2024, but has recently returned. The pain is now localized in the right knee and a finger, with no visible swelling noted.   The patient's psoriasis has also worsened over time, with the rash now covering the torso, buttocks, and thighs. Recently, the patient has  noticed the rash appearing on their face, which is a new development in the past six months. The patient reports that the severity of the rash and joint pain seems to fluctuate in tandem.   The patient has been managing their symptoms with over-the-counter treatments and a topical medication, Taclonex, but reports that these treatments only provide temporary relief. The patient has not seen a dermatologist recently and is not currently on any prescribed medication for arthritis.   The patient has a history of heavy alcohol  use, which resulted in a diagnosis of fatty liver in 2014. They have been abstinent from alcohol  for the past ten years. The patient has no history of joint injuries, surgeries, or gout. They have no family history of autoimmune conditions, although their mother and sister both had scalp psoriasis.   The patient lives alone and is concerned about the potential impact of their worsening symptoms on their ability to maintain independence. They are seeking treatment options to manage their psoriasis and joint pain.    No Rheumatology ROS completed.  PMFS History:  Patient Active Problem List   Diagnosis Date Noted   Coronary artery disease involving native coronary artery of native heart without angina pectoris 08/24/2024   PAF (paroxysmal atrial fibrillation) (HCC) 08/24/2024   Hypotension 08/23/2024   AKI (acute kidney injury) 08/23/2024   Orthostatic hypotension 08/10/2024   Acute myopericarditis 08/10/2024   Myocarditis (HCC) 08/09/2024   Pneumomediastinum (HCC) 08/09/2024   Elevated troponin 08/09/2024   Chest pain of uncertain etiology 08/09/2024   Pericardial effusion 08/09/2024   Atrial fibrillation with RVR (HCC) 08/09/2024   Shock (HCC) 08/07/2024   NSTEMI (non-ST elevated myocardial infarction) (HCC) 08/07/2024   High risk medication use 11/26/2023   Alcohol  use disorder in remission 09/24/2023   Psoriatic arthritis (HCC) 05/19/2023   Hypertension 05/19/2023    Type 2 diabetes mellitus with hyperglycemia, without long-term current use of insulin  (HCC) 02/11/2023   Elevated blood pressure reading 02/11/2023   Psoriasis 07/28/2013   Elevated liver enzymes 02/22/2013   Depression 02/22/2013    Past Medical History:  Diagnosis Date   Depression    Diabetes (HCC)    ETOH abuse    History of bronchitis    Hypertension    Psoriasis    Substance abuse (HCC)     Family History  Problem Relation Age of Onset   Colon polyps Mother    Diabetes Mother    Diabetes type II Mother    Kidney disease Father    Hypertension Father    Hyperlipidemia Father    Heart disease Father    Stroke Father    CVA Father    CVA Maternal Grandmother    Hypertension Maternal Grandmother    Hyperlipidemia Maternal Grandmother    Heart disease Maternal Grandmother    Diabetes Maternal Grandmother    Hypertension Maternal Grandfather    Hyperlipidemia Maternal Grandfather    Heart disease Maternal Grandfather    Diabetes Maternal Grandfather    Hyperlipidemia Paternal Grandmother    Heart disease Paternal Grandmother    Hyperlipidemia Paternal Grandfather    Heart disease Paternal Grandfather    Diabetes Paternal Grandfather    Depression Paternal Grandfather    Cancer - Colon Other    Colon cancer Neg Hx    Esophageal cancer Neg Hx    Rectal cancer Neg Hx    Stomach cancer Neg Hx    Past Surgical History:  Procedure Laterality Date   CORONARY STENT INTERVENTION N/A 08/10/2024   Procedure: CORONARY STENT INTERVENTION;  Surgeon: Anner Alm ORN, MD;  Location: [facility];  Service: Cardiovascular;  Laterality: N/A;   ESOPHAGOGASTRODUODENOSCOPY N/A 05/03/2013   Procedure: ESOPHAGOGASTRODUODENOSCOPY (EGD);  Surgeon: [name], MD;  Location: [facility];  Service: Endoscopy;  Laterality: N/A;   left 4th finger surgery     LEFT HEART CATH AND CORONARY ANGIOGRAPHY N/A 08/10/2024   Procedure: LEFT HEART CATH AND CORONARY ANGIOGRAPHY;  Surgeon:  Anner Alm ORN, MD;  Location: [facility];  Service: Cardiovascular;  Laterality: N/A;   Social History   Social History Narrative   Not on file   Immunization History  Administered Date(s) Administered   Influenza, Seasonal, Injecte, Preservative Fre 08/14/2024   PFIZER(Purple Top)SARS-COV-2 Vaccination 01/13/2020, 09/11/2020   Pneumococcal Polysaccharide-23 07/24/2013   Tdap 11/08/2012     Objective: Vital Signs: There were no vitals taken for this visit.   Physical Exam   Musculoskeletal Exam: ***   Investigation: No additional findings.  Imaging: No results found.  Recent Labs: Lab  Results  Component Value Date   WBC 6.5 09/08/2024   HGB 13.1 09/08/2024   PLT 243 09/08/2024   NA 139 09/08/2024   K 4.5 09/08/2024   CL 102 09/08/2024   CO2 22 09/08/2024   GLUCOSE 83 09/08/2024   BUN 9 09/08/2024   CREATININE 0.73 (L) 09/08/2024   BILITOT 0.9 08/24/2024   ALKPHOS 49 08/24/2024   AST 15 08/24/2024   ALT 17 08/24/2024   PROT 6.0 (L) 08/24/2024   ALBUMIN  3.1 (L) 08/24/2024   CALCIUM  9.5 09/08/2024   GFRAA >60 06/02/2015   QFTBGOLDPLUS NEGATIVE 11/26/2023    Speciality Comments: Cosentyx  started 01/12/2024  Procedures:  No procedures performed Allergies: Patient has no known allergies.   Assessment / Plan:     Visit Diagnoses:  Assessment & Plan Psoriatic arthritis (HCC)     Psoriasis     High risk medication use      ***  Follow-Up Instructions: No follow-ups on file.   Oyuki Hogan M Breckyn Troyer, CMA  Note - This record has been created using [employer] nutrition[employer].  Chart creation errors have been sought, but may not always  have been located. Such creation errors do not reflect on  the standard of medical care. "

## 2024-11-30 ENCOUNTER — Ambulatory Visit: Payer: Self-pay | Admitting: Family Medicine

## 2024-12-09 ENCOUNTER — Other Ambulatory Visit: Payer: Self-pay

## 2024-12-09 ENCOUNTER — Other Ambulatory Visit: Payer: Self-pay | Admitting: Internal Medicine

## 2024-12-09 DIAGNOSIS — L4050 Arthropathic psoriasis, unspecified: Secondary | ICD-10-CM

## 2024-12-09 DIAGNOSIS — Z79899 Other long term (current) drug therapy: Secondary | ICD-10-CM

## 2024-12-09 DIAGNOSIS — L409 Psoriasis, unspecified: Secondary | ICD-10-CM

## 2024-12-09 NOTE — Telephone Encounter (Signed)
 Last Fill: 09/07/2024  Labs: 08/24/2024 CMP-Sodium:132, Chloride:97,Glucose,Bld:116,Calcium :8.7, Total Protein:6.0,Albumin :3.1 CBC-Hemoglobin:12.1, HCT:37.4,Sodium:134,BUN:29, Creatinine,Ser:1.70  TB Gold: 11/26/2023 Negative   Next Visit: 12/19/2024  Last Visit: 09/07/2024  IK:Ednmpjupr arthritis   Current Dose per office note 09/07/2024:Cosentyx  300 mg [place] monthly   Okay to refill Cosentyx ?    LMOM to advise patient to update labs and advised lab hours.

## 2024-12-12 ENCOUNTER — Other Ambulatory Visit: Payer: Self-pay

## 2024-12-12 MED ORDER — COSENTYX UNOREADY 300 MG/2ML ~~LOC~~ SOAJ
300.0000 mg | SUBCUTANEOUS | 0 refills | Status: AC
  Filled 2024-12-12 (×2): qty 2, 28d supply, fill #0

## 2024-12-12 NOTE — Progress Notes (Signed)
 Specialty Pharmacy Refill Coordination Note  Jeffrey Black. is a 64 y.o. male contacted today regarding refills of specialty medication(s) Secukinumab  (Cosentyx  [place])   Patient requested Delivery   Delivery date: 12/20/24   Verified address: [address]  [place]   Medication will be filled on: 12/19/24

## 2024-12-13 ENCOUNTER — Ambulatory Visit: Admitting: Internal Medicine

## 2024-12-13 DIAGNOSIS — Z79899 Other long term (current) drug therapy: Secondary | ICD-10-CM

## 2024-12-13 DIAGNOSIS — L4050 Arthropathic psoriasis, unspecified: Secondary | ICD-10-CM

## 2024-12-13 DIAGNOSIS — L409 Psoriasis, unspecified: Secondary | ICD-10-CM

## 2024-12-15 ENCOUNTER — Other Ambulatory Visit: Payer: Self-pay

## 2024-12-15 NOTE — Progress Notes (Signed)
 Patient was contacted by the pharmacy regarding their specialty medication(s) Secukinumab  (Cosentyx  [place]) to reschedule a later delivery date, due to impending winter weather conditions. Medication(s) will be filled 12/21/24 for a delivery by 12/22/24.

## 2024-12-19 ENCOUNTER — Ambulatory Visit

## 2024-12-20 ENCOUNTER — Ambulatory Visit (HOSPITAL_COMMUNITY)

## 2024-12-21 ENCOUNTER — Other Ambulatory Visit: Payer: Self-pay

## 2024-12-28 ENCOUNTER — Ambulatory Visit: Admitting: Cardiology

## 2024-12-29 ENCOUNTER — Ambulatory Visit

## 2025-01-06 ENCOUNTER — Ambulatory Visit: Admitting: Family Medicine

## 2025-04-19 ENCOUNTER — Ambulatory Visit (HOSPITAL_COMMUNITY)

## 2025-04-25 ENCOUNTER — Ambulatory Visit: Admitting: Cardiology
